# Patient Record
Sex: Male | Born: 1965 | Race: White | Hispanic: No | Marital: Married | Smoking: Former smoker
Health system: Southern US, Community
[De-identification: ages and names within clinical notes are randomized; demographics above are authoritative.]

## PROBLEM LIST (undated history)

## (undated) DIAGNOSIS — N433 Hydrocele, unspecified: Secondary | ICD-10-CM

## (undated) DIAGNOSIS — Z8719 Personal history of other diseases of the digestive system: Secondary | ICD-10-CM

## (undated) DIAGNOSIS — K449 Diaphragmatic hernia without obstruction or gangrene: Secondary | ICD-10-CM

## (undated) DIAGNOSIS — I7300 Raynaud's syndrome without gangrene: Secondary | ICD-10-CM

## (undated) DIAGNOSIS — Z9889 Other specified postprocedural states: Secondary | ICD-10-CM

## (undated) DIAGNOSIS — K219 Gastro-esophageal reflux disease without esophagitis: Secondary | ICD-10-CM

## (undated) DIAGNOSIS — C221 Intrahepatic bile duct carcinoma: Secondary | ICD-10-CM

## (undated) DIAGNOSIS — I10 Essential (primary) hypertension: Secondary | ICD-10-CM

## (undated) HISTORY — PX: INGUINAL HERNIA REPAIR: SUR1180

---

## 2002-01-01 ENCOUNTER — Encounter: Payer: Self-pay | Admitting: Family Medicine

## 2002-01-01 ENCOUNTER — Encounter: Admission: RE | Admit: 2002-01-01 | Discharge: 2002-01-01 | Payer: Self-pay | Admitting: Family Medicine

## 2003-11-21 ENCOUNTER — Encounter (INDEPENDENT_AMBULATORY_CARE_PROVIDER_SITE_OTHER): Payer: Self-pay | Admitting: Specialist

## 2003-11-21 ENCOUNTER — Ambulatory Visit (HOSPITAL_COMMUNITY): Admission: RE | Admit: 2003-11-21 | Discharge: 2003-11-21 | Payer: Self-pay | Admitting: *Deleted

## 2004-07-09 ENCOUNTER — Encounter (INDEPENDENT_AMBULATORY_CARE_PROVIDER_SITE_OTHER): Payer: Self-pay | Admitting: Specialist

## 2004-07-09 ENCOUNTER — Ambulatory Visit (HOSPITAL_COMMUNITY): Admission: RE | Admit: 2004-07-09 | Discharge: 2004-07-09 | Payer: Self-pay | Admitting: *Deleted

## 2005-02-04 ENCOUNTER — Ambulatory Visit (HOSPITAL_COMMUNITY): Admission: RE | Admit: 2005-02-04 | Discharge: 2005-02-04 | Payer: Self-pay | Admitting: *Deleted

## 2005-02-04 ENCOUNTER — Encounter (INDEPENDENT_AMBULATORY_CARE_PROVIDER_SITE_OTHER): Payer: Self-pay | Admitting: Specialist

## 2006-06-09 ENCOUNTER — Ambulatory Visit (HOSPITAL_COMMUNITY): Admission: RE | Admit: 2006-06-09 | Discharge: 2006-06-09 | Payer: Self-pay | Admitting: *Deleted

## 2007-08-09 ENCOUNTER — Ambulatory Visit (HOSPITAL_COMMUNITY): Admission: RE | Admit: 2007-08-09 | Discharge: 2007-08-09 | Payer: Self-pay | Admitting: *Deleted

## 2007-08-09 ENCOUNTER — Encounter (INDEPENDENT_AMBULATORY_CARE_PROVIDER_SITE_OTHER): Payer: Self-pay | Admitting: *Deleted

## 2010-07-13 NOTE — Op Note (Signed)
NAMEPANFILO, Jeffrey Blevins                  ACCOUNT NO.:  [id number]   MEDICAL RECORD NO.:  [id number]          PATIENT TYPE:  AMB   LOCATION:  ENDO                         FACILITY:  [facility]   PHYSICIAN:  Georgiana Spinner, M.D.    DATE OF BIRTH:  25-Jan-1966   DATE OF PROCEDURE:  08/09/2007  DATE OF DISCHARGE:                               OPERATIVE REPORT   PROCEDURE:  Upper endoscopy with dilation and biopsy.   ENDOSCOPIST:  Georgiana Spinner, M.D.   INDICATIONS:  Dysphagia with previous history of esophageal stricture.   ANESTHESIA:  Fentanyl 125 mcg, Versed 12.5 mg, Benadryl 25 mg.   PROCEDURE:  With the patient mildly sedated in the left lateral  decubitus position, the Pentax videoscopic endoscope was inserted into  the mouth and passed under direct vision through the esophagus, which  appeared normal, until we reached the distal esophagus and there was a  clear-cut Barrett's, which was photographed, and advanced into the  stomach; fundus, body, antrum, duodenal bulb, and second portion of  duodenum were visualized.  From this point, the endoscope was slowly  withdrawn, taking circumferential views of duodenal mucosa until the  endoscope had been pulled back into the stomach and placed in  retroflexion to view the stomach from below.  The endoscope was  straightened and a guidewire was passed.  The endoscope was withdrawn.  Subsequently, a 17 Savary dilator was passed rather easily and then  removed with the guidewire.  The endoscope was reinserted.  A small  amount of blood was seen in the distal esophagus, indicating successful  dilation, and biopsies were taken as best we could; there was some blood  and the patient was only mildly sedated.  The endoscope was then  withdrawn.  The patient's vital signs and pulse oximetry remained  stable.  The patient tolerated the procedure well without apparent  complications.   FINDINGS:  Barrett's esophagus with esophageal narrowing, dilated to 17  Savary, biopsies taken.   PLAN:  Await biopsy reports and clinical response.  The patient will  call me for results and follow up with me as needed as an outpatient.           ______________________________  Georgiana Spinner, M.D.     GMO/MEDQ  D:  08/09/2007  T:  08/09/2007  Job:  [id number]

## 2010-07-16 NOTE — Op Note (Signed)
Jeffrey Blevins, Jeffrey Blevins                  ACCOUNT NO.:  [id number]   MEDICAL RECORD NO.:  [id number]          PATIENT TYPE:  AMB   LOCATION:  ENDO                         FACILITY:  [facility]   PHYSICIAN:  Georgiana Spinner, M.D.    DATE OF BIRTH:  1965/10/13   DATE OF PROCEDURE:  02/04/2005  DATE OF DISCHARGE:                                 OPERATIVE REPORT   PROCEDURE:  Upper endoscopy with biopsy.   INDICATIONS:  GERD with Barrett's esophagus.   ANESTHESIA:  Demerol 100, Versed 9 mg.   PROCEDURE:  With patient mildly sedated in the left lateral decubitus  position, the Olympus videoscopic endoscope was inserted in the mouth,  passed under direct vision through the esophagus which appeared normal until  we reached the distal esophagus, and there appeared to be a small area of  Barrett's, photographed and biopsied. We entered the stomach. Fundus, body,  antrum, duodenal bulb, second portion of duodenum all appeared normal. From  this point, the endoscope was slowly withdrawn taking circumferential views  of the duodenal mucosa until the endoscope had been pulled back into the  stomach and placed in retroflexion to view the stomach from below. The  endoscope was straightened and withdrawn taking circumferential views of the  remaining gastric and esophageal mucosa. The patient's vital signs and pulse  oximeter remained stable. The patient tolerated the procedure well without  apparent complications.   FINDINGS:  Barrett's esophagus, biopsied, await biopsy report. The patient  will call me for results and follow up with me as an outpatient.           ______________________________  Georgiana Spinner, M.D.     GMO/MEDQ  D:  02/04/2005  T:  02/04/2005  Job:  [id number]   cc:   Delaney Meigs, M.D.  Fax: [phone]

## 2010-07-16 NOTE — Op Note (Signed)
NAMEGIORDANO, Jeffrey Blevins                  ACCOUNT NO.:  [id number]   MEDICAL RECORD NO.:  [id number]          PATIENT TYPE:  AMB   LOCATION:  ENDO                         [facility]ACILITY[facility]   PHYSICIAN:  Georgiana Spinner, M.D.    DATE OF BIRTH:  November 26, 1965   DATE OF PROCEDURE:  DATE OF DISCHARGE:                                 OPERATIVE REPORT   PROCEDURE:  Upper endoscopy with biopsy.   INDICATIONS:  Barrett's esophagus, question of low grade glandular  dysplasia.   ANESTHESIA:  Demerol 90, Versed 9 mg.   DESCRIPTION OF PROCEDURE:  With the patient mildly sedated in the left  lateral decubitus position, the Olympus videoscopic endoscope was inserted  in the mouth and passed under direct vision through the esophagus and there  were areas of Barrett's photographed and biopsied. We then entered into the  stomach fundus, body, antrum, duodenal bulb, and second portion of the  duodenum were visualized. From this point, the endoscope was slowly  withdrawn taking circumferential views of duodenal mucosa until the  endoscope was then pulled back into the stomach placed in retroflexion to  view the stomach from below. The endoscope was straightened and withdrawn  taking circumferential views of the remaining gastric and esophageal mucosa.  The patient's vital signs and pulse oximeter remained stable. The patient  tolerated procedure well without complications.   FINDINGS:  Barrett's esophagus biopsied, incomplete wrap of the GE junction  around the endoscope indicating the laxity of the gastroesophageal junction  which leads to his reflux.   PLAN:  Await biopsy report. The patient will call me for results and follow-  up with me as an outpatient      GMO/MEDQ  D:  07/09/2004  T:  07/09/2004  Job:  [id number]   cc:   Delaney Meigs, M.D.  [address]  [place]  [place] [address]  Fax: [phone]

## 2010-07-16 NOTE — Op Note (Signed)
Jeffrey Blevins, Jeffrey Blevins                  ACCOUNT NO.:  [id number]   MEDICAL RECORD NO.:  [id number]          PATIENT TYPE:  AMB   LOCATION:  ENDO                         FACILITY:  [facility]   PHYSICIAN:  Georgiana Spinner, M.D.    DATE OF BIRTH:  01-20-1966   DATE OF PROCEDURE:  11/21/2003  DATE OF DISCHARGE:                                 OPERATIVE REPORT   PROCEDURE:  Upper endoscopy with biopsy and dilation.   INDICATIONS:  Dysphagia.   ANESTHESIA:  1.  Demerol 120 mg.  2.  Versed 12 mg.   DESCRIPTION OF PROCEDURE:  With patient mildly sedated in the left lateral  decubitus position, the Olympus videoscopic endoscope was inserted in the  mouth, passed under direct vision through the esophagus, which appeared  normal except for an irregular Z-line which we photographed and biopsied.  We entered into the stomach.  Fundus, body appeared normal.  Antrum showed  changes of possible erythema and some irregularity of the mucosa which was  photographed and biopsied.  Duodenal bulb and second portion of duodenum  appeared normal.  From this point, the endoscope was slowly withdrawn,  taking circumferential views of the duodenal mucosa until the endoscope then  pulled back into the stomach, placed in retroflexion to view the stomach  from below, and this showed a weak wrap of the GE junction around the  endoscope.  The endoscope was then straightened, and a guidewire was passed.  The endoscope was withdrawn.  Subsequently Savary dilators of 15 and 17 were  passed rather easily.  No blood was seen on either dilator.  With the  latter, the guidewire was removed.  The endoscope was reinserted and then  after biopsy again in the distal stomach and esophagus, the endoscope was  withdrawn, taking circumferential views of the remaining gastric and  esophageal mucosa.  The patient's vital signs and pulse oximeter remained  stable.  The patient tolerated the procedure well without apparent   complications.   FINDINGS:  Question of Barrett's esophagus, esophageal stricture dilated.   PLAN:  Await clinical response and biopsy report.  The patient will call me  for results and follow up with me as an outpatient.     GMO/MEDQ  D:  11/21/2003  T:  11/21/2003  Job:  [id number]   cc:   Delaney Meigs, M.D.  [address]  [place]  [place] [address]  Fax: [phone]

## 2011-12-07 ENCOUNTER — Other Ambulatory Visit: Payer: Self-pay | Admitting: Internal Medicine

## 2011-12-07 ENCOUNTER — Ambulatory Visit
Admission: RE | Admit: 2011-12-07 | Discharge: 2011-12-07 | Disposition: A | Discharge status: Home / Self Care | Payer: 59 | Source: Ambulatory Visit | Attending: Internal Medicine | Admitting: Internal Medicine

## 2019-07-16 HISTORY — PX: COLONOSCOPY WITH ESOPHAGOGASTRODUODENOSCOPY (EGD): SHX5779

## 2019-07-16 LAB — HM COLONOSCOPY

## 2020-09-16 ENCOUNTER — Other Ambulatory Visit: Payer: Self-pay | Admitting: Urology

## 2020-10-05 ENCOUNTER — Encounter (HOSPITAL_BASED_OUTPATIENT_CLINIC_OR_DEPARTMENT_OTHER): Payer: Self-pay | Admitting: Urology

## 2020-10-06 ENCOUNTER — Other Ambulatory Visit: Payer: Self-pay

## 2020-10-06 ENCOUNTER — Encounter (HOSPITAL_BASED_OUTPATIENT_CLINIC_OR_DEPARTMENT_OTHER): Payer: Self-pay | Admitting: Urology

## 2020-10-06 NOTE — Progress Notes (Signed)
Spoke w/ via phone for pre-op interview--- pt Lab needs dos----   Istat and EKG            Lab results------ no COVID test -----patient states asymptomatic no test needed Arrive at ------- 1030 on 10-09-2020 NPO after MN NO Solid Food.  Clear liquids from MN until--- 0930 Med rec completed Medications to take morning of surgery ----- Norvasc, Nexium Diabetic medication ----- n/a Patient instructed no nail polish to be worn day of surgery Patient instructed to bring photo id and insurance card day of surgery Patient aware to have Driver (ride ) / caregiver for 24 hours after surgery --wife, [facility] Patient Special Instructions ----- n/a Pre-Op special Istructions ----- n/a Patient verbalized understanding of instructions that were given at this phone interview. Patient denies shortness of breath, chest pain, fever, cough at this phone interview.

## 2020-10-08 NOTE — H&P (Signed)
Office Visit Report     09/07/2020   --------------------------------------------------------------------------------   Jeffrey Blevins. [place]  MRN: [id number]  DOB: November 14, 1965, 55 year old Male  SSN: [id number]   PRIMARY CARE:  Merrilee Seashore, MD  REFERRING:  Irene Pap, DO  PROVIDER:  Festus Aloe, M.D.  LOCATION:  [facility] [phone]     --------------------------------------------------------------------------------   CC/HPI: New pt -   1) right spermatocele poss hydrocele - pt was seen in 2013 when [place] was thought to reveal a 2.7 cm right spermatocele. This has slowly enlarged and now on [place] at [place] 07/06/2020 reports says a large right "hydrocele". It is botersome heavy and "pulls". It buries the penis some and makes voiding difficult. AUASS = 7.    2) peyronie's - penis curves up. Since 2020. Erection non-tender. He has noticed a plaque on the top. ALso has ED. He has trouble getting and maintaining erection. Never tried anything.    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     ALLERGIES: None   MEDICATIONS: Nexium 20 mg capsule,delayed release  Amlodipine Besylate 10 mg tablet  Ibuprofen  Irbesartan 150 mg tablet     GU PSH: None     PSH Notes: Hernia Repair   NON-GU PSH: Hernia Repair - 2013     GU PMH: Spermatocele of epididymis, Unspec, Spermatocele - 2015      PMH Notes:  2011-12-15 11:13:48 - Note: Arthritis   NON-GU PMH: Barrett''s esophagus without dysplasia, Barrett's Esophagus - 2014 Personal history of other diseases of the digestive system, History of esophageal reflux - 2014 Encounter for general adult medical examination without abnormal findings, Encounter for preventive health examination Hypertension    FAMILY HISTORY: Breast Cancer - Mother Death of family member - Mother, Father Diabetes - Runs In Family Heart Disease - Runs In Family stroke - Father    Notes: 2 sons   SOCIAL HISTORY: Marital Status: Married Preferred  Language: English; Ethnicity: Not Hispanic Or Latino; Race: White Current Smoking Status: Patient smokes. Has smoked since 08/29/2010. Smokes less than 1/2 pack per day.   Tobacco Use Assessment Completed: Used Tobacco in last 30 days? Does not use smokeless tobacco. Drinks 3 drinks per day.  Drinks 2 caffeinated drinks per day.     Notes: Former smoker, Alcohol Use, Caffeine Use   REVIEW OF SYSTEMS:    GU Review Male:   Patient reports get up at night to urinate and erection problems. Patient denies frequent urination, hard to postpone urination, burning/ pain with urination, leakage of urine, stream starts and stops, trouble starting your stream, have to strain to urinate , and penile pain.  Gastrointestinal (Upper):   Patient reports indigestion/ heartburn. Patient denies nausea and vomiting.  Gastrointestinal (Lower):   Patient denies diarrhea and constipation.  Constitutional:   Patient reports fatigue. Patient denies fever, night sweats, and weight loss.  Skin:   Patient reports skin rash/ lesion and itching.   Eyes:   Patient reports blurred vision. Patient denies double vision.  Ears/ Nose/ Throat:   Patient denies sore throat and sinus problems.  Hematologic/Lymphatic:   Patient denies swollen glands and easy bruising.  Cardiovascular:   Patient denies leg swelling and chest pains.  Respiratory:   Patient denies cough and shortness of breath.  Endocrine:   Patient denies excessive thirst.  Musculoskeletal:   Patient reports back pain and joint pain.   Neurological:   Patient denies headaches and dizziness.  Psychologic:   Patient  denies depression and anxiety.   VITAL SIGNS:      09/07/2020 02:46 PM  Weight 215 lb / 97.52 kg  Height 74 in / 187.96 cm  BP 165/89 mmHg  Pulse 60 /min  Temperature 97.5 F / 36.3 C  BMI 27.6 kg/m   GU PHYSICAL EXAMINATION:    Anus and Perineum: No hemorrhoids. No anal stenosis. No rectal fissure, no anal fissure. No edema, no dimple, no  perineal tenderness, no anal tenderness.  Scrotum: No lesions. No edema. No cysts. No warts.  Epididymides: Right: no spermatocele, no masses, no cysts, no tenderness, no induration, no enlargement. Left: no spermatocele, no masses, no cysts, no tenderness, no induration, no enlargement.  Testes: 5+ cm hydrocele right testis. No tenderness, no swelling, no enlargement left testis. No tenderness, no swelling, no enlargement right testis. Normal location left testis. Normal location right testis. No mass, no cyst, no varicocele, no hydrocele left testis. No mass, no cyst, no varicocele right testis.   Urethral Meatus: Normal size. No lesion, no wart, no discharge, no polyp. Normal location.  Penis: Circumcised, no warts, no cracks. No dorsal Peyronie's plaques, no left corporal Peyronie's plaques, no right corporal Peyronie's plaques, no scarring, no warts. No balanitis, no meatal stenosis.  Prostate: Prostate about 30 grams. Left lobe normal consistency, right lobe normal consistency. Symmetrical lobes. No prostate nodule. Left lobe no tenderness, right lobe no tenderness.   Seminal Vesicles: Nonpalpable.  Sphincter Tone: Normal sphincter. No rectal tenderness. No rectal mass.    MULTI-SYSTEM PHYSICAL EXAMINATION:    Constitutional: Well-nourished. No physical deformities. Normally developed. Good grooming.  Neck: Neck symmetrical, not swollen. Normal tracheal position.  Respiratory: No labored breathing, no use of accessory muscles.   Cardiovascular: Normal temperature, normal extremity pulses, no swelling, no varicosities.  Lymphatic: No enlargement of neck, axillae, groin.  Skin: No paleness, no jaundice, no cyanosis. No lesion, no ulcer, no rash.  Neurologic / Psychiatric: Oriented to time, oriented to place, oriented to person. No depression, no anxiety, no agitation.  Gastrointestinal: No mass, no tenderness, no rigidity, non obese abdomen.  Eyes: Normal conjunctivae. Normal eyelids.  Ears,  Nose, Mouth, and Throat: Left ear no scars, no lesions, no masses. Right ear no scars, no lesions, no masses. Nose no scars, no lesions, no masses. Normal hearing. Normal lips.  Musculoskeletal: Normal gait and station of head and neck.     Complexity of Data:  X-Ray Review: Scrotal Ultrasound: Reviewed Report. Discussed With Patient. 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 Discussed nature r/b/a to surgical repair and he elects to proceed.   2   ED due to arterial insufficiency - N52.01 Chronic, Stable - disc nature r/b/a to pde5i and he will start sildenafil.   3   Peyronies Disease - N48.6 Chronic, Stable - disc nature r/b/a to xiaflex and he'll consider    PLAN:            Medications New Meds: Sildenafil Citrate 20 mg tablet take 1 -5 tablets po about 1 hour before sexual activity   #30  3 Refill(s)            Schedule Return Visit/Planned Activity: Next Available Appointment - Schedule Surgery          Document Letter(s):  Created for Patient: Clinical  Summary         Notes:   cc: Dr. Ashby Dawes     * Signed by Festus Aloe, M.D. on 09/08/20 at 10:12 PM (EDT)*     The information contained in this medical record document is considered private and confidential patient information. This information can only be used for the medical diagnosis and/or medical services that are being provided by the patient's selected caregivers. This information can only be distributed outside of the patient's care if the patient agrees and signs waivers of authorization for this information to be sent to an outside source or route.

## 2020-10-09 ENCOUNTER — Ambulatory Visit (HOSPITAL_BASED_OUTPATIENT_CLINIC_OR_DEPARTMENT_OTHER): Payer: Managed Care, Other (non HMO) | Admitting: Certified Registered Nurse Anesthetist

## 2020-10-09 ENCOUNTER — Encounter (HOSPITAL_BASED_OUTPATIENT_CLINIC_OR_DEPARTMENT_OTHER)
Admission: RE | Disposition: A | Discharge status: Home / Self Care | Payer: Self-pay | Source: Home / Self Care | Attending: Urology

## 2020-10-09 ENCOUNTER — Encounter (HOSPITAL_BASED_OUTPATIENT_CLINIC_OR_DEPARTMENT_OTHER): Payer: Self-pay | Admitting: Urology

## 2020-10-09 ENCOUNTER — Other Ambulatory Visit: Payer: Self-pay

## 2020-10-09 ENCOUNTER — Ambulatory Visit (HOSPITAL_BASED_OUTPATIENT_CLINIC_OR_DEPARTMENT_OTHER)
Admission: RE | Admit: 2020-10-09 | Discharge: 2020-10-09 | Disposition: A | Discharge status: Home / Self Care | Payer: Managed Care, Other (non HMO) | Attending: Urology | Admitting: Urology

## 2020-10-09 DIAGNOSIS — F17200 Nicotine dependence, unspecified, uncomplicated: Secondary | ICD-10-CM | POA: Diagnosis not present

## 2020-10-09 DIAGNOSIS — N4342 Spermatocele of epididymis, multiple: Secondary | ICD-10-CM

## 2020-10-09 DIAGNOSIS — K219 Gastro-esophageal reflux disease without esophagitis: Secondary | ICD-10-CM | POA: Diagnosis not present

## 2020-10-09 DIAGNOSIS — Z79899 Other long term (current) drug therapy: Secondary | ICD-10-CM | POA: Diagnosis not present

## 2020-10-09 DIAGNOSIS — Z833 Family history of diabetes mellitus: Secondary | ICD-10-CM | POA: Diagnosis not present

## 2020-10-09 DIAGNOSIS — Z803 Family history of malignant neoplasm of breast: Secondary | ICD-10-CM | POA: Insufficient documentation

## 2020-10-09 DIAGNOSIS — N4341 Spermatocele of epididymis, single: Secondary | ICD-10-CM | POA: Insufficient documentation

## 2020-10-09 DIAGNOSIS — Z8249 Family history of ischemic heart disease and other diseases of the circulatory system: Secondary | ICD-10-CM | POA: Insufficient documentation

## 2020-10-09 DIAGNOSIS — Z791 Long term (current) use of non-steroidal anti-inflammatories (NSAID): Secondary | ICD-10-CM | POA: Diagnosis not present

## 2020-10-09 HISTORY — DX: Essential (primary) hypertension: I10

## 2020-10-09 HISTORY — DX: Hydrocele, unspecified: N43.3

## 2020-10-09 HISTORY — DX: Personal history of other diseases of the digestive system: Z87.19

## 2020-10-09 HISTORY — DX: Gastro-esophageal reflux disease without esophagitis: K21.9

## 2020-10-09 HISTORY — DX: Diaphragmatic hernia without obstruction or gangrene: K44.9

## 2020-10-09 HISTORY — DX: Other specified postprocedural states: Z98.890

## 2020-10-09 HISTORY — DX: Raynaud's syndrome without gangrene: I73.00

## 2020-10-09 HISTORY — PX: SPERMATOCELECTOMY: SHX2420

## 2020-10-09 LAB — POCT I-STAT, CHEM 8
BUN: 15 mg/dL (ref 6–20)
Calcium, Ion: 1.24 mmol/L (ref 1.15–1.40)
Chloride: 106 mmol/L (ref 98–111)
Creatinine, Ser: 1 mg/dL (ref 0.61–1.24)
Glucose, Bld: 91 mg/dL (ref 70–99)
HCT: 45 % (ref 39.0–52.0)
Hemoglobin: 15.3 g/dL (ref 13.0–17.0)
Potassium: 4 mmol/L (ref 3.5–5.1)
Sodium: 140 mmol/L (ref 135–145)
TCO2: 24 mmol/L (ref 22–32)

## 2020-10-09 SURGERY — EXCISION, SPERMATOCELE
Anesthesia: General | Site: Scrotum | Laterality: Right

## 2020-10-09 MED ORDER — ACETAMINOPHEN 160 MG/5ML PO SOLN: 325.0000 mg | ORAL | Status: DC | PRN

## 2020-10-09 MED ORDER — OXYCODONE HCL 5 MG PO TABS: 5.0000 mg | ORAL_TABLET | Freq: Once | ORAL | Status: DC | PRN

## 2020-10-09 MED ORDER — DEXAMETHASONE SODIUM PHOSPHATE 10 MG/ML IJ SOLN
INTRAMUSCULAR | Status: AC
  Filled 2020-10-09: qty 1

## 2020-10-09 MED ORDER — CEFAZOLIN SODIUM-DEXTROSE 2-4 GM/100ML-% IV SOLN
INTRAVENOUS | Status: AC
  Filled 2020-10-09: qty 100

## 2020-10-09 MED ORDER — TRAMADOL HCL 50 MG PO TABS: 50.0000 mg | ORAL_TABLET | Freq: Four times a day (QID) | ORAL | 0 refills | Status: AC | PRN

## 2020-10-09 MED ORDER — BACITRACIN ZINC 500 UNIT/GM EX OINT
TOPICAL_OINTMENT | CUTANEOUS | Status: DC | PRN
  Administered 2020-10-09: 1 via TOPICAL

## 2020-10-09 MED ORDER — EPHEDRINE SULFATE 50 MG/ML IJ SOLN
INTRAMUSCULAR | Status: DC | PRN
  Administered 2020-10-09: 10 mg via INTRAVENOUS

## 2020-10-09 MED ORDER — 0.9 % SODIUM CHLORIDE (POUR BTL) OPTIME
TOPICAL | Status: DC | PRN
  Administered 2020-10-09: 500 mL

## 2020-10-09 MED ORDER — LIDOCAINE HCL (PF) 2 % IJ SOLN
INTRAMUSCULAR | Status: AC
  Filled 2020-10-09: qty 5

## 2020-10-09 MED ORDER — PROPOFOL 10 MG/ML IV BOLUS
INTRAVENOUS | Status: AC
  Filled 2020-10-09: qty 20

## 2020-10-09 MED ORDER — ONDANSETRON HCL 4 MG/2ML IJ SOLN
INTRAMUSCULAR | Status: DC | PRN
  Administered 2020-10-09: 4 mg via INTRAVENOUS

## 2020-10-09 MED ORDER — FENTANYL CITRATE (PF) 100 MCG/2ML IJ SOLN
INTRAMUSCULAR | Status: DC | PRN
  Administered 2020-10-09 (×2): 50 ug via INTRAVENOUS

## 2020-10-09 MED ORDER — CEFAZOLIN SODIUM-DEXTROSE 2-4 GM/100ML-% IV SOLN
2.0000 g | INTRAVENOUS | Status: AC
  Administered 2020-10-09: 2 g via INTRAVENOUS

## 2020-10-09 MED ORDER — AMISULPRIDE (ANTIEMETIC) 5 MG/2ML IV SOLN
10.0000 mg | Freq: Once | INTRAVENOUS | Status: DC | PRN
Start: 2020-10-09 — End: 2020-10-09

## 2020-10-09 MED ORDER — MIDAZOLAM HCL 2 MG/2ML IJ SOLN
INTRAMUSCULAR | Status: AC
  Filled 2020-10-09: qty 2

## 2020-10-09 MED ORDER — PROPOFOL 10 MG/ML IV BOLUS
INTRAVENOUS | Status: DC | PRN
Start: 2020-10-09 — End: 2020-10-09
  Administered 2020-10-09: 150 mg via INTRAVENOUS

## 2020-10-09 MED ORDER — FENTANYL CITRATE (PF) 100 MCG/2ML IJ SOLN: 25.0000 ug | INTRAMUSCULAR | Status: DC | PRN

## 2020-10-09 MED ORDER — LIDOCAINE HCL (PF) 1 % IJ SOLN
INTRAMUSCULAR | Status: DC | PRN
  Administered 2020-10-09: 3.5 mL

## 2020-10-09 MED ORDER — ACETAMINOPHEN 10 MG/ML IV SOLN: 1000.0000 mg | Freq: Once | INTRAVENOUS | Status: DC | PRN

## 2020-10-09 MED ORDER — ONDANSETRON HCL 4 MG/2ML IJ SOLN
INTRAMUSCULAR | Status: AC
  Filled 2020-10-09: qty 2

## 2020-10-09 MED ORDER — GLYCOPYRROLATE PF 0.2 MG/ML IJ SOSY
PREFILLED_SYRINGE | INTRAMUSCULAR | Status: AC
  Filled 2020-10-09: qty 1

## 2020-10-09 MED ORDER — LIDOCAINE HCL (CARDIAC) PF 100 MG/5ML IV SOSY
PREFILLED_SYRINGE | INTRAVENOUS | Status: DC | PRN
Start: 2020-10-09 — End: 2020-10-09
  Administered 2020-10-09: 50 mg via INTRAVENOUS

## 2020-10-09 MED ORDER — LACTATED RINGERS IV SOLN: INTRAVENOUS | Status: DC

## 2020-10-09 MED ORDER — ACETAMINOPHEN 325 MG PO TABS: 325.0000 mg | ORAL_TABLET | ORAL | Status: DC | PRN

## 2020-10-09 MED ORDER — DEXAMETHASONE SODIUM PHOSPHATE 4 MG/ML IJ SOLN
INTRAMUSCULAR | Status: DC | PRN
  Administered 2020-10-09: 10 mg via INTRAVENOUS

## 2020-10-09 MED ORDER — MIDAZOLAM HCL 5 MG/5ML IJ SOLN
INTRAMUSCULAR | Status: DC | PRN
  Administered 2020-10-09: 2 mg via INTRAVENOUS

## 2020-10-09 MED ORDER — OXYCODONE HCL 5 MG/5ML PO SOLN: 5.0000 mg | Freq: Once | ORAL | Status: DC | PRN

## 2020-10-09 MED ORDER — EPHEDRINE 5 MG/ML INJ
INTRAVENOUS | Status: AC
  Filled 2020-10-09: qty 5

## 2020-10-09 MED ORDER — FENTANYL CITRATE (PF) 100 MCG/2ML IJ SOLN
INTRAMUSCULAR | Status: AC
  Filled 2020-10-09: qty 2

## 2020-10-09 MED ORDER — PROMETHAZINE HCL 25 MG/ML IJ SOLN: 6.2500 mg | INTRAMUSCULAR | Status: DC | PRN

## 2020-10-09 MED ORDER — BUPIVACAINE-EPINEPHRINE 0.5% -1:200000 IJ SOLN
INTRAMUSCULAR | Status: DC | PRN
  Administered 2020-10-09: 3.5 mL
  Administered 2020-10-09: 10 mL

## 2020-10-09 SURGICAL SUPPLY — 32 items
BLADE SURG 15 STRL LF DISP TIS (BLADE) ×2 IMPLANT
BLADE SURG 15 STRL SS (BLADE) ×3
BNDG GAUZE ELAST 4 BULKY (GAUZE/BANDAGES/DRESSINGS) ×3 IMPLANT
COVER BACK TABLE 60X90IN (DRAPES) ×3 IMPLANT
COVER MAYO STAND STRL (DRAPES) ×3 IMPLANT
DRAPE LAPAROTOMY 100X72 PEDS (DRAPES) ×3 IMPLANT
DRSG TELFA 3X8 NADH (GAUZE/BANDAGES/DRESSINGS) ×3 IMPLANT
ELECT REM PT RETURN 9FT ADLT (ELECTROSURGICAL) ×3
ELECTRODE REM PT RTRN 9FT ADLT (ELECTROSURGICAL) ×2 IMPLANT
GAUZE 4X4 16PLY ~~LOC~~+RFID DBL (SPONGE) ×3 IMPLANT
GLOVE SURG ENC TEXT LTX SZ7.5 (GLOVE) ×6 IMPLANT
GLOVE SURG UNDER POLY LF SZ6.5 (GLOVE) ×3 IMPLANT
GLOVE SURG UNDER POLY LF SZ7 (GLOVE) ×6 IMPLANT
GOWN STRL REUS W/TWL LRG LVL3 (GOWN DISPOSABLE) ×9 IMPLANT
GOWN STRL REUS W/TWL XL LVL3 (GOWN DISPOSABLE) ×3 IMPLANT
KIT TURNOVER CYSTO (KITS) ×3 IMPLANT
NEEDLE HYPO 22GX1.5 SAFETY (NEEDLE) ×3 IMPLANT
NS IRRIG 500ML POUR BTL (IV SOLUTION) ×3 IMPLANT
PACK BASIN DAY SURGERY FS (CUSTOM PROCEDURE TRAY) ×3 IMPLANT
PENCIL SMOKE EVACUATOR (MISCELLANEOUS) ×3 IMPLANT
SPONGE T-LAP 4X18 ~~LOC~~+RFID (SPONGE) ×3 IMPLANT
SUPPORTER ATHLETIC XL (MISCELLANEOUS) ×3
SUPPORTER ATHLETIC XL 3X44-50X (MISCELLANEOUS) ×2 IMPLANT
SUT CHROMIC 3 0 SH 27 (SUTURE) ×3 IMPLANT
SUT VIC AB 2-0 SH 27 (SUTURE) ×6
SUT VIC AB 2-0 SH 27XBRD (SUTURE) ×4 IMPLANT
SYR BULB IRRIG 60ML STRL (SYRINGE) ×3 IMPLANT
SYR CONTROL 10ML LL (SYRINGE) ×3 IMPLANT
TOWEL OR 17X26 10 PK STRL BLUE (TOWEL DISPOSABLE) ×3 IMPLANT
TRAY DSU PREP LF (CUSTOM PROCEDURE TRAY) ×3 IMPLANT
TUBE CONNECTING 12X1/4 (SUCTIONS) ×3 IMPLANT
YANKAUER SUCT BULB TIP NO VENT (SUCTIONS) ×3 IMPLANT

## 2020-10-09 NOTE — Op Note (Addendum)
Preoperative Diagnosis: Right Hydrocele vs Spermatocele  Postoperative Diagnosis:  Right Spermatocele  Procedure(s) Performed:  1. Right Spermatocelectomy  Surgeon:  Festus Aloe, MD  Resident Surgeon:  Bishop Limbo, MD  Anesthesia:  General   Fluids:  See anesthesia record  Estimated blood loss:  5 mL  Specimens:  right spermatocele sac  Cultures:  None  Drains:  None   Complications:  None   Indications: This is a 55 y.o. patient with history of right spermatocele versus hydrocele that has been enlarging and becoming increasingly bothersome to the patient. After discussion of the risks & benefits and alternatives to surgical approach, the patient wishes to proceed with right hydrocelectomy versus spermatocelectomy.    Findings: Right spermatocele containing 300cc serous clear fluid. Disseected free off of the right testicle and epididymis. Right epididymis reapparoixmated to superior testicle. No drain left in place.   Description:  The patient was correctly identified in the preop holding area where written informed consent as well potential risk and complication reviewed. The patient agreed and was brought back to the operative suite where a preinduction timeout was performed. Once correct information was verified, general anesthesia was induced . The patient was then gently placed into supine position with SCDs in place for VTE prophylaxis. They were prepped and draped in the usual sterile fashion and given appropriate preoperative antibiotics with ancef. A second timeout was then performed.   A 5cm right hemi-scrotal incision was made after instillation of marcaine with epi/lidocaine plain. Using a combination of blunt dissection and Bovie electrocautery we dissected through the dartos layer until the right hydrocele and testicle were delivered. The remainder of the surrounding fascia was bluntly dissected off of the hydrocele sac. The tunica vaginalis (hydrocele sac) was  opened which exposed a spermatocele. The spermatocele was attached at the head of the testicle and was carefully dissected off of the testicle, epididymis and the spermatic cord posteriorly using right angle and bovie electrocautery take achieve hemostasis along the way. Once the spermatocele was dissected free, it was drained with drainage of approximately 300cc fluid. The tunica vaginalis edges were fulgurated for hemostasis. The epidymis was reapproximated to the testicle using a 2-0 vicryl running suture. The remaining hydrocele sac was everted over the cord with running 2-0 vicryl using the Jabouley technique. The remainder of the surgical field was thoroughly inspected and meticulous hemostasis was attained. The surgical field was irrigated. The right testicle was replaced into the scrotum in the correct anatomic position. We elected not to leave a surgical drain due to excellent hemostasis. About 4 cc marcaine with epi/lidocaine plain was injected around the cord and about 5 more cc along the skin edges. The dartos layer was closed with running 3-0 chromic. The scrotal skin was closed with running 3-0 chromic in horizontal mattress fashion. Bacitracin was applied. The scrotum was dressed with telfa, fluff gauze and scrotal support. The patient was woken up from the procedure and taken to the recovery unit for routine postoperative care. All surgical counts were correct x2.    Post Op Plan:   1. Discharge patient home when meets PACU criteria . 2. Prn analgesics 3. Scrotal support 4. Follow up arranged two weeks post op visit  I was present and scrubbed for the entire procedure.

## 2020-10-09 NOTE — Anesthesia Postprocedure Evaluation (Signed)
Anesthesia Post Note  Patient: Jeffrey Blevins  Procedure(s) Performed: SPERMATOCELECTOMY (Right: Scrotum)     Patient location during evaluation: PACU Anesthesia Type: General Level of consciousness: awake and alert Pain management: pain level controlled Vital Signs Assessment: post-procedure vital signs reviewed and stable Respiratory status: spontaneous breathing, nonlabored ventilation, respiratory function stable and patient connected to nasal cannula oxygen Cardiovascular status: blood pressure returned to baseline and stable Postop Assessment: no apparent nausea or vomiting Anesthetic complications: no   No notable events documented.  Last Vitals:  Vitals:   10/09/20 1415 10/09/20 1430  BP: (!) 146/94 125/67  Pulse: 69 67  Resp: 10 14  Temp:  36.4 C  SpO2: 100% 100%    Last Pain:  Vitals:   10/09/20 1430  TempSrc:   PainSc: 0-No pain                 Effie Berkshire

## 2020-10-09 NOTE — Interval H&P Note (Signed)
History and Physical Interval Note:  10/09/2020 12:22 PM  Jeffrey Blevins  has presented today for surgery, with the diagnosis of RIGHT HYDROCELE.  The various methods of treatment have been discussed with the patient and family. After consideration of risks, benefits and other options for treatment, the patient has consented to  Procedure(s): HYDROCELECTOMY ADULT (Right) as a surgical intervention.  The patient's history has been reviewed, patient examined, no change in status, stable for surgery.  I have reviewed the patient's chart and labs.  Questions were answered to the patient's satisfaction.  Discussed again spermatocele vs hydrocele - similar approach and closure, recovery. He is otherwise well. He elects to proceed.    Festus Aloe

## 2020-10-09 NOTE — Transfer of Care (Signed)
Immediate Anesthesia Transfer of Care Note  Patient: Jeffrey Blevins  Procedure(s) Performed: SPERMATOCELECTOMY (Right: Scrotum)  Patient Location: PACU  Anesthesia Type:General  Level of Consciousness: awake, alert  and oriented  Airway & Oxygen Therapy: Patient Spontanous Breathing and Patient connected to nasal cannula oxygen  Post-op Assessment: Report given to RN and Post -op Vital signs reviewed and stable  Post vital signs: Reviewed and stable  Last Vitals:  Vitals Value Taken Time  BP 135/90 10/09/20 1411  Temp    Pulse 78 10/09/20 1412  Resp 14 10/09/20 1412  SpO2 100 % 10/09/20 1412  Vitals shown include unvalidated device data.  Last Pain:  Vitals:   10/09/20 1028  TempSrc: Oral  PainSc: 0-No pain      Patients Stated Pain Goal: 4 ([id number])  Complications: No notable events documented.

## 2020-10-09 NOTE — Anesthesia Procedure Notes (Signed)
Procedure Name: LMA Insertion Date/Time: 10/09/2020 12:55 PM Performed by: Bufford Spikes, CRNA Pre-anesthesia Checklist: Patient identified, Emergency Drugs available, Suction available and Patient being monitored Patient Re-evaluated:Patient Re-evaluated prior to induction Oxygen Delivery Method: Circle system utilized Preoxygenation: Pre-oxygenation with 100% oxygen Induction Type: IV induction Ventilation: Mask ventilation without difficulty LMA: LMA inserted LMA Size: 5.0 Number of attempts: 1 Placement Confirmation: positive ETCO2 Tube secured with: Tape Dental Injury: Teeth and Oropharynx as per pre-operative assessment

## 2020-10-09 NOTE — Anesthesia Preprocedure Evaluation (Addendum)
Anesthesia Evaluation  Patient identified by MRN, date of birth, ID band Patient awake    Reviewed: Allergy & Precautions, NPO status , Patient's Chart, lab work & pertinent test results  Airway Mallampati: I  TM Distance: >3 FB Neck ROM: Full    Dental  (+) Teeth Intact, Dental Advisory Given   Pulmonary Current Smoker and Patient abstained from smoking.,    breath sounds clear to auscultation       Cardiovascular hypertension,  Rhythm:Regular Rate:Normal     Neuro/Psych    GI/Hepatic Neg liver ROS, hiatal hernia, GERD  ,  Endo/Other  negative endocrine ROS  Renal/GU negative Renal ROS     Musculoskeletal negative musculoskeletal ROS (+)   Abdominal Normal abdominal exam  (+)   Peds  Hematology negative hematology ROS (+)   Anesthesia Other Findings   Reproductive/Obstetrics                            Anesthesia Physical Anesthesia Plan  ASA: 2  Anesthesia Plan: General   Post-op Pain Management:    Induction: Intravenous  PONV Risk Score and Plan: 2 and Ondansetron, Dexamethasone and Midazolam  Airway Management Planned: LMA  Additional Equipment: None  Intra-op Plan:   Post-operative Plan: Extubation in OR  Informed Consent: I have reviewed the patients History and Physical, chart, labs and discussed the procedure including the risks, benefits and alternatives for the proposed anesthesia with the patient or authorized representative who has indicated his/her understanding and acceptance.     Dental advisory given  Plan Discussed with: CRNA  Anesthesia Plan Comments:        Anesthesia Quick Evaluation

## 2020-10-09 NOTE — Discharge Instructions (Addendum)
Activity:  You are encouraged to ambulate frequently (about every hour during waking hours) to help prevent blood clots from forming in your legs or lungs.  However, you should not engage in any heavy lifting (> 10-15 lbs), strenuous activity, or straining for 4 weeks. Keep the scrotal support on for 48 hours, and wear tight fitting underwear thereafter for 1 week.   Diet: Regular diet  Prescriptions:  You will be provided a prescription for pain medication to take as needed.  If your pain is not severe enough to require the prescription pain medication, you may take extra strength Tylenol instead which will have less side effects. Ice works very well also.  You should also take a prescribed stool softener to avoid straining with bowel movements as the prescription pain medication may constipate you.  Incisions: The skin glue will fall off on its own in 1-2 weeks. The sutures underneath are absorbable. You may start showering (but not soaking or bathing in water) the 2nd day after surgery and the incisions simply need to be patted dry after the shower.  No additional care is needed.  What to call us about: You should call the office [phone] if you develop fever > 101 or develop persistent vomiting. Call if you have significant bleeding from your incision, or large swelling of the scrotum.     Post Anesthesia Home Care Instructions  Activity: Get plenty of rest for the remainder of the day. A responsible individual must stay with you for 24 hours following the procedure.  For the next 24 hours, DO NOT: -Drive a car -Paediatric nurse -Drink alcoholic beverages -Take any medication unless instructed by your physician -Make any legal decisions or sign important papers.  Meals: Start with liquid foods such as gelatin or soup. Progress to regular foods as tolerated. Avoid greasy, spicy, heavy foods. If nausea and/or vomiting occur, drink only clear liquids until the nausea and/or vomiting  subsides. Call your physician if vomiting continues.  Special Instructions/Symptoms: Your throat may feel dry or sore from the anesthesia or the breathing tube placed in your throat during surgery. If this causes discomfort, gargle with warm salt water. The discomfort should disappear within 24 hours.

## 2020-10-12 ENCOUNTER — Encounter (HOSPITAL_BASED_OUTPATIENT_CLINIC_OR_DEPARTMENT_OTHER): Payer: Self-pay | Admitting: Urology

## 2020-10-23 NOTE — H&P (Signed)
H&P  Chief Complaint: right hydrocele vs spermatocele   History of Present Illness:   1) right spermatocele poss hydrocele - pt was seen in 2013 when [place] was thought to reveal a 2.7 cm right spermatocele. This has slowly enlarged and now on [place] at [facility] 07/06/2020 reports says a large right "hydrocele". It is botersome heavy and "pulls". It buries the penis some and makes voiding difficult. AUASS = 7.      2) peyronie's - penis curves up. Since 2020. Erection non-tender. He has noticed a plaque on the top. ALso has ED. He has trouble getting and maintaining erection. Never tried anything.      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   Past Medical History:  Diagnosis Date   GERD (gastroesophageal reflux disease)    Hiatal hernia    History of Barrett's esophagus    per pt dx yrs ago, but with last egd none noted 05/ 2021   History of esophageal dilatation    per pt hx several times last one approx. 2009 then 05/ 2021  for stricture   Hypertension    followed by pcp   Raynauds phenomenon    Right hydrocele    Past Surgical History:  Procedure Laterality Date   COLONOSCOPY WITH ESOPHAGOGASTRODUODENOSCOPY (EGD)  07/16/2019   last one   INGUINAL HERNIA REPAIR Bilateral    last one 1990s   SPERMATOCELECTOMY Right 10/09/2020   Procedure: SPERMATOCELECTOMY;  Surgeon: Festus Aloe, MD;  Location: [facility];  Service: Urology;  Laterality: Right;    Home Medications:  No medications prior to admission.   Allergies:  Allergies  Allergen Reactions   Codeine Nausea And Vomiting and Rash   Tylenol [Acetaminophen] Rash    History reviewed. No pertinent family history. Social History:  reports that he has been smoking cigarettes. He has never used smokeless tobacco. He reports current alcohol use. He reports that he does not use drugs.  ROS: A complete review of systems was performed.  All systems are negative except for pertinent findings as noted. Review of Systems   All other systems reviewed and are negative.   Physical Exam:  Vital signs in last 24 hours:  GU PHYSICAL EXAMINATION:    Anus and Perineum: No hemorrhoids. No anal stenosis. No rectal fissure, no anal fissure. No edema, no dimple, no perineal tenderness, no anal tenderness.  Scrotum: No lesions. No edema. No cysts. No warts.  Epididymides: Right: no spermatocele, no masses, no cysts, no tenderness, no induration, no enlargement. Left: no spermatocele, no masses, no cysts, no tenderness, no induration, no enlargement.  Testes: 5+ cm hydrocele right testis. No tenderness, no swelling, no enlargement left testis. No tenderness, no swelling, no enlargement right testis. Normal location left testis. Normal location right testis. No mass, no cyst, no varicocele, no hydrocele left testis. No mass, no cyst, no varicocele right testis.   Urethral Meatus: Normal size. No lesion, no wart, no discharge, no polyp. Normal location.  Penis: Circumcised, no warts, no cracks. No dorsal Peyronie's plaques, no left corporal Peyronie's plaques, no right corporal Peyronie's plaques, no scarring, no warts. No balanitis, no meatal stenosis.  Prostate: Prostate about 30 grams. Left lobe normal consistency, right lobe normal consistency. Symmetrical lobes. No prostate nodule. Left lobe no tenderness, right lobe no tenderness.   Seminal Vesicles: Nonpalpable.  Sphincter Tone: Normal sphincter. No rectal tenderness. No rectal mass.    Laboratory Data:  No results found for this or any previous visit (  from the past 24 hour(s)). No results found for this or any previous visit (from the past 240 hour(s)). Creatinine: No results for input(s): CREATININE in the last 168 hours.  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 Discussed nature r/b/a to surgical repair and he elects to proceed.  Discussed  again spermatocele vs hydrocele - similar approach and closure, recovery. He is otherwise well. He elects to proceed.  2   ED due to arterial insufficiency - N52.01 Chronic, Stable - disc nature r/b/a to pde5i and he will start sildenafil.   3   Peyronies Disease - N48.6 Chronic, Stable - disc nature r/b/a to xiaflex and he'll consider    Festus Aloe 10/23/2020, 5:15 PM

## 2021-08-13 ENCOUNTER — Other Ambulatory Visit: Payer: Self-pay

## 2021-08-13 ENCOUNTER — Emergency Department (HOSPITAL_BASED_OUTPATIENT_CLINIC_OR_DEPARTMENT_OTHER): Payer: Managed Care, Other (non HMO)

## 2021-08-13 ENCOUNTER — Encounter (HOSPITAL_BASED_OUTPATIENT_CLINIC_OR_DEPARTMENT_OTHER): Payer: Self-pay | Admitting: Emergency Medicine

## 2021-08-13 ENCOUNTER — Inpatient Hospital Stay (HOSPITAL_BASED_OUTPATIENT_CLINIC_OR_DEPARTMENT_OTHER)
Admission: EM | Admit: 2021-08-13 | Discharge: 2021-08-26 | DRG: 445 | Disposition: A | Discharge status: Home / Self Care | Payer: Managed Care, Other (non HMO) | Attending: Internal Medicine | Admitting: Internal Medicine

## 2021-08-13 DIAGNOSIS — K449 Diaphragmatic hernia without obstruction or gangrene: Secondary | ICD-10-CM | POA: Diagnosis present

## 2021-08-13 DIAGNOSIS — B952 Enterococcus as the cause of diseases classified elsewhere: Secondary | ICD-10-CM | POA: Diagnosis present

## 2021-08-13 DIAGNOSIS — R17 Unspecified jaundice: Principal | ICD-10-CM

## 2021-08-13 DIAGNOSIS — F17210 Nicotine dependence, cigarettes, uncomplicated: Secondary | ICD-10-CM | POA: Diagnosis present

## 2021-08-13 DIAGNOSIS — Z720 Tobacco use: Secondary | ICD-10-CM | POA: Diagnosis present

## 2021-08-13 DIAGNOSIS — Z803 Family history of malignant neoplasm of breast: Secondary | ICD-10-CM

## 2021-08-13 DIAGNOSIS — E860 Dehydration: Secondary | ICD-10-CM | POA: Diagnosis present

## 2021-08-13 DIAGNOSIS — K831 Obstruction of bile duct: Principal | ICD-10-CM | POA: Diagnosis present

## 2021-08-13 DIAGNOSIS — Z886 Allergy status to analgesic agent status: Secondary | ICD-10-CM

## 2021-08-13 DIAGNOSIS — E861 Hypovolemia: Secondary | ICD-10-CM | POA: Diagnosis not present

## 2021-08-13 DIAGNOSIS — Z8249 Family history of ischemic heart disease and other diseases of the circulatory system: Secondary | ICD-10-CM

## 2021-08-13 DIAGNOSIS — Z885 Allergy status to narcotic agent status: Secondary | ICD-10-CM

## 2021-08-13 DIAGNOSIS — I7300 Raynaud's syndrome without gangrene: Secondary | ICD-10-CM | POA: Diagnosis present

## 2021-08-13 DIAGNOSIS — F419 Anxiety disorder, unspecified: Secondary | ICD-10-CM | POA: Diagnosis not present

## 2021-08-13 DIAGNOSIS — Z823 Family history of stroke: Secondary | ICD-10-CM

## 2021-08-13 DIAGNOSIS — R1901 Right upper quadrant abdominal swelling, mass and lump: Secondary | ICD-10-CM

## 2021-08-13 DIAGNOSIS — E871 Hypo-osmolality and hyponatremia: Secondary | ICD-10-CM | POA: Diagnosis present

## 2021-08-13 DIAGNOSIS — K760 Fatty (change of) liver, not elsewhere classified: Secondary | ICD-10-CM | POA: Diagnosis present

## 2021-08-13 DIAGNOSIS — C221 Intrahepatic bile duct carcinoma: Secondary | ICD-10-CM | POA: Diagnosis present

## 2021-08-13 DIAGNOSIS — N401 Enlarged prostate with lower urinary tract symptoms: Secondary | ICD-10-CM | POA: Diagnosis present

## 2021-08-13 DIAGNOSIS — E876 Hypokalemia: Secondary | ICD-10-CM | POA: Diagnosis not present

## 2021-08-13 DIAGNOSIS — K5900 Constipation, unspecified: Secondary | ICD-10-CM | POA: Diagnosis not present

## 2021-08-13 DIAGNOSIS — N179 Acute kidney failure, unspecified: Secondary | ICD-10-CM | POA: Diagnosis present

## 2021-08-13 DIAGNOSIS — K219 Gastro-esophageal reflux disease without esophagitis: Secondary | ICD-10-CM | POA: Diagnosis present

## 2021-08-13 DIAGNOSIS — K5730 Diverticulosis of large intestine without perforation or abscess without bleeding: Secondary | ICD-10-CM | POA: Diagnosis present

## 2021-08-13 DIAGNOSIS — N400 Enlarged prostate without lower urinary tract symptoms: Secondary | ICD-10-CM | POA: Diagnosis present

## 2021-08-13 DIAGNOSIS — I10 Essential (primary) hypertension: Secondary | ICD-10-CM | POA: Diagnosis present

## 2021-08-13 DIAGNOSIS — R351 Nocturia: Secondary | ICD-10-CM | POA: Diagnosis present

## 2021-08-13 LAB — PROTIME-INR
INR: 0.9 (ref 0.8–1.2)
Prothrombin Time: 12.2 seconds (ref 11.4–15.2)

## 2021-08-13 LAB — COMPREHENSIVE METABOLIC PANEL
ALT: 268 U/L — ABNORMAL HIGH (ref 0–44)
AST: 115 U/L — ABNORMAL HIGH (ref 15–41)
Albumin: 4.7 g/dL (ref 3.5–5.0)
Alkaline Phosphatase: 283 U/L — ABNORMAL HIGH (ref 38–126)
Anion gap: 13 (ref 5–15)
BUN: 17 mg/dL (ref 6–20)
CO2: 20 mmol/L — ABNORMAL LOW (ref 22–32)
Calcium: 10.2 mg/dL (ref 8.9–10.3)
Chloride: 98 mmol/L (ref 98–111)
Creatinine, Ser: 1.29 mg/dL — ABNORMAL HIGH (ref 0.61–1.24)
GFR, Estimated: >60 mL/min (ref >60)
Glucose, Bld: 102 mg/dL — ABNORMAL HIGH (ref 70–99)
Potassium: 3.8 mmol/L (ref 3.5–5.1)
Sodium: 131 mmol/L — ABNORMAL LOW (ref 135–145)
Total Bilirubin: 9.8 mg/dL — ABNORMAL HIGH (ref 0.3–1.2)
Total Protein: 7.8 g/dL (ref 6.5–8.1)

## 2021-08-13 LAB — URINALYSIS, ROUTINE W REFLEX MICROSCOPIC
Glucose, UA: NEGATIVE mg/dL
Hgb urine dipstick: NEGATIVE
Ketones, ur: NEGATIVE mg/dL
Leukocytes,Ua: NEGATIVE
Nitrite: NEGATIVE
Protein, ur: NEGATIVE mg/dL
Specific Gravity, Urine: 1.011 (ref 1.005–1.030)
pH: 6 (ref 5.0–8.0)

## 2021-08-13 LAB — HEPATIC FUNCTION PANEL
ALT: 246 U/L — ABNORMAL HIGH (ref 0–44)
AST: 104 U/L — ABNORMAL HIGH (ref 15–41)
Albumin: 4.4 g/dL (ref 3.5–5.0)
Alkaline Phosphatase: 263 U/L — ABNORMAL HIGH (ref 38–126)
Bilirubin, Direct: 5.7 mg/dL — ABNORMAL HIGH (ref 0.0–0.2)
Indirect Bilirubin: 3.9 mg/dL — ABNORMAL HIGH (ref 0.3–0.9)
Total Bilirubin: 9.6 mg/dL — ABNORMAL HIGH (ref 0.3–1.2)
Total Protein: 7.2 g/dL (ref 6.5–8.1)

## 2021-08-13 LAB — CBC
HCT: 38.4 % — ABNORMAL LOW (ref 39.0–52.0)
Hemoglobin: 13.1 g/dL (ref 13.0–17.0)
MCH: 30.8 pg (ref 26.0–34.0)
MCHC: 34.1 g/dL (ref 30.0–36.0)
MCV: 90.4 fL (ref 80.0–100.0)
Platelets: 220 10*3/uL (ref 150–400)
RBC: 4.25 MIL/uL (ref 4.22–5.81)
RDW: 13.6 % (ref 11.5–15.5)
WBC: 5.1 10*3/uL (ref 4.0–10.5)
nRBC: 0 % (ref 0.0–0.2)

## 2021-08-13 LAB — LIPASE, BLOOD: Lipase: 44 U/L (ref 11–51)

## 2021-08-13 MED ORDER — IOHEXOL 300 MG/ML  SOLN
100.0000 mL | Freq: Once | INTRAMUSCULAR | Status: AC | PRN
  Administered 2021-08-13: 100 mL via INTRAVENOUS

## 2021-08-13 NOTE — ED Provider Notes (Signed)
[facility] EMERGENCY DEPT Provider Note   CSN: [id number] Arrival date & time: 08/13/21  1549     History  Chief Complaint  Patient presents with   Jaundice    Jeffrey Blevins is a 56 y.o. male.  Presents emerged department due to concern for jaundice.  Patient reports that over the past month he has noted some intermittent abdominal bloating/discomfort sensation but he denies any sort of abdominal pain right now.  Has not had a significant amount of nausea, no vomiting.  No chills or fevers.  He denies any sort of pain or discomfort right now.  Over the past couple days he has noticed yellowing of his eyes and skin.  Also has noticed change in color of stool and urine.  Denies any major medical problems.  Denies prior cancer history, no recent weight loss.  Does smoke tobacco occasionally, endorses drinking occasionally, denies drinking heavily. Takes tylenol and motrin occasionally but denies any excessive quantities in a given day.  HPI     Home Medications Prior to Admission medications   Medication Sig Start Date End Date Taking? Authorizing Provider  amLODipine (NORVASC) 10 MG tablet Take 10 mg by mouth daily.    [provider]  esomeprazole (NEXIUM) 20 MG capsule Take 20 mg by mouth daily.    [provider]  ibuprofen (ADVIL) 200 MG tablet Take 200 mg by mouth every 6 (six) hours as needed.    [provider]  irbesartan (AVAPRO) 150 MG tablet Take 150 mg by mouth at bedtime.    [provider]      Allergies    Codeine and Tylenol [acetaminophen]    Review of Systems   Review of Systems  Constitutional:  Negative for chills and fever.  HENT:  Negative for ear pain and sore throat.   Eyes:  Negative for pain and visual disturbance.  Respiratory:  Negative for cough and shortness of breath.   Cardiovascular:  Negative for chest pain and palpitations.  Gastrointestinal:  Positive for abdominal distention and abdominal  pain. Negative for vomiting.  Genitourinary:  Negative for dysuria and hematuria.  Musculoskeletal:  Negative for arthralgias and back pain.  Skin:  Positive for color change. Negative for rash.  Neurological:  Negative for seizures and syncope.  All other systems reviewed and are negative.   Physical Exam Updated Vital Signs BP (!) 163/98   Pulse 97   Temp (!) 97 F (36.1 C) (Oral)   Resp 11   Ht '6\' 2"'$  (1.88 m)   Wt 95.3 kg   SpO2 100%   BMI 26.96 kg/m  Physical Exam Vitals and nursing note reviewed.  Constitutional:      General: He is not in acute distress.    Appearance: He is well-developed.  HENT:     Head: Normocephalic and atraumatic.  Eyes:     General: Scleral icterus present.     Conjunctiva/sclera: Conjunctivae normal.  Cardiovascular:     Rate and Rhythm: Normal rate and regular rhythm.     Heart sounds: No murmur heard. Pulmonary:     Effort: Pulmonary effort is normal. No respiratory distress.     Breath sounds: Normal breath sounds.  Abdominal:     Palpations: Abdomen is soft.     Tenderness: There is no abdominal tenderness.     Comments: No tenderness to palpation throughout abdomen  Musculoskeletal:        General: No swelling.     Cervical back: Neck  supple.  Skin:    General: Skin is warm and dry.     Capillary Refill: Capillary refill takes less than 2 seconds.  Neurological:     General: No focal deficit present.     Mental Status: He is alert.  Psychiatric:        Mood and Affect: Mood normal.     ED Results / Procedures / Treatments   Labs (all labs ordered are listed, but only abnormal results are displayed) Labs Reviewed  COMPREHENSIVE METABOLIC PANEL - Abnormal; Notable for the following components:      Result Value   Sodium 131 (*)    CO2 20 (*)    Glucose, Bld 102 (*)    Creatinine, Ser 1.29 (*)    AST 115 (*)    ALT 268 (*)    Alkaline Phosphatase 283 (*)    Total Bilirubin 9.8 (*)    All other components within  normal limits  CBC - Abnormal; Notable for the following components:   HCT 38.4 (*)    All other components within normal limits  URINALYSIS, ROUTINE W REFLEX MICROSCOPIC - Abnormal; Notable for the following components:   Bilirubin Urine SMALL (*)    All other components within normal limits  HEPATIC FUNCTION PANEL - Abnormal; Notable for the following components:   AST 104 (*)    ALT 246 (*)    Alkaline Phosphatase 263 (*)    Total Bilirubin 9.6 (*)    Bilirubin, Direct 5.7 (*)    Indirect Bilirubin 3.9 (*)    All other components within normal limits  LIPASE, BLOOD  PROTIME-INR    EKG None  Radiology CT ABDOMEN PELVIS W CONTRAST  Result Date: 08/13/2021 CLINICAL DATA:  Abdominal pain and jaundice. EXAM: CT ABDOMEN AND PELVIS WITH CONTRAST TECHNIQUE: Multidetector CT imaging of the abdomen and pelvis was performed using the standard protocol following bolus administration of intravenous contrast. RADIATION DOSE REDUCTION: This exam was performed according to the departmental dose-optimization program which includes automated exposure control, adjustment of the mA and/or kV according to patient size and/or use of iterative reconstruction technique. CONTRAST:  150m OMNIPAQUE IOHEXOL 300 MG/ML  SOLN COMPARISON:  None Available. FINDINGS: Lower chest: No acute abnormality. The cardiac size is normal. Small hiatal hernia. Hepatobiliary: Liver is 18 cm length slightly steatotic. In hepatic segment 5 near the liver hilum there is a heterogeneous masslike area of hypoenhancement measuring 2.9 x 2.7 cm on series 2 axial image 19 and 2.7 cm height on coronal reconstruction image 67. There is encasement and apparent complete effacement of the anterior division of the right portal vein associated with this, intrahepatic biliary dilatation above this in segment 8, and intrahepatic biliary dilatation in both segments of the left lobe. The hepatic ductal confluence is just below this and is not well  seen but is suspected to be obstructed for the most part. There is thickening of the wall in the common hepatic duct and probably also in the cystic duct with gallbladder mostly contracted but potentially thickened and with pericholecystic fluid also seen. No calcified stone is evident. The common bile duct is normal caliber below this level and it is unremarkable and not thickened. Primary bile duct neoplasm and primary hepatic neoplasm with secondary biliary obstruction are the most likely possibilities. An inflammatory/infectious process with phlegmon causing a masslike abnormality is possible but this should be considered a neoplasm until proven otherwise. No other focal liver abnormality is seen. The main portal vein and  other portal venous branches opacify well with prominent main portal vein measuring 15.4 mm. Pancreas: No focal abnormality. No ductal dilatation or inflammatory change. Spleen: Mildly prominent, 13.1 cm AP, no mass enhancement. Adrenals/Urinary Tract: There is no adrenal mass. 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 There is no renal mass. No urinary stone or obstruction is evident. There is mild general thickening of the bladder without inflammatory change. Stomach/Bowel: Small hiatal hernia. No bowel dilatation or wall thickening including the appendix. Moderate fecal stasis particularly in the ascending colon is seen, scattered diverticula left-sided colon without diverticulitis. Vascular/Lymphatic: There is mild aortic atherosclerosis without AAA. As above the anterior division of the right portal vein does not opacify and suspected encased and effaced. There is a normal IVC and deep pelvic veins, normal SMV and splenic vein enhancement. There are scattered periportal and portacaval retroperitoneal lymph nodes up to 7 mm in short axis but no area lymph nodes which exceed pathologic size criteria. There is no gastrohepatic ligament or other adenopathy , no  mesenteric or pelvic mass is seen. Reproductive: Enlarged prostate, 5.2 cm transverse with bladder base impression. Multiple phleboliths both pelvic sidewalls. Other: Small umbilical and inguinal fat hernias. No incarcerated hernia. No free air, hemorrhage or ascites. Musculoskeletal: Advanced degenerative disc disease and grade 1 retrolisthesis and spondylosis L5-S1 with acquired spinal stenosis, acquired moderate foraminal stenosis. Schmorl's nodes lower thoracic spine. No destructive bone lesion is seen. IMPRESSION: 1. 2.9 x 2.7 x 2.7 cm hypoenhancing mass in segment 5 near the liver hilum just above the hepatic ductal confluence, suspected either primary bile duct malignancy or primary hepatic malignancy with secondary biliary obstruction. 2. The common hepatic duct is thickened and there is intrahepatic biliary dilatation throughout the left lobe and above the mass in segment 8. 3. Possible an inflammatory/infectious process could produce these findings but this should be considered neoplasm until proven otherwise. 4. There appears to be additional thickening of the cystic duct and there is contraction and possible thickening of the gallbladder with pericholecystic fluid. Coexisting cholecystitis is not excluded. No calcified stone or common bile duct dilatation is seen. 5. No appreciable opacification in the anterior branch of the right portal vein which is suspected encased and effaced by the process. There is mild prominence of the main portal vein with remainder of the portal vein branches opacifying well. 6. Subcentimeter periportal and portacaval space lymph nodes up to 7 mm in short axis but no lymph nodes exceeding the pathologic size criteria. 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 8. Slight splenomegaly, slight hepatic steatosis. 9. Constipation and diverticulosis. 10. Enlarged prostate impressing on the posterior bladder with mild bladder thickening most likely due  to hypertrophy. 11. Umbilical and inguinal fat hernias. Additional findings described above. Electronically Signed   By: Telford Nab M.D.   On: 08/13/2021 20:41    Procedures Procedures    Medications Ordered in ED Medications  iohexol (OMNIPAQUE) 300 MG/ML solution 100 mL (100 mLs Intravenous Contrast Given 08/13/21 1949)    ED Course/ Medical Decision Making/ A&P                           Medical Decision Making Amount and/or Complexity of Data Reviewed Labs: ordered. Radiology: ordered.  Risk Prescription drug management. Decision regarding hospitalization.   56 year old gentleman presenting to ER due to concern for skin color change, stool color change and yellowing of eyes.  Patient  does appear jaundiced on physical exam.  His abdomen is soft and nontender.  His blood work is concerning for significant elevation in his total bilirubin level.  I independently reviewed and interpreted CT scan, agree with radiology report.  Mass near liver hilum concerning for bile duct malignancy or hepatic malignancy.  Intrahepatic duct dilatation.  Consulted gastroenterology.  Discussed with Dr. Alessandra Bevels .  He requests MRI abdomen MRCP be obtained, his team can see patient if patient admitted at [facility] but states pt can be admitted to either [facility] or [facility]. Will update patient and admit to medicine for further management.  Given patient does not have fever, right upper quadrant pain, no leukocytosis, I do not suspect cholecystitis at this time and will hold off on antibiotics.  Much stronger suspicion that patient has new malignancy as cause for the presentation today.         Final Clinical Impression(s) / ED Diagnoses Final diagnoses:  Serum total bilirubin elevated  Right upper quadrant abdominal mass  Jaundice, obstructive, intrahepatic    Rx / DC Orders ED Discharge Orders     None         Lucrezia Starch, MD 08/13/21 2129

## 2021-08-13 NOTE — ED Notes (Signed)
Patient transported to CT 

## 2021-08-13 NOTE — ED Triage Notes (Signed)
Patient reports having dark urine. Reports he has been drinking more water to hydrate and still having dark urine. Yellowing of eye whites and skin started in the last 2 days. New itching of skin Normally drinks a "few beer every day after work" but has had a beer in last week. Denies pain now but reports some abdominal pain earlier in week. Reports stool is lighter, more gray

## 2021-08-14 ENCOUNTER — Inpatient Hospital Stay (HOSPITAL_COMMUNITY): Payer: Managed Care, Other (non HMO)

## 2021-08-14 ENCOUNTER — Encounter (HOSPITAL_COMMUNITY): Payer: Self-pay | Admitting: Internal Medicine

## 2021-08-14 DIAGNOSIS — K5730 Diverticulosis of large intestine without perforation or abscess without bleeding: Secondary | ICD-10-CM | POA: Diagnosis present

## 2021-08-14 DIAGNOSIS — E861 Hypovolemia: Secondary | ICD-10-CM | POA: Diagnosis not present

## 2021-08-14 DIAGNOSIS — K760 Fatty (change of) liver, not elsewhere classified: Secondary | ICD-10-CM | POA: Diagnosis present

## 2021-08-14 DIAGNOSIS — N179 Acute kidney failure, unspecified: Secondary | ICD-10-CM | POA: Diagnosis present

## 2021-08-14 DIAGNOSIS — E871 Hypo-osmolality and hyponatremia: Secondary | ICD-10-CM | POA: Diagnosis present

## 2021-08-14 DIAGNOSIS — E860 Dehydration: Secondary | ICD-10-CM | POA: Diagnosis present

## 2021-08-14 DIAGNOSIS — Q394 Esophageal web: Secondary | ICD-10-CM | POA: Insufficient documentation

## 2021-08-14 DIAGNOSIS — I10 Essential (primary) hypertension: Secondary | ICD-10-CM | POA: Diagnosis present

## 2021-08-14 DIAGNOSIS — Z803 Family history of malignant neoplasm of breast: Secondary | ICD-10-CM | POA: Diagnosis not present

## 2021-08-14 DIAGNOSIS — C220 Liver cell carcinoma: Secondary | ICD-10-CM | POA: Diagnosis not present

## 2021-08-14 DIAGNOSIS — R351 Nocturia: Secondary | ICD-10-CM | POA: Diagnosis present

## 2021-08-14 DIAGNOSIS — K769 Liver disease, unspecified: Secondary | ICD-10-CM | POA: Diagnosis not present

## 2021-08-14 DIAGNOSIS — K219 Gastro-esophageal reflux disease without esophagitis: Secondary | ICD-10-CM | POA: Diagnosis present

## 2021-08-14 DIAGNOSIS — K449 Diaphragmatic hernia without obstruction or gangrene: Secondary | ICD-10-CM | POA: Diagnosis present

## 2021-08-14 DIAGNOSIS — K831 Obstruction of bile duct: Secondary | ICD-10-CM

## 2021-08-14 DIAGNOSIS — N400 Enlarged prostate without lower urinary tract symptoms: Secondary | ICD-10-CM | POA: Diagnosis present

## 2021-08-14 DIAGNOSIS — Z886 Allergy status to analgesic agent status: Secondary | ICD-10-CM | POA: Diagnosis not present

## 2021-08-14 DIAGNOSIS — I7300 Raynaud's syndrome without gangrene: Secondary | ICD-10-CM | POA: Diagnosis present

## 2021-08-14 DIAGNOSIS — F17210 Nicotine dependence, cigarettes, uncomplicated: Secondary | ICD-10-CM | POA: Diagnosis present

## 2021-08-14 DIAGNOSIS — E876 Hypokalemia: Secondary | ICD-10-CM | POA: Diagnosis not present

## 2021-08-14 DIAGNOSIS — R509 Fever, unspecified: Secondary | ICD-10-CM | POA: Diagnosis not present

## 2021-08-14 DIAGNOSIS — Z885 Allergy status to narcotic agent status: Secondary | ICD-10-CM | POA: Diagnosis not present

## 2021-08-14 DIAGNOSIS — K5900 Constipation, unspecified: Secondary | ICD-10-CM | POA: Diagnosis not present

## 2021-08-14 DIAGNOSIS — R7989 Other specified abnormal findings of blood chemistry: Secondary | ICD-10-CM | POA: Diagnosis not present

## 2021-08-14 DIAGNOSIS — Z8249 Family history of ischemic heart disease and other diseases of the circulatory system: Secondary | ICD-10-CM | POA: Diagnosis not present

## 2021-08-14 DIAGNOSIS — B952 Enterococcus as the cause of diseases classified elsewhere: Secondary | ICD-10-CM | POA: Diagnosis present

## 2021-08-14 DIAGNOSIS — N401 Enlarged prostate with lower urinary tract symptoms: Secondary | ICD-10-CM | POA: Diagnosis present

## 2021-08-14 DIAGNOSIS — Z720 Tobacco use: Secondary | ICD-10-CM | POA: Diagnosis present

## 2021-08-14 DIAGNOSIS — R17 Unspecified jaundice: Secondary | ICD-10-CM | POA: Diagnosis present

## 2021-08-14 DIAGNOSIS — C221 Intrahepatic bile duct carcinoma: Secondary | ICD-10-CM | POA: Diagnosis present

## 2021-08-14 DIAGNOSIS — Z823 Family history of stroke: Secondary | ICD-10-CM | POA: Diagnosis not present

## 2021-08-14 DIAGNOSIS — F419 Anxiety disorder, unspecified: Secondary | ICD-10-CM | POA: Diagnosis not present

## 2021-08-14 DIAGNOSIS — R1310 Dysphagia, unspecified: Secondary | ICD-10-CM | POA: Insufficient documentation

## 2021-08-14 LAB — MAGNESIUM: Magnesium: 2.2 mg/dL (ref 1.7–2.4)

## 2021-08-14 LAB — BASIC METABOLIC PANEL
Anion gap: 9 (ref 5–15)
BUN: 17 mg/dL (ref 6–20)
CO2: 22 mmol/L (ref 22–32)
Calcium: 9.5 mg/dL (ref 8.9–10.3)
Chloride: 106 mmol/L (ref 98–111)
Creatinine, Ser: 0.73 mg/dL (ref 0.61–1.24)
GFR, Estimated: >60 mL/min (ref >60)
Glucose, Bld: 101 mg/dL — ABNORMAL HIGH (ref 70–99)
Potassium: 3.9 mmol/L (ref 3.5–5.1)
Sodium: 137 mmol/L (ref 135–145)

## 2021-08-14 LAB — CBC
HCT: 40 % (ref 39.0–52.0)
Hemoglobin: 13.8 g/dL (ref 13.0–17.0)
MCH: 31.4 pg (ref 26.0–34.0)
MCHC: 34.5 g/dL (ref 30.0–36.0)
MCV: 90.9 fL (ref 80.0–100.0)
Platelets: 264 10*3/uL (ref 150–400)
RBC: 4.4 MIL/uL (ref 4.22–5.81)
RDW: 13.6 % (ref 11.5–15.5)
WBC: 6.1 10*3/uL (ref 4.0–10.5)
nRBC: 0 % (ref 0.0–0.2)

## 2021-08-14 LAB — HEPATIC FUNCTION PANEL
ALT: 257 U/L — ABNORMAL HIGH (ref 0–44)
AST: 117 U/L — ABNORMAL HIGH (ref 15–41)
Albumin: 4.4 g/dL (ref 3.5–5.0)
Alkaline Phosphatase: 319 U/L — ABNORMAL HIGH (ref 38–126)
Bilirubin, Direct: 7.4 mg/dL — ABNORMAL HIGH (ref 0.0–0.2)
Indirect Bilirubin: 4.2 mg/dL — ABNORMAL HIGH (ref 0.3–0.9)
Total Bilirubin: 11.6 mg/dL — ABNORMAL HIGH (ref 0.3–1.2)
Total Protein: 7.6 g/dL (ref 6.5–8.1)

## 2021-08-14 LAB — PHOSPHORUS: Phosphorus: 3.9 mg/dL (ref 2.5–4.6)

## 2021-08-14 MED ORDER — ONDANSETRON HCL 4 MG/2ML IJ SOLN
4.0000 mg | Freq: Four times a day (QID) | INTRAMUSCULAR | Status: DC | PRN
  Administered 2021-08-19: 4 mg via INTRAVENOUS
  Filled 2021-08-14: qty 2

## 2021-08-14 MED ORDER — PANTOPRAZOLE SODIUM 40 MG IV SOLR
40.0000 mg | INTRAVENOUS | Status: DC
Start: 2021-08-14 — End: 2021-08-22
  Administered 2021-08-14 – 2021-08-22 (×9): 40 mg via INTRAVENOUS
  Filled 2021-08-14 (×9): qty 10

## 2021-08-14 MED ORDER — DIPHENHYDRAMINE HCL 25 MG PO CAPS
25.0000 mg | ORAL_CAPSULE | Freq: Four times a day (QID) | ORAL | Status: DC | PRN
  Administered 2021-08-21 – 2021-08-22 (×2): 25 mg via ORAL
  Filled 2021-08-14 (×2): qty 1

## 2021-08-14 MED ORDER — SODIUM CHLORIDE 0.9 % IV SOLN
INTRAVENOUS | Status: DC
  Administered 2021-08-16: 100 mL via INTRAVENOUS

## 2021-08-14 MED ORDER — ONDANSETRON HCL 4 MG PO TABS: 4.0000 mg | ORAL_TABLET | Freq: Four times a day (QID) | ORAL | Status: DC | PRN

## 2021-08-14 MED ORDER — GADOBUTROL 1 MMOL/ML IV SOLN
9.0000 mL | Freq: Once | INTRAVENOUS | Status: AC | PRN
  Administered 2021-08-14: 9 mL via INTRAVENOUS

## 2021-08-14 MED ORDER — DIPHENHYDRAMINE HCL 50 MG/ML IJ SOLN
25.0000 mg | Freq: Four times a day (QID) | INTRAMUSCULAR | Status: DC | PRN
  Administered 2021-08-14 – 2021-08-15 (×2): 25 mg via INTRAVENOUS
  Filled 2021-08-14 (×2): qty 1

## 2021-08-14 MED ORDER — METOPROLOL TARTRATE 5 MG/5ML IV SOLN
5.0000 mg | Freq: Two times a day (BID) | INTRAVENOUS | Status: DC
  Administered 2021-08-14 (×2): 5 mg via INTRAVENOUS
  Filled 2021-08-14 (×2): qty 5

## 2021-08-14 NOTE — H&P (Signed)
History and Physical    Patient: Jeffrey Blevins HGD:[id number] DOB: 19-Oct-1965 DOA: 08/13/2021 DOS: the patient was seen and examined on 08/14/2021 PCP: Percell Belt, DO  Patient coming from: Home  Chief Complaint:  Chief Complaint  Patient presents with   Jaundice   HPI: Jeffrey Blevins is a 56 y.o. male with medical history significant of GERD, hiatal hernia, barrettes esophagus, esophageal dilatation, hypertension, Raynaud's phenomenon, right hydrocele with history of spermatocelectomy who is coming to the emergency department with complaints of jaundice.  The patient stated that Monday he started noticing that his earring was dark.  He initially attributed it to being dehydrated and increase his water intake.  However, 2 to 3 days later he noticed that his stools were very light in color and then the following day his skin/eyes became jaundiced.  He stated that on his last blood work with his PCP last year his LFTs were mildly elevated.  No history of hepatitis.  He drinks 2-3 beers every other day on the week, but may drink a 12 pack during the weekend.  His appetite has been decreased, no vomiting, diarrhea, constipation, melena or hematochezia.He denied fever, chills, rhinorrhea, sore throat, wheezing or hemoptysis.  No chest pain, palpitations, diaphoresis, PND, orthopnea or pitting edema of the lower extremities.   No flank pain, dysuria, frequency or hematuria but over the last few months he has been having 3 episodes of nocturia on most nights.  No polyuria, polydipsia, polyphagia or blurred vision.   ED course: Initial vital signs were temperature Initial vital signs were temperature 97 F pulse 67, respiration 18, BP 164/105 mmHg and O2 sat 100% on room air.  Lab work: Urinalysis shows small bilirubinuria, but otherwise unremarkable.  Lipase was normal.  CBC showed a white count of 5.1, hemoglobin 13.1 g/dL platelets 220.  Sodium 131, potassium 3.8, chloride 98 and CO2 20 mmol/L with a  normal anion gap.  Glucose 102, BUN 17 and creatinine 1.29 mg/dL.  Total protein seven-point albumin 4.7 g/dL.  AST 815, ALT 268 and alkaline phosphatase 283 units/L.  Total bilirubin was 9.8 mg/dL.  Imaging: CT abdomen/pelvis showed a 2.9 x 2.7 x 2.7 cm hypoenhancing mass in segment 5 near the liver hilum just above the hepatic ductal confluence, suspect either primary bile duct malignancy or primary hepatic malignancy with secondary biliary obstruction.  The CBD duct is thickened and there is intrahepatic biliary dilatation throughout the left lobe and about the mass.  Inflammatory process could produce this but she only becomes Cetor after neoplasm is ruled out.  There is additional thickening of the cystic duct.  There is contraction and possible thickening of the gallbladder with pericholecystic fluid.  Mild hepatic asteatosis.  Slight splenomegaly with mild prominence of the main portal vein.  Subcentimeter periportal and portocaval space lymph nodes up to 7 mm but no lymph nodes exceeding the pathologic size criteria.  Horseshoe kidney and enlarged prostate with mild bladder thickening.  Constipation and diverticulosis.  Umbilical and inguinal fat hernias.  Please see images and full regular report for further details.   Review of Systems: As mentioned in the history of present illness. All other systems reviewed and are negative. Past Medical History:  Diagnosis Date   GERD (gastroesophageal reflux disease)    Hiatal hernia    History of Barrett's esophagus    per pt dx yrs ago, but with last egd none noted 05/ 2021   History of esophageal dilatation    per  pt hx several times last one approx. 2009 then 05/ 2021  for stricture   Hypertension    followed by pcp   Raynauds phenomenon    Right hydrocele    Past Surgical History:  Procedure Laterality Date   COLONOSCOPY WITH ESOPHAGOGASTRODUODENOSCOPY (EGD)  07/16/2019   last one   INGUINAL HERNIA REPAIR Bilateral    last one 1990s    SPERMATOCELECTOMY Right 10/09/2020   Procedure: SPERMATOCELECTOMY;  Surgeon: Festus Aloe, MD;  Location[facility];  Service: Urology;  Laterality: Right;   Social History:  reports that he has been smoking cigarettes. He has never used smokeless tobacco. He reports current alcohol use of about 4.0 standard drinks of alcohol per week. He reports that he does not use drugs.  Allergies  Allergen Reactions   Codeine Nausea And Vomiting and Rash   Tylenol [Acetaminophen] Rash    History reviewed. No pertinent family history.  Prior to Admission medications   Medication Sig Start Date End Date Taking? Authorizing Provider  amLODipine (NORVASC) 10 MG tablet Take 10 mg by mouth daily.   Yes [provider]  esomeprazole (NEXIUM) 20 MG capsule Take 20 mg by mouth daily.   Yes [provider]  irbesartan (AVAPRO) 150 MG tablet Take 150 mg by mouth at bedtime.   Yes [provider]  ibuprofen (ADVIL) 200 MG tablet Take 200 mg by mouth every 6 (six) hours as needed.    [provider]    Physical Exam: Vitals:   08/14/21 0430 08/14/21 0500 08/14/21 0530 08/14/21 0635  BP: (!) 142/87 (!) 149/91 (!) 132/91 (!) 158/97  Pulse: 88 81 91 94  Resp: '16 16 16 20  '$ Temp:    98.7 F (37.1 C)  TempSrc:    Oral  SpO2: 96% 97% 99% 98%  Weight:      Height:       Physical Exam Vitals and nursing note reviewed.  Constitutional:      General: He is awake.     Appearance: He is overweight. He is ill-appearing.  HENT:     Head: Normocephalic.     Mouth/Throat:     Mouth: Mucous membranes are moist.  Eyes:     General: Scleral icterus present.     Pupils: Pupils are equal, round, and reactive to light.  Neck:     Vascular: No JVD.  Cardiovascular:     Rate and Rhythm: Normal rate and regular rhythm.     Heart sounds: S1 normal and S2 normal.  Pulmonary:     Effort: Pulmonary effort is normal.     Breath sounds: Normal breath sounds. No  wheezing, rhonchi or rales.  Abdominal:     General: Bowel sounds are normal. There is no distension.     Palpations: Abdomen is soft.     Tenderness: There is no abdominal tenderness.  Musculoskeletal:     Cervical back: Neck supple.     Right lower leg: No edema.     Left lower leg: No edema.  Skin:    General: Skin is warm and dry.     Comments: Small abrasions on the upper back and ankles from scratching.  Neurological:     General: No focal deficit present.     Mental Status: He is alert and oriented to person, place, and time.  Psychiatric:        Mood and Affect: Mood normal.        Behavior: Behavior normal. Behavior  is cooperative.    Data Reviewed:  There are no new results to review at this time.  Assessment and Plan: Principal Problem:   Biliary obstruction Telemetry/inpatient. Keep NPO. Continue IV fluids. Antiemetics as needed. MRCP to be done later today. Awaiting GI consult/recommendations.  Active Problems:   Hepatic steatosis Likely due to frequent beer consumption. Alcohol cessation.    GERD (gastroesophageal reflux disease) Parenteral pantoprazole while NPO.    Tobacco use Smokes very likely. Tobacco cessation.    Hypertension Hold oral antihypertensives while NPO. Metoprolol 5 mg IVP every 12 hours. Monitor BP and heart rate.    Hyponatremia In the setting of decreased oral intake. Continue normal saline infusion. Follow-up sodium level.    Prostate enlargement Causing nocturia. Begin tamsulosin once not n.p.o. Follow-up with urology as an outpatient.        Advance Care Planning:   Code Status: Full Code   Consults: [facility] (Dr. Alessandra Bevels).  Family Communication: His wife was at bedside.  Severity of Illness: The appropriate patient status for this patient is INPATIENT. Inpatient status is judged to be reasonable and necessary in order to provide the required intensity of service to ensure the patient's safety. The  patient's presenting symptoms, physical exam findings, and initial radiographic and laboratory data in the context of their chronic comorbidities is felt to place them at high risk for further clinical deterioration. Furthermore, it is not anticipated that the patient will be medically stable for discharge from the hospital within 2 midnights of admission.   * I certify that at the point of admission it is my clinical judgment that the patient will require inpatient hospital care spanning beyond 2 midnights from the point of admission due to high intensity of service, high risk for further deterioration and high frequency of surveillance required.*  Author: Reubin Milan, MD 08/14/2021 7:50 AM  For on call review [email or web address].   This document was prepared using Dragon voice recognition software and may contain some unintended transcription errors.

## 2021-08-14 NOTE — Progress Notes (Signed)
   Case reviewed w/ primary GI MD Dr. Benson Norway Plan IR eval and tx Consult ordered. Dr. Benson Norway to assume GI care 6/19  Gatha Mayer, MD, [facility] Gastroenterology See Shea Evans on call - gastroenterology for best contact person 08/14/2021 9:45 PM

## 2021-08-14 NOTE — Progress Notes (Signed)
Transferring facility: [facility] Requesting provider: Dr. Roslynn Amble (EDP at [facility]) Reason for transfer: admission for further evaluation and management of acute biliary obstruction in the setting of new diagnosis of intra-abdominal mass in setting of presenting jaundice.    56 year old male who presented to [place] ED on 08/13/2021 complaining of new finding of yellow skin appearance over the last few days.  He notes general malaise, fatigue over the course of the last month, denies any significant abdominal discomfort over that time.  Vital signs in the ED were notable for the following: Afebrile.  Normotensive, mildly hypertensive.  Labs were notable for CMP which demonstrated total bilirubin of 9.8.  No leukocytosis.  Imaging notable for CT abdomen pelvis which showed new intra-abdominal mass, with findings also concerning for associated biliary obstruction.   [facility] EDP discussed the patient's case/imaging with the on-call [facility] gastroenterologist, Dr. Christianne Borrow, who recommended admission to the hospitalist service at [facility] on call at [facility]) for further evaluation and management of new biliary obstruction.  Dr. Christianne Borrow requested that MRCP be performed, and conveyed that [facility] gastroenterology would consult and evaluate the patient.   Subsequently, I accepted this patient for transfer for inpatient admission to a med telemetry was along bed at [facility].       Check [email or web address] for on-call coverage.   Nursing staff, Please call [place] number on Amion as soon as patient's arrival, so appropriate admitting provider can evaluate the pt.     Babs Bertin, DO Hospitalist

## 2021-08-14 NOTE — Consult Note (Signed)
Referring Provider:  EDP Primary Care Physician:  Percell Belt, DO Primary Gastroenterologist: Benson Norway   Reason for Consultation: Jaundice  HPI: Jeffrey Blevins is a 56 y.o. male with past medical history of GERD, Barrett's esophagus based on documentation, history of esophageal stricture presented to [facility] with change in skin coloration and dark urine for last few days.  Also having abdominal bloating and discomfort for last 1 month.  He denies any significant weight loss.  Appetite was also good.  smokes 1 cigarette/day and drinks few beers every day.  Upon initial evaluation, he was found to have significant jaundice with T. bili of 9.8, AST 115, ALT 268 and alkaline phosphatase 283.  Normal lipase.  Normal CBC.  Repeat blood work this morning showed worsening of jaundice with T. bili of 11.6.   CT abdomen pelvis with IV contrast yesterday showed 2.9 cm enhancing mass in the segment 5 in the liver hilum concerning for primary bile duct malignancy versus hepatic malignancy with biliary obstruction.  It showed encasement and apparent complete effacement of the anterior division of the right portal vein.  Also showed intrahepatic and extrahepatic biliary dilation as well as thickening of the wall in the common hepatic duct and cystic duct.  Review of records from care everywhere shows that he had a mild elevated LFTs back in June 03 2020 with alk phos of 152, AST 42 and ALT of 64.  Repeat blood work on June 24, 2020 showed normal AST, ALT T. bili with alk phosphatase of 179.  Mother was diagnosed with breast cancer in her 74s.  No other history of pancreatic or liver cancer in the family.  Past Medical History:  Diagnosis Date   GERD (gastroesophageal reflux disease)    Hiatal hernia    History of Barrett's esophagus    per pt dx yrs ago, but with last egd none noted 05/ 2021   History of esophageal dilatation    per pt hx several times last one approx. 2009 then 05/ 2021   for stricture   Hypertension    followed by pcp   Raynauds phenomenon    Right hydrocele     Past Surgical History:  Procedure Laterality Date   COLONOSCOPY WITH ESOPHAGOGASTRODUODENOSCOPY (EGD)  07/16/2019   last one   INGUINAL HERNIA REPAIR Bilateral    last one 1990s   SPERMATOCELECTOMY Right 10/09/2020   Procedure: SPERMATOCELECTOMY;  Surgeon: Festus Aloe, MD;  Location: [facility];  Service: Urology;  Laterality: Right;    Prior to Admission medications   Medication Sig Start Date End Date Taking? Authorizing Provider  acetaminophen (TYLENOL) 500 MG tablet Take 1,000 mg by mouth 3 (three) times daily as needed for headache (pain).   Yes [provider]  amLODipine (NORVASC) 10 MG tablet Take 10 mg by mouth every morning.   Yes [provider]  esomeprazole (NEXIUM) 20 MG capsule Take 20 mg by mouth every morning.   Yes [provider]  ibuprofen (ADVIL) 200 MG tablet Take 400 mg by mouth 3 (three) times daily as needed for headache (pain).   Yes [provider]  irbesartan (AVAPRO) 150 MG tablet Take 150 mg by mouth at bedtime.   Yes [provider]    Scheduled Meds:  metoprolol tartrate  5 mg Intravenous Q12H   pantoprazole (PROTONIX) IV  40 mg Intravenous Q24H   Continuous Infusions:  sodium chloride     PRN Meds:.diphenhydrAMINE, diphenhydrAMINE, ondansetron **OR** ondansetron (  ZOFRAN) IV  Allergies as of 08/13/2021 - Review Complete 08/13/2021  Allergen Reaction Noted   Codeine Nausea And Vomiting and Rash 10/05/2020   Tylenol [acetaminophen] Rash 10/06/2020    Family History  Problem Relation Age of Onset   Breast cancer Mother    Stroke Father    Breast cancer Sister    Cardiomyopathy Brother        Cardiomegaly    Social History   Socioeconomic History   Marital status: Married    Spouse name: Not on file   Number of children: Not on file   Years of education: Not on file   Highest  education level: Not on file  Occupational History   Not on file  Tobacco Use   Smoking status: Some Days    Years: 5.00    Types: Cigarettes   Smokeless tobacco: Never   Tobacco comments:    10-06-2020  per pt 1pp 7days  Vaping Use   Vaping Use: Never used  Substance and Sexual Activity   Alcohol use: Yes    Alcohol/week: 4.0 standard drinks of alcohol    Types: 4 Cans of beer per week   Drug use: Never   Sexual activity: Not on file  Other Topics Concern   Not on file  Social History Narrative   Not on file   Social Determinants of Health   Financial Resource Strain: Not on file  Food Insecurity: Not on file  Transportation Needs: Not on file  Physical Activity: Not on file  Stress: Not on file  Social Connections: Not on file  Intimate Partner Violence: Not on file    Review of Systems: 12 point review of system is done which is negative except as mentioned in HPI.  Physical Exam: Vital signs: Vitals:   08/14/21 0635 08/14/21 1039  BP: (!) 158/97 (!) 144/97  Pulse: 94 63  Resp: 20 16  Temp: 98.7 F (37.1 C) 99 F (37.2 C)  SpO2: 98% 97%   Last BM Date : 08/14/21 Physical Exam Constitutional:      General: He is not in acute distress.    Appearance: Normal appearance.  HENT:     Head: Normocephalic and atraumatic.     Nose: Nose normal.  Eyes:     General: Scleral icterus present.     Extraocular Movements: Extraocular movements intact.  Cardiovascular:     Rate and Rhythm: Normal rate and regular rhythm.     Heart sounds: No murmur heard. Pulmonary:     Effort: Pulmonary effort is normal. No respiratory distress.  Abdominal:     General: Bowel sounds are normal. There is no distension.     Palpations: Abdomen is soft.     Tenderness: There is no abdominal tenderness. There is no guarding.  Musculoskeletal:        General: No swelling or tenderness. Normal range of motion.     Cervical back: Normal range of motion.  Skin:    General: Skin is  warm.     Coloration: Skin is jaundiced.  Neurological:     General: No focal deficit present.     Mental Status: He is alert and oriented to person, place, and time.  Psychiatric:        Mood and Affect: Mood normal.        Behavior: Behavior normal.        Thought Content: Thought content normal.        Judgment: Judgment normal.  GI:  Lab Results: Recent Labs    08/13/21 1618 08/14/21 0749  WBC 5.1 6.1  HGB 13.1 13.8  HCT 38.4* 40.0  PLT 220 264   BMET Recent Labs    08/13/21 1618 08/14/21 0749  NA 131* 137  K 3.8 3.9  CL 98 106  CO2 20* 22  GLUCOSE 102* 101*  BUN 17 17  CREATININE 1.29* 0.73  CALCIUM 10.2 9.5   LFT Recent Labs    08/14/21 0749  PROT 7.6  ALBUMIN 4.4  AST 117*  ALT 257*  ALKPHOS 319*  BILITOT 11.6*  BILIDIR 7.4*  IBILI 4.2*   PT/INR Recent Labs    08/13/21 1945  LABPROT 12.2  INR 0.9     Studies/Results: MR ABDOMEN MRCP W WO CONTAST  Result Date: 08/14/2021 CLINICAL DATA:  Jaundice EXAM: MRI ABDOMEN WITHOUT AND WITH CONTRAST (INCLUDING MRCP) TECHNIQUE: Multiplanar multisequence MR imaging of the abdomen was performed both before and after the administration of intravenous contrast. Heavily T2-weighted images of the biliary and pancreatic ducts were obtained, and three-dimensional MRCP images were rendered by post processing. CONTRAST:  70m GADAVIST GADOBUTROL 1 MMOL/ML IV SOLN COMPARISON:  CT abdomen and pelvis dated August 13, 2021 FINDINGS: Lower chest: No acute findings. Hepatobiliary: Irregular mass of segment 8 liver measuring 2.9 x 2.8 cm on series 3, image 14 with intermediate T2 signal, low T1 signal and perilesional enhancement that gradually fills in on more delayed imaging. Diffuse mild intrahepatic biliary ductal dilation with more focal intrahepatic ductal dilation seen distal to the mass. Normal caliber common bile duct with wall thickening. No choledocholithiasis. Gallbladder is decompressed. Pancreas: No mass,  inflammatory changes, or other parenchymal abnormality identified. Spleen:  Within normal limits in size and appearance. Adrenals/Urinary Tract: Bilateral adrenal glands are unremarkable. 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 Stomach/Bowel: Visualized portions within the abdomen are unremarkable. Vascular/Lymphatic: No pathologically enlarged lymph nodes identified. No abdominal aortic aneurysm demonstrated. Other:  None. Musculoskeletal: No suspicious bone lesions identified. IMPRESSION: 1. Irregular mass of segment 8 of the liver with perilesional enhancement that gradually fills in on more delayed imaging, concerning for cholangiocarcinoma. 2. Diffuse mild intrahepatic biliary ductal dilation with wall thickening of the common bile duct. Findings are concerning for tumor extension along the common bile duct. 3. Gallbladder is thick-walled and decompressed, concerning for additional site of tumor extension. Electronically Signed   By: LYetta GlassmanM.D.   On: 08/14/2021 12:22   MR 3D Recon At Scanner  Result Date: 08/14/2021 CLINICAL DATA:  Jaundice EXAM: MRI ABDOMEN WITHOUT AND WITH CONTRAST (INCLUDING MRCP) TECHNIQUE: Multiplanar multisequence MR imaging of the abdomen was performed both before and after the administration of intravenous contrast. Heavily T2-weighted images of the biliary and pancreatic ducts were obtained, and three-dimensional MRCP images were rendered by post processing. CONTRAST:  969mGADAVIST GADOBUTROL 1 MMOL/ML IV SOLN COMPARISON:  CT abdomen and pelvis dated August 13, 2021 FINDINGS: Lower chest: No acute findings. Hepatobiliary: Irregular mass of segment 8 liver measuring 2.9 x 2.8 cm on series 3, image 14 with intermediate T2 signal, low T1 signal and perilesional enhancement that gradually fills in on more delayed imaging. Diffuse mild intrahepatic biliary ductal dilation with more focal intrahepatic ductal dilation seen distal to the mass. Normal  caliber common bile duct with wall thickening. No choledocholithiasis. Gallbladder is decompressed. Pancreas: No mass, inflammatory changes, or other parenchymal abnormality identified. Spleen:  Within normal limits in size and appearance. Adrenals/Urinary Tract: Bilateral adrenal  glands are unremarkable. 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 Stomach/Bowel: Visualized portions within the abdomen are unremarkable. Vascular/Lymphatic: No pathologically enlarged lymph nodes identified. No abdominal aortic aneurysm demonstrated. Other:  None. Musculoskeletal: No suspicious bone lesions identified. IMPRESSION: 1. Irregular mass of segment 8 of the liver with perilesional enhancement that gradually fills in on more delayed imaging, concerning for cholangiocarcinoma. 2. Diffuse mild intrahepatic biliary ductal dilation with wall thickening of the common bile duct. Findings are concerning for tumor extension along the common bile duct. 3. Gallbladder is thick-walled and decompressed, concerning for additional site of tumor extension. Electronically Signed   By: Yetta Glassman M.D.   On: 08/14/2021 12:22   CT ABDOMEN PELVIS W CONTRAST  Result Date: 08/13/2021 CLINICAL DATA:  Abdominal pain and jaundice. EXAM: CT ABDOMEN AND PELVIS WITH CONTRAST TECHNIQUE: Multidetector CT imaging of the abdomen and pelvis was performed using the standard protocol following bolus administration of intravenous contrast. RADIATION DOSE REDUCTION: This exam was performed according to the departmental dose-optimization program which includes automated exposure control, adjustment of the mA and/or kV according to patient size and/or use of iterative reconstruction technique. CONTRAST:  12m OMNIPAQUE IOHEXOL 300 MG/ML  SOLN COMPARISON:  None Available. FINDINGS: Lower chest: No acute abnormality. The cardiac size is normal. Small hiatal hernia. Hepatobiliary: Liver is 18 cm length slightly steatotic. In hepatic  segment 5 near the liver hilum there is a heterogeneous masslike area of hypoenhancement measuring 2.9 x 2.7 cm on series 2 axial image 19 and 2.7 cm height on coronal reconstruction image 67. There is encasement and apparent complete effacement of the anterior division of the right portal vein associated with this, intrahepatic biliary dilatation above this in segment 8, and intrahepatic biliary dilatation in both segments of the left lobe. The hepatic ductal confluence is just below this and is not well seen but is suspected to be obstructed for the most part. There is thickening of the wall in the common hepatic duct and probably also in the cystic duct with gallbladder mostly contracted but potentially thickened and with pericholecystic fluid also seen. No calcified stone is evident. The common bile duct is normal caliber below this level and it is unremarkable and not thickened. Primary bile duct neoplasm and primary hepatic neoplasm with secondary biliary obstruction are the most likely possibilities. An inflammatory/infectious process with phlegmon causing a masslike abnormality is possible but this should be considered a neoplasm until proven otherwise. No other focal liver abnormality is seen. The main portal vein and other portal venous branches opacify well with prominent main portal vein measuring 15.4 mm. Pancreas: No focal abnormality. No ductal dilatation or inflammatory change. Spleen: Mildly prominent, 13.1 cm AP, no mass enhancement. Adrenals/Urinary Tract: There is no adrenal mass. 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 There is no renal mass. No urinary stone or obstruction is evident. There is mild general thickening of the bladder without inflammatory change. Stomach/Bowel: Small hiatal hernia. No bowel dilatation or wall thickening including the appendix. Moderate fecal stasis particularly in the ascending colon is seen, scattered diverticula left-sided colon  without diverticulitis. Vascular/Lymphatic: There is mild aortic atherosclerosis without AAA. As above the anterior division of the right portal vein does not opacify and suspected encased and effaced. There is a normal IVC and deep pelvic veins, normal SMV and splenic vein enhancement. There are scattered periportal and portacaval retroperitoneal lymph nodes up to 7 mm in short axis but no area lymph nodes which  exceed pathologic size criteria. There is no gastrohepatic ligament or other adenopathy , no mesenteric or pelvic mass is seen. Reproductive: Enlarged prostate, 5.2 cm transverse with bladder base impression. Multiple phleboliths both pelvic sidewalls. Other: Small umbilical and inguinal fat hernias. No incarcerated hernia. No free air, hemorrhage or ascites. Musculoskeletal: Advanced degenerative disc disease and grade 1 retrolisthesis and spondylosis L5-S1 with acquired spinal stenosis, acquired moderate foraminal stenosis. Schmorl's nodes lower thoracic spine. No destructive bone lesion is seen. IMPRESSION: 1. 2.9 x 2.7 x 2.7 cm hypoenhancing mass in segment 5 near the liver hilum just above the hepatic ductal confluence, suspected either primary bile duct malignancy or primary hepatic malignancy with secondary biliary obstruction. 2. The common hepatic duct is thickened and there is intrahepatic biliary dilatation throughout the left lobe and above the mass in segment 8. 3. Possible an inflammatory/infectious process could produce these findings but this should be considered neoplasm until proven otherwise. 4. There appears to be additional thickening of the cystic duct and there is contraction and possible thickening of the gallbladder with pericholecystic fluid. Coexisting cholecystitis is not excluded. No calcified stone or common bile duct dilatation is seen. 5. No appreciable opacification in the anterior branch of the right portal vein which is suspected encased and effaced by the process. There  is mild prominence of the main portal vein with remainder of the portal vein branches opacifying well. 6. Subcentimeter periportal and portacaval space lymph nodes up to 7 mm in short axis but no lymph nodes exceeding the pathologic size criteria. 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 8. Slight splenomegaly, slight hepatic steatosis. 9. Constipation and diverticulosis. 10. Enlarged prostate impressing on the posterior bladder with mild bladder thickening most likely due to hypertrophy. 11. Umbilical and inguinal fat hernias. Additional findings described above. Electronically Signed   By: Telford Nab M.D.   On: 08/13/2021 20:41    Impression/Plan: -Obstructive jaundice with MRI concerning for 2.9 cm mass in the segment 8 of the liver with diffuse intrahepatic biliary dilation.  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  It also showed wall thickening of the common bile duct concerning for tumor extension along the common bile duct.  Bile duct is normal caliber.  Recommendations --------------------------- -MRI report reviewed personally.  I believe lesion in the segment 8 would be amenable for IR guided liver biopsy. -He may need ERCP with biliary stent placement for obstructive jaundice but looks like jaundice is coming from intrahepatic process and not necessarily from CBD narrowing but proximal CBD stenting and stenting of common hepatic duct might help relieve some obstruction.  Selective cannulation of right hepatic duct and stent placement in the right hepatic duct might help. -Other option is EUS with FNA/FNB and ERCP with stent placement together depending on schedule availability. -Patient established with Dr. Benson Norway. D/W  Dr. Carlean Purl. They will see patient tomorrow and decide on further management options. -Okay to have regular diet today   LOS: 0 days   Otis Brace  MD, FACP 08/14/2021, 12:41 PM  Contact #  [phone]

## 2021-08-15 DIAGNOSIS — K831 Obstruction of bile duct: Secondary | ICD-10-CM | POA: Diagnosis not present

## 2021-08-15 LAB — COMPREHENSIVE METABOLIC PANEL
ALT: 210 U/L — ABNORMAL HIGH (ref 0–44)
AST: 108 U/L — ABNORMAL HIGH (ref 15–41)
Albumin: 3.6 g/dL (ref 3.5–5.0)
Alkaline Phosphatase: 279 U/L — ABNORMAL HIGH (ref 38–126)
Anion gap: 8 (ref 5–15)
BUN: 18 mg/dL (ref 6–20)
CO2: 22 mmol/L (ref 22–32)
Calcium: 8.9 mg/dL (ref 8.9–10.3)
Chloride: 108 mmol/L (ref 98–111)
Creatinine, Ser: 0.91 mg/dL (ref 0.61–1.24)
GFR, Estimated: >60 mL/min (ref >60)
Glucose, Bld: 108 mg/dL — ABNORMAL HIGH (ref 70–99)
Potassium: 4 mmol/L (ref 3.5–5.1)
Sodium: 138 mmol/L (ref 135–145)
Total Bilirubin: 10.5 mg/dL — ABNORMAL HIGH (ref 0.3–1.2)
Total Protein: 6.5 g/dL (ref 6.5–8.1)

## 2021-08-15 LAB — CBC
HCT: 37.7 % — ABNORMAL LOW (ref 39.0–52.0)
Hemoglobin: 12.6 g/dL — ABNORMAL LOW (ref 13.0–17.0)
MCH: 31 pg (ref 26.0–34.0)
MCHC: 33.4 g/dL (ref 30.0–36.0)
MCV: 92.6 fL (ref 80.0–100.0)
Platelets: 233 10*3/uL (ref 150–400)
RBC: 4.07 MIL/uL — ABNORMAL LOW (ref 4.22–5.81)
RDW: 14 % (ref 11.5–15.5)
WBC: 4.7 10*3/uL (ref 4.0–10.5)
nRBC: 0 % (ref 0.0–0.2)

## 2021-08-15 LAB — HIV ANTIBODY (ROUTINE TESTING W REFLEX): HIV Screen 4th Generation wRfx: NONREACTIVE

## 2021-08-15 LAB — CANCER ANTIGEN 19-9: CA 19-9: 76 U/mL — ABNORMAL HIGH (ref 0–35)

## 2021-08-15 MED ORDER — AMLODIPINE BESYLATE 10 MG PO TABS
10.0000 mg | ORAL_TABLET | Freq: Every morning | ORAL | Status: DC
  Administered 2021-08-15 – 2021-08-26 (×11): 10 mg via ORAL
  Filled 2021-08-15 (×11): qty 1

## 2021-08-15 MED ORDER — ALPRAZOLAM 0.25 MG PO TABS
0.2500 mg | ORAL_TABLET | Freq: Two times a day (BID) | ORAL | Status: DC | PRN
  Administered 2021-08-15 – 2021-08-24 (×7): 0.25 mg via ORAL
  Filled 2021-08-15 (×7): qty 1

## 2021-08-15 NOTE — Progress Notes (Addendum)
PROGRESS NOTE    Jeffrey Blevins  XBD:[id number] DOB: 1965/12/10 DOA: 08/13/2021 PCP: Percell Belt, DO   Brief Narrative: 56 year old with past medical history significant for GERD, Barrett's esophagus, history of esophageal stricture status post dilation, hypertension, Raynaud's phenomena, right hydrocephaly with history of a spermatocelectomy presents with Jaundice.  He noticed dark urine since 5 days prior to admission.  He is started to drink more water.  Subsequently he noticed his stool was very light in color and his skin became yellow.  He presented for further evaluation.  He was found to have normal lipase, sodium 131, creatinine 1.2, AST: 115, ALT 268, alkaline phosphatase 283, bilirubin 9.8.  - CT abdomen and pelvis showed 2.9 x 2.7 x 2.7 cm hypoenhancing mass in the segment 5 near the liver hilum just above the hepatic ductal confluence, suspected either primary bile duct malignancy or primary hepatic malignancy with secondary biliary obstruction.  The common hepatic duct is thickened and there is intrahepatic biliary dilation throughout the left lobe and above the mass in segment 8. There appears to be additional thickening of the cystic duct and there is contraction and possible thickening of the gallbladder with pericholecystic fluid. Coexisting cholecystitis is not excluded. No calcified stone or common bile duct dilatation is seen.  Assessment & Plan:   Principal Problem:   Biliary obstruction Active Problems:   Tobacco use   Hypertension   Hyponatremia   Prostate enlargement   Hepatic steatosis   GERD (gastroesophageal reflux disease)  1-Obstructive Jaundice: Transaminases, Hyperbilirubinemia:  Secondary to obstructive liver mass.  -CT abdomen Pelvis: 2.9 x 2.7 x 2.7 cm hypoenhancing mass in the segment 5 near the liver hilum just above the hepatic ductal confluence,  The common hepatic duct is thickened and there is intrahepatic biliary dilation throughout the left lobe  and above the mass in segment 8. -MRCP: Irregular mass of segment 8 of the liver with perilesional enhancement that gradually fills in on more delayed imaging, concerning for cholangiocarcinoma. Diffuse mild intrahepatic biliary ductal dilation with wall thickening of the common bile duct. Findings are concerning for tumor extension along the common bile duct. Gallbladder is thick-walled and decompressed, concerning for additional site of tumor extension. -IR consulted for evaluation and treatment.  -GI following, Dr Benson [place] primary. [facility] following over weekend.  -Will check alfa fetoprotein, Viral Hepatitis  panel,  Ca 19 76 -IR planning BL percutaneous transhepatic cholangiogram  and biliary drain placement and liver Biopsy 08/16/2021.Marland Kitchen   GERD:  -Continue with PPI>   Tobacco Use:  Counseling.   HTN:  DC Metoprolol, bradycardia.  Resume norvasc.  Hold Avapro due to hyponatremia.   Hyponatremia:  Hypovolemia;  On IV fluids.  Resolved.   Prostate Enlargement.  Started Tamsulosin.  Needs to follow up with urology out patient.   Anxiety; start low dose xanax.     Estimated body mass index is 26.96 kg/m as calculated from the following:   Height as of this encounter: '6\' 2"'$  (1.88 m).   Weight as of this encounter: 95.3 kg.   DVT prophylaxis: SCD, he will need Bx.  Code Status: Full code Family Communication: family at bedside.  Disposition Plan:  Status is: Inpatient Remains inpatient appropriate because: obstructive Jaundice.     Consultants:  GI  Procedures:  MRCP  Antimicrobials:  None  Subjective: He feels anxious with all new information. He just spoke with IR plan for percutaneous drainage.  He is asking for something for anxiety.   Objective:  Vitals:   08/14/21 1426 08/14/21 1835 08/14/21 2103 08/15/21 0523  BP: (!) 142/89 (!) 151/87 (!) 147/91 (!) 142/86  Pulse: 71 66 (!) 57 (!) 57  Resp: '18 18 18 18  '$ Temp: 98.3 F (36.8 C)   98.6 F (37 C)   TempSrc: Oral   Oral  SpO2: 98% 97% 100% 98%  Weight:      Height:        Intake/Output Summary (Last 24 hours) at 08/15/2021 0734 Last data filed at 08/14/2021 1430 Gross per 24 hour  Intake 940 ml  Output --  Net 940 ml   Filed Weights   08/13/21 1604  Weight: 95.3 kg    Examination:  General exam: Appears calm and comfortable, Icteric.   Respiratory system: Clear to auscultation. Respiratory effort normal. Cardiovascular system: S1 & S2 heard, RRR.  Gastrointestinal system: Abdomen is nondistended, soft  Central nervous system: Alert and oriented. No focal neurological deficits. Extremities: Symmetric 5 x 5 power.   Data Reviewed: I have personally reviewed following labs and imaging studies  CBC: Recent Labs  Lab 08/13/21 1618 08/14/21 0749 08/15/21 0535  WBC 5.1 6.1 4.7  HGB 13.1 13.8 12.6*  HCT 38.4* 40.0 37.7*  MCV 90.4 90.9 92.6  PLT 220 264 932   Basic Metabolic Panel: Recent Labs  Lab 08/13/21 1618 08/14/21 0749 08/15/21 0535  NA 131* 137 138  K 3.8 3.9 4.0  CL 98 106 108  CO2 20* 22 22  GLUCOSE 102* 101* 108*  BUN '17 17 18  '$ CREATININE 1.29* 0.73 0.91  CALCIUM 10.2 9.5 8.9  MG  --  2.2  --   PHOS  --  3.9  --    GFR: Estimated Creatinine Clearance: 105.4 mL/min (by C-G formula based on SCr of 0.91 mg/dL). Liver Function Tests: Recent Labs  Lab 08/13/21 1618 08/13/21 1945 08/14/21 0749 08/15/21 0535  AST 115* 104* 117* 108*  ALT 268* 246* 257* 210*  ALKPHOS 283* 263* 319* 279*  BILITOT 9.8* 9.6* 11.6* 10.5*  PROT 7.8 7.2 7.6 6.5  ALBUMIN 4.7 4.4 4.4 3.6   Recent Labs  Lab 08/13/21 1618  LIPASE 44   No results for input(s): "AMMONIA" in the last 168 hours. Coagulation Profile: Recent Labs  Lab 08/13/21 1945  INR 0.9   Cardiac Enzymes: No results for input(s): "CKTOTAL", "CKMB", "CKMBINDEX", "TROPONINI" in the last 168 hours. BNP (last 3 results) No results for input(s): "PROBNP" in the last 8760 hours. HbA1C: No  results for input(s): "HGBA1C" in the last 72 hours. CBG: No results for input(s): "GLUCAP" in the last 168 hours. Lipid Profile: No results for input(s): "CHOL", "HDL", "LDLCALC", "TRIG", "CHOLHDL", "LDLDIRECT" in the last 72 hours. Thyroid Function Tests: No results for input(s): "TSH", "T4TOTAL", "FREET4", "T3FREE", "THYROIDAB" in the last 72 hours. Anemia Panel: No results for input(s): "VITAMINB12", "FOLATE", "FERRITIN", "TIBC", "IRON", "RETICCTPCT" in the last 72 hours. Sepsis Labs: No results for input(s): "PROCALCITON", "LATICACIDVEN" in the last 168 hours.  No results found for this or any previous visit (from the past 240 hour(s)).       Radiology Studies: MR ABDOMEN MRCP W WO CONTAST  Result Date: 08/14/2021 CLINICAL DATA:  Jaundice EXAM: MRI ABDOMEN WITHOUT AND WITH CONTRAST (INCLUDING MRCP) TECHNIQUE: Multiplanar multisequence MR imaging of the abdomen was performed both before and after the administration of intravenous contrast. Heavily T2-weighted images of the biliary and pancreatic ducts were obtained, and three-dimensional MRCP images were rendered by post processing. CONTRAST:  74m  GADAVIST GADOBUTROL 1 MMOL/ML IV SOLN COMPARISON:  CT abdomen and pelvis dated August 13, 2021 FINDINGS: Lower chest: No acute findings. Hepatobiliary: Irregular mass of segment 8 liver measuring 2.9 x 2.8 cm on series 3, image 14 with intermediate T2 signal, low T1 signal and perilesional enhancement that gradually fills in on more delayed imaging. Diffuse mild intrahepatic biliary ductal dilation with more focal intrahepatic ductal dilation seen distal to the mass. Normal caliber common bile duct with wall thickening. No choledocholithiasis. Gallbladder is decompressed. Pancreas: No mass, inflammatory changes, or other parenchymal abnormality identified. Spleen:  Within normal limits in size and appearance. Adrenals/Urinary Tract: Bilateral adrenal glands are unremarkable. 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 Stomach/Bowel: Visualized portions within the abdomen are unremarkable. Vascular/Lymphatic: No pathologically enlarged lymph nodes identified. No abdominal aortic aneurysm demonstrated. Other:  None. Musculoskeletal: No suspicious bone lesions identified. IMPRESSION: 1. Irregular mass of segment 8 of the liver with perilesional enhancement that gradually fills in on more delayed imaging, concerning for cholangiocarcinoma. 2. Diffuse mild intrahepatic biliary ductal dilation with wall thickening of the common bile duct. Findings are concerning for tumor extension along the common bile duct. 3. Gallbladder is thick-walled and decompressed, concerning for additional site of tumor extension. Electronically Signed   By: Yetta Glassman M.D.   On: 08/14/2021 12:22   MR 3D Recon At Scanner  Result Date: 08/14/2021 CLINICAL DATA:  Jaundice EXAM: MRI ABDOMEN WITHOUT AND WITH CONTRAST (INCLUDING MRCP) TECHNIQUE: Multiplanar multisequence MR imaging of the abdomen was performed both before and after the administration of intravenous contrast. Heavily T2-weighted images of the biliary and pancreatic ducts were obtained, and three-dimensional MRCP images were rendered by post processing. CONTRAST:  65m GADAVIST GADOBUTROL 1 MMOL/ML IV SOLN COMPARISON:  CT abdomen and pelvis dated August 13, 2021 FINDINGS: Lower chest: No acute findings. Hepatobiliary: Irregular mass of segment 8 liver measuring 2.9 x 2.8 cm on series 3, image 14 with intermediate T2 signal, low T1 signal and perilesional enhancement that gradually fills in on more delayed imaging. Diffuse mild intrahepatic biliary ductal dilation with more focal intrahepatic ductal dilation seen distal to the mass. Normal caliber common bile duct with wall thickening. No choledocholithiasis. Gallbladder is decompressed. Pancreas: No mass, inflammatory changes, or other parenchymal abnormality identified. Spleen:  Within normal limits  in size and appearance. Adrenals/Urinary Tract: Bilateral adrenal glands are unremarkable. 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 Stomach/Bowel: Visualized portions within the abdomen are unremarkable. Vascular/Lymphatic: No pathologically enlarged lymph nodes identified. No abdominal aortic aneurysm demonstrated. Other:  None. Musculoskeletal: No suspicious bone lesions identified. IMPRESSION: 1. Irregular mass of segment 8 of the liver with perilesional enhancement that gradually fills in on more delayed imaging, concerning for cholangiocarcinoma. 2. Diffuse mild intrahepatic biliary ductal dilation with wall thickening of the common bile duct. Findings are concerning for tumor extension along the common bile duct. 3. Gallbladder is thick-walled and decompressed, concerning for additional site of tumor extension. Electronically Signed   By: LYetta GlassmanM.D.   On: 08/14/2021 12:22   CT ABDOMEN PELVIS W CONTRAST  Result Date: 08/13/2021 CLINICAL DATA:  Abdominal pain and jaundice. EXAM: CT ABDOMEN AND PELVIS WITH CONTRAST TECHNIQUE: Multidetector CT imaging of the abdomen and pelvis was performed using the standard protocol following bolus administration of intravenous contrast. RADIATION DOSE REDUCTION: This exam was performed according to the departmental dose-optimization program which includes automated exposure control, adjustment of the mA and/or kV according to patient size  and/or use of iterative reconstruction technique. CONTRAST:  125m OMNIPAQUE IOHEXOL 300 MG/ML  SOLN COMPARISON:  None Available. FINDINGS: Lower chest: No acute abnormality. The cardiac size is normal. Small hiatal hernia. Hepatobiliary: Liver is 18 cm length slightly steatotic. In hepatic segment 5 near the liver hilum there is a heterogeneous masslike area of hypoenhancement measuring 2.9 x 2.7 cm on series 2 axial image 19 and 2.7 cm height on coronal reconstruction image 67. There is encasement and  apparent complete effacement of the anterior division of the right portal vein associated with this, intrahepatic biliary dilatation above this in segment 8, and intrahepatic biliary dilatation in both segments of the left lobe. The hepatic ductal confluence is just below this and is not well seen but is suspected to be obstructed for the most part. There is thickening of the wall in the common hepatic duct and probably also in the cystic duct with gallbladder mostly contracted but potentially thickened and with pericholecystic fluid also seen. No calcified stone is evident. The common bile duct is normal caliber below this level and it is unremarkable and not thickened. Primary bile duct neoplasm and primary hepatic neoplasm with secondary biliary obstruction are the most likely possibilities. An inflammatory/infectious process with phlegmon causing a masslike abnormality is possible but this should be considered a neoplasm until proven otherwise. No other focal liver abnormality is seen. The main portal vein and other portal venous branches opacify well with prominent main portal vein measuring 15.4 mm. Pancreas: No focal abnormality. No ductal dilatation or inflammatory change. Spleen: Mildly prominent, 13.1 cm AP, no mass enhancement. Adrenals/Urinary Tract: There is no adrenal mass. 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 There is no renal mass. No urinary stone or obstruction is evident. There is mild general thickening of the bladder without inflammatory change. Stomach/Bowel: Small hiatal hernia. No bowel dilatation or wall thickening including the appendix. Moderate fecal stasis particularly in the ascending colon is seen, scattered diverticula left-sided colon without diverticulitis. Vascular/Lymphatic: There is mild aortic atherosclerosis without AAA. As above the anterior division of the right portal vein does not opacify and suspected encased and effaced. There is a normal  IVC and deep pelvic veins, normal SMV and splenic vein enhancement. There are scattered periportal and portacaval retroperitoneal lymph nodes up to 7 mm in short axis but no area lymph nodes which exceed pathologic size criteria. There is no gastrohepatic ligament or other adenopathy , no mesenteric or pelvic mass is seen. Reproductive: Enlarged prostate, 5.2 cm transverse with bladder base impression. Multiple phleboliths both pelvic sidewalls. Other: Small umbilical and inguinal fat hernias. No incarcerated hernia. No free air, hemorrhage or ascites. Musculoskeletal: Advanced degenerative disc disease and grade 1 retrolisthesis and spondylosis L5-S1 with acquired spinal stenosis, acquired moderate foraminal stenosis. Schmorl's nodes lower thoracic spine. No destructive bone lesion is seen. IMPRESSION: 1. 2.9 x 2.7 x 2.7 cm hypoenhancing mass in segment 5 near the liver hilum just above the hepatic ductal confluence, suspected either primary bile duct malignancy or primary hepatic malignancy with secondary biliary obstruction. 2. The common hepatic duct is thickened and there is intrahepatic biliary dilatation throughout the left lobe and above the mass in segment 8. 3. Possible an inflammatory/infectious process could produce these findings but this should be considered neoplasm until proven otherwise. 4. There appears to be additional thickening of the cystic duct and there is contraction and possible thickening of the gallbladder with pericholecystic fluid. Coexisting cholecystitis is not excluded. No calcified stone  or common bile duct dilatation is seen. 5. No appreciable opacification in the anterior branch of the right portal vein which is suspected encased and effaced by the process. There is mild prominence of the main portal vein with remainder of the portal vein branches opacifying well. 6. Subcentimeter periportal and portacaval space lymph nodes up to 7 mm in short axis but no lymph nodes exceeding the  pathologic size criteria. 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 8. Slight splenomegaly, slight hepatic steatosis. 9. Constipation and diverticulosis. 10. Enlarged prostate impressing on the posterior bladder with mild bladder thickening most likely due to hypertrophy. 11. Umbilical and inguinal fat hernias. Additional findings described above. Electronically Signed   By: Telford Nab M.D.   On: 08/13/2021 20:41        Scheduled Meds:  metoprolol tartrate  5 mg Intravenous Q12H   pantoprazole (PROTONIX) IV  40 mg Intravenous Q24H   Continuous Infusions:  sodium chloride 100 mL/hr at 08/14/21 2251     LOS: 1 day    Time spent: 35 minutes.     Elmarie Shiley, MD [facility]   If 7PM-7AM, please contact night-coverage [email or web address]  08/15/2021, 7:34 AM

## 2021-08-15 NOTE — Consult Note (Addendum)
Chief Complaint: Patient was seen in consultation today for liver mass and obstructive jaundice  Referring Physician(s): Drs. [place]  History of Present Illness: Jeffrey Blevins is a 56 y.o. male with history of Barrett's esophagus, hiatal hernia, hypertension, and Raynauds who presents to [facility] with jaundice and abdominal discomfort.  He has had mild abdominal discomfort for months and on 08/11/21 noticed his skin turning yellow.  He endorses mild abdominal discomfort, clay colored stools, and dark brown urine.  No fevers or chills.  He presented to the ED on 08/13/21 and CT AP was performed which demonstrated a hilar liver mass and bilateral bilary ductal dilation.  MRI was performed for further evaluation which revealed similar.    Past Medical History:  Diagnosis Date   GERD (gastroesophageal reflux disease)    Hiatal hernia    History of Barrett's esophagus    per pt dx yrs ago, but with last egd none noted 05/ 2021   History of esophageal dilatation    per pt hx several times last one approx. 2009 then 05/ 2021  for stricture   Hypertension    followed by pcp   Raynauds phenomenon    Right hydrocele     Past Surgical History:  Procedure Laterality Date   COLONOSCOPY WITH ESOPHAGOGASTRODUODENOSCOPY (EGD)  07/16/2019   last one   INGUINAL HERNIA REPAIR Bilateral    last one 1990s   SPERMATOCELECTOMY Right 10/09/2020   Procedure: SPERMATOCELECTOMY;  Surgeon: Festus Aloe, MD;  Location: [facility];  Service: Urology;  Laterality: Right;    Allergies: Codeine  Medications: Prior to Admission medications   Medication Sig Start Date End Date Taking? Authorizing Provider  acetaminophen (TYLENOL) 500 MG tablet Take 1,000 mg by mouth 3 (three) times daily as needed for headache (pain).   Yes [provider]  amLODipine (NORVASC) 10 MG tablet Take 10 mg by mouth every morning.   Yes [provider]  esomeprazole (NEXIUM) 20 MG  capsule Take 20 mg by mouth every morning.   Yes [provider]  ibuprofen (ADVIL) 200 MG tablet Take 400 mg by mouth 3 (three) times daily as needed for headache (pain).   Yes [provider]  irbesartan (AVAPRO) 150 MG tablet Take 150 mg by mouth at bedtime.   Yes [provider]     Family History  Problem Relation Age of Onset   Breast cancer Mother    Stroke Father    Breast cancer Sister    Cardiomyopathy Brother        Cardiomegaly    Social History   Socioeconomic History   Marital status: Married    Spouse name: Not on file   Number of children: Not on file   Years of education: Not on file   Highest education level: Not on file  Occupational History   Not on file  Tobacco Use   Smoking status: Some Days    Years: 5.00    Types: Cigarettes   Smokeless tobacco: Never   Tobacco comments:    10-06-2020  per pt 1pp 7days  Vaping Use   Vaping Use: Never used  Substance and Sexual Activity   Alcohol use: Yes    Alcohol/week: 4.0 standard drinks of alcohol    Types: 4 Cans of beer per week   Drug use: Never   Sexual activity: Not on file  Other Topics Concern   Not on file  Social History Narrative   Not  on file   Social Determinants of Health   Financial Resource Strain: Not on file  Food Insecurity: Not on file  Transportation Needs: Not on file  Physical Activity: Not on file  Stress: Not on file  Social Connections: Not on file   Review of Systems: A 12 point ROS discussed and pertinent positives are indicated in the HPI above.  All other systems are negative.  Vital Signs: BP (!) 142/86 (BP Location: Left Arm)   Pulse (!) 57   Temp 98.6 F (37 C) (Oral)   Resp 18   Ht '6\' 2"'$  (1.88 m)   Wt 95.3 kg   SpO2 98%   BMI 26.96 kg/m     Physical Exam Constitutional:      General: He is not in acute distress. HENT:     Head: Normocephalic.     Mouth/Throat:     Mouth: Mucous membranes are moist.     Comments:  MP2  Eyes:     General: Scleral icterus present.  Cardiovascular:     Rate and Rhythm: Normal rate and regular rhythm.  Pulmonary:     Breath sounds: Normal breath sounds.  Abdominal:     General: There is no distension.     Tenderness: There is no abdominal tenderness.  Skin:    General: Skin is warm and dry.     Coloration: Skin is jaundiced.  Neurological:     Mental Status: He is alert and oriented to person, place, and time.     Imaging: CT AP 08/13/21  ~3 cm hilar mass with bilateral moderate biliary ductal dilation.  MRCP 08/14/21  Enhancing right/hilar mass with bilateral biliary dilation.  Labs:  CBC: Recent Labs    10/09/20 1041 08/13/21 1618 08/14/21 0749 08/15/21 0535  WBC  --  5.1 6.1 4.7  HGB 15.3 13.1 13.8 12.6*  HCT 45.0 38.4* 40.0 37.7*  PLT  --  220 264 233    COAGS: Recent Labs    08/13/21 1945  INR 0.9    BMP: Recent Labs    10/09/20 1041 08/13/21 1618 08/14/21 0749 08/15/21 0535  NA 140 131* 137 138  K 4.0 3.8 3.9 4.0  CL 106 98 106 108  CO2  --  20* 22 22  GLUCOSE 91 102* 101* 108*  BUN '15 17 17 18  '$ CALCIUM  --  10.2 9.5 8.9  CREATININE 1.00 1.29* 0.73 0.91  GFRNONAA  --  >60 >60 >60    LIVER FUNCTION TESTS: Recent Labs    08/13/21 1618 08/13/21 1945 08/14/21 0749 08/15/21 0535  BILITOT 9.8* 9.6* 11.6* 10.5*  AST 115* 104* 117* 108*  ALT 268* 246* 257* 210*  ALKPHOS 283* 263* 319* 279*  PROT 7.8 7.2 7.6 6.5  ALBUMIN 4.7 4.4 4.4 3.6    TUMOR MARKERS: No results for input(s): "AFPTM", "CEA", "CA199", "CHROMGRNA" in the last 8760 hours.  Assessment and Plan: 56 year old male with right hepatic/hilar mass and bilateral biliary ductal dilation with worsening obstructive jaundice, no evidence of cholangitis.  Discussed his image findings and further work up to include tissue sampling in addition to decompression of biliary tree.    Right/hilar mass would likely be amenable to ultrasound guided percutaneous biopsy.     Given location of biliary obstruction, he would likely need bilateral internal/external biliary drains for complete decompression.    This was discussed in depth with the patient.  He ate breakfast and is not currently NPO, and would like to  have both of these procedures performed at once if possible, which I think is reasonable.    Plan for bilateral percutaneous transhepatic cholangiogram and biliary drain placement in addition to ultrasound guided right hepatic mass biopsy in Interventional Radiology on 08/16/21.    Please make NPO after midnight.  Risks and benefits of biliary drain placement and liver biopsy discussed with the patient including, but not limited to bleeding, infection which may lead to sepsis or even death and damage to adjacent structures.  Formal consent will need to be obtained on day of procedure.    Electronically Signed: Suzette Battiest, MD 08/15/2021, 11:44 AM   I spent a total of 72 [place]   in face to face in clinical consultation, greater than 50% of which was counseling/coordinating care for malignant biliary obstruction.

## 2021-08-16 ENCOUNTER — Inpatient Hospital Stay (HOSPITAL_COMMUNITY): Payer: Managed Care, Other (non HMO)

## 2021-08-16 DIAGNOSIS — K831 Obstruction of bile duct: Secondary | ICD-10-CM | POA: Diagnosis not present

## 2021-08-16 HISTORY — PX: IR INT EXT BILIARY DRAIN WITH CHOLANGIOGRAM: IMG6044

## 2021-08-16 LAB — COMPREHENSIVE METABOLIC PANEL
ALT: 212 U/L — ABNORMAL HIGH (ref 0–44)
AST: 112 U/L — ABNORMAL HIGH (ref 15–41)
Albumin: 3.7 g/dL (ref 3.5–5.0)
Alkaline Phosphatase: 271 U/L — ABNORMAL HIGH (ref 38–126)
Anion gap: 9 (ref 5–15)
BUN: 15 mg/dL (ref 6–20)
CO2: 23 mmol/L (ref 22–32)
Calcium: 9.2 mg/dL (ref 8.9–10.3)
Chloride: 108 mmol/L (ref 98–111)
Creatinine, Ser: 0.76 mg/dL (ref 0.61–1.24)
GFR, Estimated: >60 mL/min (ref >60)
Glucose, Bld: 107 mg/dL — ABNORMAL HIGH (ref 70–99)
Potassium: 3.7 mmol/L (ref 3.5–5.1)
Sodium: 140 mmol/L (ref 135–145)
Total Bilirubin: 11.5 mg/dL — ABNORMAL HIGH (ref 0.3–1.2)
Total Protein: 6.7 g/dL (ref 6.5–8.1)

## 2021-08-16 LAB — CBC
HCT: 37.8 % — ABNORMAL LOW (ref 39.0–52.0)
Hemoglobin: 12.9 g/dL — ABNORMAL LOW (ref 13.0–17.0)
MCH: 31.5 pg (ref 26.0–34.0)
MCHC: 34.1 g/dL (ref 30.0–36.0)
MCV: 92.2 fL (ref 80.0–100.0)
Platelets: 253 10*3/uL (ref 150–400)
RBC: 4.1 MIL/uL — ABNORMAL LOW (ref 4.22–5.81)
RDW: 14 % (ref 11.5–15.5)
WBC: 5.7 10*3/uL (ref 4.0–10.5)
nRBC: 0 % (ref 0.0–0.2)

## 2021-08-16 LAB — PROTIME-INR
INR: 0.8 (ref 0.8–1.2)
Prothrombin Time: 11.2 seconds — ABNORMAL LOW (ref 11.4–15.2)

## 2021-08-16 LAB — HEPATITIS PANEL, ACUTE
HCV Ab: NONREACTIVE
Hep A IgM: NONREACTIVE
Hep B C IgM: NONREACTIVE
Hepatitis B Surface Ag: NONREACTIVE

## 2021-08-16 MED ORDER — MORPHINE SULFATE (PF) 2 MG/ML IV SOLN
1.0000 mg | INTRAVENOUS | Status: DC | PRN
  Administered 2021-08-16: 1 mg via INTRAVENOUS
  Filled 2021-08-16: qty 1

## 2021-08-16 MED ORDER — LIDOCAINE HCL 1 % IJ SOLN
INTRAMUSCULAR | Status: AC
  Filled 2021-08-16: qty 40

## 2021-08-16 MED ORDER — MIDAZOLAM HCL 2 MG/2ML IJ SOLN
INTRAMUSCULAR | Status: AC | PRN
  Administered 2021-08-16: .5 mg via INTRAVENOUS
  Administered 2021-08-16 (×4): 1 mg via INTRAVENOUS
  Administered 2021-08-16: .5 mg via INTRAVENOUS
  Administered 2021-08-16: 1 mg via INTRAVENOUS

## 2021-08-16 MED ORDER — HYDROMORPHONE HCL 1 MG/ML IJ SOLN
0.5000 mg | INTRAMUSCULAR | Status: DC | PRN
  Administered 2021-08-16 – 2021-08-25 (×15): 0.5 mg via INTRAVENOUS
  Filled 2021-08-16 (×15): qty 0.5

## 2021-08-16 MED ORDER — LIDOCAINE HCL 1 % IJ SOLN
INTRAMUSCULAR | Status: AC | PRN
  Administered 2021-08-16: 20 mL via INTRADERMAL

## 2021-08-16 MED ORDER — FENTANYL CITRATE (PF) 100 MCG/2ML IJ SOLN
INTRAMUSCULAR | Status: AC
  Filled 2021-08-16: qty 4

## 2021-08-16 MED ORDER — SODIUM CHLORIDE 0.9 % IV SOLN
2.0000 g | Freq: Once | INTRAVENOUS | Status: AC
  Administered 2021-08-16: 2 g via INTRAVENOUS
  Filled 2021-08-16: qty 20

## 2021-08-16 MED ORDER — MIDAZOLAM HCL 2 MG/2ML IJ SOLN
INTRAMUSCULAR | Status: AC
  Filled 2021-08-16: qty 4

## 2021-08-16 MED ORDER — IOHEXOL 300 MG/ML  SOLN
50.0000 mL | Freq: Once | INTRAMUSCULAR | Status: AC | PRN
  Administered 2021-08-16: 20 mL

## 2021-08-16 MED ORDER — FENTANYL CITRATE (PF) 100 MCG/2ML IJ SOLN
INTRAMUSCULAR | Status: AC
  Filled 2021-08-16: qty 2

## 2021-08-16 MED ORDER — MORPHINE SULFATE (PF) 2 MG/ML IV SOLN: 1.0000 mg | Freq: Once | INTRAVENOUS | Status: DC

## 2021-08-16 MED ORDER — MIDAZOLAM HCL 2 MG/2ML IJ SOLN
INTRAMUSCULAR | Status: AC
  Filled 2021-08-16: qty 2

## 2021-08-16 MED ORDER — OXYCODONE HCL 5 MG PO TABS
5.0000 mg | ORAL_TABLET | Freq: Four times a day (QID) | ORAL | Status: DC | PRN
  Administered 2021-08-16 – 2021-08-22 (×13): 5 mg via ORAL
  Filled 2021-08-16 (×13): qty 1

## 2021-08-16 MED ORDER — FENTANYL CITRATE (PF) 100 MCG/2ML IJ SOLN
INTRAMUSCULAR | Status: AC | PRN
  Administered 2021-08-16 (×5): 50 ug via INTRAVENOUS

## 2021-08-16 NOTE — Progress Notes (Signed)
PROGRESS NOTE    Jeffrey Blevins  KCL:[id number] DOB: Dec 12, 1965 DOA: 08/13/2021 PCP: Percell Belt, DO   Brief Narrative: 56 year old with past medical history significant for GERD, Barrett's esophagus, history of esophageal stricture status post dilation, hypertension, Raynaud's phenomena, right hydrocephaly with history of a spermatocelectomy presents with Jaundice.  He noticed dark urine since 5 days prior to admission.  He is started to drink more water.  Subsequently he noticed his stool was very light in color and his skin became yellow.  He presented for further evaluation.  He was found to have normal lipase, sodium 131, creatinine 1.2, AST: 115, ALT 268, alkaline phosphatase 283, bilirubin 9.8.  - CT abdomen and pelvis showed 2.9 x 2.7 x 2.7 cm hypoenhancing mass in the segment 5 near the liver hilum just above the hepatic ductal confluence, suspected either primary bile duct malignancy or primary hepatic malignancy with secondary biliary obstruction.  The common hepatic duct is thickened and there is intrahepatic biliary dilation throughout the left lobe and above the mass in segment 8. There appears to be additional thickening of the cystic duct and there is contraction and possible thickening of the gallbladder with pericholecystic fluid. Coexisting cholecystitis is not excluded. No calcified stone or common bile duct dilatation is seen.  Assessment & Plan:   Principal Problem:   Biliary obstruction Active Problems:   Tobacco use   Hypertension   Hyponatremia   Prostate enlargement   Hepatic steatosis   GERD (gastroesophageal reflux disease)  1-Obstructive Jaundice: Transaminases, Hyperbilirubinemia:  Secondary to obstructive liver mass.  -CT abdomen Pelvis: 2.9 x 2.7 x 2.7 cm hypoenhancing mass in the segment 5 near the liver hilum just above the hepatic ductal confluence,  The common hepatic duct is thickened and there is intrahepatic biliary dilation throughout the left lobe  and above the mass in segment 8. -MRCP: Irregular mass of segment 8 of the liver with perilesional enhancement that gradually fills in on more delayed imaging, concerning for cholangiocarcinoma. Diffuse mild intrahepatic biliary ductal dilation with wall thickening of the common bile duct. Findings are concerning for tumor extension along the common bile duct. Gallbladder is thick-walled and decompressed, concerning for additional site of tumor extension. -IR consulted for evaluation and treatment.  -GI following, Dr Benson [place] primary.  - alfa fetoprotein, Viral Hepatitis  panel negative,  Ca 19 76 -Underwent PTC biliary drain placement by  placement by IR.  Cytology sent from biliary drain fluid.  Will need to reassess for image-guided percutaneous biopsy versus duct brushing He is having pain, started low dose IV dilaudid, Oxycodone.  Will need to continue IV fluids, prevent dehydration. Follow LFT in am.   GERD:  -Continue with PPI>   Tobacco Use:  Counseling.   HTN:  DC Metoprolol, bradycardia.  Resume norvasc.  Hold Avapro due to hyponatremia and avoid dehydration in setting of biliary drain placement.   Hyponatremia:  Hypovolemia;  On IV fluids.  Resolved.   Prostate Enlargement.  Started Tamsulosin.  Needs to follow up with urology out patient.   Anxiety; started  low dose xanax.     Estimated body mass index is 26.96 kg/m as calculated from the following:   Height as of this encounter: _0  (1.88 m).   Weight as of this encounter: 95.3 kg.   DVT prophylaxis: SCD, he will need Bx.  Code Status: Full code Family Communication: family at bedside.  Disposition Plan:  Status is: Inpatient Remains inpatient appropriate because: obstructive Jaundice.  Consultants:  GI  Procedures:  MRCP  Antimicrobials:  None  Subjective: He came from procedure, drain was placed. Biopsy was not done. He is having pain at site of drain placement.   Objective: Vitals:    08/16/21 1032 08/16/21 1035 08/16/21 1045 08/16/21 1237  BP:  128/85 127/81 (!) 162/92  Pulse:   (!) 56 66  Resp: _0 Temp:    98.9 F (37.2 C)  TempSrc:    Oral  SpO2:   100% 100%  Weight:      Height:        Intake/Output Summary (Last 24 hours) at 08/16/2021 1813 Last data filed at 08/16/2021 0836 Gross per 24 hour  Intake 0 ml  Output 700 ml  Net -700 ml    Filed Weights   08/13/21 1604  Weight: 95.3 kg    Examination:  General exam: NAD, Icteric  Respiratory system: CTA Cardiovascular system: S 1, S 2 RRR Gastrointestinal system: BS present, soft, nt Central nervous system: Non focal Extremities: no edema.   Data Reviewed: I have personally reviewed following labs and imaging studies  CBC: Recent Labs  Lab 08/13/21 1618 08/14/21 0749 08/15/21 0535 08/16/21 0437  WBC 5.1 6.1 4.7 5.7  HGB 13.1 13.8 12.6* 12.9*  HCT 38.4* 40.0 37.7* 37.8*  MCV 90.4 90.9 92.6 92.2  PLT 220 264 233 101    Basic Metabolic Panel: Recent Labs  Lab 08/13/21 1618 08/14/21 0749 08/15/21 0535 08/16/21 0437  NA 131* 137 138 140  K 3.8 3.9 4.0 3.7  CL 98 106 108 108  CO2 20* _1 GLUCOSE 102* 101* 108* 107*  BUN _2 CREATININE 1.29* 0.73 0.91 0.76  CALCIUM 10.2 9.5 8.9 9.2  MG  --  2.2  --   --   PHOS  --  3.9  --   --     GFR: Estimated Creatinine Clearance: 119.9 mL/min (by C-G formula based on SCr of 0.76 mg/dL). Liver Function Tests: Recent Labs  Lab 08/13/21 1618 08/13/21 1945 08/14/21 0749 08/15/21 0535 08/16/21 0437  AST 115* 104* 117* 108* 112*  ALT 268* 246* 257* 210* 212*  ALKPHOS 283* 263* 319* 279* 271*  BILITOT 9.8* 9.6* 11.6* 10.5* 11.5*  PROT 7.8 7.2 7.6 6.5 6.7  ALBUMIN 4.7 4.4 4.4 3.6 3.7    Recent Labs  Lab 08/13/21 1618  LIPASE 44    No results for input(s): "AMMONIA" in the last 168 hours. Coagulation Profile: Recent Labs  Lab 08/13/21 1945 08/16/21 0437  INR 0.9 0.8    Cardiac Enzymes: No results  for input(s): "CKTOTAL", "CKMB", "CKMBINDEX", "TROPONINI" in the last 168 hours. BNP (last 3 results) No results for input(s): "PROBNP" in the last 8760 hours. HbA1C: No results for input(s): "HGBA1C" in the last 72 hours. CBG: No results for input(s): "GLUCAP" in the last 168 hours. Lipid Profile: No results for input(s): "CHOL", "HDL", "LDLCALC", "TRIG", "CHOLHDL", "LDLDIRECT" in the last 72 hours. Thyroid Function Tests: No results for input(s): "TSH", "T4TOTAL", "FREET4", "T3FREE", "THYROIDAB" in the last 72 hours. Anemia Panel: No results for input(s): "VITAMINB12", "FOLATE", "FERRITIN", "TIBC", "IRON", "RETICCTPCT" in the last 72 hours. Sepsis Labs: No results for input(s): "PROCALCITON", "LATICACIDVEN" in the last 168 hours.  No results found for this or any previous visit (from the past 240 hour(s)).       Radiology Studies: IR INT EXT BILIARY DRAIN WITH CHOLANGIOGRAM  Result Date: 08/16/2021 INDICATION: 56 year old with  obstructive jaundice likely secondary to malignant neoplasm. Patient is being evaluated for percutaneous biliary drainage and percutaneous biopsy. EXAM: 1. Percutaneous transhepatic cholangiogram with ultrasound and fluoroscopic guidance 2. Placement of internal/external biliary drain with ultrasound and fluoroscopic guidance MEDICATIONS: Rocephin 2 g; The antibiotic was administered within an appropriate time frame prior to the initiation of the procedure. ANESTHESIA/SEDATION: Moderate (conscious) sedation was employed during this procedure. A total of Versed 6.0 mg and Fentanyl 250 mcg was administered intravenously by the radiology nurse. Total intra-service moderate Sedation Time: 54 minutes. The patient's level of consciousness and vital signs were monitored continuously by radiology nursing throughout the procedure under my direct supervision. FLUOROSCOPY: Radiation Exposure Index (as provided by the fluoroscopic device): 646 mGy Kerma COMPLICATIONS: None  immediate. PROCEDURE: Informed written consent was obtained from the patient after a thorough discussion of the procedural risks, benefits and alternatives. All questions were addressed. Maximal Sterile Barrier Technique was utilized including caps, mask, sterile gowns, sterile gloves, sterile drape, hand hygiene and skin antiseptic. A timeout was performed prior to the initiation of the procedure. Patient was placed supine on the interventional table. The anterior and right side of the abdomen was prepped and draped in sterile fashion. Ultrasound was used to identify dilated ducts in the left hepatic lobe. The anterior abdomen was anesthetized with 1% lidocaine. Using ultrasound guidance, a 21 gauge needle was directed into a dilated peripheral duct and percutaneous transhepatic cholangiogram was performed. Multiple attempts were made to advance a wire through this access but this was not successful due to tortuosity of the bile duct. Therefore, attention was directed to the right hepatic lobe. A dilated peripheral right hepatic duct was identified with ultrasound and targeted. The right side of the abdomen was anesthetized with 1% lidocaine. Using ultrasound guidance, 21 gauge needle was directed into the dilated peripheral duct and contrast injection confirmed placement in the biliary system. A 0.018 wire was advanced into the central hepatic ducts and a transitional dilator set was successfully placed. Additional cholangiogram images were obtained. Eventually this access was upsized to a 5 [place] Kumpe catheter over a J wire. Initially, the common hepatic duct was not identified but eventually a small amount of contrast was noted within the common hepatic duct and this was successfully cannulated with a Roadrunner wire. The Kumpe catheter was eventually advanced into the duodenum using a Bentson wire. The tract was dilated and a 10 [place] biliary drain was advanced over the wire. The distal aspect of the drain  was placed in the duodenum. Contrast injection confirmed appropriate placement in the biliary system. A sample of bile was collected for cytology. Drain was flushed with saline and attached to a gravity bag. Bandage was placed. FINDINGS: Bilateral intrahepatic biliary dilatation identified with ultrasound. The lesion seen on CT and MRI is inconspicuous with ultrasound. Left hepatic duct was initially punctured using ultrasound guidance and cholangiogram demonstrated filling of dilated left hepatic ducts and at least 1 right hepatic duct. Second percutaneous transhepatic cholangiogram demonstrated filling of a dilated right hepatic duct and additional dilated intrahepatic ducts were identified. Eventually, the common hepatic duct and the common bile duct were identified. No significant dilatation of the extrahepatic biliary system. The area of obstruction is at the biliary confluence. A drainage catheter was successfully advanced to the duodenum. Final cholangiogram images demonstrate that majority of the intrahepatic bile ducts were opacified. There is a small segment of the right hepatic lobe which is not opacifying and likely obstructed from neoplasm and this  finding was confirmed with ultrasound. IMPRESSION: 1. Successful percutaneous transhepatic cholangiogram and placement of an internal/external biliary drain via a right hepatic duct. Drain was successfully advanced into the duodenum. The obstruction is at the biliary confluence. 2. Hepatic lesion seen on recent CT and MRI is poorly visualized with ultrasound. Small segment of the right hepatic lobe appears to have persistent biliary dilatation after drain placement and suspect that the mass lesion is in this area. 3. Fluid was sent for cytology. Depending on the results of the cytology, we may need to re-assess for image guided percutaneous biopsy of the liver lesion versus brush biopsy at the biliary confluence. Electronically Signed   By: Markus Daft M.D.    On: 08/16/2021 17:30        Scheduled Meds:  amLODipine  10 mg Oral q morning   fentaNYL       fentaNYL       midazolam       midazolam       pantoprazole (PROTONIX) IV  40 mg Intravenous Q24H   Continuous Infusions:  sodium chloride 100 mL/hr at 08/16/21 1729     LOS: 2 days    Time spent: 35 minutes.     Elmarie Shiley, MD [facility]   If 7PM-7AM, please contact night-coverage [email or web address]  08/16/2021, 6:13 PM

## 2021-08-16 NOTE — Progress Notes (Signed)
  Transition of Care (TOC) Screening Note   Patient Details  Name: Jeffrey Blevins Date of Birth: 21-Sep-1965   Transition of Care [facility]) CM/SW Contact:    Vassie Moselle, LCSW Phone Number: 08/16/2021, 9:40 AM    Transition of Care Department [facility]) has reviewed patient and no TOC needs have been identified at this time. We will continue to monitor patient advancement through interdisciplinary progression rounds. If new patient transition needs arise, please place a TOC consult.

## 2021-08-16 NOTE — Procedures (Signed)
Interventional Radiology Procedure:   Indications: Obstructive jaundice, likely secondary to malignant neoplasm  Procedure: PTC and biliary drain placement  Findings: Dilated biliary system.  PTC performed from left hepatic duct but unable to advance wire thru left biliary ducts.  Dilated right hepatic duct punctured with [place] and wire was advanced into central bile ducts.  Eventually, the CHD was identified and catheter advanced into CBD and duodenum.  10 Fr biliary drain placed with tip in duodenum.  Bile sample collected for cytology.  US demonstrated a small segment of right lobe still with dilated ducts after drain placed.  Did not feel that patient could tolerate another drain at this time.  Drain attached to gravity bag.  Complications: No immediate complications noted.     EBL: Minimal  Plan: Biliary drain to gravity bag.  It appears that this drain will decompress at least 75% of intrahepatic bile ducts.  Follow labs and will need to re-assess for imaged guided percutaneous biopsy vs. duct brushing.     Alonnah Lampkins R. Anselm Pancoast, MD  Pager: [phone]

## 2021-08-16 NOTE — Plan of Care (Signed)
Problem: Education: Goal: Knowledge of General Education information will improve Description: Including pain rating scale, medication(s)/side effects and non-pharmacologic comfort measures Outcome: Progressing   Problem: Clinical Measurements: Goal: Ability to maintain clinical measurements within normal limits will improve Outcome: Progressing   Problem: Pain Managment: Goal: General experience of comfort will improve Outcome: [place], RN 08/16/21 8:45 PM

## 2021-08-17 ENCOUNTER — Encounter (HOSPITAL_COMMUNITY): Payer: Self-pay | Admitting: Internal Medicine

## 2021-08-17 DIAGNOSIS — K831 Obstruction of bile duct: Secondary | ICD-10-CM | POA: Diagnosis not present

## 2021-08-17 LAB — COMPREHENSIVE METABOLIC PANEL
ALT: 184 U/L — ABNORMAL HIGH (ref 0–44)
AST: 90 U/L — ABNORMAL HIGH (ref 15–41)
Albumin: 3.4 g/dL — ABNORMAL LOW (ref 3.5–5.0)
Alkaline Phosphatase: 274 U/L — ABNORMAL HIGH (ref 38–126)
Anion gap: 8 (ref 5–15)
BUN: 11 mg/dL (ref 6–20)
CO2: 24 mmol/L (ref 22–32)
Calcium: 9.1 mg/dL (ref 8.9–10.3)
Chloride: 107 mmol/L (ref 98–111)
Creatinine, Ser: 0.83 mg/dL (ref 0.61–1.24)
GFR, Estimated: >60 mL/min (ref >60)
Glucose, Bld: 126 mg/dL — ABNORMAL HIGH (ref 70–99)
Potassium: 3.6 mmol/L (ref 3.5–5.1)
Sodium: 139 mmol/L (ref 135–145)
Total Bilirubin: 10.3 mg/dL — ABNORMAL HIGH (ref 0.3–1.2)
Total Protein: 6.4 g/dL — ABNORMAL LOW (ref 6.5–8.1)

## 2021-08-17 LAB — CBC
HCT: 34.8 % — ABNORMAL LOW (ref 39.0–52.0)
Hemoglobin: 12 g/dL — ABNORMAL LOW (ref 13.0–17.0)
MCH: 31.7 pg (ref 26.0–34.0)
MCHC: 34.5 g/dL (ref 30.0–36.0)
MCV: 91.8 fL (ref 80.0–100.0)
Platelets: 246 10*3/uL (ref 150–400)
RBC: 3.79 MIL/uL — ABNORMAL LOW (ref 4.22–5.81)
RDW: 13.9 % (ref 11.5–15.5)
WBC: 7.6 10*3/uL (ref 4.0–10.5)
nRBC: 0 % (ref 0.0–0.2)

## 2021-08-17 LAB — AFP TUMOR MARKER: AFP, Serum, Tumor Marker: 6.4 ng/mL (ref 0.0–8.4)

## 2021-08-17 LAB — CYTOLOGY - NON PAP

## 2021-08-17 MED ORDER — SENNA 8.6 MG PO TABS
1.0000 | ORAL_TABLET | Freq: Every day | ORAL | Status: DC
Start: 2021-08-17 — End: 2021-08-21
  Administered 2021-08-17 – 2021-08-20 (×4): 8.6 mg via ORAL
  Filled 2021-08-17 (×5): qty 1

## 2021-08-17 MED ORDER — POLYETHYLENE GLYCOL 3350 17 G PO PACK
17.0000 g | PACK | Freq: Every day | ORAL | Status: DC
  Administered 2021-08-17 – 2021-08-20 (×2): 17 g via ORAL
  Filled 2021-08-17 (×4): qty 1

## 2021-08-17 NOTE — Progress Notes (Signed)
Supervising Physician: Markus Daft  Patient Status:  [facility] - In-pt  Chief Complaint:  S/p percutaneous transhepatic cholangiogram and placement of an internal/external biliary drain via a right hepatic duct with Dr. Anselm Pancoast 08/16/21.   Subjective:  Pt resting in bed with wife at bedside.  He endorses intermittent abdominal pain. Denies N/V, fevers.  He is in no distress.    Allergies: Codeine  Medications: Prior to Admission medications   Medication Sig Start Date End Date Taking? Authorizing Provider  acetaminophen (TYLENOL) 500 MG tablet Take 1,000 mg by mouth 3 (three) times daily as needed for headache (pain).   Yes [provider]  amLODipine (NORVASC) 10 MG tablet Take 10 mg by mouth every morning.   Yes [provider]  esomeprazole (NEXIUM) 20 MG capsule Take 20 mg by mouth every morning.   Yes [provider]  ibuprofen (ADVIL) 200 MG tablet Take 400 mg by mouth 3 (three) times daily as needed for headache (pain).   Yes [provider]  irbesartan (AVAPRO) 150 MG tablet Take 150 mg by mouth at bedtime.   Yes [provider]     Vital Signs: BP (!) 153/90 (BP Location: Left Arm)   Pulse 77   Temp 99.1 F (37.3 C) (Oral)   Resp 18   Ht '6\' 2"'$  (1.88 m)   Wt 210 lb (95.3 kg)   SpO2 97%   BMI 26.96 kg/m   Physical Exam Vitals reviewed.  Constitutional:      General: He is not in acute distress.    Appearance: He is ill-appearing.  HENT:     Head: Normocephalic and atraumatic.     Mouth/Throat:     Mouth: Mucous membranes are moist.     Pharynx: Oropharynx is clear.  Eyes:     Extraocular Movements: Extraocular movements intact.     Pupils: Pupils are equal, round, and reactive to light.  Pulmonary:     Effort: Pulmonary effort is normal. No respiratory distress.     Breath sounds: Normal breath sounds.  Abdominal:     Comments: RUQ drain unremarkable with sutures/statlock in place. ~100 cc murky, yellow OP in  gravity bag. Drain flushes/aspirates easily. Dressing C/D/I  Skin:    General: Skin is warm and dry.     Coloration: Skin is jaundiced.  Neurological:     Mental Status: He is alert and oriented to person, place, and time.  Psychiatric:        Mood and Affect: Mood normal.        Behavior: Behavior normal.        Thought Content: Thought content normal.        Judgment: Judgment normal.     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 Patient is being evaluated for percutaneous biliary drainage and percutaneous biopsy. EXAM: 1. Percutaneous transhepatic cholangiogram with ultrasound and fluoroscopic guidance 2. Placement of internal/external biliary drain with ultrasound and fluoroscopic guidance MEDICATIONS: Rocephin 2 g; The antibiotic was administered within an appropriate time frame prior to the initiation of the procedure. ANESTHESIA/SEDATION: Moderate (conscious) sedation was employed during this procedure. A total of Versed 6.0 mg and Fentanyl 250 mcg was administered intravenously by the radiology nurse. Total intra-service moderate Sedation Time: 54 minutes. The patient's level of consciousness and vital signs were monitored continuously by radiology nursing throughout the procedure under my direct supervision. FLUOROSCOPY: Radiation Exposure Index (  as provided by the fluoroscopic device): 637 mGy Kerma COMPLICATIONS: None immediate. PROCEDURE: Informed written consent was obtained from the patient after a thorough discussion of the procedural risks, benefits and alternatives. All questions were addressed. Maximal Sterile Barrier Technique was utilized including caps, mask, sterile gowns, sterile gloves, sterile drape, hand hygiene and skin antiseptic. A timeout was performed prior to the initiation of the procedure. Patient was placed supine on the interventional  table. The anterior and right side of the abdomen was prepped and draped in sterile fashion. Ultrasound was used to identify dilated ducts in the left hepatic lobe. The anterior abdomen was anesthetized with 1% lidocaine. Using ultrasound guidance, a 21 gauge needle was directed into a dilated peripheral duct and percutaneous transhepatic cholangiogram was performed. Multiple attempts were made to advance a wire through this access but this was not successful due to tortuosity of the bile duct. Therefore, attention was directed to the right hepatic lobe. A dilated peripheral right hepatic duct was identified with ultrasound and targeted. The right side of the abdomen was anesthetized with 1% lidocaine. Using ultrasound guidance, 21 gauge needle was directed into the dilated peripheral duct and contrast injection confirmed placement in the biliary system. A 0.018 wire was advanced into the central hepatic ducts and a transitional dilator set was successfully placed. Additional cholangiogram images were obtained. Eventually this access was upsized to a 5 [place] Kumpe catheter over a J wire. Initially, the common hepatic duct was not identified but eventually a small amount of contrast was noted within the common hepatic duct and this was successfully cannulated with a Roadrunner wire. The Kumpe catheter was eventually advanced into the duodenum using a Bentson wire. The tract was dilated and a 10 [place] biliary drain was advanced over the wire. The distal aspect of the drain was placed in the duodenum. Contrast injection confirmed appropriate placement in the biliary system. A sample of bile was collected for cytology. Drain was flushed with saline and attached to a gravity bag. Bandage was placed. FINDINGS: Bilateral intrahepatic biliary dilatation identified with ultrasound. The lesion seen on CT and MRI is inconspicuous with ultrasound. Left hepatic duct was initially punctured using ultrasound guidance and  cholangiogram demonstrated filling of dilated left hepatic ducts and at least 1 right hepatic duct. Second percutaneous transhepatic cholangiogram demonstrated filling of a dilated right hepatic duct and additional dilated intrahepatic ducts were identified. Eventually, the common hepatic duct and the common bile duct were identified. No significant dilatation of the extrahepatic biliary system. The area of obstruction is at the biliary confluence. A drainage catheter was successfully advanced to the duodenum. Final cholangiogram images demonstrate that majority of the intrahepatic bile ducts were opacified. There is a small segment of the right hepatic lobe which is not opacifying and likely obstructed from neoplasm and this finding was confirmed with ultrasound. IMPRESSION: 1. Successful percutaneous transhepatic cholangiogram and placement of an internal/external biliary drain via a right hepatic duct. Drain was successfully advanced into the duodenum. The obstruction is at the biliary confluence. 2. Hepatic lesion seen on recent CT and MRI is poorly visualized with ultrasound. Small segment of the right hepatic lobe appears to have persistent biliary dilatation after drain placement and suspect that the mass lesion is in this area. 3. Fluid was sent for cytology. Depending on the results of the cytology, we may need to re-assess for image guided percutaneous biopsy of the liver lesion versus brush biopsy at the biliary confluence. Electronically Signed   By: Quita Skye  Anselm Pancoast M.D.   On: 08/16/2021 17:30   MR ABDOMEN MRCP W WO CONTAST  Result Date: 08/14/2021 CLINICAL DATA:  Jaundice EXAM: MRI ABDOMEN WITHOUT AND WITH CONTRAST (INCLUDING MRCP) TECHNIQUE: Multiplanar multisequence MR imaging of the abdomen was performed both before and after the administration of intravenous contrast. Heavily T2-weighted images of the biliary and pancreatic ducts were obtained, and three-dimensional MRCP images were rendered by post  processing. CONTRAST:  36m GADAVIST GADOBUTROL 1 MMOL/ML IV SOLN COMPARISON:  CT abdomen and pelvis dated August 13, 2021 FINDINGS: Lower chest: No acute findings. Hepatobiliary: Irregular mass of segment 8 liver measuring 2.9 x 2.8 cm on series 3, image 14 with intermediate T2 signal, low T1 signal and perilesional enhancement that gradually fills in on more delayed imaging. Diffuse mild intrahepatic biliary ductal dilation with more focal intrahepatic ductal dilation seen distal to the mass. Normal caliber common bile duct with wall thickening. No choledocholithiasis. Gallbladder is decompressed. Pancreas: No mass, inflammatory changes, or other parenchymal abnormality identified. Spleen:  Within normal limits in size and appearance. Adrenals/Urinary Tract: Bilateral adrenal glands are unremarkable. 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 Stomach/Bowel: Visualized portions within the abdomen are unremarkable. Vascular/Lymphatic: No pathologically enlarged lymph nodes identified. No abdominal aortic aneurysm demonstrated. Other:  None. Musculoskeletal: No suspicious bone lesions identified. IMPRESSION: 1. Irregular mass of segment 8 of the liver with perilesional enhancement that gradually fills in on more delayed imaging, concerning for cholangiocarcinoma. 2. Diffuse mild intrahepatic biliary ductal dilation with wall thickening of the common bile duct. Findings are concerning for tumor extension along the common bile duct. 3. Gallbladder is thick-walled and decompressed, concerning for additional site of tumor extension. Electronically Signed   By: LYetta GlassmanM.D.   On: 08/14/2021 12:22   MR 3D Recon At Scanner  Result Date: 08/14/2021 CLINICAL DATA:  Jaundice EXAM: MRI ABDOMEN WITHOUT AND WITH CONTRAST (INCLUDING MRCP) TECHNIQUE: Multiplanar multisequence MR imaging of the abdomen was performed both before and after the administration of intravenous contrast. Heavily  T2-weighted images of the biliary and pancreatic ducts were obtained, and three-dimensional MRCP images were rendered by post processing. CONTRAST:  971mGADAVIST GADOBUTROL 1 MMOL/ML IV SOLN COMPARISON:  CT abdomen and pelvis dated August 13, 2021 FINDINGS: Lower chest: No acute findings. Hepatobiliary: Irregular mass of segment 8 liver measuring 2.9 x 2.8 cm on series 3, image 14 with intermediate T2 signal, low T1 signal and perilesional enhancement that gradually fills in on more delayed imaging. Diffuse mild intrahepatic biliary ductal dilation with more focal intrahepatic ductal dilation seen distal to the mass. Normal caliber common bile duct with wall thickening. No choledocholithiasis. Gallbladder is decompressed. Pancreas: No mass, inflammatory changes, or other parenchymal abnormality identified. Spleen:  Within normal limits in size and appearance. Adrenals/Urinary Tract: Bilateral adrenal glands are unremarkable. 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 Stomach/Bowel: Visualized portions within the abdomen are unremarkable. Vascular/Lymphatic: No pathologically enlarged lymph nodes identified. No abdominal aortic aneurysm demonstrated. Other:  None. Musculoskeletal: No suspicious bone lesions identified. IMPRESSION: 1. Irregular mass of segment 8 of the liver with perilesional enhancement that gradually fills in on more delayed imaging, concerning for cholangiocarcinoma. 2. Diffuse mild intrahepatic biliary ductal dilation with wall thickening of the common bile duct. Findings are concerning for tumor extension along the common bile duct. 3. Gallbladder is thick-walled and decompressed, concerning for additional site of tumor extension. Electronically Signed   By: LeYetta Glassman.D.   On: 08/14/2021 12:22  CT ABDOMEN PELVIS W CONTRAST  Result Date: 08/13/2021 CLINICAL DATA:  Abdominal pain and jaundice. EXAM: CT ABDOMEN AND PELVIS WITH CONTRAST TECHNIQUE: Multidetector CT  imaging of the abdomen and pelvis was performed using the standard protocol following bolus administration of intravenous contrast. RADIATION DOSE REDUCTION: This exam was performed according to the departmental dose-optimization program which includes automated exposure control, adjustment of the mA and/or kV according to patient size and/or use of iterative reconstruction technique. CONTRAST:  131m OMNIPAQUE IOHEXOL 300 MG/ML  SOLN COMPARISON:  None Available. FINDINGS: Lower chest: No acute abnormality. The cardiac size is normal. Small hiatal hernia. Hepatobiliary: Liver is 18 cm length slightly steatotic. In hepatic segment 5 near the liver hilum there is a heterogeneous masslike area of hypoenhancement measuring 2.9 x 2.7 cm on series 2 axial image 19 and 2.7 cm height on coronal reconstruction image 67. There is encasement and apparent complete effacement of the anterior division of the right portal vein associated with this, intrahepatic biliary dilatation above this in segment 8, and intrahepatic biliary dilatation in both segments of the left lobe. The hepatic ductal confluence is just below this and is not well seen but is suspected to be obstructed for the most part. There is thickening of the wall in the common hepatic duct and probably also in the cystic duct with gallbladder mostly contracted but potentially thickened and with pericholecystic fluid also seen. No calcified stone is evident. The common bile duct is normal caliber below this level and it is unremarkable and not thickened. Primary bile duct neoplasm and primary hepatic neoplasm with secondary biliary obstruction are the most likely possibilities. An inflammatory/infectious process with phlegmon causing a masslike abnormality is possible but this should be considered a neoplasm until proven otherwise. No other focal liver abnormality is seen. The main portal vein and other portal venous branches opacify well with prominent main portal  vein measuring 15.4 mm. Pancreas: No focal abnormality. No ductal dilatation or inflammatory change. Spleen: Mildly prominent, 13.1 cm AP, no mass enhancement. Adrenals/Urinary Tract: There is no adrenal mass. 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 There is no renal mass. No urinary stone or obstruction is evident. There is mild general thickening of the bladder without inflammatory change. Stomach/Bowel: Small hiatal hernia. No bowel dilatation or wall thickening including the appendix. Moderate fecal stasis particularly in the ascending colon is seen, scattered diverticula left-sided colon without diverticulitis. Vascular/Lymphatic: There is mild aortic atherosclerosis without AAA. As above the anterior division of the right portal vein does not opacify and suspected encased and effaced. There is a normal IVC and deep pelvic veins, normal SMV and splenic vein enhancement. There are scattered periportal and portacaval retroperitoneal lymph nodes up to 7 mm in short axis but no area lymph nodes which exceed pathologic size criteria. There is no gastrohepatic ligament or other adenopathy , no mesenteric or pelvic mass is seen. Reproductive: Enlarged prostate, 5.2 cm transverse with bladder base impression. Multiple phleboliths both pelvic sidewalls. Other: Small umbilical and inguinal fat hernias. No incarcerated hernia. No free air, hemorrhage or ascites. Musculoskeletal: Advanced degenerative disc disease and grade 1 retrolisthesis and spondylosis L5-S1 with acquired spinal stenosis, acquired moderate foraminal stenosis. Schmorl's nodes lower thoracic spine. No destructive bone lesion is seen. IMPRESSION: 1. 2.9 x 2.7 x 2.7 cm hypoenhancing mass in segment 5 near the liver hilum just above the hepatic ductal confluence, suspected either primary bile duct malignancy or primary hepatic malignancy with secondary biliary obstruction. 2. The  common hepatic duct is thickened and there is  intrahepatic biliary dilatation throughout the left lobe and above the mass in segment 8. 3. Possible an inflammatory/infectious process could produce these findings but this should be considered neoplasm until proven otherwise. 4. There appears to be additional thickening of the cystic duct and there is contraction and possible thickening of the gallbladder with pericholecystic fluid. Coexisting cholecystitis is not excluded. No calcified stone or common bile duct dilatation is seen. 5. No appreciable opacification in the anterior branch of the right portal vein which is suspected encased and effaced by the process. There is mild prominence of the main portal vein with remainder of the portal vein branches opacifying well. 6. Subcentimeter periportal and portacaval space lymph nodes up to 7 mm in short axis but no lymph nodes exceeding the pathologic size criteria. 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 8. Slight splenomegaly, slight hepatic steatosis. 9. Constipation and diverticulosis. 10. Enlarged prostate impressing on the posterior bladder with mild bladder thickening most likely due to hypertrophy. 11. Umbilical and inguinal fat hernias. Additional findings described above. Electronically Signed   By: Telford Nab M.D.   On: 08/13/2021 20:41    Labs:  CBC: Recent Labs    08/14/21 0749 08/15/21 0535 08/16/21 0437 08/17/21 0510  WBC 6.1 4.7 5.7 7.6  HGB 13.8 12.6* 12.9* 12.0*  HCT 40.0 37.7* 37.8* 34.8*  PLT 264 233 253 246    COAGS: Recent Labs    08/13/21 1945 08/16/21 0437  INR 0.9 0.8    BMP: Recent Labs    08/14/21 0749 08/15/21 0535 08/16/21 0437 08/17/21 0510  NA 137 138 140 139  K 3.9 4.0 3.7 3.6  CL 106 108 108 107  CO2 '22 22 23 24  '$ GLUCOSE 101* 108* 107* 126*  BUN '17 18 15 11  '$ CALCIUM 9.5 8.9 9.2 9.1  CREATININE 0.73 0.91 0.76 0.83  GFRNONAA >60 >60 >60 >60    LIVER FUNCTION TESTS: Recent Labs    08/14/21 0749  08/15/21 0535 08/16/21 0437 08/17/21 0510  BILITOT 11.6* 10.5* 11.5* 10.3*  AST 117* 108* 112* 90*  ALT 257* 210* 212* 184*  ALKPHOS 319* 279* 271* 274*  PROT 7.6 6.5 6.7 6.4*  ALBUMIN 4.4 3.6 3.7 3.4*    Assessment and Plan:  S/p percutaneous transhepatic cholangiogram and placement of an internal/external biliary drain via a right hepatic duct with Dr. Anselm Pancoast 08/16/21.   Pt resting in bed with wife at bedside.  He endorses intermittent abdominal pain. Denies N/V, fevers.  He is in no distress.  RUQ drain unremarkable with sutures/statlock in place. ~100 cc murky, yellow OP in gravity bag. Drain flushes/aspirates easily. Dressing C/D/I. 850 cc documented over last 24 hours.    Continue documenting OP in Epic q shift.  Continue flushing TID.  Change dressing q shift or as needed.  Please call IR if difficulty flushing or sudden change in output.    IR to follow. Please call with questions/concerns.    Electronically Signed: Tyson Alias, NP 08/17/2021, 1:17 PM   I spent a total of 15 Minutes at the the patient's bedside AND on the patient's hospital floor or unit, greater than 50% of which was counseling/coordinating care for biliary drain.

## 2021-08-17 NOTE — Progress Notes (Signed)
Insurance case manager called, Jeffrey Blevins if any questions are needing to be answered. Case manager phone number: [phone] ext [phone].

## 2021-08-17 NOTE — Progress Notes (Signed)
Subjective: Some tenderness at the biliary drain site.  Objective: Vital signs in last 24 hours: Temp:  [98.9 F (37.2 C)-99.2 F (37.3 C)] 98.9 F (37.2 C) (06/20 1316) Pulse Rate:  [69-77] 77 (06/20 1316) Resp:  [17-18] 18 (06/20 1316) BP: (153-170)/(90-93) 153/90 (06/20 1316) SpO2:  [96 %-97 %] 97 % (06/20 1316) Last BM Date : 08/16/21  Intake/Output from previous day: 06/19 0701 - 06/20 0700 In: 2722.1 [P.O.:360; I.V.:1987.1] Out: 2500 [Urine:1650; Drains:850] Intake/Output this shift: Total I/O In: 999.7 [P.O.:240; I.V.:759.7] Out: 880 [Urine:450; Drains:430]  General appearance: alert and no distress GI: intact biliary drain  Lab Results: Recent Labs    08/15/21 0535 08/16/21 0437 08/17/21 0510  WBC 4.7 5.7 7.6  HGB 12.6* 12.9* 12.0*  HCT 37.7* 37.8* 34.8*  PLT 233 253 246   BMET Recent Labs    08/15/21 0535 08/16/21 0437 08/17/21 0510  NA 138 140 139  K 4.0 3.7 3.6  CL 108 108 107  CO2 '22 23 24  '$ GLUCOSE 108* 107* 126*  BUN '18 15 11  '$ CREATININE 0.91 0.76 0.83  CALCIUM 8.9 9.2 9.1   LFT Recent Labs    08/17/21 0510  PROT 6.4*  ALBUMIN 3.4*  AST 90*  ALT 184*  ALKPHOS 274*  BILITOT 10.3*   PT/INR Recent Labs    08/16/21 0437  LABPROT 11.2*  INR 0.8   Hepatitis Panel Recent Labs    08/16/21 0437  HEPBSAG NON REACTIVE  HCVAB NON REACTIVE  HEPAIGM NON REACTIVE  HEPBIGM NON REACTIVE   C-Diff No results for input(s): "CDIFFTOX" in the last 72 hours. Fecal Lactopherrin No results for input(s): "FECLLACTOFRN" in the last 72 hours.  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 Patient is being evaluated for percutaneous biliary drainage and percutaneous biopsy. EXAM: 1. Percutaneous transhepatic cholangiogram with ultrasound and fluoroscopic guidance 2. Placement of internal/external biliary drain with  ultrasound and fluoroscopic guidance MEDICATIONS: Rocephin 2 g; The antibiotic was administered within an appropriate time frame prior to the initiation of the procedure. ANESTHESIA/SEDATION: Moderate (conscious) sedation was employed during this procedure. A total of Versed 6.0 mg and Fentanyl 250 mcg was administered intravenously by the radiology nurse. Total intra-service moderate Sedation Time: 54 minutes. The patient's level of consciousness and vital signs were monitored continuously by radiology nursing throughout the procedure under my direct supervision. FLUOROSCOPY: Radiation Exposure Index (as provided by the fluoroscopic device): 245 mGy Kerma COMPLICATIONS: None immediate. PROCEDURE: Informed written consent was obtained from the patient after a thorough discussion of the procedural risks, benefits and alternatives. All questions were addressed. Maximal Sterile Barrier Technique was utilized including caps, mask, sterile gowns, sterile gloves, sterile drape, hand hygiene and skin antiseptic. A timeout was performed prior to the initiation of the procedure. Patient was placed supine on the interventional table. The anterior and right side of the abdomen was prepped and draped in sterile fashion. Ultrasound was used to identify dilated ducts in the left hepatic lobe. The anterior abdomen was anesthetized with 1% lidocaine. Using ultrasound guidance, a 21 gauge needle was directed into a dilated peripheral duct and percutaneous transhepatic cholangiogram was performed. Multiple attempts were made to advance a wire through this access but this was not successful due to tortuosity of the bile duct. Therefore, attention was directed to the right hepatic lobe. A dilated peripheral right hepatic duct was identified with ultrasound and targeted. The right side  of the abdomen was anesthetized with 1% lidocaine. Using ultrasound guidance, 21 gauge needle was directed into the dilated peripheral duct and contrast  injection confirmed placement in the biliary system. A 0.018 wire was advanced into the central hepatic ducts and a transitional dilator set was successfully placed. Additional cholangiogram images were obtained. Eventually this access was upsized to a 5 [place] Kumpe catheter over a J wire. Initially, the common hepatic duct was not identified but eventually a small amount of contrast was noted within the common hepatic duct and this was successfully cannulated with a Roadrunner wire. The Kumpe catheter was eventually advanced into the duodenum using a Bentson wire. The tract was dilated and a 10 [place] biliary drain was advanced over the wire. The distal aspect of the drain was placed in the duodenum. Contrast injection confirmed appropriate placement in the biliary system. A sample of bile was collected for cytology. Drain was flushed with saline and attached to a gravity bag. Bandage was placed. FINDINGS: Bilateral intrahepatic biliary dilatation identified with ultrasound. The lesion seen on CT and MRI is inconspicuous with ultrasound. Left hepatic duct was initially punctured using ultrasound guidance and cholangiogram demonstrated filling of dilated left hepatic ducts and at least 1 right hepatic duct. Second percutaneous transhepatic cholangiogram demonstrated filling of a dilated right hepatic duct and additional dilated intrahepatic ducts were identified. Eventually, the common hepatic duct and the common bile duct were identified. No significant dilatation of the extrahepatic biliary system. The area of obstruction is at the biliary confluence. A drainage catheter was successfully advanced to the duodenum. Final cholangiogram images demonstrate that majority of the intrahepatic bile ducts were opacified. There is a small segment of the right hepatic lobe which is not opacifying and likely obstructed from neoplasm and this finding was confirmed with ultrasound. IMPRESSION: 1. Successful percutaneous  transhepatic cholangiogram and placement of an internal/external biliary drain via a right hepatic duct. Drain was successfully advanced into the duodenum. The obstruction is at the biliary confluence. 2. Hepatic lesion seen on recent CT and MRI is poorly visualized with ultrasound. Small segment of the right hepatic lobe appears to have persistent biliary dilatation after drain placement and suspect that the mass lesion is in this area. 3. Fluid was sent for cytology. Depending on the results of the cytology, we may need to re-assess for image guided percutaneous biopsy of the liver lesion versus brush biopsy at the biliary confluence. Electronically Signed   By: Markus Daft M.D.   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 2) Obstructive jaundice.   IR biopsy yesterday was not possible as the mass was not evidence on the ultrasound compared with the CT scan and MRI.  The fluid cytology is pending.  If the cytology is nondiagnostic, per IR a reattempt with biopsy will be obtained versus brushings of the bile ducts.  If these measures fail, an ERCP with cholangioscopy and brushings can be attempted.  Plan: 1) Await cytology results.  ADDENDUM: The patient states that IR will attempt a biopsy tomorrow.  I will follow along through Epic and perform any necessary procedures pending the biopsy attempt.  LOS: 3 days   Joscelyn Hardrick D 08/17/2021,  3:14 PM

## 2021-08-17 NOTE — H&P (Signed)
HPI:  The patient has had a H&P performed within the last 30 days, all history, medications, and exam have been reviewed. The patient denies any interval changes since the H&P.  Pt referred for liver mass biopsy by Dr. Carlean Purl and [facility] [place]  Medications: Prior to Admission medications   Medication Sig Start Date End Date Taking? Authorizing Provider  acetaminophen (TYLENOL) 500 MG tablet Take 1,000 mg by mouth 3 (three) times daily as needed for headache (pain).   Yes [provider]  amLODipine (NORVASC) 10 MG tablet Take 10 mg by mouth every morning.   Yes [provider]  esomeprazole (NEXIUM) 20 MG capsule Take 20 mg by mouth every morning.   Yes [provider]  ibuprofen (ADVIL) 200 MG tablet Take 400 mg by mouth 3 (three) times daily as needed for headache (pain).   Yes [provider]  irbesartan (AVAPRO) 150 MG tablet Take 150 mg by mouth at bedtime.   Yes [provider]     Vital Signs: BP (!) 156/93 (BP Location: Left Arm)   Pulse 71   Temp 99.1 F (37.3 C) (Oral)   Resp 17   Ht '6\' 2"'$  (1.88 m)   Wt 210 lb (95.3 kg)   SpO2 97%   BMI 26.96 kg/m   Physical Exam Vitals reviewed.  Constitutional:      General: He is not in acute distress.    Appearance: He is ill-appearing.  HENT:     Head: Normocephalic and atraumatic.     Mouth/Throat:     Mouth: Mucous membranes are moist.     Pharynx: Oropharynx is clear.  Eyes:     General: No scleral icterus.    Extraocular Movements: Extraocular movements intact.     Pupils: Pupils are equal, round, and reactive to light.  Cardiovascular:     Rate and Rhythm: Normal rate and regular rhythm.     Pulses: Normal pulses.     Heart sounds: Normal heart sounds.  Pulmonary:     Effort: Pulmonary effort is normal. No respiratory distress.     Breath sounds: Normal breath sounds.  Abdominal:     General: Bowel sounds are normal. There is no distension.     Palpations: Abdomen is soft.      Tenderness: There is no abdominal tenderness. There is no guarding.     Comments: RUQ drain in place  Skin:    General: Skin is warm and dry.     Coloration: Skin is jaundiced.  Neurological:     Mental Status: He is alert and oriented to person, place, and time.  Psychiatric:        Mood and Affect: Mood normal.        Behavior: Behavior normal.        Thought Content: Thought content normal.        Judgment: Judgment normal.     Mallampati Score:  MD Evaluation Airway: WNL Heart: WNL Abdomen: WNL Chest/ Lungs: WNL ASA  Classification: 3 Mallampati/Airway Score: Two  Labs:  CBC: Recent Labs    08/14/21 0749 08/15/21 0535 08/16/21 0437 08/17/21 0510  WBC 6.1 4.7 5.7 7.6  HGB 13.8 12.6* 12.9* 12.0*  HCT 40.0 37.7* 37.8* 34.8*  PLT 264 233 253 246    COAGS: Recent Labs    08/13/21 1945 08/16/21 0437  INR 0.9 0.8    BMP: Recent Labs    08/14/21 0749 08/15/21 0535 08/16/21 0437 08/17/21 0510  NA 137 138 140 139  K 3.9 4.0 3.7 3.6  CL 106 108 108 107  CO2 '22 22 23 24  '$ GLUCOSE 101* 108* 107* 126*  BUN '17 18 15 11  '$ CALCIUM 9.5 8.9 9.2 9.1  CREATININE 0.73 0.91 0.76 0.83  GFRNONAA >60 >60 >60 >60    LIVER FUNCTION TESTS: Recent Labs    08/14/21 0749 08/15/21 0535 08/16/21 0437 08/17/21 0510  BILITOT 11.6* 10.5* 11.5* 10.3*  AST 117* 108* 112* 90*  ALT 257* 210* 212* 184*  ALKPHOS 319* 279* 271* 274*  PROT 7.6 6.5 6.7 6.4*  ALBUMIN 4.4 3.6 3.7 3.4*    Assessment/Plan:   Pt resting in bed with wife at bedside.  Pt is A&O, calm and pleasant.  He is in no distress.  He denies N/V. Endorses some pain intermittent abdominal pain relieved with IV pain medication. Pt understands that he will be NPO at [facility] for liver biopsy tomorrow.  Pt denies the use of AC/AP.  Procedure tentatively scheduled for 08/18/21.  Risks and benefits of liver mass biopsy with  moderate sedation was discussed with the patient and/or patient's family including, but  not limited to bleeding, infection, damage to adjacent structures or low yield requiring additional tests.  All of the questions were answered and there is agreement to proceed.  Consent signed and in chart.   Signed: Tyson Alias 08/17/2021, 1:15 PM

## 2021-08-17 NOTE — Progress Notes (Signed)
PROGRESS NOTE    Jeffrey Blevins  ZTI:[id number] DOB: [birth year] DOA: 08/13/2021 PCP: Percell Belt, DO   Brief Narrative: 56 year old with past medical history significant for GERD, Barrett's esophagus, history of esophageal stricture status post dilation, hypertension, Raynaud's phenomena, right hydrocele with history of a spermatocelectomy presents with Jaundice.  He noticed dark urine since 5 days prior to admission.  He is started to drink more water.  Subsequently he noticed his stool was very light in color and his skin became yellow.  He presented for further evaluation.  He was found to have normal lipase, sodium 131, creatinine 1.2, AST: 115, ALT 268, alkaline phosphatase 283, bilirubin 9.8.   - CT abdomen and pelvis showed 2.9 x 2.7 x 2.7 cm hypoenhancing mass in the segment 5 near the liver hilum just above the hepatic ductal confluence, suspected either primary bile duct malignancy or primary hepatic malignancy with secondary biliary obstruction.  The common hepatic duct is thickened and there is intrahepatic biliary dilation throughout the left lobe and above the mass in segment 8. There appears to be additional thickening of the cystic duct and there is contraction and possible thickening of the gallbladder with pericholecystic fluid. Coexisting cholecystitis is not excluded. No calcified stone or common bile duct dilatation is seen.  Patient admitted with obstructive Jaundice, secondary to obstructive liver mass.  GI and IR consulted.  Patient Underwent PTC biliary drain placement by  IR.  Cytology sent from biliary drain fluid pending.   Plan to continue with IV fluids to prevent dehydration due to  output from biliary drain.  IR planning on CT guide biopsy of liver mass on 6/21.   Assessment & Plan:   Principal Problem:   Biliary obstruction Active Problems:   Tobacco use   Hypertension   Hyponatremia   Prostate enlargement   Hepatic steatosis   GERD (gastroesophageal  reflux disease)  1-Obstructive Jaundice: Transaminases, Hyperbilirubinemia:  Secondary to obstructive liver mass.  -CT abdomen Pelvis: 2.9 x 2.7 x 2.7 cm hypoenhancing mass in the segment 5 near the liver hilum just above the hepatic ductal confluence,  The common hepatic duct is thickened and there is intrahepatic biliary dilation throughout the left lobe and above the mass in segment 8. -MRCP: Irregular mass of segment 8 of the liver with perilesional enhancement that gradually fills in on more delayed imaging, concerning for cholangiocarcinoma. Diffuse mild intrahepatic biliary ductal dilation with wall thickening of the common bile duct. Findings are concerning for tumor extension along the common bile duct. Gallbladder is thick-walled and decompressed, concerning for additional site of tumor extension. -IR consulted for evaluation and treatment.  -GI following, Dr Benson [place] primary.  - Alfa fetoprotein: 6.4, Viral Hepatitis  panel negative,  Ca 19:  76 -6/20: Underwent PTC biliary drain placement by IR.  Cytology  from biliary drain fluid: Atypical cells background of reactive ductal cell. Non diagnostic.  -Continue with  low dose IV dilaudid, Oxycodone. For pain management.  -Will need to continue IV fluids, prevent dehydration.  -850 cc from biliary drain yesterday. Bilirubin down to 10 from 11.  -Plan for CT Guide liver Biopsy 6/21.  GERD:  -Continue with PPI>   Tobacco Use:  Counseling.   HTN:  DC Metoprolol, bradycardia.  Resume norvasc.  Hold Avapro due to hyponatremia and avoid dehydration in setting of biliary drain placement.   Hyponatremia:  Hypovolemia;  On IV fluids.  Resolved.   Prostate Enlargement.  Started Tamsulosin.  Needs to follow up with  urology out patient.   Anxiety; Continue with  low dose xanax.     Estimated body mass index is 26.96 kg/m as calculated from the following:   Height as of this encounter: 6' 2"  (1.88 m).   Weight as of this encounter:  95.3 kg.   DVT prophylaxis: SCD, he will need Bx.  Code Status: Full code Family Communication: family at bedside.  Disposition Plan:  Status is: Inpatient Remains inpatient appropriate because: obstructive Jaundice.     Consultants:  GI  Procedures:  MRCP  Antimicrobials:  None  Subjective: He report abdominal pain has improved, controlled with pain meds.  No BM.    Objective: Vitals:   08/16/21 1237 08/16/21 2048 08/17/21 0516 08/17/21 1316  BP: (!) 162/92 (!) 170/92 (!) 156/93 (!) 153/90  Pulse: 66 69 71 77  Resp: 18 17  18   Temp: 98.9 F (37.2 C) 99.2 F (37.3 C) 99.1 F (37.3 C) 98.9 F (37.2 C)  TempSrc: Oral Oral Oral Oral  SpO2: 100% 96% 97% 97%  Weight:      Height:        Intake/Output Summary (Last 24 hours) at 08/17/2021 1618 Last data filed at 08/17/2021 1500 Gross per 24 hour  Intake 3181.78 ml  Output 3230 ml  Net -48.22 ml    Filed Weights   08/13/21 1604  Weight: 95.3 kg    Examination:  General exam: NAD, Icteric.  Respiratory system: CTA Cardiovascular system: S 1, S 2 RRR Gastrointestinal system: BS present, soft, nt Central nervous system: Non focal.  Extremities: No edema   Data Reviewed: I have personally reviewed following labs and imaging studies  CBC: Recent Labs  Lab 08/13/21 1618 08/14/21 0749 08/15/21 0535 08/16/21 0437 08/17/21 0510  WBC 5.1 6.1 4.7 5.7 7.6  HGB 13.1 13.8 12.6* 12.9* 12.0*  HCT 38.4* 40.0 37.7* 37.8* 34.8*  MCV 90.4 90.9 92.6 92.2 91.8  PLT 220 264 233 253 004    Basic Metabolic Panel: Recent Labs  Lab 08/13/21 1618 08/14/21 0749 08/15/21 0535 08/16/21 0437 08/17/21 0510  NA 131* 137 138 140 139  K 3.8 3.9 4.0 3.7 3.6  CL 98 106 108 108 107  CO2 20* 22 22 23 24   GLUCOSE 102* 101* 108* 107* 126*  BUN 17 17 18 15 11   CREATININE 1.29* 0.73 0.91 0.76 0.83  CALCIUM 10.2 9.5 8.9 9.2 9.1  MG  --  2.2  --   --   --   PHOS  --  3.9  --   --   --     GFR: Estimated Creatinine  Clearance: 115.5 mL/min (by C-G formula based on SCr of 0.83 mg/dL). Liver Function Tests: Recent Labs  Lab 08/13/21 1945 08/14/21 0749 08/15/21 0535 08/16/21 0437 08/17/21 0510  AST 104* 117* 108* 112* 90*  ALT 246* 257* 210* 212* 184*  ALKPHOS 263* 319* 279* 271* 274*  BILITOT 9.6* 11.6* 10.5* 11.5* 10.3*  PROT 7.2 7.6 6.5 6.7 6.4*  ALBUMIN 4.4 4.4 3.6 3.7 3.4*    Recent Labs  Lab 08/13/21 1618  LIPASE 44    No results for input(s): "AMMONIA" in the last 168 hours. Coagulation Profile: Recent Labs  Lab 08/13/21 1945 08/16/21 0437  INR 0.9 0.8    Cardiac Enzymes: No results for input(s): "CKTOTAL", "CKMB", "CKMBINDEX", "TROPONINI" in the last 168 hours. BNP (last 3 results) No results for input(s): "PROBNP" in the last 8760 hours. HbA1C: No results for input(s): "HGBA1C" in the last 72  hours. CBG: No results for input(s): "GLUCAP" in the last 168 hours. Lipid Profile: No results for input(s): "CHOL", "HDL", "LDLCALC", "TRIG", "CHOLHDL", "LDLDIRECT" in the last 72 hours. Thyroid Function Tests: No results for input(s): "TSH", "T4TOTAL", "FREET4", "T3FREE", "THYROIDAB" in the last 72 hours. Anemia Panel: No results for input(s): "VITAMINB12", "FOLATE", "FERRITIN", "TIBC", "IRON", "RETICCTPCT" in the last 72 hours. Sepsis Labs: No results for input(s): "PROCALCITON", "LATICACIDVEN" in the last 168 hours.  No results found for this or any previous visit (from the past 240 hour(s)).       Radiology Studies: IR INT EXT BILIARY DRAIN WITH CHOLANGIOGRAM  Result Date: 08/16/2021 INDICATION: 56 year old with obstructive jaundice likely secondary to malignant neoplasm. Patient is being evaluated for percutaneous biliary drainage and percutaneous biopsy. EXAM: 1. Percutaneous transhepatic cholangiogram with ultrasound and fluoroscopic guidance 2. Placement of internal/external biliary drain with ultrasound and fluoroscopic guidance MEDICATIONS: Rocephin 2 g; The  antibiotic was administered within an appropriate time frame prior to the initiation of the procedure. ANESTHESIA/SEDATION: Moderate (conscious) sedation was employed during this procedure. A total of Versed 6.0 mg and Fentanyl 250 mcg was administered intravenously by the radiology nurse. Total intra-service moderate Sedation Time: 54 minutes. The patient's level of consciousness and vital signs were monitored continuously by radiology nursing throughout the procedure under my direct supervision. FLUOROSCOPY: Radiation Exposure Index (as provided by the fluoroscopic device): 629 mGy Kerma COMPLICATIONS: None immediate. PROCEDURE: Informed written consent was obtained from the patient after a thorough discussion of the procedural risks, benefits and alternatives. All questions were addressed. Maximal Sterile Barrier Technique was utilized including caps, mask, sterile gowns, sterile gloves, sterile drape, hand hygiene and skin antiseptic. A timeout was performed prior to the initiation of the procedure. Patient was placed supine on the interventional table. The anterior and right side of the abdomen was prepped and draped in sterile fashion. Ultrasound was used to identify dilated ducts in the left hepatic lobe. The anterior abdomen was anesthetized with 1% lidocaine. Using ultrasound guidance, a 21 gauge needle was directed into a dilated peripheral duct and percutaneous transhepatic cholangiogram was performed. Multiple attempts were made to advance a wire through this access but this was not successful due to tortuosity of the bile duct. Therefore, attention was directed to the right hepatic lobe. A dilated peripheral right hepatic duct was identified with ultrasound and targeted. The right side of the abdomen was anesthetized with 1% lidocaine. Using ultrasound guidance, 21 gauge needle was directed into the dilated peripheral duct and contrast injection confirmed placement in the biliary system. A 0.018 wire  was advanced into the central hepatic ducts and a transitional dilator set was successfully placed. Additional cholangiogram images were obtained. Eventually this access was upsized to a 5 [place] Kumpe catheter over a J wire. Initially, the common hepatic duct was not identified but eventually a small amount of contrast was noted within the common hepatic duct and this was successfully cannulated with a Roadrunner wire. The Kumpe catheter was eventually advanced into the duodenum using a Bentson wire. The tract was dilated and a 10 [place] biliary drain was advanced over the wire. The distal aspect of the drain was placed in the duodenum. Contrast injection confirmed appropriate placement in the biliary system. A sample of bile was collected for cytology. Drain was flushed with saline and attached to a gravity bag. Bandage was placed. FINDINGS: Bilateral intrahepatic biliary dilatation identified with ultrasound. The lesion seen on CT and MRI is inconspicuous with ultrasound. Left hepatic duct  was initially punctured using ultrasound guidance and cholangiogram demonstrated filling of dilated left hepatic ducts and at least 1 right hepatic duct. Second percutaneous transhepatic cholangiogram demonstrated filling of a dilated right hepatic duct and additional dilated intrahepatic ducts were identified. Eventually, the common hepatic duct and the common bile duct were identified. No significant dilatation of the extrahepatic biliary system. The area of obstruction is at the biliary confluence. A drainage catheter was successfully advanced to the duodenum. Final cholangiogram images demonstrate that majority of the intrahepatic bile ducts were opacified. There is a small segment of the right hepatic lobe which is not opacifying and likely obstructed from neoplasm and this finding was confirmed with ultrasound. IMPRESSION: 1. Successful percutaneous transhepatic cholangiogram and placement of an internal/external biliary  drain via a right hepatic duct. Drain was successfully advanced into the duodenum. The obstruction is at the biliary confluence. 2. Hepatic lesion seen on recent CT and MRI is poorly visualized with ultrasound. Small segment of the right hepatic lobe appears to have persistent biliary dilatation after drain placement and suspect that the mass lesion is in this area. 3. Fluid was sent for cytology. Depending on the results of the cytology, we may need to re-assess for image guided percutaneous biopsy of the liver lesion versus brush biopsy at the biliary confluence. Electronically Signed   By: Markus Daft M.D.   On: 08/16/2021 17:30        Scheduled Meds:  amLODipine  10 mg Oral q morning   pantoprazole (PROTONIX) IV  40 mg Intravenous Q24H   polyethylene glycol  17 g Oral Daily   senna  1 tablet Oral Daily   Continuous Infusions:  sodium chloride 100 mL/hr at 08/17/21 0226     LOS: 3 days    Time spent: 35 minutes.     Elmarie Shiley, MD [facility]   If 7PM-7AM, please contact night-coverage [email or web address]  08/17/2021, 4:18 PM

## 2021-08-18 ENCOUNTER — Inpatient Hospital Stay (HOSPITAL_COMMUNITY): Payer: Managed Care, Other (non HMO)

## 2021-08-18 DIAGNOSIS — K831 Obstruction of bile duct: Secondary | ICD-10-CM | POA: Diagnosis not present

## 2021-08-18 LAB — CBC
HCT: 35.1 % — ABNORMAL LOW (ref 39.0–52.0)
Hemoglobin: 11.5 g/dL — ABNORMAL LOW (ref 13.0–17.0)
MCH: 31.1 pg (ref 26.0–34.0)
MCHC: 32.8 g/dL (ref 30.0–36.0)
MCV: 94.9 fL (ref 80.0–100.0)
Platelets: 249 10*3/uL (ref 150–400)
RBC: 3.7 MIL/uL — ABNORMAL LOW (ref 4.22–5.81)
RDW: 13.6 % (ref 11.5–15.5)
WBC: 8 10*3/uL (ref 4.0–10.5)
nRBC: 0 % (ref 0.0–0.2)

## 2021-08-18 LAB — COMPREHENSIVE METABOLIC PANEL
ALT: 152 U/L — ABNORMAL HIGH (ref 0–44)
AST: 69 U/L — ABNORMAL HIGH (ref 15–41)
Albumin: 3.3 g/dL — ABNORMAL LOW (ref 3.5–5.0)
Alkaline Phosphatase: 258 U/L — ABNORMAL HIGH (ref 38–126)
Anion gap: 8 (ref 5–15)
BUN: 17 mg/dL (ref 6–20)
CO2: 25 mmol/L (ref 22–32)
Calcium: 9 mg/dL (ref 8.9–10.3)
Chloride: 105 mmol/L (ref 98–111)
Creatinine, Ser: 0.88 mg/dL (ref 0.61–1.24)
GFR, Estimated: >60 mL/min (ref >60)
Glucose, Bld: 110 mg/dL — ABNORMAL HIGH (ref 70–99)
Potassium: 3.4 mmol/L — ABNORMAL LOW (ref 3.5–5.1)
Sodium: 138 mmol/L (ref 135–145)
Total Bilirubin: 7.3 mg/dL — ABNORMAL HIGH (ref 0.3–1.2)
Total Protein: 6.8 g/dL (ref 6.5–8.1)

## 2021-08-18 MED ORDER — FENTANYL CITRATE (PF) 100 MCG/2ML IJ SOLN
INTRAMUSCULAR | Status: AC
  Filled 2021-08-18: qty 4

## 2021-08-18 MED ORDER — MIDAZOLAM HCL 2 MG/2ML IJ SOLN
INTRAMUSCULAR | Status: AC
  Filled 2021-08-18: qty 4

## 2021-08-18 MED ORDER — LIDOCAINE HCL 1 % IJ SOLN
INTRAMUSCULAR | Status: AC
  Filled 2021-08-18: qty 20

## 2021-08-18 MED ORDER — GELATIN ABSORBABLE 12-7 MM EX MISC
CUTANEOUS | Status: AC
  Filled 2021-08-18: qty 1

## 2021-08-18 NOTE — Progress Notes (Signed)
PROGRESS NOTE    Jeffrey Blevins  XIH:[id number] DOB: 01/14/1966 DOA: 08/13/2021 PCP: Percell Belt, DO     Brief Narrative:   GERD, hiatal hernia, barrettes esophagus, esophageal dilatation, hypertension, Raynaud's phenomenon, right hydrocele with history of spermatocelectomy who is coming to the emergency department with complaints of jaundice.  Subjective:  Patient was seen earlier today, reports pain is reasonably controlled  1.9 L gallbladder drain documented  Is to have liver biopsy today Family at bedside  Assessment & Plan:  Principal Problem:   Biliary obstruction Active Problems:   Tobacco use   Hypertension   Hyponatremia   Prostate enlargement   Hepatic steatosis   GERD (gastroesophageal reflux disease)   Obstructive Jaundice: Transaminases, Hyperbilirubinemia:  Secondary to obstructive liver mass.  S/p perc drain on 6/20 by IR, continue hydration due to significant biliary drainage Cytology  from biliary drain fluid: Atypical cells background of reactive ductal cell. Non diagnostic CT guided biopsy on 6/21  ERCP with cholangioscopy and brushings can be attempted if biopsy nondiagnostic Appreciate GI AND IR INPUT  AKI Cr 1.29 on presentation, cr normalized  Hyponatremia Sodium 131 on presentation, likely from dehydration, normalized  HTN: Bp stable on current regimen  GERD:  -Continue with PPI>  Prostate Enlargement, seen on CT scan Started Tamsulosin.  Needs to follow up with urology out patient.   Slight splenomegaly, slight hepatic steatosis  seen on CT scan   Constipation/diverticulosis Seen on CTscan Started senn and miralax  Anxiety; Continue with  low dose xanax.   I have Reviewed nursing notes, Vitals, pain scores, I/o's, Lab results and  imaging results since pt's last encounter, details please see discussion above  I ordered the following labs:  Unresulted Labs (From admission, onward)     Start     Ordered   08/19/21 0500   CBC  Tomorrow morning,   R        08/18/21 1644   08/19/21 0500  Protime-INR  Tomorrow morning,   R        08/18/21 1644   08/18/21 0500  Comprehensive metabolic panel  Daily,   R      08/17/21 1632             DVT prophylaxis: SCDs Start: 08/14/21 3491   Code Status:   Code Status: Full Code  Family Communication: family at bedside  Disposition:   Status is: Inpatient   Dispo: The patient is from: home              Anticipated d/c is to: home              Anticipated d/c date is: TBD, need GI clearance   Antimicrobials:   Anti-infectives (From admission, onward)    Start     Dose/Rate Route Frequency Ordered Stop   08/16/21 1000  cefTRIAXone (ROCEPHIN) 2 g in sodium chloride 0.9 % 100 mL IVPB        2 g 200 mL/hr over 30 Minutes Intravenous  Once 08/16/21 0945 08/16/21 1028            Objective: Vitals:   08/17/21 2122 08/18/21 0644 08/18/21 1325 08/18/21 1649  BP: (!) 150/88 (!) 141/79 135/88 (!) 147/81  Pulse: 66 65 63 68  Resp: '20 14 18 18  '$ Temp: 99.5 F (37.5 C) 99 F (37.2 C) 98.7 F (37.1 C) 99.2 F (37.3 C)  TempSrc:  Oral Oral Oral  SpO2: 96% 94% 97% 98%  Weight:  Height:        Intake/Output Summary (Last 24 hours) at 08/18/2021 1935 Last data filed at 08/18/2021 1700 Gross per 24 hour  Intake 2962.71 ml  Output 2675 ml  Net 287.71 ml   Filed Weights   08/13/21 1604  Weight: 95.3 kg    Examination:  General exam: alert, awake, communicative,calm, NAD Respiratory system: Clear to auscultation. Respiratory effort normal. Cardiovascular system:  RRR.  Gastrointestinal system: Abdomen is nondistended, soft and nontender.  Normal bowel sounds heard. Central nervous system: Alert and oriented. No focal neurological deficits. Extremities:  no edema Skin: No rashes, lesions or ulcers Psychiatry: Judgement and insight appear normal. Mood & affect appropriate.     Data Reviewed: I have personally reviewed  labs and visualized   imaging studies since the last encounter and formulate the plan        Scheduled Meds:  amLODipine  10 mg Oral q morning   pantoprazole (PROTONIX) IV  40 mg Intravenous Q24H   polyethylene glycol  17 g Oral Daily   senna  1 tablet Oral Daily   Continuous Infusions:  sodium chloride 100 mL/hr at 08/18/21 0929     LOS: 4 days       Florencia Reasons, MD PhD FACP [facility]  Available via Epic secure chat 7am-7pm for nonurgent issues Please page for urgent issues To page the attending provider between 7A-7P or the covering provider during after hours 7P-7A, please log into the web site [email or web address] and access using universal [place] password for that web site. If you do not have the password, please call the hospital operator.    08/18/2021, 7:35 PM

## 2021-08-18 NOTE — Progress Notes (Signed)
Patient was evaluated in [place] for imaged guided liver lesion biopsy.  The area of concern is still inconspicuous with [place] and there is still a large amount of intrahepatic biliary dilatation.  Therefore, plan for repeat cholangiogram tomorrow with bile duct brush biopsy.  Discussed with patient.

## 2021-08-19 ENCOUNTER — Inpatient Hospital Stay (HOSPITAL_COMMUNITY): Payer: Managed Care, Other (non HMO)

## 2021-08-19 DIAGNOSIS — K831 Obstruction of bile duct: Secondary | ICD-10-CM | POA: Diagnosis not present

## 2021-08-19 HISTORY — PX: IR ENDOLUMINAL BX OF BILIARY TREE: IMG6053

## 2021-08-19 HISTORY — PX: IR EXCHANGE BILIARY DRAIN: IMG6046

## 2021-08-19 LAB — COMPREHENSIVE METABOLIC PANEL
ALT: 155 U/L — ABNORMAL HIGH (ref 0–44)
AST: 78 U/L — ABNORMAL HIGH (ref 15–41)
Albumin: 3.3 g/dL — ABNORMAL LOW (ref 3.5–5.0)
Alkaline Phosphatase: 244 U/L — ABNORMAL HIGH (ref 38–126)
Anion gap: 9 (ref 5–15)
BUN: 15 mg/dL (ref 6–20)
CO2: 22 mmol/L (ref 22–32)
Calcium: 9.1 mg/dL (ref 8.9–10.3)
Chloride: 106 mmol/L (ref 98–111)
Creatinine, Ser: 0.8 mg/dL (ref 0.61–1.24)
GFR, Estimated: >60 mL/min (ref >60)
Glucose, Bld: 118 mg/dL — ABNORMAL HIGH (ref 70–99)
Potassium: 3.6 mmol/L (ref 3.5–5.1)
Sodium: 137 mmol/L (ref 135–145)
Total Bilirubin: 7.4 mg/dL — ABNORMAL HIGH (ref 0.3–1.2)
Total Protein: 6.7 g/dL (ref 6.5–8.1)

## 2021-08-19 LAB — CBC
HCT: 34 % — ABNORMAL LOW (ref 39.0–52.0)
Hemoglobin: 11.4 g/dL — ABNORMAL LOW (ref 13.0–17.0)
MCH: 31.8 pg (ref 26.0–34.0)
MCHC: 33.5 g/dL (ref 30.0–36.0)
MCV: 94.7 fL (ref 80.0–100.0)
Platelets: 311 10*3/uL (ref 150–400)
RBC: 3.59 MIL/uL — ABNORMAL LOW (ref 4.22–5.81)
RDW: 13.2 % (ref 11.5–15.5)
WBC: 10.1 10*3/uL (ref 4.0–10.5)
nRBC: 0 % (ref 0.0–0.2)

## 2021-08-19 LAB — PROTIME-INR
INR: 0.9 (ref 0.8–1.2)
Prothrombin Time: 12.2 seconds (ref 11.4–15.2)

## 2021-08-19 MED ORDER — MIDAZOLAM HCL 2 MG/2ML IJ SOLN
INTRAMUSCULAR | Status: AC | PRN
  Administered 2021-08-19: 1 mg via INTRAVENOUS

## 2021-08-19 MED ORDER — IOHEXOL 300 MG/ML  SOLN
50.0000 mL | Freq: Once | INTRAMUSCULAR | Status: AC | PRN
Start: 2021-08-19 — End: 2021-08-19
  Administered 2021-08-19: 15 mL

## 2021-08-19 MED ORDER — MIDAZOLAM HCL 2 MG/2ML IJ SOLN
INTRAMUSCULAR | Status: AC | PRN
  Administered 2021-08-19: 2 mg via INTRAVENOUS

## 2021-08-19 MED ORDER — FENTANYL CITRATE (PF) 100 MCG/2ML IJ SOLN
INTRAMUSCULAR | Status: AC | PRN
  Administered 2021-08-19: 50 ug via INTRAVENOUS

## 2021-08-19 MED ORDER — LIDOCAINE-EPINEPHRINE 1 %-1:100000 IJ SOLN
INTRAMUSCULAR | Status: AC
  Filled 2021-08-19: qty 1

## 2021-08-19 MED ORDER — FENTANYL CITRATE (PF) 100 MCG/2ML IJ SOLN
INTRAMUSCULAR | Status: AC
  Filled 2021-08-19: qty 6

## 2021-08-19 MED ORDER — FENTANYL CITRATE (PF) 100 MCG/2ML IJ SOLN
INTRAMUSCULAR | Status: AC | PRN
  Administered 2021-08-19: 100 ug via INTRAVENOUS

## 2021-08-19 MED ORDER — MIDAZOLAM HCL 2 MG/2ML IJ SOLN
INTRAMUSCULAR | Status: AC
  Filled 2021-08-19: qty 6

## 2021-08-19 NOTE — Progress Notes (Signed)
PROGRESS NOTE    Jeffrey Blevins  QIH:[id number] DOB: Jun 05, 1965 DOA: 08/13/2021 PCP: Jeffrey Belt, DO     Brief Narrative:   GERD, hiatal hernia, barrettes esophagus, esophageal dilatation, hypertension, Raynaud's phenomenon, right hydrocele with history of spermatocelectomy who is coming to the emergency department with complaints of jaundice.  Subjective:  He is npo awaiting for IR procedure Reports pull muscle on left upper back when try to get out of bed    1.4L gallbladder drain documented last 24 hours   Family at bedside  Assessment & Plan:  Principal Problem:   Biliary obstruction Active Problems:   Tobacco use   Hypertension   Hyponatremia   Prostate enlargement   Hepatic steatosis   GERD (gastroesophageal reflux disease)   Obstructive Jaundice: Transaminases, Hyperbilirubinemia:  Secondary to obstructive liver mass.  S/p perc drain on 6/20 by IR, continue hydration due to significant biliary drainage Cytology  from biliary drain fluid: Atypical cells background of reactive ductal cell. Non diagnostic Persistent intrahepatic biliary dilatation, Plan to have cholangiogram with bile duct brush biopsy on 6/22 by IR Continue hydration due to significant amount of biliary drain output  Appreciate GI AND IR INPUT  AKI Cr 1.29 on presentation, cr normalized  Hyponatremia Sodium 131 on presentation, likely from dehydration, normalized  HTN: Bp stable on current regimen  GERD:  -Continue with PPI  Prostate Enlargement, seen on CT scan Started Tamsulosin.  Needs to follow up with urology out patient.   Slight splenomegaly, slight hepatic steatosis  seen on CT scan   Constipation/diverticulosis Seen on CTscan Started senn and miralax  Anxiety; Continue with  low dose xanax.   I have Reviewed nursing notes, Vitals, pain scores, I/o's, Lab results and  imaging results since pt's last encounter, details please see discussion above  I ordered the  following labs:  Unresulted Labs (From admission, onward)     Start     Ordered   08/20/21 0500  CBC with Differential/Platelet  Tomorrow morning,   R        08/19/21 1659   08/20/21 0500  Magnesium  Tomorrow morning,   R        08/19/21 1659   08/18/21 0500  Comprehensive metabolic panel  Daily,   R      08/17/21 1632             DVT prophylaxis: SCDs Start: 08/14/21 8756   Code Status:   Code Status: Full Code  Family Communication: family at bedside  Disposition:   Status is: Inpatient   Dispo: The patient is from: home              Anticipated d/c is to: home              Anticipated d/c date is: TBD, need GI /IR clearance   Antimicrobials:   Anti-infectives (From admission, onward)    Start     Dose/Rate Route Frequency Ordered Stop   08/16/21 1000  cefTRIAXone (ROCEPHIN) 2 g in sodium chloride 0.9 % 100 mL IVPB        2 g 200 mL/hr over 30 Minutes Intravenous  Once 08/16/21 0945 08/16/21 1028            Objective: Vitals:   08/19/21 1355 08/19/21 1356 08/19/21 1400 08/19/21 1413  BP:  (!) 171/88 (!) 153/95 (!) 147/80  Pulse: 66 71 71 67  Resp: '17 15  20  '$ Temp:    98.2 F (36.8 C)  TempSrc:      SpO2: 99%  100% 94%  Weight:      Height:        Intake/Output Summary (Last 24 hours) at 08/19/2021 1703 Last data filed at 08/19/2021 1249 Gross per 24 hour  Intake 1947.47 ml  Output 2050 ml  Net -102.53 ml   Filed Weights   08/13/21 1604  Weight: 95.3 kg    Examination:  General exam: alert, awake, communicative,calm, NAD Respiratory system: Clear to auscultation. Respiratory effort normal. Cardiovascular system:  RRR.  Gastrointestinal system: Abdomen is nondistended, soft and nontender.  Normal bowel sounds heard. Central nervous system: Alert and oriented. No focal neurological deficits. Extremities:  no edema Skin: No rashes, lesions or ulcers Psychiatry: Judgement and insight appear normal. Mood & affect appropriate.     Data  Reviewed: I have personally reviewed  labs and visualized  imaging studies since the last encounter and formulate the plan        Scheduled Meds:  amLODipine  10 mg Oral q morning   lidocaine-EPINEPHrine       pantoprazole (PROTONIX) IV  40 mg Intravenous Q24H   polyethylene glycol  17 g Oral Daily   senna  1 tablet Oral Daily   Continuous Infusions:  sodium chloride 100 mL/hr at 08/19/21 0300     LOS: 5 days       Jeffrey Reasons, MD PhD FACP [facility]  Available via Epic secure chat 7am-7pm for nonurgent issues Please page for urgent issues To page the attending provider between 7A-7P or the covering provider during after hours 7P-7A, please log into the web site [email or web address] and access using universal [place] password for that web site. If you do not have the password, please call the hospital operator.    08/19/2021, 5:03 PM

## 2021-08-19 NOTE — H&P (Addendum)
HPI:  The patient has had a H&P performed within the last 30 days, all history, medications, and exam have been reviewed. The patient denies any interval changes since the H&P.  Medications: Prior to Admission medications   Medication Sig Start Date End Date Taking? Authorizing Provider  acetaminophen (TYLENOL) 500 MG tablet Take 1,000 mg by mouth 3 (three) times daily as needed for headache (pain).   Yes [provider]  amLODipine (NORVASC) 10 MG tablet Take 10 mg by mouth every morning.   Yes [provider]  esomeprazole (NEXIUM) 20 MG capsule Take 20 mg by mouth every morning.   Yes [provider]  ibuprofen (ADVIL) 200 MG tablet Take 400 mg by mouth 3 (three) times daily as needed for headache (pain).   Yes [provider]  irbesartan (AVAPRO) 150 MG tablet Take 150 mg by mouth at bedtime.   Yes [provider]     Vital Signs: BP (!) 148/88 (BP Location: Left Arm)   Pulse 75   Temp 99.1 F (37.3 C)   Resp 18   Ht 6\' 2"  (1.88 m)   Wt 210 lb (95.3 kg)   SpO2 96%   BMI 26.96 kg/m   Physical Exam Constitutional:      General: He is not in acute distress.    Appearance: Normal appearance. He is not ill-appearing.  HENT:     Head: Normocephalic and atraumatic.     Mouth/Throat:     Mouth: Mucous membranes are dry.     Pharynx: Oropharynx is clear.  Eyes:     Extraocular Movements: Extraocular movements intact.     Pupils: Pupils are equal, round, and reactive to light.  Cardiovascular:     Rate and Rhythm: Normal rate and regular rhythm.     Pulses: Normal pulses.     Heart sounds: Normal heart sounds.  Pulmonary:     Effort: Pulmonary effort is normal. No respiratory distress.     Breath sounds: Normal breath sounds.  Abdominal:     Comments: Right biliary drain in place to gravity bag.   Musculoskeletal:     Right lower leg: No edema.     Left lower leg: No edema.  Skin:    General: Skin is warm and dry.   Neurological:     Mental Status: He is alert and oriented to person, place, and time.  Psychiatric:        Mood and Affect: Mood normal.        Behavior: Behavior normal.        Thought Content: Thought content normal.        Judgment: Judgment normal.     Mallampati Score:  MD Evaluation Airway: WNL Heart: WNL Abdomen: Other (comments) Abdomen comments: RUQ drain in place Chest/ Lungs: WNL ASA  Classification: 3 Mallampati/Airway Score: Two  Labs:  CBC: Recent Labs    08/16/21 0437 08/17/21 0510 08/18/21 0619 08/19/21 0536  WBC 5.7 7.6 8.0 10.1  HGB 12.9* 12.0* 11.5* 11.4*  HCT 37.8* 34.8* 35.1* 34.0*  PLT 253 246 249 311    COAGS: Recent Labs    08/13/21 1945 08/16/21 0437 08/19/21 0536  INR 0.9 0.8 0.9    BMP: Recent Labs    08/16/21 0437 08/17/21 0510 08/18/21 0619 08/19/21 0536  NA 140 139 138 137  K 3.7 3.6 3.4* 3.6  CL 108 107 105 106  CO2 23 24 25 22   GLUCOSE 107* 126* 110* 118*  BUN 15 11  17 15  CALCIUM 9.2 9.1 9.0 9.1  CREATININE 0.76 0.83 0.88 0.80  GFRNONAA >60 >60 >60 >60    LIVER FUNCTION TESTS: Recent Labs    08/16/21 0437 08/17/21 0510 08/18/21 0619 08/19/21 0536  BILITOT 11.5* 10.3* 7.3* 7.4*  AST 112* 90* 69* 78*  ALT 212* 184* 152* 155*  ALKPHOS 271* 274* 258* 244*  PROT 6.7 6.4* 6.8 6.7  ALBUMIN 3.7 3.4* 3.3* 3.3*    Assessment/Plan:  Pt well known to IR having PTC and right biliary drain placement 08/16/21 with Dr. Lowella Dandy. Dr. Lowella Dandy felt at that time the patient would not be able to tolerate another drain at that time. Pt returned 08/18/21 for US liver lesion biopsy. Dr. Lowella Dandy states that the area of concern was still inconspicuous using [place] and recommends bile duct brush biopsy. Dr. Elby Showers approved cholangiogram with bile duct brush biopsy and left biliary drain placement due to biliary dilatation.   Risks and benefits of bile duct brush biopsy was discussed with the patient and/or patient's family including, but not  limited to bleeding, infection, damage to adjacent structures or low yield requiring additional tests.  Risks and benefits of percutaneous transhepatic cholangiogram and biliary drain placement discussed with the patient including bleeding, infection, damage to adjacent structures, bowel perforation/fistula connection, and sepsis.  All of the patient's questions were answered, patient is agreeable to proceed.  Consent signed and in chart.     Signed: Shon Hough 08/19/2021, 8:41 AM

## 2021-08-19 NOTE — Procedures (Signed)
Interventional Radiology Procedure Note  Procedure:  1) Cholangiogram via indwelling right biliary drain 2) Hilar biliary brush biopsy 3) Biliary drain exchange  Findings: Please refer to procedural dictation for full description. Indwelling right sided drain decompressing left sided ducts.  Brush biopsy performed at confluence of left and right biliary ducts.  Biliary drain upsize to 12 Fr, placed to bag drainage.  Complications: None immediate  Estimated Blood Loss: < 5 mL  Recommendations: Keep to bag drainage for now. Follow up cytology results. IR will follow.   Ruthann Cancer, MD Pager: [phone]

## 2021-08-20 ENCOUNTER — Inpatient Hospital Stay (HOSPITAL_COMMUNITY): Payer: Managed Care, Other (non HMO)

## 2021-08-20 DIAGNOSIS — K831 Obstruction of bile duct: Secondary | ICD-10-CM | POA: Diagnosis not present

## 2021-08-20 LAB — COMPREHENSIVE METABOLIC PANEL
ALT: 151 U/L — ABNORMAL HIGH (ref 0–44)
AST: 81 U/L — ABNORMAL HIGH (ref 15–41)
Albumin: 2.9 g/dL — ABNORMAL LOW (ref 3.5–5.0)
Alkaline Phosphatase: 195 U/L — ABNORMAL HIGH (ref 38–126)
Anion gap: 11 (ref 5–15)
BUN: 15 mg/dL (ref 6–20)
CO2: 24 mmol/L (ref 22–32)
Calcium: 8.9 mg/dL (ref 8.9–10.3)
Chloride: 101 mmol/L (ref 98–111)
Creatinine, Ser: 0.83 mg/dL (ref 0.61–1.24)
GFR, Estimated: >60 mL/min (ref >60)
Glucose, Bld: 123 mg/dL — ABNORMAL HIGH (ref 70–99)
Potassium: 3.8 mmol/L (ref 3.5–5.1)
Sodium: 136 mmol/L (ref 135–145)
Total Bilirubin: 10.7 mg/dL — ABNORMAL HIGH (ref 0.3–1.2)
Total Protein: 6.6 g/dL (ref 6.5–8.1)

## 2021-08-20 LAB — CBC WITH DIFFERENTIAL/PLATELET
Abs Immature Granulocytes: 0.1 10*3/uL — ABNORMAL HIGH (ref 0.00–0.07)
Basophils Absolute: 0 10*3/uL (ref 0.0–0.1)
Basophils Relative: 0 %
Eosinophils Absolute: 0 10*3/uL (ref 0.0–0.5)
Eosinophils Relative: 0 %
HCT: 33.4 % — ABNORMAL LOW (ref 39.0–52.0)
Hemoglobin: 11.2 g/dL — ABNORMAL LOW (ref 13.0–17.0)
Immature Granulocytes: 1 %
Lymphocytes Relative: 6 %
Lymphs Abs: 0.9 10*3/uL (ref 0.7–4.0)
MCH: 31.5 pg (ref 26.0–34.0)
MCHC: 33.5 g/dL (ref 30.0–36.0)
MCV: 94.1 fL (ref 80.0–100.0)
Monocytes Absolute: 1.3 10*3/uL — ABNORMAL HIGH (ref 0.1–1.0)
Monocytes Relative: 9 %
Neutro Abs: 13 10*3/uL — ABNORMAL HIGH (ref 1.7–7.7)
Neutrophils Relative %: 84 %
Platelets: 300 10*3/uL (ref 150–400)
RBC: 3.55 MIL/uL — ABNORMAL LOW (ref 4.22–5.81)
RDW: 12.9 % (ref 11.5–15.5)
WBC: 15.3 10*3/uL — ABNORMAL HIGH (ref 4.0–10.5)
nRBC: 0 % (ref 0.0–0.2)

## 2021-08-20 LAB — URINALYSIS, ROUTINE W REFLEX MICROSCOPIC
Glucose, UA: NEGATIVE mg/dL
Ketones, ur: NEGATIVE mg/dL
Leukocytes,Ua: NEGATIVE
Nitrite: NEGATIVE
Protein, ur: 30 mg/dL — AB
Specific Gravity, Urine: 1.021 (ref 1.005–1.030)
pH: 5 (ref 5.0–8.0)

## 2021-08-20 LAB — MAGNESIUM: Magnesium: 1.7 mg/dL (ref 1.7–2.4)

## 2021-08-20 LAB — MRSA NEXT GEN BY PCR, NASAL: MRSA by PCR Next Gen: NOT DETECTED

## 2021-08-20 MED ORDER — ENSURE ENLIVE PO LIQD
237.0000 mL | Freq: Two times a day (BID) | ORAL | Status: DC
  Administered 2021-08-20 – 2021-08-23 (×5): 237 mL via ORAL

## 2021-08-20 MED ORDER — ACETAMINOPHEN 325 MG PO TABS
325.0000 mg | ORAL_TABLET | Freq: Once | ORAL | Status: AC
Start: 2021-08-20 — End: 2021-08-20
  Administered 2021-08-20: 325 mg via ORAL
  Filled 2021-08-20: qty 1

## 2021-08-20 MED ORDER — SODIUM CHLORIDE 0.9% FLUSH
5.0000 mL | Freq: Three times a day (TID) | INTRAVENOUS | Status: DC
  Administered 2021-08-20 – 2021-08-25 (×14): 5 mL

## 2021-08-20 MED ORDER — SODIUM CHLORIDE 0.9% FLUSH: 5.0000 mL | Freq: Three times a day (TID) | INTRAVENOUS | Status: DC

## 2021-08-20 MED ORDER — PIPERACILLIN-TAZOBACTAM 3.375 G IVPB
3.3750 g | Freq: Three times a day (TID) | INTRAVENOUS | Status: DC
  Administered 2021-08-20 – 2021-08-23 (×10): 3.375 g via INTRAVENOUS
  Filled 2021-08-20 (×11): qty 50

## 2021-08-20 MED ORDER — IBUPROFEN 200 MG PO TABS
400.0000 mg | ORAL_TABLET | Freq: Four times a day (QID) | ORAL | Status: DC | PRN
  Administered 2021-08-20 – 2021-08-23 (×4): 400 mg via ORAL
  Filled 2021-08-20 (×4): qty 2

## 2021-08-21 DIAGNOSIS — K831 Obstruction of bile duct: Secondary | ICD-10-CM | POA: Diagnosis not present

## 2021-08-21 DIAGNOSIS — R509 Fever, unspecified: Secondary | ICD-10-CM

## 2021-08-21 LAB — CBC WITH DIFFERENTIAL/PLATELET
Abs Immature Granulocytes: 0.06 10*3/uL (ref 0.00–0.07)
Basophils Absolute: 0.1 10*3/uL (ref 0.0–0.1)
Basophils Relative: 1 %
Eosinophils Absolute: 0.1 10*3/uL (ref 0.0–0.5)
Eosinophils Relative: 1 %
HCT: 30.6 % — ABNORMAL LOW (ref 39.0–52.0)
Hemoglobin: 10.3 g/dL — ABNORMAL LOW (ref 13.0–17.0)
Immature Granulocytes: 1 %
Lymphocytes Relative: 6 %
Lymphs Abs: 0.5 10*3/uL — ABNORMAL LOW (ref 0.7–4.0)
MCH: 32 pg (ref 26.0–34.0)
MCHC: 33.7 g/dL (ref 30.0–36.0)
MCV: 95 fL (ref 80.0–100.0)
Monocytes Absolute: 0.8 10*3/uL (ref 0.1–1.0)
Monocytes Relative: 10 %
Neutro Abs: 6.5 10*3/uL (ref 1.7–7.7)
Neutrophils Relative %: 81 %
Platelets: 263 10*3/uL (ref 150–400)
RBC: 3.22 MIL/uL — ABNORMAL LOW (ref 4.22–5.81)
RDW: 13.1 % (ref 11.5–15.5)
WBC: 7.9 10*3/uL (ref 4.0–10.5)
nRBC: 0 % (ref 0.0–0.2)

## 2021-08-21 LAB — COMPREHENSIVE METABOLIC PANEL
ALT: 124 U/L — ABNORMAL HIGH (ref 0–44)
AST: 66 U/L — ABNORMAL HIGH (ref 15–41)
Albumin: 2.8 g/dL — ABNORMAL LOW (ref 3.5–5.0)
Alkaline Phosphatase: 165 U/L — ABNORMAL HIGH (ref 38–126)
Anion gap: 9 (ref 5–15)
BUN: 17 mg/dL (ref 6–20)
CO2: 25 mmol/L (ref 22–32)
Calcium: 8.7 mg/dL — ABNORMAL LOW (ref 8.9–10.3)
Chloride: 101 mmol/L (ref 98–111)
Creatinine, Ser: 0.83 mg/dL (ref 0.61–1.24)
GFR, Estimated: >60 mL/min (ref >60)
Glucose, Bld: 143 mg/dL — ABNORMAL HIGH (ref 70–99)
Potassium: 3.2 mmol/L — ABNORMAL LOW (ref 3.5–5.1)
Sodium: 135 mmol/L (ref 135–145)
Total Bilirubin: 9.6 mg/dL — ABNORMAL HIGH (ref 0.3–1.2)
Total Protein: 6.2 g/dL — ABNORMAL LOW (ref 6.5–8.1)

## 2021-08-21 LAB — URINE CULTURE: Culture: NO GROWTH

## 2021-08-21 MED ORDER — SACCHAROMYCES BOULARDII 250 MG PO CAPS
250.0000 mg | ORAL_CAPSULE | Freq: Two times a day (BID) | ORAL | Status: DC
  Administered 2021-08-21 – 2021-08-26 (×9): 250 mg via ORAL
  Filled 2021-08-21 (×9): qty 1

## 2021-08-22 DIAGNOSIS — K831 Obstruction of bile duct: Secondary | ICD-10-CM | POA: Diagnosis not present

## 2021-08-22 DIAGNOSIS — R7989 Other specified abnormal findings of blood chemistry: Secondary | ICD-10-CM

## 2021-08-22 LAB — COMPREHENSIVE METABOLIC PANEL
ALT: 120 U/L — ABNORMAL HIGH (ref 0–44)
AST: 69 U/L — ABNORMAL HIGH (ref 15–41)
Albumin: 2.8 g/dL — ABNORMAL LOW (ref 3.5–5.0)
Alkaline Phosphatase: 157 U/L — ABNORMAL HIGH (ref 38–126)
Anion gap: 8 (ref 5–15)
BUN: 14 mg/dL (ref 6–20)
CO2: 25 mmol/L (ref 22–32)
Calcium: 8.9 mg/dL (ref 8.9–10.3)
Chloride: 106 mmol/L (ref 98–111)
Creatinine, Ser: 1.08 mg/dL (ref 0.61–1.24)
GFR, Estimated: >60 mL/min (ref >60)
Glucose, Bld: 105 mg/dL — ABNORMAL HIGH (ref 70–99)
Potassium: 3.2 mmol/L — ABNORMAL LOW (ref 3.5–5.1)
Sodium: 139 mmol/L (ref 135–145)
Total Bilirubin: 8.2 mg/dL — ABNORMAL HIGH (ref 0.3–1.2)
Total Protein: 6.4 g/dL — ABNORMAL LOW (ref 6.5–8.1)

## 2021-08-22 LAB — MAGNESIUM: Magnesium: 2.3 mg/dL (ref 1.7–2.4)

## 2021-08-22 MED ORDER — PANTOPRAZOLE SODIUM 40 MG PO TBEC
40.0000 mg | DELAYED_RELEASE_TABLET | Freq: Every day | ORAL | Status: DC
  Administered 2021-08-23 – 2021-08-26 (×3): 40 mg via ORAL
  Filled 2021-08-22 (×3): qty 1

## 2021-08-22 MED ORDER — GUAIFENESIN ER 600 MG PO TB12
600.0000 mg | ORAL_TABLET | Freq: Two times a day (BID) | ORAL | Status: DC
  Administered 2021-08-22 – 2021-08-26 (×8): 600 mg via ORAL
  Filled 2021-08-22 (×8): qty 1

## 2021-08-22 MED ORDER — STERILE WATER FOR INJECTION IJ SOLN
INTRAMUSCULAR | Status: AC
  Administered 2021-08-22: 10 mL
  Filled 2021-08-22: qty 10

## 2021-08-22 MED ORDER — FLUTICASONE PROPIONATE 50 MCG/ACT NA SUSP
2.0000 | Freq: Every day | NASAL | Status: DC
  Administered 2021-08-22 – 2021-08-26 (×3): 2 via NASAL
  Filled 2021-08-22: qty 16

## 2021-08-22 MED ORDER — POTASSIUM CHLORIDE 20 MEQ PO PACK
40.0000 meq | PACK | Freq: Once | ORAL | Status: AC
  Administered 2021-08-22: 40 meq via ORAL
  Filled 2021-08-22: qty 2

## 2021-08-23 DIAGNOSIS — E871 Hypo-osmolality and hyponatremia: Secondary | ICD-10-CM | POA: Diagnosis not present

## 2021-08-23 DIAGNOSIS — K831 Obstruction of bile duct: Secondary | ICD-10-CM | POA: Diagnosis not present

## 2021-08-23 DIAGNOSIS — K760 Fatty (change of) liver, not elsewhere classified: Secondary | ICD-10-CM | POA: Diagnosis not present

## 2021-08-23 LAB — CBC
HCT: 32 % — ABNORMAL LOW (ref 39.0–52.0)
Hemoglobin: 10.7 g/dL — ABNORMAL LOW (ref 13.0–17.0)
MCH: 31.7 pg (ref 26.0–34.0)
MCHC: 33.4 g/dL (ref 30.0–36.0)
MCV: 94.7 fL (ref 80.0–100.0)
Platelets: 294 10*3/uL (ref 150–400)
RBC: 3.38 MIL/uL — ABNORMAL LOW (ref 4.22–5.81)
RDW: 13.1 % (ref 11.5–15.5)
WBC: 6.3 10*3/uL (ref 4.0–10.5)
nRBC: 0 % (ref 0.0–0.2)

## 2021-08-23 LAB — COMPREHENSIVE METABOLIC PANEL
ALT: 117 U/L — ABNORMAL HIGH (ref 0–44)
AST: 64 U/L — ABNORMAL HIGH (ref 15–41)
Albumin: 2.8 g/dL — ABNORMAL LOW (ref 3.5–5.0)
Alkaline Phosphatase: 148 U/L — ABNORMAL HIGH (ref 38–126)
Anion gap: 8 (ref 5–15)
BUN: 16 mg/dL (ref 6–20)
CO2: 24 mmol/L (ref 22–32)
Calcium: 8.7 mg/dL — ABNORMAL LOW (ref 8.9–10.3)
Chloride: 106 mmol/L (ref 98–111)
Creatinine, Ser: 0.93 mg/dL (ref 0.61–1.24)
GFR, Estimated: >60 mL/min (ref >60)
Glucose, Bld: 112 mg/dL — ABNORMAL HIGH (ref 70–99)
Potassium: 3.9 mmol/L (ref 3.5–5.1)
Sodium: 138 mmol/L (ref 135–145)
Total Bilirubin: 6.1 mg/dL — ABNORMAL HIGH (ref 0.3–1.2)
Total Protein: 6.5 g/dL (ref 6.5–8.1)

## 2021-08-23 LAB — BODY FLUID CULTURE W GRAM STAIN
Gram Stain: NONE SEEN
Special Requests: NORMAL

## 2021-08-23 LAB — CYTOLOGY - NON PAP

## 2021-08-23 MED ORDER — SODIUM CHLORIDE 0.9 % IV SOLN
3.0000 g | Freq: Four times a day (QID) | INTRAVENOUS | Status: DC
  Administered 2021-08-23 – 2021-08-26 (×11): 3 g via INTRAVENOUS
  Filled 2021-08-23 (×12): qty 8

## 2021-08-23 NOTE — Progress Notes (Addendum)
Referring Physician(s): Stan Head  Supervising Physician: Richarda Overlie  Patient Status:  [facility] - In-pt  Chief Complaint:  Right hepatic/hilar mass and biliary ductal dilation with obstructive jaundice - s/p 10 Fr right hepatic duct ext/int biliary drain placement by Dr. Lowella Dandy on 6/19 - s/p cholangiogram, hiliar biliary brush bx, and biliary drain exchange to 12 Fr by Dr. Elby Showers on 6/22. Of note, indwelling right sided drain is decompressing left sided ducts.  Subjective:  Pt sitting upright in recliner. Pt wife and family at bedside. He has no complaints.  He reports some return of color to stools, but urine is still tea-colored. He states he has an appetite and still eating with no nausea/emesis.  Pt and wife states pt to have ERCP.   Allergies: Codeine  Medications: Prior to Admission medications   Medication Sig Start Date End Date Taking? Authorizing Provider  acetaminophen (TYLENOL) 500 MG tablet Take 1,000 mg by mouth 3 (three) times daily as needed for headache (pain).   Yes [provider]  amLODipine (NORVASC) 10 MG tablet Take 10 mg by mouth every morning.   Yes [provider]  esomeprazole (NEXIUM) 20 MG capsule Take 20 mg by mouth every morning.   Yes [provider]  ibuprofen (ADVIL) 200 MG tablet Take 400 mg by mouth 3 (three) times daily as needed for headache (pain).   Yes [provider]  irbesartan (AVAPRO) 150 MG tablet Take 150 mg by mouth at bedtime.   Yes [provider]     Vital Signs: BP 134/89 (BP Location: Left Arm)   Pulse 65   Temp 98.2 F (36.8 C) (Oral)   Resp 20   Ht 6\' 2"  (1.88 m)   Wt 210 lb (95.3 kg)   SpO2 100%   BMI 26.96 kg/m   Physical Exam Vitals reviewed.  Constitutional:      General: He is not in acute distress.    Appearance: He is ill-appearing.  HENT:     Head: Normocephalic and atraumatic.  Eyes:     General: Scleral icterus present.     Extraocular Movements:  Extraocular movements intact.     Pupils: Pupils are equal, round, and reactive to light.  Pulmonary:     Effort: Pulmonary effort is normal. No respiratory distress.  Abdominal:     Comments: Drain flushes/aspirates easily. Site has small amt circumferential redness with sutures/statlock in place. ~ 100 cc bilious OP in gravity bag. Dressing has small amount dried, brown OP. No leaking, bleeding noted. Dressing intact.   Skin:    Coloration: Skin is jaundiced.  Neurological:     Mental Status: He is alert.     Imaging: DG CHEST PORT 1 VIEW  Result Date: 08/20/2021 CLINICAL DATA:  Encounter for fever. EXAM: PORTABLE CHEST 1 VIEW COMPARISON:  None Available. FINDINGS: Cardiac silhouette and mediastinal contours are within normal limits. The lungs are clear. No pleural effusion or pneumothorax. Moderate multilevel degenerative bridging osteophytes of the thoracic spine. IMPRESSION: No acute cardiopulmonary disease process. Electronically Signed   By: Neita Garnet M.D.   On: 08/20/2021 09:09    Labs:  CBC: Recent Labs    08/19/21 0536 08/20/21 0551 08/21/21 0539 08/23/21 0517  WBC 10.1 15.3* 7.9 6.3  HGB 11.4* 11.2* 10.3* 10.7*  HCT 34.0* 33.4* 30.6* 32.0*  PLT 311 300 263 294    COAGS: Recent Labs    08/13/21 1945 08/16/21 0437 08/19/21 0536  INR 0.9 0.8 0.9  BMP: Recent Labs    08/20/21 0551 08/21/21 0539 08/22/21 0532 08/23/21 0517  NA 136 135 139 138  K 3.8 3.2* 3.2* 3.9  CL 101 101 106 106  CO2 24 25 25 24   GLUCOSE 123* 143* 105* 112*  BUN 15 17 14 16   CALCIUM 8.9 8.7* 8.9 8.7*  CREATININE 0.83 0.83 1.08 0.93  GFRNONAA >60 >60 >60 >60    LIVER FUNCTION TESTS: Recent Labs    08/20/21 0551 08/21/21 0539 08/22/21 0532 08/23/21 0517  BILITOT 10.7* 9.6* 8.2* 6.1*  AST 81* 66* 69* 64*  ALT 151* 124* 120* 117*  ALKPHOS 195* 165* 157* 148*  PROT 6.6 6.2* 6.4* 6.5  ALBUMIN 2.9* 2.8* 2.8* 2.8*    Assessment and Plan: Drain flushes/aspirates  easily.  Small area of circumferential redness to site. There is small amt dried, brown drainage on dressing. ~100 cc bilious OP in gravity bag with 1725 cc documented in 24 hrs.   Dressing intact.  WBC WNL T bili 6.1 (8.2)  Shared with pt and wife results from brush biopsy are suspicious for malignancy but does not meet criteria for definitive diagnosis.   Plan: Bili drain output high, keep pt well hydrated to prevent dehydration   Record output Q shift. Dressing changes QD or PRN if soiled.  Call IR APP or on call IR MD if difficulty flushing or sudden change in drain output.     Discharge planning: Please contact IR APP or on call IR MD prior to patient d/c to ensure appropriate follow up plans are in place.    IR will continue to follow - please call with questions or concerns.    Electronically Signed: Shon Hough, NP 08/23/2021, 3:00 PM   I spent a total of 15 Minutes at the the patient's bedside AND on the patient's hospital floor or unit, greater than 50% of which was counseling/coordinating care for internal/external right hepatic duct biliary drain.

## 2021-08-24 DIAGNOSIS — K831 Obstruction of bile duct: Secondary | ICD-10-CM | POA: Diagnosis not present

## 2021-08-24 DIAGNOSIS — K760 Fatty (change of) liver, not elsewhere classified: Secondary | ICD-10-CM | POA: Diagnosis not present

## 2021-08-24 DIAGNOSIS — E871 Hypo-osmolality and hyponatremia: Secondary | ICD-10-CM | POA: Diagnosis not present

## 2021-08-24 LAB — COMPREHENSIVE METABOLIC PANEL
ALT: 121 U/L — ABNORMAL HIGH (ref 0–44)
AST: 74 U/L — ABNORMAL HIGH (ref 15–41)
Albumin: 2.9 g/dL — ABNORMAL LOW (ref 3.5–5.0)
Alkaline Phosphatase: 152 U/L — ABNORMAL HIGH (ref 38–126)
Anion gap: 8 (ref 5–15)
BUN: 15 mg/dL (ref 6–20)
CO2: 23 mmol/L (ref 22–32)
Calcium: 8.9 mg/dL (ref 8.9–10.3)
Chloride: 108 mmol/L (ref 98–111)
Creatinine, Ser: 0.9 mg/dL (ref 0.61–1.24)
GFR, Estimated: >60 mL/min (ref >60)
Glucose, Bld: 102 mg/dL — ABNORMAL HIGH (ref 70–99)
Potassium: 3.6 mmol/L (ref 3.5–5.1)
Sodium: 139 mmol/L (ref 135–145)
Total Bilirubin: 4.9 mg/dL — ABNORMAL HIGH (ref 0.3–1.2)
Total Protein: 6.3 g/dL — ABNORMAL LOW (ref 6.5–8.1)

## 2021-08-24 MED ORDER — ALUM & MAG HYDROXIDE-SIMETH 200-200-20 MG/5ML PO SUSP
30.0000 mL | ORAL | Status: DC | PRN
  Administered 2021-08-24: 30 mL via ORAL
  Filled 2021-08-24: qty 30

## 2021-08-25 ENCOUNTER — Inpatient Hospital Stay (HOSPITAL_COMMUNITY): Payer: Managed Care, Other (non HMO)

## 2021-08-25 DIAGNOSIS — K831 Obstruction of bile duct: Secondary | ICD-10-CM | POA: Diagnosis not present

## 2021-08-25 DIAGNOSIS — K760 Fatty (change of) liver, not elsewhere classified: Secondary | ICD-10-CM | POA: Diagnosis not present

## 2021-08-25 DIAGNOSIS — E871 Hypo-osmolality and hyponatremia: Secondary | ICD-10-CM | POA: Diagnosis not present

## 2021-08-25 LAB — CULTURE, BLOOD (ROUTINE X 2)
Culture: NO GROWTH
Culture: NO GROWTH
Special Requests: ADEQUATE
Special Requests: ADEQUATE

## 2021-08-25 LAB — CBC
HCT: 32.7 % — ABNORMAL LOW (ref 39.0–52.0)
Hemoglobin: 10.8 g/dL — ABNORMAL LOW (ref 13.0–17.0)
MCH: 31 pg (ref 26.0–34.0)
MCHC: 33 g/dL (ref 30.0–36.0)
MCV: 94 fL (ref 80.0–100.0)
Platelets: 319 10*3/uL (ref 150–400)
RBC: 3.48 MIL/uL — ABNORMAL LOW (ref 4.22–5.81)
RDW: 13.1 % (ref 11.5–15.5)
WBC: 7.7 10*3/uL (ref 4.0–10.5)
nRBC: 0 % (ref 0.0–0.2)

## 2021-08-25 LAB — PROTIME-INR
INR: 1.1 (ref 0.8–1.2)
Prothrombin Time: 13.9 seconds (ref 11.4–15.2)

## 2021-08-25 MED ORDER — NALOXONE HCL 0.4 MG/ML IJ SOLN
INTRAMUSCULAR | Status: AC
  Filled 2021-08-25: qty 1

## 2021-08-25 MED ORDER — FLUMAZENIL 0.5 MG/5ML IV SOLN
INTRAVENOUS | Status: AC
  Filled 2021-08-25: qty 5

## 2021-08-25 MED ORDER — MIDAZOLAM HCL 2 MG/2ML IJ SOLN
INTRAMUSCULAR | Status: AC | PRN
  Administered 2021-08-25: 1 mg via INTRAVENOUS

## 2021-08-25 MED ORDER — MIDAZOLAM HCL 2 MG/2ML IJ SOLN
INTRAMUSCULAR | Status: AC
  Filled 2021-08-25: qty 4

## 2021-08-25 MED ORDER — FENTANYL CITRATE (PF) 100 MCG/2ML IJ SOLN
INTRAMUSCULAR | Status: AC | PRN
  Administered 2021-08-25: 50 ug via INTRAVENOUS

## 2021-08-25 MED ORDER — FENTANYL CITRATE (PF) 100 MCG/2ML IJ SOLN
INTRAMUSCULAR | Status: AC
  Filled 2021-08-25: qty 4

## 2021-08-25 NOTE — Progress Notes (Signed)
[facility] PROGRESS NOTE    Progress Note  GARYSON STELLY  HTD:[id number] DOB: 1966/01/10 DOA: 08/13/2021 PCP: Percell Belt, DO     Brief Narrative:   Jeffrey Blevins is an 56 y.o. 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  CT scan of the abdomen and pelvis showed 2.9 x 2.7 x 2.7 echogenic mass near the liver helium, common bile duct was thickened.  MRCP showed irregular mass of the liver which enhances gradually concerning for cholangiocarcinoma.  Diffuse mild intrahepatic ductal dilation with wall thickening of the common bile duct and gallbladder wall thickening concerning for malignancy. Cytologies from biliary drain fluid on 08/17/2021 were nondiagnostic.   Assessment/Plan:   Obstructive jaundice/hyperbilirubinemia: IR was consulted and perform PTC on 08/17/2021. Status post cholangiogram via an indwelling right biliary drain and brushing. Drain was exchanged on 08/19/2021 due to persistent intrahepatic dilation path r inconclusive. LFTs are trending down. GI recommended liver biopsy, IR was consulted and they will proceed with biopsy on 08/26/2021 we will need to hold oral anticoagulation  Fevers with leukocytosis on 08/20/2021: Has remained afebrile continue IV Unasyn. It is to note that drain was exchanged on the 22nd which coincides when he started having fevers. Biliary fluid cultures grew abundant Enterococcus faecalis and Aerococcus viridans sensitive to ampicillin. We will need to be on antibiotics for at least 2 to 3 weeks.  Normocytic anemia: Hemoglobin is stable.  Acute kidney injury: Improved with fluid resuscitation and admission likely prerenal azotemia.  Hypokalemia: Replete orally now resolved.  Hypovolemic  hyponatremia: Resolved with fluid resuscitation.  Essential hypertension: Blood pressure currently stable continue current regimen.  Enlarged prostate seen on CT, Continue Fosamax.  Slight splenomegaly: Noted on CT scan.  Constipation/diverticulosis: Continue MiraLAX and stool softeners as needed.  Anxiety: Continue low-dose Xanax.  Tobacco use Noted  DVT prophylaxis: lovenox Family Communication:wife Status is: Inpatient Remains inpatient appropriate because: Painless obstructive jaundice    Code Status:     Code Status Orders  (From admission, onward)           Start     Ordered   08/14/21 0728  Full code  Continuous        08/14/21 0729           Code Status History     This patient has a current code status but no historical code status.         IV Access:   Peripheral IV   Procedures and diagnostic studies:   No results found.   Medical Consultants:   None.   Subjective:    Roemello D Jurczyk pain is controlled.  Objective:    Vitals:   08/24/21 1233 08/24/21 2042 08/24/21 2300 08/25/21 0441  BP: 136/75 (!) 146/88  131/74  Pulse: 67 73  70  Resp: 18 19  18   Temp: 98.9 F (37.2 C) 98.9 F (37.2 C)  99.4 F (37.4 C)  TempSrc: Oral   Oral  SpO2: 100% 99%  98%  Weight:   93.2 kg   Height:       SpO2: 98 % O2 Flow Rate (L/min): 2 L/min   Intake/Output Summary (Last 24 hours) at 08/25/2021 1032 Last data filed at 08/25/2021 0906 Gross per 24 hour  Intake 9081.73  ml  Output 2725 ml  Net 6356.73 ml    Filed Weights   08/13/21 1604 08/24/21 2300  Weight: 95.3 kg 93.2 kg    Exam: General exam: In no acute distress.,  Continues to be jaundiced. Respiratory system: Good air movement and clear to auscultation. Cardiovascular system: S1 & S2 heard, RRR. No JVD. Gastrointestinal system: Abdomen is nondistended, soft and nontender.  Extremities: No pedal edema. Skin: No rashes, lesions or ulcers Psychiatry: Judgement  and insight appear normal. Mood & affect appropriate.  Data Reviewed:    Labs: Basic Metabolic Panel: Recent Labs  Lab 08/20/21 0551 08/21/21 0539 08/22/21 0532 08/23/21 0517 08/24/21 0509  NA 136 135 139 138 139  K 3.8 3.2* 3.2* 3.9 3.6  CL 101 101 106 106 108  CO2 24 25 25 24 23   GLUCOSE 123* 143* 105* 112* 102*  BUN 15 17 14 16 15   CREATININE 0.83 0.83 1.08 0.93 0.90  CALCIUM 8.9 8.7* 8.9 8.7* 8.9  MG 1.7  --  2.3  --   --     GFR Estimated Creatinine Clearance: 106.6 mL/min (by C-G formula based on SCr of 0.9 mg/dL). Liver Function Tests: Recent Labs  Lab 08/20/21 0551 08/21/21 0539 08/22/21 0532 08/23/21 0517 08/24/21 0509  AST 81* 66* 69* 64* 74*  ALT 151* 124* 120* 117* 121*  ALKPHOS 195* 165* 157* 148* 152*  BILITOT 10.7* 9.6* 8.2* 6.1* 4.9*  PROT 6.6 6.2* 6.4* 6.5 6.3*  ALBUMIN 2.9* 2.8* 2.8* 2.8* 2.9*    No results for input(s): "LIPASE", "AMYLASE" in the last 168 hours. No results for input(s): "AMMONIA" in the last 168 hours. Coagulation profile Recent Labs  Lab 08/19/21 0536 08/25/21 0524  INR 0.9 1.1    COVID-19 Labs  No results for input(s): "DDIMER", "FERRITIN", "LDH", "CRP" in the last 72 hours.  No results found for: "SARSCOV2NAA"  CBC: Recent Labs  Lab 08/19/21 0536 08/20/21 0551 08/21/21 0539 08/23/21 0517 08/25/21 0524  WBC 10.1 15.3* 7.9 6.3 7.7  NEUTROABS  --  13.0* 6.5  --   --   HGB 11.4* 11.2* 10.3* 10.7* 10.8*  HCT 34.0* 33.4* 30.6* 32.0* 32.7*  MCV 94.7 94.1 95.0 94.7 94.0  PLT 311 300 263 294 319    Cardiac Enzymes: No results for input(s): "CKTOTAL", "CKMB", "CKMBINDEX", "TROPONINI" in the last 168 hours. BNP (last 3 results) No results for input(s): "PROBNP" in the last 8760 hours. CBG: No results for input(s): "GLUCAP" in the last 168 hours. D-Dimer: No results for input(s): "DDIMER" in the last 72 hours. Hgb A1c: No results for input(s): "HGBA1C" in the last 72 hours. Lipid Profile: No results for  input(s): "CHOL", "HDL", "LDLCALC", "TRIG", "CHOLHDL", "LDLDIRECT" in the last 72 hours. Thyroid function studies: No results for input(s): "TSH", "T4TOTAL", "T3FREE", "THYROIDAB" in the last 72 hours.  Invalid input(s): "FREET3" Anemia work up: No results for input(s): "VITAMINB12", "FOLATE", "FERRITIN", "TIBC", "IRON", "RETICCTPCT" in the last 72 hours. Sepsis Labs: Recent Labs  Lab 08/20/21 0551 08/21/21 0539 08/23/21 0517 08/25/21 0524  WBC 15.3* 7.9 6.3 7.7    Microbiology Recent Results (from the past 240 hour(s))  Urine Culture     Status: None   Collection Time: 08/20/21  7:29 AM   Specimen: Urine, Clean Catch  Result Value Ref Range Status   Specimen Description   Final    URINE, CLEAN CATCH Performed at [facility], [place] [address] [place], [place] [address]    Special Requests  Final    NONE Performed at [facility], [place] [address], [place], [place] [address]    Culture   Final    NO GROWTH Performed at [facility], [place] [address] [place], [place] [address]    Report Status 08/21/2021 FINAL  Final  Body fluid culture w Gram Stain     Status: None   Collection Time: 08/20/21  7:35 AM   Specimen: BILE; Body Fluid  Result Value Ref Range Status   Specimen Description   Final    BILE Performed at [place] [address], [place], [place] [address]    Special Requests   Final    Normal Performed at [facility], [place] [address], [place], [place] [address]    Gram Stain   Final    NO WBC SEEN ABUNDANT GRAM POSITIVE COCCI IN CHAINS IN CLUSTERS ABUNDANT GRAM POSITIVE RODS FEW BUDDING YEAST SEEN    Culture   Final    ABUNDANT ENTEROCOCCUS FAECALIS ABUNDANT AEROCOCCUS VIRIDANS Standardized susceptibility testing for this organism is not available. Performed at [facility], [place] [address] [place], [place] [address]    Report Status 08/23/2021 FINAL  Final    Organism ID, Bacteria ENTEROCOCCUS FAECALIS  Final      Susceptibility   Enterococcus faecalis - MIC*    AMPICILLIN <=2 SENSITIVE Sensitive     VANCOMYCIN 1 SENSITIVE Sensitive     GENTAMICIN SYNERGY SENSITIVE Sensitive     * ABUNDANT ENTEROCOCCUS FAECALIS  Culture, blood (Routine X 2) w Reflex to ID Panel     Status: None   Collection Time: 08/20/21  8:10 AM   Specimen: BLOOD  Result Value Ref Range Status   Specimen Description   Final    BLOOD LEFT ANTECUBITAL Performed at [place] [address] [place], [place] [address]    Special Requests   Final    BOTTLES DRAWN AEROBIC ONLY Blood Culture adequate volume Performed at [place] [address], [place],  [place] [address]    Culture   Final    NO GROWTH 5 DAYS Performed at [facility], [place] [address] [place], [place] [address]    Report Status 08/25/2021 FINAL  Final  Culture, blood (Routine X 2) w Reflex to ID Panel     Status: None   Collection Time: 08/20/21  8:10 AM   Specimen: BLOOD LEFT HAND  Result Value Ref Range Status   Specimen Description   Final    BLOOD LEFT HAND Performed at [place] [address] [place], [place] [address]    Special Requests   Final    BOTTLES DRAWN AEROBIC ONLY Blood Culture adequate volume Performed at [place] [address] [place], [place] [address]    Culture   Final    NO GROWTH 5 DAYS Performed at [facility], [place] [address], [place], [place] [address]    Report Status 08/25/2021 FINAL  Final  MRSA Next Gen by PCR, Nasal     Status: None   Collection Time: 08/20/21  5:39 PM   Specimen: Nasal Mucosa; Nasal Swab  Result Value Ref Range Status   MRSA by PCR Next Gen NOT DETECTED NOT DETECTED Final    Comment: (NOTE) The GeneXpert MRSA Assay (FDA approved for NASAL specimens only), is one component of a comprehensive MRSA colonization surveillance program. It is  not intended  to diagnose MRSA infection nor to guide or monitor treatment for MRSA infections. Test performance is not FDA approved in patients less than 104 years old. Performed at [facility], [place] [address], [place], [place] [address]      Medications:    amLODipine  10 mg Oral q morning   feeding supplement  237 mL Oral BID BM   fluticasone  2 spray Each Nare Daily   guaiFENesin  600 mg Oral BID   pantoprazole  40 mg Oral Daily   saccharomyces boulardii  250 mg Oral BID   sodium chloride flush  5 mL Intracatheter TID   Continuous Infusions:  sodium chloride 75 mL/hr at 08/25/21 0906   ampicillin-sulbactam (UNASYN) IV Stopped (08/25/21 0728)      LOS: 11 days   Charlynne Cousins  [facility]  08/25/2021, 10:32 AM

## 2021-08-25 NOTE — Procedures (Signed)
Interventional Radiology Procedure Note  Procedure: CT guided biopsy of liver mass, right liver Complications: None EBL: None Specimen: Mx 18g core Recommendations: - Bedrest 2 hours, ambulate per primary order  - Routine wound care - Follow up pathology - Advance diet ok, per primary order  Signed,  Corrie Mckusick, DO

## 2021-08-26 ENCOUNTER — Encounter: Payer: Self-pay | Admitting: Oncology

## 2021-08-26 ENCOUNTER — Encounter: Payer: Self-pay | Admitting: *Deleted

## 2021-08-26 ENCOUNTER — Encounter (HOSPITAL_COMMUNITY): Payer: Self-pay | Admitting: Oncology

## 2021-08-26 ENCOUNTER — Other Ambulatory Visit: Payer: Self-pay | Admitting: Oncology

## 2021-08-26 DIAGNOSIS — D649 Anemia, unspecified: Secondary | ICD-10-CM

## 2021-08-26 DIAGNOSIS — N433 Hydrocele, unspecified: Secondary | ICD-10-CM

## 2021-08-26 DIAGNOSIS — N400 Enlarged prostate without lower urinary tract symptoms: Secondary | ICD-10-CM

## 2021-08-26 DIAGNOSIS — B952 Enterococcus as the cause of diseases classified elsewhere: Secondary | ICD-10-CM

## 2021-08-26 DIAGNOSIS — K831 Obstruction of bile duct: Secondary | ICD-10-CM | POA: Diagnosis not present

## 2021-08-26 DIAGNOSIS — K769 Liver disease, unspecified: Secondary | ICD-10-CM | POA: Diagnosis not present

## 2021-08-26 DIAGNOSIS — F17210 Nicotine dependence, cigarettes, uncomplicated: Secondary | ICD-10-CM

## 2021-08-26 DIAGNOSIS — R7881 Bacteremia: Secondary | ICD-10-CM

## 2021-08-26 DIAGNOSIS — R1901 Right upper quadrant abdominal swelling, mass and lump: Secondary | ICD-10-CM

## 2021-08-26 DIAGNOSIS — C220 Liver cell carcinoma: Secondary | ICD-10-CM | POA: Diagnosis not present

## 2021-08-26 DIAGNOSIS — K760 Fatty (change of) liver, not elsewhere classified: Secondary | ICD-10-CM | POA: Diagnosis not present

## 2021-08-26 DIAGNOSIS — I10 Essential (primary) hypertension: Secondary | ICD-10-CM | POA: Diagnosis not present

## 2021-08-26 DIAGNOSIS — R17 Unspecified jaundice: Secondary | ICD-10-CM | POA: Diagnosis not present

## 2021-08-26 DIAGNOSIS — I7300 Raynaud's syndrome without gangrene: Secondary | ICD-10-CM

## 2021-08-26 LAB — SURGICAL PATHOLOGY

## 2021-08-26 MED ORDER — AMOXICILLIN-POT CLAVULANATE 875-125 MG PO TABS
1.0000 | ORAL_TABLET | Freq: Two times a day (BID) | ORAL | 0 refills | Status: AC
Start: 2021-08-26 — End: 2021-09-05

## 2021-08-26 MED ORDER — AMOXICILLIN-POT CLAVULANATE 875-125 MG PO TABS: 1.0000 | ORAL_TABLET | Freq: Two times a day (BID) | ORAL | Status: DC

## 2021-08-26 NOTE — Discharge Summary (Signed)
Physician Discharge Summary  Jeffrey Blevins:[id number] DOB: 12/28/1965 DOA: 08/13/2021  PCP: Percell Belt, DO  Admit date: 08/13/2021 Discharge date: 08/26/2021  Admitted From: Home Disposition:  Home  Recommendations for Outpatient Follow-up:  Follow up with Oncologist in 1-2 weeks Please obtain BMP/CBC in one week   Home Health:no Equipment/Devices:None  Discharge Condition:Stable CODE STATUS:Full Diet recommendation: Heart Healthy   Brief/Interim Summary: 56 y.o. 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  CT scan of the abdomen and pelvis showed 2.9 x 2.7 x 2.7 echogenic mass near the liver helium, common bile duct was thickened.  MRCP showed irregular mass of the liver which enhances gradually concerning for cholangiocarcinoma.  Diffuse mild intrahepatic ductal dilation with wall thickening of the common bile duct and gallbladder wall thickening concerning for malignancy. Cytologies from biliary drain fluid on 08/17/2021 were nondiagnostic.  Discharge Diagnoses:  Principal Problem:   Obstructive jaundice Active Problems:   Tobacco use   Hypertension   Hyponatremia   Prostate enlargement   Hepatic steatosis   GERD (gastroesophageal reflux disease)  Obstructive painless jaundice/hyperbilirubinemia: IR consulted and perform PTC on 08/17/2021. He is status post cholangiogram with indwelling right biliary drain and brushing with cytologies were inconclusive. The drain was exchanged on 08/19/2021 due to persistent intrahepatic dilation. LFTs started to trend down. GI was consulted and recommended liver biopsy which was performed on 08/26/2021. Oncology was consulted and follow-up biopsy results as an outpatient.  Fever with  leukocytosis on 08/20/2021: This was after removal and exchange of the percutaneous biliary drain he was started on IV Unasyn fluid cultures were sent and grew Enterococcus faecalis and viridans which are sensitive to ampicillin. He was transitioned to oral Augmentin which she will continue for 2 weeks.  Normocytic anemia: Hemoglobin remained stable follow-up with hematology.  Acute kidney injury: Likely prerenal azotemia resolved with fluid resuscitation.  Hypokalemia: Replete orally now resolved.  Hypovolemic hyponatremia: Resolved with fluid resuscitation.  Essential hypertension: Antihypertensive medications were held he will continue to hold them as an outpatient.  BPH: Continue Flomax.  Splenomegaly: Noted on CT follow-up with hematology.  Constipation/diverticulosis: Continue MiraLAX.  Anxiety: Continue low-dose Xanax.  Tobacco abuse: Noted.  Discharge Instructions  Discharge Instructions     Diet - low sodium heart healthy   Complete by: As directed    Increase activity slowly   Complete by: As directed    No wound care   Complete by: As directed       Allergies as of 08/26/2021       Reactions   Codeine Nausea And Vomiting, Rash        Medication List     STOP taking these medications    amLODipine 10 MG tablet Commonly known as: NORVASC   irbesartan 150 MG tablet Commonly known as: AVAPRO       TAKE these medications    acetaminophen 500 MG tablet Commonly known as: TYLENOL Take 1,000 mg by mouth 3 (three) times daily as needed for headache (pain).   amoxicillin-clavulanate 875-125 MG tablet Commonly known as: AUGMENTIN Take 1 tablet by mouth every 12 (twelve) hours for 10 days.   esomeprazole 20 MG capsule Commonly known as: NEXIUM Take 20 mg by mouth every morning.   ibuprofen 200 MG tablet  Commonly known as: ADVIL Take 400 mg by mouth 3 (three) times daily as needed for headache (pain).        Allergies  Allergen  Reactions   Codeine Nausea And Vomiting and Rash    Consultations: Interventional radiology Gastroenterology Oncology   Procedures/Studies: CT BIOPSY  Result Date: 2021/09/13 INDICATION: 56 year old male presents for biopsy of right liver mass EXAM: CT BIOPSY MEDICATIONS: None. ANESTHESIA/SEDATION: Moderate (conscious) sedation was employed during this procedure. A total of Versed 3.0 mg and Fentanyl 150 mcg was administered intravenously. Moderate Sedation Time: 21 minutes. The patient's level of consciousness and vital signs were monitored continuously by radiology nursing throughout the procedure under my direct supervision. FLUOROSCOPY TIME:  CT COMPLICATIONS: None PROCEDURE: Informed written consent was obtained from the patient after a thorough discussion of the procedural risks, benefits and alternatives. All questions were addressed. Maximal Sterile Barrier Technique was utilized including caps, mask, sterile gowns, sterile gloves, sterile drape, hand hygiene and skin antiseptic. A timeout was performed prior to the initiation of the procedure. Patient positioned supine position on the CT gantry table. Scout CT acquired for planning purposes. The patient is prepped and draped in the usual sterile fashion. 1% lidocaine was used for local anesthesia. Using CT guidance, guide needle was advanced to the hypodense lesion within the right liver. Once we confirmed needle tip position multiple 18 gauge core biopsy were performed. Gel-Foam pledgets infuse with a small amount of saline. Needle was removed. Samples were placed in the formalin. Final image was stored. Patient tolerated the procedure well and remained hemodynamically stable throughout. No complications were encountered and no significant blood loss IMPRESSION: Status post CT-guided biopsy of right-sided liver mass. Signed, Dulcy Fanny. Nadene Rubins, RPVI [facility] Radiology Electronically  Signed   By: Corrie Mckusick D.O.   On: 09-13-2021 12:56   DG CHEST PORT 1 VIEW  Result Date: 08/20/2021 CLINICAL DATA:  Encounter for fever. EXAM: PORTABLE CHEST 1 VIEW COMPARISON:  None Available. FINDINGS: Cardiac silhouette and mediastinal contours are within normal limits. The lungs are clear. No pleural effusion or pneumothorax. Moderate multilevel degenerative bridging osteophytes of the thoracic spine. IMPRESSION: No acute cardiopulmonary disease process. Electronically Signed   By: Yvonne Kendall M.D.   On: 08/20/2021 09:09   IR EXCHANGE BILIARY DRAIN  Result Date: 08/19/2021 INDICATION: 56 year old male with obstructive hilar mass presenting with cholestasis status post right percutaneous biliary drain placement on 08/16/2021 with persistent but improved hyperbilirubinemia, still lacking tissue diagnosis. EXAM: 1. Percutaneous cholangiogram via indwelling right biliary drain 2. Biliary brush biopsy 3. Biliary drain exchange MEDICATIONS: ; The antibiotic was administered within an appropriate time frame prior to the initiation of the procedure. ANESTHESIA/SEDATION: Moderate (conscious) sedation was employed during this procedure. A total of Versed 4 mg and Fentanyl 300 mcg was administered intravenously. Moderate Sedation Time: 36 minutes. The patient's level of consciousness and vital signs were monitored continuously by radiology nursing throughout the procedure under my direct supervision. FLUOROSCOPY TIME:  Fluoroscopy Time: 206 mGy CONTRAST:  Fifteen mL Omnipaque 300, administered into the biliary tree COMPLICATIONS: None immediate. PROCEDURE: Informed written consent was obtained from the patient after a thorough discussion of the procedural risks, benefits and alternatives. All questions were addressed. Maximal Sterile Barrier Technique was utilized including caps, mask, sterile gowns, sterile gloves, sterile drape, hand hygiene and skin antiseptic. A timeout was performed prior to the  initiation of the procedure. The right upper quadrant indwelling right-sided biliary drain were  prepped and draped in standard fashion. Hand injection of contrast via the indwelling biliary drain demonstrates decompression of the right-sided biliary tree and mild left intrahepatic biliary ductal dilation. The left side fills promptly upon injection. Contrast drains into the duodenum via the pigtail portion of the drain. An Amplatz wire was inserted via the indwelling drain after releasing the pigtail suture and the drain was exchanged for an 8 [place] sheath. A 0.018 inch safety wire was then placed with the tip in the duodenum. The Amplatz wire was removed. Biliary brush biopsy was performed with a total of 3 separate brush devices at the confluence of the left and right biliary tree. Samples were placed in CytoLyt and sent to Pathology. A Kumpe the catheter was then inserted over the 0.018 inch wire and directed to the level of the duodenum. The wire was exchanged for an Amplatz wire. The sheath was removed. Serial dilation was performed and a 30 [place] biliary drain was then placed. The pigtail portion was coiled in the duodenum and the distal radiopaque marker was positioned near the biliary radicle entry site in the right lobe of the liver. Hand injection of contrast demonstrated appropriate position with preferential flow through the drain into the duodenum. The drain was placed to bag drainage and a 0 silk suture was placed at the skin entry site. A sterile bandage was applied. The patient tolerated the procedure well without immediate complication and was transferred back to the floor in stable condition. IMPRESSION: 1. Cholangiogram demonstrates communication of the right left biliary tree with relative decompression of the right biliary ducts, mild dilation of the left biliary ducts. 2. Technically successful brush biopsy at the confluence of the left and right bile ducts. 3. Technically successful biliary  drain upsize from 10 [place] to 12 [place]. PLAN: Follow-up cytology/pathology results. If nondiagnostic, recommend CT-guided liver biopsy given poor sonographic visualization of the hilar mass. Ruthann Cancer, MD [facility] Radiology Electronically Signed   By: Ruthann Cancer M.D.   On: 08/19/2021 14:58   IR ENDOLUMINAL BX OF BILIARY TREE  Result Date: 08/19/2021 INDICATION: 56 year old male with obstructive hilar mass presenting with cholestasis status post right percutaneous biliary drain placement on 08/16/2021 with persistent but improved hyperbilirubinemia, still lacking tissue diagnosis. EXAM: 1. Percutaneous cholangiogram via indwelling right biliary drain 2. Biliary brush biopsy 3. Biliary drain exchange MEDICATIONS: ; The antibiotic was administered within an appropriate time frame prior to the initiation of the procedure. ANESTHESIA/SEDATION: Moderate (conscious) sedation was employed during this procedure. A total of Versed 4 mg and Fentanyl 300 mcg was administered intravenously. Moderate Sedation Time: 36 minutes. The patient's level of consciousness and vital signs were monitored continuously by radiology nursing throughout the procedure under my direct supervision. FLUOROSCOPY TIME:  Fluoroscopy Time: 206 mGy CONTRAST:  Fifteen mL Omnipaque 300, administered into the biliary tree COMPLICATIONS: None immediate. PROCEDURE: Informed written consent was obtained from the patient after a thorough discussion of the procedural risks, benefits and alternatives. All questions were addressed. Maximal Sterile Barrier Technique was utilized including caps, mask, sterile gowns, sterile gloves, sterile drape, hand hygiene and skin antiseptic. A timeout was performed prior to the initiation of the procedure. The right upper quadrant indwelling right-sided biliary drain were prepped and draped in standard fashion. Hand injection of contrast via the indwelling biliary  drain demonstrates decompression of the right-sided biliary tree and mild left intrahepatic biliary ductal dilation. The left side fills promptly upon injection. Contrast drains into the  duodenum via the pigtail portion of the drain. An Amplatz wire was inserted via the indwelling drain after releasing the pigtail suture and the drain was exchanged for an 8 [place] sheath. A 0.018 inch safety wire was then placed with the tip in the duodenum. The Amplatz wire was removed. Biliary brush biopsy was performed with a total of 3 separate brush devices at the confluence of the left and right biliary tree. Samples were placed in CytoLyt and sent to Pathology. A Kumpe the catheter was then inserted over the 0.018 inch wire and directed to the level of the duodenum. The wire was exchanged for an Amplatz wire. The sheath was removed. Serial dilation was performed and a 64 [place] biliary drain was then placed. The pigtail portion was coiled in the duodenum and the distal radiopaque marker was positioned near the biliary radicle entry site in the right lobe of the liver. Hand injection of contrast demonstrated appropriate position with preferential flow through the drain into the duodenum. The drain was placed to bag drainage and a 0 silk suture was placed at the skin entry site. A sterile bandage was applied. The patient tolerated the procedure well without immediate complication and was transferred back to the floor in stable condition. IMPRESSION: 1. Cholangiogram demonstrates communication of the right left biliary tree with relative decompression of the right biliary ducts, mild dilation of the left biliary ducts. 2. Technically successful brush biopsy at the confluence of the left and right bile ducts. 3. Technically successful biliary drain upsize from 10 [place] to 12 [place]. PLAN: Follow-up cytology/pathology results. If nondiagnostic, recommend CT-guided liver biopsy given poor sonographic visualization of the hilar  mass. Ruthann Cancer, MD [facility] Radiology Electronically Signed   By: Ruthann Cancer M.D.   On: 08/19/2021 14:58   IR INT EXT BILIARY DRAIN WITH CHOLANGIOGRAM  Result Date: 08/16/2021 INDICATION: 56 year old with obstructive jaundice likely secondary to malignant neoplasm. Patient is being evaluated for percutaneous biliary drainage and percutaneous biopsy. EXAM: 1. Percutaneous transhepatic cholangiogram with ultrasound and fluoroscopic guidance 2. Placement of internal/external biliary drain with ultrasound and fluoroscopic guidance MEDICATIONS: Rocephin 2 g; The antibiotic was administered within an appropriate time frame prior to the initiation of the procedure. ANESTHESIA/SEDATION: Moderate (conscious) sedation was employed during this procedure. A total of Versed 6.0 mg and Fentanyl 250 mcg was administered intravenously by the radiology nurse. Total intra-service moderate Sedation Time: 54 minutes. The patient's level of consciousness and vital signs were monitored continuously by radiology nursing throughout the procedure under my direct supervision. FLUOROSCOPY: Radiation Exposure Index (as provided by the fluoroscopic device): 564 mGy Kerma COMPLICATIONS: None immediate. PROCEDURE: Informed written consent was obtained from the patient after a thorough discussion of the procedural risks, benefits and alternatives. All questions were addressed. Maximal Sterile Barrier Technique was utilized including caps, mask, sterile gowns, sterile gloves, sterile drape, hand hygiene and skin antiseptic. A timeout was performed prior to the initiation of the procedure. Patient was placed supine on the interventional table. The anterior and right side of the abdomen was prepped and draped in sterile fashion. Ultrasound was used to identify dilated ducts in the left hepatic lobe. The anterior abdomen was anesthetized with 1% lidocaine. Using ultrasound guidance, a 21  gauge needle was directed into a dilated peripheral duct and percutaneous transhepatic cholangiogram was performed. Multiple attempts were made to advance a wire through this access but this was not successful due to tortuosity of the bile duct. Therefore, attention  was directed to the right hepatic lobe. A dilated peripheral right hepatic duct was identified with ultrasound and targeted. The right side of the abdomen was anesthetized with 1% lidocaine. Using ultrasound guidance, 21 gauge needle was directed into the dilated peripheral duct and contrast injection confirmed placement in the biliary system. A 0.018 wire was advanced into the central hepatic ducts and a transitional dilator set was successfully placed. Additional cholangiogram images were obtained. Eventually this access was upsized to a 5 [place] Kumpe catheter over a J wire. Initially, the common hepatic duct was not identified but eventually a small amount of contrast was noted within the common hepatic duct and this was successfully cannulated with a Roadrunner wire. The Kumpe catheter was eventually advanced into the duodenum using a Bentson wire. The tract was dilated and a 10 [place] biliary drain was advanced over the wire. The distal aspect of the drain was placed in the duodenum. Contrast injection confirmed appropriate placement in the biliary system. A sample of bile was collected for cytology. Drain was flushed with saline and attached to a gravity bag. Bandage was placed. FINDINGS: Bilateral intrahepatic biliary dilatation identified with ultrasound. The lesion seen on CT and MRI is inconspicuous with ultrasound. Left hepatic duct was initially punctured using ultrasound guidance and cholangiogram demonstrated filling of dilated left hepatic ducts and at least 1 right hepatic duct. Second percutaneous transhepatic cholangiogram demonstrated filling of a dilated right hepatic duct and additional dilated intrahepatic ducts were identified.  Eventually, the common hepatic duct and the common bile duct were identified. No significant dilatation of the extrahepatic biliary system. The area of obstruction is at the biliary confluence. A drainage catheter was successfully advanced to the duodenum. Final cholangiogram images demonstrate that majority of the intrahepatic bile ducts were opacified. There is a small segment of the right hepatic lobe which is not opacifying and likely obstructed from neoplasm and this finding was confirmed with ultrasound. IMPRESSION: 1. Successful percutaneous transhepatic cholangiogram and placement of an internal/external biliary drain via a right hepatic duct. Drain was successfully advanced into the duodenum. The obstruction is at the biliary confluence. 2. Hepatic lesion seen on recent CT and MRI is poorly visualized with ultrasound. Small segment of the right hepatic lobe appears to have persistent biliary dilatation after drain placement and suspect that the mass lesion is in this area. 3. Fluid was sent for cytology. Depending on the results of the cytology, we may need to re-assess for image guided percutaneous biopsy of the liver lesion versus brush biopsy at the biliary confluence. Electronically Signed   By: Markus Daft M.D.   On: 08/16/2021 17:30   MR ABDOMEN MRCP W WO CONTAST  Result Date: 08/14/2021 CLINICAL DATA:  Jaundice EXAM: MRI ABDOMEN WITHOUT AND WITH CONTRAST (INCLUDING MRCP) TECHNIQUE: Multiplanar multisequence MR imaging of the abdomen was performed both before and after the administration of intravenous contrast. Heavily T2-weighted images of the biliary and pancreatic ducts were obtained, and three-dimensional MRCP images were rendered by post processing. CONTRAST:  41m GADAVIST GADOBUTROL 1 MMOL/ML IV SOLN COMPARISON:  CT abdomen and pelvis dated August 13, 2021 FINDINGS: Lower chest: No acute findings. Hepatobiliary: Irregular mass of segment 8 liver measuring 2.9 x 2.8 cm on series 3, image 14  with intermediate T2 signal, low T1 signal and perilesional enhancement that gradually fills in on more delayed imaging. Diffuse mild intrahepatic biliary ductal dilation with more focal intrahepatic ductal dilation seen distal to the mass. Normal caliber common bile duct with  wall thickening. No choledocholithiasis. Gallbladder is decompressed. Pancreas: No mass, inflammatory changes, or other parenchymal abnormality identified. Spleen:  Within normal limits in size and appearance. Adrenals/Urinary Tract: Bilateral adrenal glands are unremarkable. 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 Stomach/Bowel: Visualized portions within the abdomen are unremarkable. Vascular/Lymphatic: No pathologically enlarged lymph nodes identified. No abdominal aortic aneurysm demonstrated. Other:  None. Musculoskeletal: No suspicious bone lesions identified. IMPRESSION: 1. Irregular mass of segment 8 of the liver with perilesional enhancement that gradually fills in on more delayed imaging, concerning for cholangiocarcinoma. 2. Diffuse mild intrahepatic biliary ductal dilation with wall thickening of the common bile duct. Findings are concerning for tumor extension along the common bile duct. 3. Gallbladder is thick-walled and decompressed, concerning for additional site of tumor extension. Electronically Signed   By: Yetta Glassman M.D.   On: 08/14/2021 12:22   MR 3D Recon At Scanner  Result Date: 08/14/2021 CLINICAL DATA:  Jaundice EXAM: MRI ABDOMEN WITHOUT AND WITH CONTRAST (INCLUDING MRCP) TECHNIQUE: Multiplanar multisequence MR imaging of the abdomen was performed both before and after the administration of intravenous contrast. Heavily T2-weighted images of the biliary and pancreatic ducts were obtained, and three-dimensional MRCP images were rendered by post processing. CONTRAST:  32m GADAVIST GADOBUTROL 1 MMOL/ML IV SOLN COMPARISON:  CT abdomen and pelvis dated August 13, 2021 FINDINGS: Lower  chest: No acute findings. Hepatobiliary: Irregular mass of segment 8 liver measuring 2.9 x 2.8 cm on series 3, image 14 with intermediate T2 signal, low T1 signal and perilesional enhancement that gradually fills in on more delayed imaging. Diffuse mild intrahepatic biliary ductal dilation with more focal intrahepatic ductal dilation seen distal to the mass. Normal caliber common bile duct with wall thickening. No choledocholithiasis. Gallbladder is decompressed. Pancreas: No mass, inflammatory changes, or other parenchymal abnormality identified. Spleen:  Within normal limits in size and appearance. Adrenals/Urinary Tract: Bilateral adrenal glands are unremarkable. 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 Stomach/Bowel: Visualized portions within the abdomen are unremarkable. Vascular/Lymphatic: No pathologically enlarged lymph nodes identified. No abdominal aortic aneurysm demonstrated. Other:  None. Musculoskeletal: No suspicious bone lesions identified. IMPRESSION: 1. Irregular mass of segment 8 of the liver with perilesional enhancement that gradually fills in on more delayed imaging, concerning for cholangiocarcinoma. 2. Diffuse mild intrahepatic biliary ductal dilation with wall thickening of the common bile duct. Findings are concerning for tumor extension along the common bile duct. 3. Gallbladder is thick-walled and decompressed, concerning for additional site of tumor extension. Electronically Signed   By: LYetta GlassmanM.D.   On: 08/14/2021 12:22   CT ABDOMEN PELVIS W CONTRAST  Result Date: 08/13/2021 CLINICAL DATA:  Abdominal pain and jaundice. EXAM: CT ABDOMEN AND PELVIS WITH CONTRAST TECHNIQUE: Multidetector CT imaging of the abdomen and pelvis was performed using the standard protocol following bolus administration of intravenous contrast. RADIATION DOSE REDUCTION: This exam was performed according to the departmental dose-optimization program which includes automated  exposure control, adjustment of the mA and/or kV according to patient size and/or use of iterative reconstruction technique. CONTRAST:  1082mOMNIPAQUE IOHEXOL 300 MG/ML  SOLN COMPARISON:  None Available. FINDINGS: Lower chest: No acute abnormality. The cardiac size is normal. Small hiatal hernia. Hepatobiliary: Liver is 18 cm length slightly steatotic. In hepatic segment 5 near the liver hilum there is a heterogeneous masslike area of hypoenhancement measuring 2.9 x 2.7 cm on series 2 axial image 19 and 2.7 cm height on coronal reconstruction image 67. There is encasement  and apparent complete effacement of the anterior division of the right portal vein associated with this, intrahepatic biliary dilatation above this in segment 8, and intrahepatic biliary dilatation in both segments of the left lobe. The hepatic ductal confluence is just below this and is not well seen but is suspected to be obstructed for the most part. There is thickening of the wall in the common hepatic duct and probably also in the cystic duct with gallbladder mostly contracted but potentially thickened and with pericholecystic fluid also seen. No calcified stone is evident. The common bile duct is normal caliber below this level and it is unremarkable and not thickened. Primary bile duct neoplasm and primary hepatic neoplasm with secondary biliary obstruction are the most likely possibilities. An inflammatory/infectious process with phlegmon causing a masslike abnormality is possible but this should be considered a neoplasm until proven otherwise. No other focal liver abnormality is seen. The main portal vein and other portal venous branches opacify well with prominent main portal vein measuring 15.4 mm. Pancreas: No focal abnormality. No ductal dilatation or inflammatory change. Spleen: Mildly prominent, 13.1 cm AP, no mass enhancement. Adrenals/Urinary Tract: There is no adrenal mass. 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 There is no renal mass. No urinary stone or obstruction is evident. There is mild general thickening of the bladder without inflammatory change. Stomach/Bowel: Small hiatal hernia. No bowel dilatation or wall thickening including the appendix. Moderate fecal stasis particularly in the ascending colon is seen, scattered diverticula left-sided colon without diverticulitis. Vascular/Lymphatic: There is mild aortic atherosclerosis without AAA. As above the anterior division of the right portal vein does not opacify and suspected encased and effaced. There is a normal IVC and deep pelvic veins, normal SMV and splenic vein enhancement. There are scattered periportal and portacaval retroperitoneal lymph nodes up to 7 mm in short axis but no area lymph nodes which exceed pathologic size criteria. There is no gastrohepatic ligament or other adenopathy , no mesenteric or pelvic mass is seen. Reproductive: Enlarged prostate, 5.2 cm transverse with bladder base impression. Multiple phleboliths both pelvic sidewalls. Other: Small umbilical and inguinal fat hernias. No incarcerated hernia. No free air, hemorrhage or ascites. Musculoskeletal: Advanced degenerative disc disease and grade 1 retrolisthesis and spondylosis L5-S1 with acquired spinal stenosis, acquired moderate foraminal stenosis. Schmorl's nodes lower thoracic spine. No destructive bone lesion is seen. IMPRESSION: 1. 2.9 x 2.7 x 2.7 cm hypoenhancing mass in segment 5 near the liver hilum just above the hepatic ductal confluence, suspected either primary bile duct malignancy or primary hepatic malignancy with secondary biliary obstruction. 2. The common hepatic duct is thickened and there is intrahepatic biliary dilatation throughout the left lobe and above the mass in segment 8. 3. Possible an inflammatory/infectious process could produce these findings but this should be considered neoplasm until proven otherwise. 4. There appears to be additional  thickening of the cystic duct and there is contraction and possible thickening of the gallbladder with pericholecystic fluid. Coexisting cholecystitis is not excluded. No calcified stone or common bile duct dilatation is seen. 5. No appreciable opacification in the anterior branch of the right portal vein which is suspected encased and effaced by the process. There is mild prominence of the main portal vein with remainder of the portal vein branches opacifying well. 6. Subcentimeter periportal and portacaval space lymph nodes up to 7 mm in short axis but no lymph nodes exceeding the pathologic size criteria. 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 8. Slight splenomegaly, slight hepatic steatosis. 9. Constipation and diverticulosis. 10. Enlarged prostate impressing on the posterior bladder with mild bladder thickening most likely due to hypertrophy. 11. Umbilical and inguinal fat hernias. Additional findings described above. Electronically Signed   By: Telford Nab M.D.   On: 08/13/2021 20:41   (Echo, Carotid, EGD, Colonoscopy, ERCP)    Subjective: No complaints feels great.  Discharge Exam: Vitals:   08/25/21 1951 08/26/21 0455  BP: 134/85 139/84  Pulse: 62 66  Resp: 17 17  Temp: 98.8 F (37.1 C) 99.4 F (37.4 C)  SpO2: 100% 99%   Vitals:   08/25/21 1705 08/25/21 1737 08/25/21 1951 08/26/21 0455  BP: 136/83 (!) 147/72 134/85 139/84  Pulse: 68 64 62 66  Resp: _0 Temp: 99.3 F (37.4 C) 99.5 F (37.5 C) 98.8 F (37.1 C) 99.4 F (37.4 C)  TempSrc: Oral Oral    SpO2: 100% 99% 100% 99%  Weight:      Height:        General: Pt is alert, awake, not in acute distress Cardiovascular: RRR, S1/S2 +, no rubs, no gallops Respiratory: CTA bilaterally, no wheezing, no rhonchi Abdominal: Soft, NT, ND, bowel sounds + Extremities: no edema, no cyanosis    The results of significant diagnostics from this hospitalization (including imaging,  microbiology, ancillary and laboratory) are listed below for reference.     Microbiology: Recent Results (from the past 240 hour(s))  Urine Culture     Status: None   Collection Time: 08/20/21  7:29 AM   Specimen: Urine, Clean Catch  Result Value Ref Range Status   Specimen Description   Final    URINE, CLEAN CATCH Performed at [facility], [place] [address], [place], [place] [address]    Special Requests   Final    NONE Performed at [facility], [place] [address], [place], [place] [address]    Culture   Final    NO GROWTH Performed at [facility], [place] [address] [place], [place] [address]    Report Status 08/21/2021 FINAL  Final  Body fluid culture w Gram Stain     Status: None   Collection Time: 08/20/21  7:35 AM   Specimen: BILE; Body Fluid  Result Value Ref Range Status   Specimen Description   Final    BILE Performed at [place] [address], [place], [place] [address]    Special Requests   Final    Normal Performed at [facility], [place] [address], [place], [place] [address]    Gram Stain   Final    NO WBC SEEN ABUNDANT GRAM POSITIVE COCCI IN CHAINS IN CLUSTERS ABUNDANT GRAM POSITIVE RODS FEW BUDDING YEAST SEEN    Culture   Final    ABUNDANT ENTEROCOCCUS FAECALIS ABUNDANT AEROCOCCUS VIRIDANS Standardized susceptibility testing for this organism is not available. Performed at [facility], [place] [address], [place],  [address]    Report Status 08/23/2021 FINAL  Final   Organism ID, Bacteria ENTEROCOCCUS FAECALIS  Final      Susceptibility   Enterococcus faecalis - MIC*    AMPICILLIN <=2 SENSITIVE Sensitive     VANCOMYCIN 1 SENSITIVE Sensitive     GENTAMICIN SYNERGY SENSITIVE Sensitive     * ABUNDANT ENTEROCOCCUS FAECALIS  Culture, blood (Routine X 2) w Reflex to ID Panel     Status: None   Collection Time: 08/20/21  8:10 AM   Specimen: BLOOD  Result Value Ref  Range Status   Specimen Description   Final    BLOOD LEFT ANTECUBITAL Performed at [place] [address] [place], [place] [address]    Special Requests   Final    BOTTLES DRAWN AEROBIC ONLY Blood Culture adequate volume Performed at [place] [address], [place], [place] [address]    Culture   Final    NO GROWTH 5 DAYS Performed at [facility], [place] [address], [place], [place] [address]    Report Status 08/25/2021 FINAL  Final  Culture, blood (Routine X 2) w Reflex to ID Panel     Status: None   Collection Time: 08/20/21  8:10 AM   Specimen: BLOOD LEFT HAND  Result Value Ref Range Status   Specimen Description   Final    BLOOD LEFT HAND Performed at [place] [address] [place], [place] [address]    Special Requests   Final    BOTTLES DRAWN AEROBIC ONLY Blood Culture adequate volume Performed at [place] [address] [place], [place] [address]    Culture   Final    NO GROWTH 5 DAYS Performed at [facility], [place] [address], [place], [place] [address]    Report Status 08/25/2021 FINAL  Final  MRSA Next Gen by PCR, Nasal     Status: None   Collection Time: 08/20/21  5:39 PM   Specimen: Nasal Mucosa; Nasal Swab  Result Value Ref Range Status   MRSA by PCR Next Gen NOT DETECTED NOT DETECTED Final    Comment: (NOTE) The GeneXpert MRSA Assay (FDA approved for NASAL specimens only), is one component of a comprehensive MRSA colonization surveillance program. It is not intended to diagnose MRSA infection nor to guide or monitor treatment for MRSA infections. Test performance is not FDA approved in patients less than 76 years old. Performed at [facility], [place] [address] [place], [place] [address]      Labs: BNP (last 3 results) No results for input(s): "BNP" in the last 8760 hours. Basic Metabolic Panel: Recent Labs  Lab  08/20/21 0551 08/21/21 0539 08/22/21 0532 08/23/21 0517 08/24/21 0509  NA 136 135 139 138 139  K 3.8 3.2* 3.2* 3.9 3.6  CL 101 101 106 106 108  CO2 _0 GLUCOSE 123* 143* 105* 112* 102*  BUN _1 CREATININE 0.83 0.83 1.08 0.93 0.90  CALCIUM 8.9 8.7* 8.9 8.7* 8.9  MG 1.7  --  2.3  --   --    Liver Function Tests: Recent Labs  Lab 08/20/21 0551 08/21/21 0539 08/22/21 0532 08/23/21 0517 08/24/21 0509  AST 81* 66* 69* 64* 74*  ALT 151* 124* 120* 117* 121*  ALKPHOS 195* 165* 157* 148* 152*  BILITOT 10.7* 9.6* 8.2* 6.1* 4.9*  PROT 6.6 6.2* 6.4* 6.5 6.3*  ALBUMIN 2.9* 2.8* 2.8* 2.8* 2.9*   No results for input(s): "LIPASE", "AMYLASE" in the last 168 hours. No results for input(s): "AMMONIA" in the last 168 hours. CBC: Recent Labs  Lab 08/20/21 0551 08/21/21 0539 08/23/21 0517 08/25/21 0524  WBC 15.3* 7.9 6.3 7.7  NEUTROABS 13.0* 6.5  --   --   HGB 11.2* 10.3* 10.7* 10.8*  HCT 33.4* 30.6* 32.0* 32.7*  MCV 94.1 95.0 94.7 94.0  PLT 300 263 294 319   Cardiac Enzymes:  No results for input(s): "CKTOTAL", "CKMB", "CKMBINDEX", "TROPONINI" in the last 168 hours. BNP: Invalid input(s): "POCBNP" CBG: No results for input(s): "GLUCAP" in the last 168 hours. D-Dimer No results for input(s): "DDIMER" in the last 72 hours. Hgb A1c No results for input(s): "HGBA1C" in the last 72 hours. Lipid Profile No results for input(s): "CHOL", "HDL", "LDLCALC", "TRIG", "CHOLHDL", "LDLDIRECT" in the last 72 hours. Thyroid function studies No results for input(s): "TSH", "T4TOTAL", "T3FREE", "THYROIDAB" in the last 72 hours.  Invalid input(s): "FREET3" Anemia work up No results for input(s): "VITAMINB12", "FOLATE", "FERRITIN", "TIBC", "IRON", "RETICCTPCT" in the last 72 hours. Urinalysis    Component Value Date/Time   COLORURINE AMBER (A) 08/20/2021 0729   APPEARANCEUR CLEAR 08/20/2021 0729   LABSPEC 1.021 08/20/2021 0729   PHURINE 5.0 08/20/2021 0729    GLUCOSEU NEGATIVE 08/20/2021 0729   HGBUR SMALL (A) 08/20/2021 0729   BILIRUBINUR SMALL (A) 08/20/2021 0729   KETONESUR NEGATIVE 08/20/2021 0729   PROTEINUR 30 (A) 08/20/2021 0729   NITRITE NEGATIVE 08/20/2021 0729   LEUKOCYTESUR NEGATIVE 08/20/2021 0729   Sepsis Labs Recent Labs  Lab 08/20/21 0551 08/21/21 0539 08/23/21 0517 08/25/21 0524  WBC 15.3* 7.9 6.3 7.7   Microbiology Recent Results (from the past 240 hour(s))  Urine Culture     Status: None   Collection Time: 08/20/21  7:29 AM   Specimen: Urine, Clean Catch  Result Value Ref Range Status   Specimen Description   Final    URINE, CLEAN CATCH Performed at [facility], [place] [address] [place], [place] [address]    Special Requests   Final    NONE Performed at [facility], [place] [address], [place], [place] [address]    Culture   Final    NO GROWTH Performed at [facility], [place] [address] [place], [place] [address]    Report Status 08/21/2021 FINAL  Final  Body fluid culture w Gram Stain     Status: None   Collection Time: 08/20/21  7:35 AM   Specimen: BILE; Body Fluid  Result Value Ref Range Status   Specimen Description   Final    BILE Performed at [place] [address] [place], [place] [address]    Special Requests   Final    Normal Performed at [facility], [place] [address], [place], [place] [address]    Gram Stain   Final    NO WBC SEEN ABUNDANT GRAM POSITIVE COCCI IN CHAINS IN CLUSTERS ABUNDANT GRAM POSITIVE RODS FEW BUDDING YEAST SEEN    Culture   Final    ABUNDANT ENTEROCOCCUS FAECALIS ABUNDANT AEROCOCCUS VIRIDANS Standardized susceptibility testing for this organism is not available. Performed at [facility], [place] [address], [place], [place] [address]    Report Status 08/23/2021 FINAL  Final   Organism ID, Bacteria ENTEROCOCCUS FAECALIS  Final      Susceptibility   Enterococcus faecalis - MIC*     AMPICILLIN <=2 SENSITIVE Sensitive     VANCOMYCIN 1 SENSITIVE Sensitive     GENTAMICIN SYNERGY SENSITIVE Sensitive     * ABUNDANT ENTEROCOCCUS FAECALIS  Culture, blood (Routine X 2) w Reflex to ID Panel     Status: None   Collection Time: 08/20/21  8:10 AM   Specimen: BLOOD  Result Value Ref Range Status   Specimen Description   Final    BLOOD LEFT ANTECUBITAL Performed at [place]  [place], [place] [address]    Special Requests   Final    BOTTLES DRAWN AEROBIC ONLY Blood Culture adequate volume Performed at [place] [address], [place], [place] [address]    Culture   Final    NO GROWTH 5 DAYS Performed at [facility], [place] [address] [place], [place] [address]    Report Status 08/25/2021 FINAL  Final  Culture, blood (Routine X 2) w Reflex to ID Panel     Status: None   Collection Time: 08/20/21  8:10 AM   Specimen: BLOOD LEFT HAND  Result Value Ref Range Status   Specimen Description   Final    BLOOD LEFT HAND Performed at [place] [address], [place], [place] [address]    Special Requests   Final    BOTTLES DRAWN AEROBIC ONLY Blood Culture adequate volume Performed at [place] [address] [place], [place] [address]    Culture   Final    NO GROWTH 5 DAYS Performed at [facility], [place] [address] [place], [place] [address]    Report Status 08/25/2021 FINAL  Final  MRSA Next Gen by PCR, Nasal     Status: None   Collection Time: 08/20/21  5:39 PM   Specimen: Nasal Mucosa; Nasal Swab  Result Value Ref Range Status   MRSA by PCR Next Gen NOT DETECTED NOT DETECTED Final    Comment: (NOTE) The GeneXpert MRSA Assay (FDA approved for NASAL specimens only), is one component of a comprehensive MRSA colonization surveillance program. It is not intended to diagnose MRSA infection nor to guide or monitor treatment for MRSA infections. Test  performance is not FDA approved in patients less than 81 years old. Performed at [facility], [place] [address] [place], [place] [address]        SIGNED:   Charlynne Cousins, MD  [facility] 08/26/2021, 9:43 AM Pager   If 7PM-7AM, please contact night-coverage [email or web address] Password [email or web address]

## 2021-08-26 NOTE — Consult Note (Addendum)
Jeffrey Blevins  [phone] [phone]   [place]  Referral MD: Dr. 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  He came to the emergency department with complaints of jaundice.  Initial lab work showed a sodium of 131, creatinine 1.29, AST 115, ALT 268, alk phos 283, T. bili 9.8.  CT abdomen/pelvis with contrast was performed which showed a 2.9 x 2.7 x 2.7 cm hypoenhancing mass in segment 5 near the liver hilum just above the hepatic ductal confluence suspicious for either primary bile duct malignancy or primary hepatic malignancy with secondary biliary obstruction.  The common hepatic duct is thickened and there is intrahepatic biliary dilatation throughout the left lobe and above the mass in segment 8.  An MRCP was performed which showed an irregular mass of segment 8 of the liver with perilesional enhancement that gradually fills in on more delayed imaging concerning for cholangiocarcinoma.  He was also found to have diffuse mild intrahepatic biliary ductal dilatation with wall thickening of the common bile duct and findings are concerning for tumor extension along the common bile duct.  He had a biliary drain placed on 08/16/2021 and brushings were sent for cytology which showed atypical cells.  He underwent biliary drain exchange on 08/19/2021 and biliary brushing was suspicious for malignancy. He underwent CT guided biopsy of his liver mass on 08/25/2021 and surgical pathology is still pending.  He had an AFP performed on 08/16/2021 which was normal at 6.4 and a CA 19.9 which was performed on 08/14/2021 which was elevated at 76.  The patient was seen in his hospital room.  He is  pending discharge at the time my visit.  The patient reports that he is feeling better overall.  He was experiencing loss of appetite but did not notice any significant weight loss recently.  He has not been having any headaches, dizziness, chest pain, shortness of breath, abdominal pain, nausea, vomiting.  He did notice that his stools were light in color.  His urine was very dark.  He attributed the dark urine to dehydration.  However, this not improved with hydration.  The patient is married.  He drinks 2-3 beers every other day and up to a 12 pack on the weekend.  Smokes cigarettes on occasion.  Family history significant for a mother and sister with breast cancer. Medical oncology was asked to see the patient make recommendations regarding his liver mass.  Past Medical History:  Diagnosis Date   GERD (gastroesophageal reflux disease)    Hiatal hernia    History of Barrett's esophagus    per pt dx yrs ago, but with last egd none noted 05/ 2021   History of esophageal dilatation    per pt hx several times last one approx. 2009 then 05/ 2021  for stricture   Hypertension    followed by pcp   Raynauds phenomenon    Right hydrocele   :   Past Surgical History:  Procedure Laterality Date   COLONOSCOPY WITH ESOPHAGOGASTRODUODENOSCOPY (EGD)  07/16/2019   last one   INGUINAL HERNIA REPAIR Bilateral    last one 1990s   IR ENDOLUMINAL BX OF BILIARY TREE  08/19/2021   IR EXCHANGE BILIARY DRAIN  08/19/2021   IR INT EXT BILIARY DRAIN WITH CHOLANGIOGRAM  08/16/2021   SPERMATOCELECTOMY Right 10/09/2020   Procedure: SPERMATOCELECTOMY;  Surgeon: Festus Aloe, MD;  Location: [facility];  Service: Urology;  Laterality: Right;  :   Current Facility-Administered Medications  Medication Dose Route Frequency Provider Last Rate Last Admin   0.9 %  sodium chloride infusion   Intravenous Continuous Charlynne Cousins, MD 75 mL/hr at 08/26/21 0548 New Bag at 08/26/21 0548   ALPRAZolam  (XANAX) tablet 0.25 mg  0.25 mg Oral BID PRN Regalado, Belkys A, MD   0.25 mg at 08/24/21 2340   alum & mag hydroxide-simeth (MAALOX/MYLANTA) 200-200-20 MG/5ML suspension 30 mL  30 mL Oral Q4H PRN Charlynne Cousins, MD   30 mL at 08/24/21 2125   amLODipine (NORVASC) tablet 10 mg  10 mg Oral q morning Regalado, Belkys A, MD   10 mg at 08/24/21 0955   Ampicillin-Sulbactam (UNASYN) 3 g in sodium chloride 0.9 % 100 mL IVPB  3 g Intravenous Q6H Charlynne Cousins, MD 200 mL/hr at 08/26/21 0558 3 g at 08/26/21 0558   diphenhydrAMINE (BENADRYL) capsule 25 mg  25 mg Oral Q6H PRN Reubin Milan, MD   25 mg at 08/22/21 2142   feeding supplement (ENSURE ENLIVE / ENSURE PLUS) liquid 237 mL  237 mL Oral BID BM Florencia Reasons, MD   237 mL at 08/23/21 1338   fluticasone (FLONASE) 50 MCG/ACT nasal spray 2 spray  2 spray Each Nare Daily Florencia Reasons, MD   2 spray at 08/25/21 0958   guaiFENesin (MUCINEX) 12 hr tablet 600 mg  600 mg Oral BID Florencia Reasons, MD   600 mg at 08/25/21 2220   HYDROmorphone (DILAUDID) injection 0.5 mg  0.5 mg Intravenous Q3H PRN Regalado, Belkys A, MD   0.5 mg at 08/25/21 2350   ondansetron (ZOFRAN) tablet 4 mg  4 mg Oral Q6H PRN Reubin Milan, MD       Or   ondansetron [facility]) injection 4 mg  4 mg Intravenous Q6H PRN Reubin Milan, MD   4 mg at 08/19/21 1113   oxyCODONE (Oxy IR/ROXICODONE) immediate release tablet 5 mg  5 mg Oral Q6H PRN Regalado, Belkys A, MD   5 mg at 08/22/21 1611   pantoprazole (PROTONIX) EC tablet 40 mg  40 mg Oral Daily Florencia Reasons, MD   40 mg at 08/24/21 5643   saccharomyces boulardii (FLORASTOR) capsule 250 mg  250 mg Oral BID Florencia Reasons, MD   250 mg at 08/25/21 2220   sodium chloride flush (NS) 0.9 % injection 5 mL  5 mL Intracatheter TID Han, Aimee H, PA-C   5 mL at 08/25/21 2220      Allergies  Allergen Reactions   Codeine Nausea And Vomiting and Rash  :   Family History  Problem Relation Age of Onset   Breast cancer Mother    Stroke Father     Breast cancer Sister    Cardiomyopathy Brother        Cardiomegaly  :   Social History   Socioeconomic History   Marital status: Married    Spouse name: Not on file   Number of children: Not on file   Years of education: Not on file   Highest education level: Not on file  Occupational History   Not on file  Tobacco Use   Smoking status: Some Days    Years: 5.00    Types: Cigarettes   Smokeless tobacco: Never   Tobacco comments:  10-06-2020  per pt 1pp 7days  Vaping Use   Vaping Use: Never used  Substance and Sexual Activity   Alcohol use: Yes    Alcohol/week: 4.0 standard drinks of alcohol    Types: 4 Cans of beer per week   Drug use: Never   Sexual activity: Not on file  Other Topics Concern   Not on file  Social History Narrative   Not on file   Social Determinants of Health   Financial Resource Strain: Not on file  Food Insecurity: Not on file  Transportation Needs: Not on file  Physical Activity: Not on file  Stress: Not on file  Social Connections: Not on file  Intimate Partner Violence: Not on file  :  Review of Systems: A comprehensive 14 point review of systems was negative except as noted in the HPI.  Exam: Patient Vitals for the past 24 hrs:  BP Temp Temp src Pulse Resp SpO2  08/26/21 0455 139/84 99.4 F (37.4 C) -- 66 17 99 %  08/25/21 1951 134/85 98.8 F (37.1 C) -- 62 17 100 %  08/25/21 1737 (!) 147/72 99.5 F (37.5 C) Oral 64 18 99 %  08/25/21 1705 136/83 99.3 F (37.4 C) Oral 68 17 100 %  08/25/21 1628 (!) 162/86 99 F (37.2 C) Oral 63 18 99 %  08/25/21 1612 136/82 99 F (37.2 C) Oral 67 18 100 %  08/25/21 1525 (!) 150/81 98.6 F (37 C) Oral 64 16 100 %  08/25/21 1504 (!) 149/87 98.8 F (37.1 C) Oral 63 18 100 %  08/25/21 1255 (!) 143/76 98 F (36.7 C) Oral 60 18 98 %  08/25/21 1155 129/88 -- -- 66 17 97 %  08/25/21 1150 (!) 140/91 -- -- 73 19 98 %  08/25/21 1145 (!) 145/84 -- -- 70 15 100 %  08/25/21 1140 (!) 137/91 -- -- 67  18 100 %    General:  well-nourished in no acute distress.   Eyes: Mild scleral icterus.   ENT:  There were no oropharyngeal lesions.   Lymphatics:  Negative cervical, supraclavicular or axillary adenopathy.   Respiratory: lungs were clear bilaterally without wheezing or crackles.   Cardiovascular:  Regular rate and rhythm, S1/S2, without murmur, rub or gallop.  There was no pedal edema.   GI:  abdomen was soft, flat, nontender, nondistended, without organomegaly.  Percutaneous biliary drain in place Skin exam was without echymosis, petichae.   Neuro exam was nonfocal. Patient was alert and oriented.  Attention was good.   Language was appropriate.  Mood was normal without depression.  Speech was not pressured.  Thought content was not tangential.     Lab Results  Component Value Date   WBC 7.7 08/25/2021   HGB 10.8 (L) 08/25/2021   HCT 32.7 (L) 08/25/2021   PLT 319 08/25/2021   GLUCOSE 102 (H) 08/24/2021   ALT 121 (H) 08/24/2021   AST 74 (H) 08/24/2021   NA 139 08/24/2021   K 3.6 08/24/2021   CL 108 08/24/2021   CREATININE 0.90 08/24/2021   BUN 15 08/24/2021   CO2 23 08/24/2021    CT BIOPSY  Result Date: 08/25/2021 INDICATION: 56 year old male presents for biopsy of right liver mass EXAM: CT BIOPSY MEDICATIONS: None. ANESTHESIA/SEDATION: Moderate (conscious) sedation was employed during this procedure. A total of Versed 3.0 mg and Fentanyl 150 mcg was administered intravenously. Moderate Sedation Time: 21 minutes. The patient's level of consciousness and vital signs were monitored continuously by radiology nursing  throughout the procedure under my direct supervision. FLUOROSCOPY TIME:  CT COMPLICATIONS: None PROCEDURE: Informed written consent was obtained from the patient after a thorough discussion of the procedural risks, benefits and alternatives. All questions were addressed. Maximal Sterile Barrier Technique was utilized including caps, mask, sterile gowns, sterile gloves,  sterile drape, hand hygiene and skin antiseptic. A timeout was performed prior to the initiation of the procedure. Patient positioned supine position on the CT gantry table. Scout CT acquired for planning purposes. The patient is prepped and draped in the usual sterile fashion. 1% lidocaine was used for local anesthesia. Using CT guidance, guide needle was advanced to the hypodense lesion within the right liver. Once we confirmed needle tip position multiple 18 gauge core biopsy were performed. Gel-Foam pledgets infuse with a small amount of saline. Needle was removed. Samples were placed in the formalin. Final image was stored. Patient tolerated the procedure well and remained hemodynamically stable throughout. No complications were encountered and no significant blood loss IMPRESSION: Status post CT-guided biopsy of right-sided liver mass. Signed, Dulcy Fanny. Nadene Rubins, RPVI [facility] Radiology Electronically Signed   By: Corrie Mckusick D.O.   On: 08/25/2021 12:56   DG CHEST PORT 1 VIEW  Result Date: 08/20/2021 CLINICAL DATA:  Encounter for fever. EXAM: PORTABLE CHEST 1 VIEW COMPARISON:  None Available. FINDINGS: Cardiac silhouette and mediastinal contours are within normal limits. The lungs are clear. No pleural effusion or pneumothorax. Moderate multilevel degenerative bridging osteophytes of the thoracic spine. IMPRESSION: No acute cardiopulmonary disease process. Electronically Signed   By: Yvonne Kendall M.D.   On: 08/20/2021 09:09   IR EXCHANGE BILIARY DRAIN  Result Date: 08/19/2021 INDICATION: 56 year old male with obstructive hilar mass presenting with cholestasis status post right percutaneous biliary drain placement on 08/16/2021 with persistent but improved hyperbilirubinemia, still lacking tissue diagnosis. EXAM: 1. Percutaneous cholangiogram via indwelling right biliary drain 2. Biliary brush biopsy 3. Biliary drain exchange MEDICATIONS: ;  The antibiotic was administered within an appropriate time frame prior to the initiation of the procedure. ANESTHESIA/SEDATION: Moderate (conscious) sedation was employed during this procedure. A total of Versed 4 mg and Fentanyl 300 mcg was administered intravenously. Moderate Sedation Time: 36 minutes. The patient's level of consciousness and vital signs were monitored continuously by radiology nursing throughout the procedure under my direct supervision. FLUOROSCOPY TIME:  Fluoroscopy Time: 206 mGy CONTRAST:  Fifteen mL Omnipaque 300, administered into the biliary tree COMPLICATIONS: None immediate. PROCEDURE: Informed written consent was obtained from the patient after a thorough discussion of the procedural risks, benefits and alternatives. All questions were addressed. Maximal Sterile Barrier Technique was utilized including caps, mask, sterile gowns, sterile gloves, sterile drape, hand hygiene and skin antiseptic. A timeout was performed prior to the initiation of the procedure. The right upper quadrant indwelling right-sided biliary drain were prepped and draped in standard fashion. Hand injection of contrast via the indwelling biliary drain demonstrates decompression of the right-sided biliary tree and mild left intrahepatic biliary ductal dilation. The left side fills promptly upon injection. Contrast drains into the duodenum via the pigtail portion of the drain. An Amplatz wire was inserted via the indwelling drain after releasing the pigtail suture and the drain was exchanged for an 8 [place] sheath. A 0.018 inch safety wire was then placed with the tip in the duodenum. The Amplatz wire was removed. Biliary brush biopsy was performed with a total of 3 separate brush devices at the confluence of the left and  right biliary tree. Samples were placed in CytoLyt and sent to Pathology. A Kumpe the catheter was then inserted over the 0.018 inch wire and directed to the level of the duodenum. The wire was  exchanged for an Amplatz wire. The sheath was removed. Serial dilation was performed and a 49 [place] biliary drain was then placed. The pigtail portion was coiled in the duodenum and the distal radiopaque marker was positioned near the biliary radicle entry site in the right lobe of the liver. Hand injection of contrast demonstrated appropriate position with preferential flow through the drain into the duodenum. The drain was placed to bag drainage and a 0 silk suture was placed at the skin entry site. A sterile bandage was applied. The patient tolerated the procedure well without immediate complication and was transferred back to the floor in stable condition. IMPRESSION: 1. Cholangiogram demonstrates communication of the right left biliary tree with relative decompression of the right biliary ducts, mild dilation of the left biliary ducts. 2. Technically successful brush biopsy at the confluence of the left and right bile ducts. 3. Technically successful biliary drain upsize from 10 [place] to 12 [place]. PLAN: Follow-up cytology/pathology results. If nondiagnostic, recommend CT-guided liver biopsy given poor sonographic visualization of the hilar mass. Ruthann Cancer, MD [facility] Special[facility] Radiology Electronically Signed   By: Ruthann Cancer M.D.   On: 08/19/2021 14:58   IR ENDOLUMINAL BX OF BILIARY TREE  Result Date: 08/19/2021 INDICATION: 56 year old male with obstructive hilar mass presenting with cholestasis status post right percutaneous biliary drain placement on 08/16/2021 with persistent but improved hyperbilirubinemia, still lacking tissue diagnosis. EXAM: 1. Percutaneous cholangiogram via indwelling right biliary drain 2. Biliary brush biopsy 3. Biliary drain exchange MEDICATIONS: ; The antibiotic was administered within an appropriate time frame prior to the initiation of the procedure. ANESTHESIA/SEDATION: Moderate (conscious) sedation was employed during this  procedure. A total of Versed 4 mg and Fentanyl 300 mcg was administered intravenously. Moderate Sedation Time: 36 minutes. The patient's level of consciousness and vital signs were monitored continuously by radiology nursing throughout the procedure under my direct supervision. FLUOROSCOPY TIME:  Fluoroscopy Time: 206 mGy CONTRAST:  Fifteen mL Omnipaque 300, administered into the biliary tree COMPLICATIONS: None immediate. PROCEDURE: Informed written consent was obtained from the patient after a thorough discussion of the procedural risks, benefits and alternatives. All questions were addressed. Maximal Sterile Barrier Technique was utilized including caps, mask, sterile gowns, sterile gloves, sterile drape, hand hygiene and skin antiseptic. A timeout was performed prior to the initiation of the procedure. The right upper quadrant indwelling right-sided biliary drain were prepped and draped in standard fashion. Hand injection of contrast via the indwelling biliary drain demonstrates decompression of the right-sided biliary tree and mild left intrahepatic biliary ductal dilation. The left side fills promptly upon injection. Contrast drains into the duodenum via the pigtail portion of the drain. An Amplatz wire was inserted via the indwelling drain after releasing the pigtail suture and the drain was exchanged for an 8 [place] sheath. A 0.018 inch safety wire was then placed with the tip in the duodenum. The Amplatz wire was removed. Biliary brush biopsy was performed with a total of 3 separate brush devices at the confluence of the left and right biliary tree. Samples were placed in CytoLyt and sent to Pathology. A Kumpe the catheter was then inserted over the 0.018 inch wire and directed to the level of the duodenum. The wire was exchanged for an Amplatz wire.  The sheath was removed. Serial dilation was performed and a 92 [place] biliary drain was then placed. The pigtail portion was coiled in the duodenum and the  distal radiopaque marker was positioned near the biliary radicle entry site in the right lobe of the liver. Hand injection of contrast demonstrated appropriate position with preferential flow through the drain into the duodenum. The drain was placed to bag drainage and a 0 silk suture was placed at the skin entry site. A sterile bandage was applied. The patient tolerated the procedure well without immediate complication and was transferred back to the floor in stable condition. IMPRESSION: 1. Cholangiogram demonstrates communication of the right left biliary tree with relative decompression of the right biliary ducts, mild dilation of the left biliary ducts. 2. Technically successful brush biopsy at the confluence of the left and right bile ducts. 3. Technically successful biliary drain upsize from 10 [place] to 12 [place]. PLAN: Follow-up cytology/pathology results. If nondiagnostic, recommend CT-guided liver biopsy given poor sonographic visualization of the hilar mass. Ruthann Cancer, MD [facility] Radiology Electronically Signed   By: Ruthann Cancer M.D.   On: 08/19/2021 14:58   IR INT EXT BILIARY DRAIN WITH CHOLANGIOGRAM  Result Date: 08/16/2021 INDICATION: 56 year old with obstructive jaundice likely secondary to malignant neoplasm. Patient is being evaluated for percutaneous biliary drainage and percutaneous biopsy. EXAM: 1. Percutaneous transhepatic cholangiogram with ultrasound and fluoroscopic guidance 2. Placement of internal/external biliary drain with ultrasound and fluoroscopic guidance MEDICATIONS: Rocephin 2 g; The antibiotic was administered within an appropriate time frame prior to the initiation of the procedure. ANESTHESIA/SEDATION: Moderate (conscious) sedation was employed during this procedure. A total of Versed 6.0 mg and Fentanyl 250 mcg was administered intravenously by the radiology nurse. Total intra-service moderate Sedation Time: 54  minutes. The patient's level of consciousness and vital signs were monitored continuously by radiology nursing throughout the procedure under my direct supervision. FLUOROSCOPY: Radiation Exposure Index (as provided by the fluoroscopic device): 332 mGy Kerma COMPLICATIONS: None immediate. PROCEDURE: Informed written consent was obtained from the patient after a thorough discussion of the procedural risks, benefits and alternatives. All questions were addressed. Maximal Sterile Barrier Technique was utilized including caps, mask, sterile gowns, sterile gloves, sterile drape, hand hygiene and skin antiseptic. A timeout was performed prior to the initiation of the procedure. Patient was placed supine on the interventional table. The anterior and right side of the abdomen was prepped and draped in sterile fashion. Ultrasound was used to identify dilated ducts in the left hepatic lobe. The anterior abdomen was anesthetized with 1% lidocaine. Using ultrasound guidance, a 21 gauge needle was directed into a dilated peripheral duct and percutaneous transhepatic cholangiogram was performed. Multiple attempts were made to advance a wire through this access but this was not successful due to tortuosity of the bile duct. Therefore, attention was directed to the right hepatic lobe. A dilated peripheral right hepatic duct was identified with ultrasound and targeted. The right side of the abdomen was anesthetized with 1% lidocaine. Using ultrasound guidance, 21 gauge needle was directed into the dilated peripheral duct and contrast injection confirmed placement in the biliary system. A 0.018 wire was advanced into the central hepatic ducts and a transitional dilator set was successfully placed. Additional cholangiogram images were obtained. Eventually this access was upsized to a 5 [place] Kumpe catheter over a J wire. Initially, the common hepatic duct was not identified but eventually a small amount of contrast was noted within  the common  hepatic duct and this was successfully cannulated with a Roadrunner wire. The Kumpe catheter was eventually advanced into the duodenum using a Bentson wire. The tract was dilated and a 10 [place] biliary drain was advanced over the wire. The distal aspect of the drain was placed in the duodenum. Contrast injection confirmed appropriate placement in the biliary system. A sample of bile was collected for cytology. Drain was flushed with saline and attached to a gravity bag. Bandage was placed. FINDINGS: Bilateral intrahepatic biliary dilatation identified with ultrasound. The lesion seen on CT and MRI is inconspicuous with ultrasound. Left hepatic duct was initially punctured using ultrasound guidance and cholangiogram demonstrated filling of dilated left hepatic ducts and at least 1 right hepatic duct. Second percutaneous transhepatic cholangiogram demonstrated filling of a dilated right hepatic duct and additional dilated intrahepatic ducts were identified. Eventually, the common hepatic duct and the common bile duct were identified. No significant dilatation of the extrahepatic biliary system. The area of obstruction is at the biliary confluence. A drainage catheter was successfully advanced to the duodenum. Final cholangiogram images demonstrate that majority of the intrahepatic bile ducts were opacified. There is a small segment of the right hepatic lobe which is not opacifying and likely obstructed from neoplasm and this finding was confirmed with ultrasound. IMPRESSION: 1. Successful percutaneous transhepatic cholangiogram and placement of an internal/external biliary drain via a right hepatic duct. Drain was successfully advanced into the duodenum. The obstruction is at the biliary confluence. 2. Hepatic lesion seen on recent CT and MRI is poorly visualized with ultrasound. Small segment of the right hepatic lobe appears to have persistent biliary dilatation after drain placement and suspect that the  mass lesion is in this area. 3. Fluid was sent for cytology. Depending on the results of the cytology, we may need to re-assess for image guided percutaneous biopsy of the liver lesion versus brush biopsy at the biliary confluence. Electronically Signed   By: Markus Daft M.D.   On: 08/16/2021 17:30   MR ABDOMEN MRCP W WO CONTAST  Result Date: 08/14/2021 CLINICAL DATA:  Jaundice EXAM: MRI ABDOMEN WITHOUT AND WITH CONTRAST (INCLUDING MRCP) TECHNIQUE: Multiplanar multisequence MR imaging of the abdomen was performed both before and after the administration of intravenous contrast. Heavily T2-weighted images of the biliary and pancreatic ducts were obtained, and three-dimensional MRCP images were rendered by post processing. CONTRAST:  48m GADAVIST GADOBUTROL 1 MMOL/ML IV SOLN COMPARISON:  CT abdomen and pelvis dated August 13, 2021 FINDINGS: Lower chest: No acute findings. Hepatobiliary: Irregular mass of segment 8 liver measuring 2.9 x 2.8 cm on series 3, image 14 with intermediate T2 signal, low T1 signal and perilesional enhancement that gradually fills in on more delayed imaging. Diffuse mild intrahepatic biliary ductal dilation with more focal intrahepatic ductal dilation seen distal to the mass. Normal caliber common bile duct with wall thickening. No choledocholithiasis. Gallbladder is decompressed. Pancreas: No mass, inflammatory changes, or other parenchymal abnormality identified. Spleen:  Within normal limits in size and appearance. Adrenals/Urinary Tract: Bilateral adrenal glands are unremarkable. 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 Stomach/Bowel: Visualized portions within the abdomen are unremarkable. Vascular/Lymphatic: No pathologically enlarged lymph nodes identified. No abdominal aortic aneurysm demonstrated. Other:  None. Musculoskeletal: No suspicious bone lesions identified. IMPRESSION: 1. Irregular mass of segment 8 of the liver with perilesional enhancement that  gradually fills in on more delayed imaging, concerning for cholangiocarcinoma. 2. Diffuse mild intrahepatic biliary ductal dilation with wall thickening of the common bile  duct. Findings are concerning for tumor extension along the common bile duct. 3. Gallbladder is thick-walled and decompressed, concerning for additional site of tumor extension. Electronically Signed   By: Yetta Glassman M.D.   On: 08/14/2021 12:22   MR 3D Recon At Scanner  Result Date: 08/14/2021 CLINICAL DATA:  Jaundice EXAM: MRI ABDOMEN WITHOUT AND WITH CONTRAST (INCLUDING MRCP) TECHNIQUE: Multiplanar multisequence MR imaging of the abdomen was performed both before and after the administration of intravenous contrast. Heavily T2-weighted images of the biliary and pancreatic ducts were obtained, and three-dimensional MRCP images were rendered by post processing. CONTRAST:  61m GADAVIST GADOBUTROL 1 MMOL/ML IV SOLN COMPARISON:  CT abdomen and pelvis dated August 13, 2021 FINDINGS: Lower chest: No acute findings. Hepatobiliary: Irregular mass of segment 8 liver measuring 2.9 x 2.8 cm on series 3, image 14 with intermediate T2 signal, low T1 signal and perilesional enhancement that gradually fills in on more delayed imaging. Diffuse mild intrahepatic biliary ductal dilation with more focal intrahepatic ductal dilation seen distal to the mass. Normal caliber common bile duct with wall thickening. No choledocholithiasis. Gallbladder is decompressed. Pancreas: No mass, inflammatory changes, or other parenchymal abnormality identified. Spleen:  Within normal limits in size and appearance. Adrenals/Urinary Tract: Bilateral adrenal glands are unremarkable. 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 Stomach/Bowel: Visualized portions within the abdomen are unremarkable. Vascular/Lymphatic: No pathologically enlarged lymph nodes identified. No abdominal aortic aneurysm demonstrated. Other:  None. Musculoskeletal: No suspicious  bone lesions identified. IMPRESSION: 1. Irregular mass of segment 8 of the liver with perilesional enhancement that gradually fills in on more delayed imaging, concerning for cholangiocarcinoma. 2. Diffuse mild intrahepatic biliary ductal dilation with wall thickening of the common bile duct. Findings are concerning for tumor extension along the common bile duct. 3. Gallbladder is thick-walled and decompressed, concerning for additional site of tumor extension. Electronically Signed   By: LYetta GlassmanM.D.   On: 08/14/2021 12:22   CT ABDOMEN PELVIS W CONTRAST  Result Date: 08/13/2021 CLINICAL DATA:  Abdominal pain and jaundice. EXAM: CT ABDOMEN AND PELVIS WITH CONTRAST TECHNIQUE: Multidetector CT imaging of the abdomen and pelvis was performed using the standard protocol following bolus administration of intravenous contrast. RADIATION DOSE REDUCTION: This exam was performed according to the departmental dose-optimization program which includes automated exposure control, adjustment of the mA and/or kV according to patient size and/or use of iterative reconstruction technique. CONTRAST:  1038mOMNIPAQUE IOHEXOL 300 MG/ML  SOLN COMPARISON:  None Available. FINDINGS: Lower chest: No acute abnormality. The cardiac size is normal. Small hiatal hernia. Hepatobiliary: Liver is 18 cm length slightly steatotic. In hepatic segment 5 near the liver hilum there is a heterogeneous masslike area of hypoenhancement measuring 2.9 x 2.7 cm on series 2 axial image 19 and 2.7 cm height on coronal reconstruction image 67. There is encasement and apparent complete effacement of the anterior division of the right portal vein associated with this, intrahepatic biliary dilatation above this in segment 8, and intrahepatic biliary dilatation in both segments of the left lobe. The hepatic ductal confluence is just below this and is not well seen but is suspected to be obstructed for the most part. There is thickening of the wall in  the common hepatic duct and probably also in the cystic duct with gallbladder mostly contracted but potentially thickened and with pericholecystic fluid also seen. No calcified stone is evident. The common bile duct is normal caliber below this level and it is unremarkable and not  thickened. Primary bile duct neoplasm and primary hepatic neoplasm with secondary biliary obstruction are the most likely possibilities. An inflammatory/infectious process with phlegmon causing a masslike abnormality is possible but this should be considered a neoplasm until proven otherwise. No other focal liver abnormality is seen. The main portal vein and other portal venous branches opacify well with prominent main portal vein measuring 15.4 mm. Pancreas: No focal abnormality. No ductal dilatation or inflammatory change. Spleen: Mildly prominent, 13.1 cm AP, no mass enhancement. Adrenals/Urinary Tract: There is no adrenal mass. 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 There is no renal mass. No urinary stone or obstruction is evident. There is mild general thickening of the bladder without inflammatory change. Stomach/Bowel: Small hiatal hernia. No bowel dilatation or wall thickening including the appendix. Moderate fecal stasis particularly in the ascending colon is seen, scattered diverticula left-sided colon without diverticulitis. Vascular/Lymphatic: There is mild aortic atherosclerosis without AAA. As above the anterior division of the right portal vein does not opacify and suspected encased and effaced. There is a normal IVC and deep pelvic veins, normal SMV and splenic vein enhancement. There are scattered periportal and portacaval retroperitoneal lymph nodes up to 7 mm in short axis but no area lymph nodes which exceed pathologic size criteria. There is no gastrohepatic ligament or other adenopathy , no mesenteric or pelvic mass is seen. Reproductive: Enlarged prostate, 5.2 cm transverse with bladder  base impression. Multiple phleboliths both pelvic sidewalls. Other: Small umbilical and inguinal fat hernias. No incarcerated hernia. No free air, hemorrhage or ascites. Musculoskeletal: Advanced degenerative disc disease and grade 1 retrolisthesis and spondylosis L5-S1 with acquired spinal stenosis, acquired moderate foraminal stenosis. Schmorl's nodes lower thoracic spine. No destructive bone lesion is seen. IMPRESSION: 1. 2.9 x 2.7 x 2.7 cm hypoenhancing mass in segment 5 near the liver hilum just above the hepatic ductal confluence, suspected either primary bile duct malignancy or primary hepatic malignancy with secondary biliary obstruction. 2. The common hepatic duct is thickened and there is intrahepatic biliary dilatation throughout the left lobe and above the mass in segment 8. 3. Possible an inflammatory/infectious process could produce these findings but this should be considered neoplasm until proven otherwise. 4. There appears to be additional thickening of the cystic duct and there is contraction and possible thickening of the gallbladder with pericholecystic fluid. Coexisting cholecystitis is not excluded. No calcified stone or common bile duct dilatation is seen. 5. No appreciable opacification in the anterior branch of the right portal vein which is suspected encased and effaced by the process. There is mild prominence of the main portal vein with remainder of the portal vein branches opacifying well. 6. Subcentimeter periportal and portacaval space lymph nodes up to 7 mm in short axis but no lymph nodes exceeding the pathologic size criteria. 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 8. Slight splenomegaly, slight hepatic steatosis. 9. Constipation and diverticulosis. 10. Enlarged prostate impressing on the posterior bladder with mild bladder thickening most likely due to hypertrophy. 11. Umbilical and inguinal fat hernias. Additional findings described above.  Electronically Signed   By: Telford Nab M.D.   On: 08/13/2021 20:41     CT BIOPSY  Result Date: 08/25/2021 INDICATION: 56 year old male presents for biopsy of right liver mass EXAM: CT BIOPSY MEDICATIONS: None. ANESTHESIA/SEDATION: Moderate (conscious) sedation was employed during this procedure. A total of Versed 3.0 mg and Fentanyl 150 mcg was administered intravenously. Moderate Sedation Time: 21 minutes. The patient's level of  consciousness and vital signs were monitored continuously by radiology nursing throughout the procedure under my direct supervision. FLUOROSCOPY TIME:  CT COMPLICATIONS: None PROCEDURE: Informed written consent was obtained from the patient after a thorough discussion of the procedural risks, benefits and alternatives. All questions were addressed. Maximal Sterile Barrier Technique was utilized including caps, mask, sterile gowns, sterile gloves, sterile drape, hand hygiene and skin antiseptic. A timeout was performed prior to the initiation of the procedure. Patient positioned supine position on the CT gantry table. Scout CT acquired for planning purposes. The patient is prepped and draped in the usual sterile fashion. 1% lidocaine was used for local anesthesia. Using CT guidance, guide needle was advanced to the hypodense lesion within the right liver. Once we confirmed needle tip position multiple 18 gauge core biopsy were performed. Gel-Foam pledgets infuse with a small amount of saline. Needle was removed. Samples were placed in the formalin. Final image was stored. Patient tolerated the procedure well and remained hemodynamically stable throughout. No complications were encountered and no significant blood loss IMPRESSION: Status post CT-guided biopsy of right-sided liver mass. Signed, Dulcy Fanny. Nadene Rubins, RPVI [facility] Radiology Electronically Signed   By: Corrie Mckusick D.O.   On: 08/25/2021 12:56   DG CHEST PORT 1  VIEW  Result Date: 08/20/2021 CLINICAL DATA:  Encounter for fever. EXAM: PORTABLE CHEST 1 VIEW COMPARISON:  None Available. FINDINGS: Cardiac silhouette and mediastinal contours are within normal limits. The lungs are clear. No pleural effusion or pneumothorax. Moderate multilevel degenerative bridging osteophytes of the thoracic spine. IMPRESSION: No acute cardiopulmonary disease process. Electronically Signed   By: Yvonne Kendall M.D.   On: 08/20/2021 09:09   IR EXCHANGE BILIARY DRAIN  Result Date: 08/19/2021 INDICATION: 56 year old male with obstructive hilar mass presenting with cholestasis status post right percutaneous biliary drain placement on 08/16/2021 with persistent but improved hyperbilirubinemia, still lacking tissue diagnosis. EXAM: 1. Percutaneous cholangiogram via indwelling right biliary drain 2. Biliary brush biopsy 3. Biliary drain exchange MEDICATIONS: ; The antibiotic was administered within an appropriate time frame prior to the initiation of the procedure. ANESTHESIA/SEDATION: Moderate (conscious) sedation was employed during this procedure. A total of Versed 4 mg and Fentanyl 300 mcg was administered intravenously. Moderate Sedation Time: 36 minutes. The patient's level of consciousness and vital signs were monitored continuously by radiology nursing throughout the procedure under my direct supervision. FLUOROSCOPY TIME:  Fluoroscopy Time: 206 mGy CONTRAST:  Fifteen mL Omnipaque 300, administered into the biliary tree COMPLICATIONS: None immediate. PROCEDURE: Informed written consent was obtained from the patient after a thorough discussion of the procedural risks, benefits and alternatives. All questions were addressed. Maximal Sterile Barrier Technique was utilized including caps, mask, sterile gowns, sterile gloves, sterile drape, hand hygiene and skin antiseptic. A timeout was performed prior to the initiation of the procedure. The right upper quadrant indwelling right-sided biliary  drain were prepped and draped in standard fashion. Hand injection of contrast via the indwelling biliary drain demonstrates decompression of the right-sided biliary tree and mild left intrahepatic biliary ductal dilation. The left side fills promptly upon injection. Contrast drains into the duodenum via the pigtail portion of the drain. An Amplatz wire was inserted via the indwelling drain after releasing the pigtail suture and the drain was exchanged for an 8 [place] sheath. A 0.018 inch safety wire was then placed with the tip in the duodenum. The Amplatz wire was removed. Biliary brush biopsy was performed with a total of 3  separate brush devices at the confluence of the left and right biliary tree. Samples were placed in CytoLyt and sent to Pathology. A Kumpe the catheter was then inserted over the 0.018 inch wire and directed to the level of the duodenum. The wire was exchanged for an Amplatz wire. The sheath was removed. Serial dilation was performed and a 65 [place] biliary drain was then placed. The pigtail portion was coiled in the duodenum and the distal radiopaque marker was positioned near the biliary radicle entry site in the right lobe of the liver. Hand injection of contrast demonstrated appropriate position with preferential flow through the drain into the duodenum. The drain was placed to bag drainage and a 0 silk suture was placed at the skin entry site. A sterile bandage was applied. The patient tolerated the procedure well without immediate complication and was transferred back to the floor in stable condition. IMPRESSION: 1. Cholangiogram demonstrates communication of the right left biliary tree with relative decompression of the right biliary ducts, mild dilation of the left biliary ducts. 2. Technically successful brush biopsy at the confluence of the left and right bile ducts. 3. Technically successful biliary drain upsize from 10 [place] to 12 [place]. PLAN: Follow-up cytology/pathology results.  If nondiagnostic, recommend CT-guided liver biopsy given poor sonographic visualization of the hilar mass. Ruthann Cancer, MD [facility] Radiology Electronically Signed   By: Ruthann Cancer M.D.   On: 08/19/2021 14:58   IR ENDOLUMINAL BX OF BILIARY TREE  Result Date: 08/19/2021 INDICATION: 56 year old male with obstructive hilar mass presenting with cholestasis status post right percutaneous biliary drain placement on 08/16/2021 with persistent but improved hyperbilirubinemia, still lacking tissue diagnosis. EXAM: 1. Percutaneous cholangiogram via indwelling right biliary drain 2. Biliary brush biopsy 3. Biliary drain exchange MEDICATIONS: ; The antibiotic was administered within an appropriate time frame prior to the initiation of the procedure. ANESTHESIA/SEDATION: Moderate (conscious) sedation was employed during this procedure. A total of Versed 4 mg and Fentanyl 300 mcg was administered intravenously. Moderate Sedation Time: 36 minutes. The patient's level of consciousness and vital signs were monitored continuously by radiology nursing throughout the procedure under my direct supervision. FLUOROSCOPY TIME:  Fluoroscopy Time: 206 mGy CONTRAST:  Fifteen mL Omnipaque 300, administered into the biliary tree COMPLICATIONS: None immediate. PROCEDURE: Informed written consent was obtained from the patient after a thorough discussion of the procedural risks, benefits and alternatives. All questions were addressed. Maximal Sterile Barrier Technique was utilized including caps, mask, sterile gowns, sterile gloves, sterile drape, hand hygiene and skin antiseptic. A timeout was performed prior to the initiation of the procedure. The right upper quadrant indwelling right-sided biliary drain were prepped and draped in standard fashion. Hand injection of contrast via the indwelling biliary drain demonstrates decompression of the right-sided biliary tree and mild left  intrahepatic biliary ductal dilation. The left side fills promptly upon injection. Contrast drains into the duodenum via the pigtail portion of the drain. An Amplatz wire was inserted via the indwelling drain after releasing the pigtail suture and the drain was exchanged for an 8 [place] sheath. A 0.018 inch safety wire was then placed with the tip in the duodenum. The Amplatz wire was removed. Biliary brush biopsy was performed with a total of 3 separate brush devices at the confluence of the left and right biliary tree. Samples were placed in CytoLyt and sent to Pathology. A Kumpe the catheter was then inserted over the 0.018 inch wire and directed to the level of  the duodenum. The wire was exchanged for an Amplatz wire. The sheath was removed. Serial dilation was performed and a 23 [place] biliary drain was then placed. The pigtail portion was coiled in the duodenum and the distal radiopaque marker was positioned near the biliary radicle entry site in the right lobe of the liver. Hand injection of contrast demonstrated appropriate position with preferential flow through the drain into the duodenum. The drain was placed to bag drainage and a 0 silk suture was placed at the skin entry site. A sterile bandage was applied. The patient tolerated the procedure well without immediate complication and was transferred back to the floor in stable condition. IMPRESSION: 1. Cholangiogram demonstrates communication of the right left biliary tree with relative decompression of the right biliary ducts, mild dilation of the left biliary ducts. 2. Technically successful brush biopsy at the confluence of the left and right bile ducts. 3. Technically successful biliary drain upsize from 10 [place] to 12 [place]. PLAN: Follow-up cytology/pathology results. If nondiagnostic, recommend CT-guided liver biopsy given poor sonographic visualization of the hilar mass. Ruthann Cancer, MD [facility]  [facility] Radiology Electronically Signed   By: Ruthann Cancer M.D.   On: 08/19/2021 14:58   IR INT EXT BILIARY DRAIN WITH CHOLANGIOGRAM  Result Date: 08/16/2021 INDICATION: 56 year old with obstructive jaundice likely secondary to malignant neoplasm. Patient is being evaluated for percutaneous biliary drainage and percutaneous biopsy. EXAM: 1. Percutaneous transhepatic cholangiogram with ultrasound and fluoroscopic guidance 2. Placement of internal/external biliary drain with ultrasound and fluoroscopic guidance MEDICATIONS: Rocephin 2 g; The antibiotic was administered within an appropriate time frame prior to the initiation of the procedure. ANESTHESIA/SEDATION: Moderate (conscious) sedation was employed during this procedure. A total of Versed 6.0 mg and Fentanyl 250 mcg was administered intravenously by the radiology nurse. Total intra-service moderate Sedation Time: 54 minutes. The patient's level of consciousness and vital signs were monitored continuously by radiology nursing throughout the procedure under my direct supervision. FLUOROSCOPY: Radiation Exposure Index (as provided by the fluoroscopic device): 169 mGy Kerma COMPLICATIONS: None immediate. PROCEDURE: Informed written consent was obtained from the patient after a thorough discussion of the procedural risks, benefits and alternatives. All questions were addressed. Maximal Sterile Barrier Technique was utilized including caps, mask, sterile gowns, sterile gloves, sterile drape, hand hygiene and skin antiseptic. A timeout was performed prior to the initiation of the procedure. Patient was placed supine on the interventional table. The anterior and right side of the abdomen was prepped and draped in sterile fashion. Ultrasound was used to identify dilated ducts in the left hepatic lobe. The anterior abdomen was anesthetized with 1% lidocaine. Using ultrasound guidance, a 21 gauge needle was directed into a dilated peripheral duct and percutaneous  transhepatic cholangiogram was performed. Multiple attempts were made to advance a wire through this access but this was not successful due to tortuosity of the bile duct. Therefore, attention was directed to the right hepatic lobe. A dilated peripheral right hepatic duct was identified with ultrasound and targeted. The right side of the abdomen was anesthetized with 1% lidocaine. Using ultrasound guidance, 21 gauge needle was directed into the dilated peripheral duct and contrast injection confirmed placement in the biliary system. A 0.018 wire was advanced into the central hepatic ducts and a transitional dilator set was successfully placed. Additional cholangiogram images were obtained. Eventually this access was upsized to a 5 [place] Kumpe catheter over a J wire. Initially, the common hepatic duct was not identified but eventually a  small amount of contrast was noted within the common hepatic duct and this was successfully cannulated with a Roadrunner wire. The Kumpe catheter was eventually advanced into the duodenum using a Bentson wire. The tract was dilated and a 10 [place] biliary drain was advanced over the wire. The distal aspect of the drain was placed in the duodenum. Contrast injection confirmed appropriate placement in the biliary system. A sample of bile was collected for cytology. Drain was flushed with saline and attached to a gravity bag. Bandage was placed. FINDINGS: Bilateral intrahepatic biliary dilatation identified with ultrasound. The lesion seen on CT and MRI is inconspicuous with ultrasound. Left hepatic duct was initially punctured using ultrasound guidance and cholangiogram demonstrated filling of dilated left hepatic ducts and at least 1 right hepatic duct. Second percutaneous transhepatic cholangiogram demonstrated filling of a dilated right hepatic duct and additional dilated intrahepatic ducts were identified. Eventually, the common hepatic duct and the common bile duct were  identified. No significant dilatation of the extrahepatic biliary system. The area of obstruction is at the biliary confluence. A drainage catheter was successfully advanced to the duodenum. Final cholangiogram images demonstrate that majority of the intrahepatic bile ducts were opacified. There is a small segment of the right hepatic lobe which is not opacifying and likely obstructed from neoplasm and this finding was confirmed with ultrasound. IMPRESSION: 1. Successful percutaneous transhepatic cholangiogram and placement of an internal/external biliary drain via a right hepatic duct. Drain was successfully advanced into the duodenum. The obstruction is at the biliary confluence. 2. Hepatic lesion seen on recent CT and MRI is poorly visualized with ultrasound. Small segment of the right hepatic lobe appears to have persistent biliary dilatation after drain placement and suspect that the mass lesion is in this area. 3. Fluid was sent for cytology. Depending on the results of the cytology, we may need to re-assess for image guided percutaneous biopsy of the liver lesion versus brush biopsy at the biliary confluence. Electronically Signed   By: Markus Daft M.D.   On: 08/16/2021 17:30   MR ABDOMEN MRCP W WO CONTAST  Result Date: 08/14/2021 CLINICAL DATA:  Jaundice EXAM: MRI ABDOMEN WITHOUT AND WITH CONTRAST (INCLUDING MRCP) TECHNIQUE: Multiplanar multisequence MR imaging of the abdomen was performed both before and after the administration of intravenous contrast. Heavily T2-weighted images of the biliary and pancreatic ducts were obtained, and three-dimensional MRCP images were rendered by post processing. CONTRAST:  17m GADAVIST GADOBUTROL 1 MMOL/ML IV SOLN COMPARISON:  CT abdomen and pelvis dated August 13, 2021 FINDINGS: Lower chest: No acute findings. Hepatobiliary: Irregular mass of segment 8 liver measuring 2.9 x 2.8 cm on series 3, image 14 with intermediate T2 signal, low T1 signal and perilesional  enhancement that gradually fills in on more delayed imaging. Diffuse mild intrahepatic biliary ductal dilation with more focal intrahepatic ductal dilation seen distal to the mass. Normal caliber common bile duct with wall thickening. No choledocholithiasis. Gallbladder is decompressed. Pancreas: No mass, inflammatory changes, or other parenchymal abnormality identified. Spleen:  Within normal limits in size and appearance. Adrenals/Urinary Tract: Bilateral adrenal glands are unremarkable. 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 Stomach/Bowel: Visualized portions within the abdomen are unremarkable. Vascular/Lymphatic: No pathologically enlarged lymph nodes identified. No abdominal aortic aneurysm demonstrated. Other:  None. Musculoskeletal: No suspicious bone lesions identified. IMPRESSION: 1. Irregular mass of segment 8 of the liver with perilesional enhancement that gradually fills in on more delayed imaging, concerning for cholangiocarcinoma. 2. Diffuse mild intrahepatic biliary  ductal dilation with wall thickening of the common bile duct. Findings are concerning for tumor extension along the common bile duct. 3. Gallbladder is thick-walled and decompressed, concerning for additional site of tumor extension. Electronically Signed   By: Yetta Glassman M.D.   On: 08/14/2021 12:22   MR 3D Recon At Scanner  Result Date: 08/14/2021 CLINICAL DATA:  Jaundice EXAM: MRI ABDOMEN WITHOUT AND WITH CONTRAST (INCLUDING MRCP) TECHNIQUE: Multiplanar multisequence MR imaging of the abdomen was performed both before and after the administration of intravenous contrast. Heavily T2-weighted images of the biliary and pancreatic ducts were obtained, and three-dimensional MRCP images were rendered by post processing. CONTRAST:  49m GADAVIST GADOBUTROL 1 MMOL/ML IV SOLN COMPARISON:  CT abdomen and pelvis dated August 13, 2021 FINDINGS: Lower chest: No acute findings. Hepatobiliary: Irregular mass of  segment 8 liver measuring 2.9 x 2.8 cm on series 3, image 14 with intermediate T2 signal, low T1 signal and perilesional enhancement that gradually fills in on more delayed imaging. Diffuse mild intrahepatic biliary ductal dilation with more focal intrahepatic ductal dilation seen distal to the mass. Normal caliber common bile duct with wall thickening. No choledocholithiasis. Gallbladder is decompressed. Pancreas: No mass, inflammatory changes, or other parenchymal abnormality identified. Spleen:  Within normal limits in size and appearance. Adrenals/Urinary Tract: Bilateral adrenal glands are unremarkable. 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 Stomach/Bowel: Visualized portions within the abdomen are unremarkable. Vascular/Lymphatic: No pathologically enlarged lymph nodes identified. No abdominal aortic aneurysm demonstrated. Other:  None. Musculoskeletal: No suspicious bone lesions identified. IMPRESSION: 1. Irregular mass of segment 8 of the liver with perilesional enhancement that gradually fills in on more delayed imaging, concerning for cholangiocarcinoma. 2. Diffuse mild intrahepatic biliary ductal dilation with wall thickening of the common bile duct. Findings are concerning for tumor extension along the common bile duct. 3. Gallbladder is thick-walled and decompressed, concerning for additional site of tumor extension. Electronically Signed   By: LYetta GlassmanM.D.   On: 08/14/2021 12:22   CT ABDOMEN PELVIS W CONTRAST  Result Date: 08/13/2021 CLINICAL DATA:  Abdominal pain and jaundice. EXAM: CT ABDOMEN AND PELVIS WITH CONTRAST TECHNIQUE: Multidetector CT imaging of the abdomen and pelvis was performed using the standard protocol following bolus administration of intravenous contrast. RADIATION DOSE REDUCTION: This exam was performed according to the departmental dose-optimization program which includes automated exposure control, adjustment of the mA and/or kV according  to patient size and/or use of iterative reconstruction technique. CONTRAST:  1053mOMNIPAQUE IOHEXOL 300 MG/ML  SOLN COMPARISON:  None Available. FINDINGS: Lower chest: No acute abnormality. The cardiac size is normal. Small hiatal hernia. Hepatobiliary: Liver is 18 cm length slightly steatotic. In hepatic segment 5 near the liver hilum there is a heterogeneous masslike area of hypoenhancement measuring 2.9 x 2.7 cm on series 2 axial image 19 and 2.7 cm height on coronal reconstruction image 67. There is encasement and apparent complete effacement of the anterior division of the right portal vein associated with this, intrahepatic biliary dilatation above this in segment 8, and intrahepatic biliary dilatation in both segments of the left lobe. The hepatic ductal confluence is just below this and is not well seen but is suspected to be obstructed for the most part. There is thickening of the wall in the common hepatic duct and probably also in the cystic duct with gallbladder mostly contracted but potentially thickened and with pericholecystic fluid also seen. No calcified stone is evident. The common bile duct is normal caliber  below this level and it is unremarkable and not thickened. Primary bile duct neoplasm and primary hepatic neoplasm with secondary biliary obstruction are the most likely possibilities. An inflammatory/infectious process with phlegmon causing a masslike abnormality is possible but this should be considered a neoplasm until proven otherwise. No other focal liver abnormality is seen. The main portal vein and other portal venous branches opacify well with prominent main portal vein measuring 15.4 mm. Pancreas: No focal abnormality. No ductal dilatation or inflammatory change. Spleen: Mildly prominent, 13.1 cm AP, no mass enhancement. Adrenals/Urinary Tract: There is no adrenal mass. 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 There is no renal mass. No urinary stone or  obstruction is evident. There is mild general thickening of the bladder without inflammatory change. Stomach/Bowel: Small hiatal hernia. No bowel dilatation or wall thickening including the appendix. Moderate fecal stasis particularly in the ascending colon is seen, scattered diverticula left-sided colon without diverticulitis. Vascular/Lymphatic: There is mild aortic atherosclerosis without AAA. As above the anterior division of the right portal vein does not opacify and suspected encased and effaced. There is a normal IVC and deep pelvic veins, normal SMV and splenic vein enhancement. There are scattered periportal and portacaval retroperitoneal lymph nodes up to 7 mm in short axis but no area lymph nodes which exceed pathologic size criteria. There is no gastrohepatic ligament or other adenopathy , no mesenteric or pelvic mass is seen. Reproductive: Enlarged prostate, 5.2 cm transverse with bladder base impression. Multiple phleboliths both pelvic sidewalls. Other: Small umbilical and inguinal fat hernias. No incarcerated hernia. No free air, hemorrhage or ascites. Musculoskeletal: Advanced degenerative disc disease and grade 1 retrolisthesis and spondylosis L5-S1 with acquired spinal stenosis, acquired moderate foraminal stenosis. Schmorl's nodes lower thoracic spine. No destructive bone lesion is seen. IMPRESSION: 1. 2.9 x 2.7 x 2.7 cm hypoenhancing mass in segment 5 near the liver hilum just above the hepatic ductal confluence, suspected either primary bile duct malignancy or primary hepatic malignancy with secondary biliary obstruction. 2. The common hepatic duct is thickened and there is intrahepatic biliary dilatation throughout the left lobe and above the mass in segment 8. 3. Possible an inflammatory/infectious process could produce these findings but this should be considered neoplasm until proven otherwise. 4. There appears to be additional thickening of the cystic duct and there is contraction and  possible thickening of the gallbladder with pericholecystic fluid. Coexisting cholecystitis is not excluded. No calcified stone or common bile duct dilatation is seen. 5. No appreciable opacification in the anterior branch of the right portal vein which is suspected encased and effaced by the process. There is mild prominence of the main portal vein with remainder of the portal vein branches opacifying well. 6. Subcentimeter periportal and portacaval space lymph nodes up to 7 mm in short axis but no lymph nodes exceeding the pathologic size criteria. 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 8. Slight splenomegaly, slight hepatic steatosis. 9. Constipation and diverticulosis. 10. Enlarged prostate impressing on the posterior bladder with mild bladder thickening most likely due to hypertrophy. 11. Umbilical and inguinal fat hernias. Additional findings described above. Electronically Signed   By: Telford Nab M.D.   On: 08/13/2021 20:41    Assessment and Plan:  1.  Liver mass concerning for malignancy -CT abdomen/pelvis with contrast 08/14/1998 23-2.9 x 2.7 x 2.7 cm hypoenhancing mass in segment 5 near the liver hilum just above hepatic ductal confluence suspicious for primary bile duct malignancy or primary  hepatic malignancy -MRCP 08/14/2021-irregular mass of segment 8 of the liver with perilesional enhancement concerning for cholangiocarcinoma -08/14/2021 CA 19.9 was elevated at 76 -08/16/2021 AFP 6.4 -08/16/2021 cytology from bile duct brushing showed atypical cells -08/19/2021 cytology from bile duct brushing showed cells suspicious for malignancy -08/25/2021 CT-guided liver biopsy-results pending 2.  Obstructive jaundice -08/16/2021 percutaneous biliary drain placement 3.  Hepatic steatosis 4.  GERD 5.  Hypertension 6.  Normocytic anemia 7.  Enlarged prostate seen on CT 8.  Tobacco use 9.  Enterococcus and Aerococcus bacteremia 08/20/2021-discharged 08/26/2021 to  complete outpatient course of Augmentin    Jeffrey Blevins was admitted with obstructive jaundice.  Meeting findings showed a mass in the liver concerning for cholangiocarcinoma versus primary liver cancer.  AFP level is normal and CA 19.9 is mildly elevated.  Suspect this most likely represents a cholangiocarcinoma but will await biopsy results from the liver lesion.  Our plan is to discuss his case in the multidisciplinary GI conference.  We will ask general surgery to weigh in on whether this mass can be resected.  The patient has already been discharged today and will be leaving soon.  We will arrange for outpatient follow-up at the cancer center.  Recommendations: 1.  We will follow-up on liver biopsy results. 2.  We will discuss case in multidisciplinary GI conference to see if he is a surgical candidate. 3.  Outpatient follow-up at the cancer center.  Thank you for this referral.   Mikey Bussing, DNP, AGPCNP-BC, AOCNP   Jeffrey Blevins was interviewed and examined.  I reviewed the CT and MRI images.  He was admitted with obstructive jaundice.  He underwent placement of an external biliary drain for relief of central biliary obstruction.  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  A CT-guided biopsy was performed yesterday and the pathology is pending.  I discussed the probable diagnosis with Jeffrey Blevins and his wife.  Outpatient follow-up will be scheduled at the Cancer center.  His case will be presented at the multidisciplinary GI conference.  Preliminary review of the images was performed by surgical oncology.  They indicate he will need to be referred to an academic center to consider hepatic resection.  I was present greater than 50% of today's visit.  I performed medical decision making.

## 2021-08-26 NOTE — Progress Notes (Signed)
PATIENT NAVIGATOR PROGRESS NOTE  Name: Jeffrey Blevins Date: 08/26/2021 MRN: [id number]  DOB: December 28, 1965   Reason for visit:  Introductory phone call  Comments:  Called patient and scheduled him for New pt/hospital F/U with Dr Benay Spice on 09/02/21 at 3:30. Directions to building and parking reviewed as well as one support person allowed in appt. Verbalized understanding Contact information given and encouraged to call with any questions    Time spent counseling/coordinating care: 15-30 minutes

## 2021-08-27 NOTE — Telephone Encounter (Signed)
TC to Pt message going straight to voicemail, Left message to return call to Pt's voicemail. Pt's wife's number called as well unable to leave voicemail.

## 2021-09-01 ENCOUNTER — Other Ambulatory Visit: Payer: Self-pay

## 2021-09-01 ENCOUNTER — Encounter: Payer: Self-pay | Admitting: *Deleted

## 2021-09-01 NOTE — Progress Notes (Signed)
Referral faxed to Dr Fayrene Helper at [place]

## 2021-09-01 NOTE — Progress Notes (Signed)
The proposed treatment discussed in conference is for discussion purpose only and is not a binding recommendation.  The patients have not been physically examined, or presented with their treatment options.  Therefore, final treatment plans cannot be decided.  

## 2021-09-02 ENCOUNTER — Inpatient Hospital Stay: Payer: Managed Care, Other (non HMO) | Attending: Oncology | Admitting: Oncology

## 2021-09-02 ENCOUNTER — Encounter: Payer: Self-pay | Admitting: *Deleted

## 2021-09-02 ENCOUNTER — Encounter: Payer: Self-pay | Admitting: Oncology

## 2021-09-02 VITALS — BP 127/88 | HR 90 | Temp 98.1°F | Resp 18 | Ht 74.0 in | Wt 189.0 lb

## 2021-09-02 DIAGNOSIS — K831 Obstruction of bile duct: Secondary | ICD-10-CM | POA: Diagnosis not present

## 2021-09-02 DIAGNOSIS — C221 Intrahepatic bile duct carcinoma: Secondary | ICD-10-CM | POA: Diagnosis present

## 2021-09-02 DIAGNOSIS — D649 Anemia, unspecified: Secondary | ICD-10-CM | POA: Diagnosis not present

## 2021-09-02 NOTE — Progress Notes (Signed)
PATIENT NAVIGATOR PROGRESS NOTE  Name: DESHAY BLUMENFELD Date: 09/02/2021 MRN: [id number]  DOB: 1965/10/25   Reason for visit:  Initial visit with Dr Benay Spice  Comments:  Met with Mr and Mrs Tandon during visit with Dr Benay Spice. Referral placed to Dr Fayrene Helper at [facility] for surgery consult, patient will call when he has appt set up Return to clinic to see Dr Benay Spice on 7/19 Reinforced contact information to call with any issues or questions    Time spent counseling/coordinating care: > 60 minutes

## 2021-09-02 NOTE — Progress Notes (Signed)
[place] OFFICE PROGRESS NOTE   Diagnosis: Cholangiocarcinoma  INTERVAL HISTORY:   Jeffrey Blevins was discharged from the hospital 08/26/2021.  He had a fever of greater than 102 degrees on the day following discharge.  He took Tylenol.  The fever has not recurred.  He continues Augmentin.  Jaundice has improved.  Objective:  Vital signs in last 24 hours:  Blood pressure 127/88, pulse 90, temperature 98.1 F (36.7 C), temperature source Oral, resp. rate 18, height '6\' 2"'$  (1.88 m), weight 189 lb (85.7 kg), SpO2 100 %.    HEENT: Mild scleral icterus Resp: Lungs clear bilaterally Cardio: Regular rate and rhythm GI: No hepatosplenomegaly, right upper quadrant biliary drain site without evidence of infection Vascular: No leg edema  Skin: Jaundice  Lab Results:  Lab Results  Component Value Date   WBC 7.7 08/25/2021   HGB 10.8 (L) 08/25/2021   HCT 32.7 (L) 08/25/2021   MCV 94.0 08/25/2021   PLT 319 08/25/2021   NEUTROABS 6.5 08/21/2021    CMP  Lab Results  Component Value Date   NA 139 08/24/2021   K 3.6 08/24/2021   CL 108 08/24/2021   CO2 23 08/24/2021   GLUCOSE 102 (H) 08/24/2021   BUN 15 08/24/2021   CREATININE 0.90 08/24/2021   CALCIUM 8.9 08/24/2021   PROT 6.3 (L) 08/24/2021   ALBUMIN 2.9 (L) 08/24/2021   AST 74 (H) 08/24/2021   ALT 121 (H) 08/24/2021   ALKPHOS 152 (H) 08/24/2021   BILITOT 4.9 (H) 08/24/2021   GFRNONAA >60 08/24/2021    Lab Results  Component Value Date   MHD622 76 (H) 08/14/2021      Medications: I have reviewed the patient's current medications.   Assessment/Plan:  Liver mass concerning for malignancy -CT abdomen/pelvis with contrast 08/14/1998 23-2.9 x 2.7 x 2.7 cm hypoenhancing mass in segment 5 near the liver hilum just above hepatic ductal confluence suspicious for primary bile duct malignancy or primary hepatic malignancy -MRCP 08/14/2021-irregular mass of segment 8 of the liver with perilesional enhancement  concerning for cholangiocarcinoma -08/14/2021 CA 19.9 was elevated at 76 -08/16/2021 AFP 6.4 -08/16/2021 cytology from bile duct brushing showed atypical cells -08/19/2021 cytology from bile duct brushing showed cells suspicious for malignancy -08/25/2021 CT-guided liver biopsy-moderately differentiated adenocarcinoma, CK7 positive, CDX2 positive in a small population of cells, TTF-1 negative, CK20 negative-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 2.  Obstructive jaundice -08/16/2021 percutaneous biliary drain placement 3.  Hepatic steatosis 4.  GERD 5.  Hypertension 6.  Normocytic anemia 7.  Enlarged prostate seen on CT 8.  Tobacco use 9.  Enterococcus and Aerococcus bacteremia 08/20/2021-discharged 08/26/2021 to complete outpatient course of Augmentin       Disposition: Jeffrey . Blevins has been diagnosed with adenocarcinoma involving a liver mass.  He presented with biliary obstruction secondary to a central right liver mass.  The clinical presentation and pathology are consistent with a diagnosis of intrahepatic cholangiocarcinoma.  I present his case at the GI tumor conference yesterday.  The surgical oncologist present feels the tumor is resectable and recommends a referral to [facility] to consider surgical options.  We will make referral to Dr. Fayrene Helper.  I discussed the potential recommendation for adjuvant systemic therapy, and also hepatic directed therapy.  I reviewed the CT and MRI images with Jeffrey Blevins's family.  He will need to undergo a staging chest CT prior to surgery.  Jeffrey Blevins will seek medical attention for a recurrent fever.  He will return for an office visit in 2 weeks.`  Betsy Coder, MD  09/02/2021  4:25 PM

## 2021-09-03 ENCOUNTER — Other Ambulatory Visit: Payer: Self-pay | Admitting: *Deleted

## 2021-09-10 ENCOUNTER — Encounter: Payer: Self-pay | Admitting: *Deleted

## 2021-09-10 NOTE — Progress Notes (Signed)
Received forms and record request from [facility]  Phone [phone] ext [phone]. Spoke with her to inform her that his treatment plan is not yet determined. Saw Dr. Fayrene Helper (surgeon) at [facility] and had scans. Now awaiting discussion in tumor board on 7/18 to determine if he is currently a surgical candidate vs need neoadjuvant chemotherapy. She reports he is approved to be out of work until 10/16/20. Wants form completed as much as possible now with records and can then submit update once treatment plan is determined. Completed form and medical records faxed to [phone] with a confirmation of receipt. Patient will be provided his copy of form at next visit.

## 2021-09-14 ENCOUNTER — Other Ambulatory Visit: Payer: Self-pay | Admitting: *Deleted

## 2021-09-15 ENCOUNTER — Inpatient Hospital Stay: Payer: Managed Care, Other (non HMO) | Admitting: Oncology

## 2021-09-15 ENCOUNTER — Ambulatory Visit: Payer: Managed Care, Other (non HMO) | Admitting: Oncology

## 2021-09-15 ENCOUNTER — Telehealth: Payer: Self-pay | Admitting: *Deleted

## 2021-09-15 ENCOUNTER — Other Ambulatory Visit: Payer: Self-pay | Admitting: *Deleted

## 2021-09-15 ENCOUNTER — Inpatient Hospital Stay: Payer: Managed Care, Other (non HMO)

## 2021-09-15 ENCOUNTER — Encounter: Payer: Self-pay | Admitting: *Deleted

## 2021-09-15 VITALS — BP 147/86 | HR 70 | Temp 98.2°F | Resp 16 | Wt 188.0 lb

## 2021-09-15 DIAGNOSIS — C221 Intrahepatic bile duct carcinoma: Secondary | ICD-10-CM

## 2021-09-15 MED ORDER — LORAZEPAM 0.5 MG PO TABS: ORAL_TABLET | ORAL | 0 refills | Status: DC

## 2021-09-15 MED ORDER — LIDOCAINE-PRILOCAINE 2.5-2.5 % EX CREA: 1.0000 | TOPICAL_CREAM | CUTANEOUS | 0 refills | Status: DC | PRN

## 2021-09-15 MED ORDER — PROCHLORPERAZINE MALEATE 10 MG PO TABS: 10.0000 mg | ORAL_TABLET | Freq: Four times a day (QID) | ORAL | 0 refills | Status: DC | PRN

## 2021-09-15 MED ORDER — ONDANSETRON HCL 8 MG PO TABS: 8.0000 mg | ORAL_TABLET | Freq: Three times a day (TID) | ORAL | 0 refills | Status: DC | PRN

## 2021-09-15 MED ORDER — APIXABAN 5 MG PO TABS: 5.0000 mg | ORAL_TABLET | Freq: Two times a day (BID) | ORAL | 3 refills | Status: DC

## 2021-09-15 NOTE — Progress Notes (Signed)
START OFF PATHWAY REGIMEN - Other ° ° °[id number]:Cisplatin IV D1,8 + Durvalumab 1,500 mg IV D1 + Gemcitabine IV D1,8 q21 Days for up to 8 Cycles Followed by Durvalumab 1,500 mg IV D1 q28 Days: °  Cycles 1 through up to 8: A cycle is every 21 days: °    Durvalumab  °    Gemcitabine  °    Cisplatin  °  Cycles 9 and beyond: A cycle is every 28 days: °    Durvalumab  ° °**Always confirm dose/schedule in your pharmacy ordering system** ° °Patient Characteristics: °Intent of Therapy: °Non-Curative / Palliative Intent, Discussed with Patient °

## 2021-09-15 NOTE — Progress Notes (Signed)
[facility] Blev[facility] OFFICE PROGRESS NOTE   Diagnosis: Cholangiocarcinoma  INTERVAL HISTORY:   Jeffrey Blevins returns as scheduled.  He continues to have dark urine and light-colored stool.  He generally feels well.  He had a low-grade fever of 100 degrees.  He reports insomnia.  He saw Dr. Fayrene Helper last week.  He underwent repeat imaging.  His case was presented at [facility] multidisciplinary conference on 09/14/2021.  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  Dr. Fayrene Helper does not recommend surgery since this would require a left trisegmentectomy.  He recommends systemic therapy with gemcitabine/cisplatin/durvalumab.   Objective:  Vital signs in last 24 hours:  Blood pressure (!) 147/86, pulse 70, temperature 98.2 F (36.8 C), temperature source Temporal, resp. rate 16, weight 188 lb (85.3 kg), SpO2 100 %.    HEENT: Mild scleral icterus, no thrush Resp: Lungs clear bilaterally Cardio: Regular rate and rhythm GI: No hepatosplenomegaly, nontender, right upper abdomen biliary drain with a gauze dressing Vascular: No leg edema  Skin: Mild jaundice   Lab Results:  Lab Results  Component Value Date   WBC 7.7 08/25/2021   HGB 10.8 (L) 08/25/2021   HCT 32.7 (L) 08/25/2021   MCV 94.0 08/25/2021   PLT 319 08/25/2021   NEUTROABS 6.5 08/21/2021    CMP  Lab Results  Component Value Date   NA 139 08/24/2021   K 3.6 08/24/2021   CL 108 08/24/2021   CO2 23 08/24/2021   GLUCOSE 102 (H) 08/24/2021   BUN 15 08/24/2021   CREATININE 0.90 08/24/2021   CALCIUM 8.9 08/24/2021   PROT 6.3 (L) 08/24/2021   ALBUMIN 2.9 (L) 08/24/2021   AST 74 (H) 08/24/2021   ALT 121 (H) 08/24/2021   ALKPHOS 152 (H) 08/24/2021   BILITOT 4.9 (H) 08/24/2021   GFRNONAA >60 08/24/2021    Lab Results  Component Value Date   [id number] 76 (H) 08/14/2021     Medications: I have reviewed the patient's current medications.   Assessment/Plan:  Liver mass concerning for malignancy -CT  abdomen/pelvis with contrast 08/14/1998 23-2.9 x 2.7 x 2.7 cm hypoenhancing mass in segment 5 near the liver hilum just above hepatic ductal confluence suspicious for primary bile duct malignancy or primary hepatic malignancy -MRCP 08/14/2021-irregular mass of segment 8 of the liver with perilesional enhancement concerning for cholangiocarcinoma -08/14/2021 CA 19.9 was elevated at 76 -08/16/2021 AFP 6.4 -08/16/2021 cytology from bile duct brushing showed atypical cells -08/19/2021 cytology from bile duct brushing showed cells suspicious for malignancy -08/25/2021 CT-guided liver biopsy-moderately differentiated adenocarcinoma, CK7 positive, CDX2 positive in a small population of cells, TTF-1 negative, CK20 negative-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 -CTs at [facility] 09/06/2021-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  Obstructive jaundice -08/16/2021 percutaneous biliary drain placement 3.  Hepatic steatosis 4.  GERD 5.  Hypertension 6.  Normocytic anemia 7.  Enlarged prostate seen on CT 8.  Tobacco use 9.  Enterococcus and Aerococcus bacteremia 08/20/2021-discharged 08/26/2021 to complete outpatient course of Augmentin 10.  Peripheral neuropathy        Disposition: Jeffrey Blevins has been diagnosed with locally advanced  intrahepatic cholangiocarcinoma.  He was evaluated by the hepatobiliary service at [facility].  Surgery is not recommended due to the left portal vein thrombus and extent of required liver resection.  Dr. Fayrene Helper recommends initial treatment with  systemic therapy.  I recommend gemcitabine/cisplatin and durvalumab.  I reviewed this regimen in detail with Jeffrey Blevins's family.  We discussed the chance of nausea/vomiting, alopecia, infection, bleeding, and hematologic toxicity.  We discussed the fever, rash, and pneumonitis associated with gemcitabine.  We discussed the neuropathy, nephropathy, and nausea seen with cisplatin.  He agrees to proceed.  He attended a chemotherapy teaching class today.  He will be referred for Port-A-Cath placement.  The plan is to begin gemcitabine/cisplatin/durvalumab on 09/16/2021.  He will begin a trial of lorazepam for insomnia.  He will be placed on apixaban anticoagulation for the portal vein thrombosis.  Betsy Coder, MD  09/15/2021  1:15 PM

## 2021-09-15 NOTE — Telephone Encounter (Signed)
Per Dr. Benay Spice: Needs to be on apixaban 5 mg bid due to his portal vein thrombus. Script sent to his [place].  Mr. Obryan notified.

## 2021-09-15 NOTE — Progress Notes (Signed)
PATIENT NAVIGATOR PROGRESS NOTE  Name: Jeffrey Blevins Date: 09/15/2021 MRN: [id number]  DOB: 18-May-1965   Reason for visit:  F/U visit with Dr Benay Spice  Comments:  Met with patient and his wife and sister during visit with Dr Benay Spice He will have PAC placed next Thursday on July 27. Patient to arrive at 1130, NPO after 7 am, must have driver and 24 hour supervision. Verbalized understanding Patient education provided for Durvalumab/Cisplatin and Gemzar, which will begin on 7/28 ( education notes) Reviewed contact information to call with any issues or questions    Time spent counseling/coordinating care: > 60 minutes

## 2021-09-18 ENCOUNTER — Other Ambulatory Visit: Payer: Self-pay | Admitting: Oncology

## 2021-09-20 ENCOUNTER — Other Ambulatory Visit: Payer: Self-pay

## 2021-09-22 ENCOUNTER — Telehealth: Payer: Self-pay

## 2021-09-22 ENCOUNTER — Other Ambulatory Visit: Payer: Self-pay | Admitting: Student

## 2021-09-22 DIAGNOSIS — C221 Intrahepatic bile duct carcinoma: Secondary | ICD-10-CM

## 2021-09-22 NOTE — Telephone Encounter (Signed)
Correspondence from [employer].First dose of Neulasta (H3716) approved by Kennith Gain Case # [id number] for Pt to receive first injection on 10/02/21 in office. TC to [place] [phone]) to confirm the next steps with how Pt will receive injection home v at the office. Spoke with Rep Santiago Glad with [employer] who stated Pt was just approved for the first injection and it will take 5 days before correspondence of what will be approved for the next injections moving forward. Rep stated we will be contacted when decision is made before next injection.

## 2021-09-23 ENCOUNTER — Inpatient Hospital Stay (HOSPITAL_COMMUNITY)
Admission: RE | Admit: 2021-09-23 | Discharge: 2021-09-24 | DRG: 853 | Disposition: A | Discharge status: Home / Self Care | Payer: Managed Care, Other (non HMO) | Source: Ambulatory Visit | Attending: Emergency Medicine | Admitting: Emergency Medicine

## 2021-09-23 ENCOUNTER — Other Ambulatory Visit: Payer: Managed Care, Other (non HMO)

## 2021-09-23 ENCOUNTER — Other Ambulatory Visit: Payer: Self-pay

## 2021-09-23 ENCOUNTER — Other Ambulatory Visit: Payer: Self-pay | Admitting: Oncology

## 2021-09-23 ENCOUNTER — Ambulatory Visit (HOSPITAL_COMMUNITY)
Admission: RE | Admit: 2021-09-23 | Discharge: 2021-09-23 | Disposition: A | Discharge status: Home / Self Care | Payer: Managed Care, Other (non HMO) | Source: Ambulatory Visit | Attending: Oncology | Admitting: Oncology

## 2021-09-23 ENCOUNTER — Encounter (HOSPITAL_COMMUNITY): Payer: Self-pay

## 2021-09-23 ENCOUNTER — Other Ambulatory Visit: Payer: Self-pay | Admitting: Radiology

## 2021-09-23 ENCOUNTER — Other Ambulatory Visit (HOSPITAL_COMMUNITY): Payer: Self-pay | Admitting: Diagnostic Radiology

## 2021-09-23 ENCOUNTER — Ambulatory Visit: Payer: Managed Care, Other (non HMO) | Admitting: Oncology

## 2021-09-23 DIAGNOSIS — K769 Liver disease, unspecified: Secondary | ICD-10-CM | POA: Diagnosis not present

## 2021-09-23 DIAGNOSIS — C221 Intrahepatic bile duct carcinoma: Secondary | ICD-10-CM | POA: Diagnosis present

## 2021-09-23 DIAGNOSIS — Z7901 Long term (current) use of anticoagulants: Secondary | ICD-10-CM

## 2021-09-23 DIAGNOSIS — K760 Fatty (change of) liver, not elsewhere classified: Secondary | ICD-10-CM | POA: Diagnosis present

## 2021-09-23 DIAGNOSIS — G629 Polyneuropathy, unspecified: Secondary | ICD-10-CM | POA: Diagnosis present

## 2021-09-23 DIAGNOSIS — I10 Essential (primary) hypertension: Secondary | ICD-10-CM | POA: Diagnosis present

## 2021-09-23 DIAGNOSIS — Z885 Allergy status to narcotic agent status: Secondary | ICD-10-CM | POA: Diagnosis not present

## 2021-09-23 DIAGNOSIS — K2270 Barrett's esophagus without dysplasia: Secondary | ICD-10-CM | POA: Diagnosis present

## 2021-09-23 DIAGNOSIS — K219 Gastro-esophageal reflux disease without esophagitis: Secondary | ICD-10-CM | POA: Diagnosis present

## 2021-09-23 DIAGNOSIS — G4700 Insomnia, unspecified: Secondary | ICD-10-CM | POA: Diagnosis present

## 2021-09-23 DIAGNOSIS — A419 Sepsis, unspecified organism: Secondary | ICD-10-CM

## 2021-09-23 DIAGNOSIS — F419 Anxiety disorder, unspecified: Secondary | ICD-10-CM | POA: Diagnosis present

## 2021-09-23 DIAGNOSIS — K831 Obstruction of bile duct: Secondary | ICD-10-CM | POA: Diagnosis not present

## 2021-09-23 DIAGNOSIS — I7300 Raynaud's syndrome without gangrene: Secondary | ICD-10-CM | POA: Diagnosis present

## 2021-09-23 DIAGNOSIS — I81 Portal vein thrombosis: Secondary | ICD-10-CM | POA: Diagnosis present

## 2021-09-23 DIAGNOSIS — N429 Disorder of prostate, unspecified: Secondary | ICD-10-CM | POA: Diagnosis not present

## 2021-09-23 DIAGNOSIS — N400 Enlarged prostate without lower urinary tract symptoms: Secondary | ICD-10-CM | POA: Diagnosis present

## 2021-09-23 DIAGNOSIS — R6883 Chills (without fever): Secondary | ICD-10-CM | POA: Diagnosis not present

## 2021-09-23 DIAGNOSIS — A4181 Sepsis due to Enterococcus: Secondary | ICD-10-CM | POA: Diagnosis present

## 2021-09-23 DIAGNOSIS — C220 Liver cell carcinoma: Secondary | ICD-10-CM | POA: Diagnosis not present

## 2021-09-23 DIAGNOSIS — F17210 Nicotine dependence, cigarettes, uncomplicated: Secondary | ICD-10-CM | POA: Diagnosis present

## 2021-09-23 DIAGNOSIS — Z79899 Other long term (current) drug therapy: Secondary | ICD-10-CM | POA: Diagnosis not present

## 2021-09-23 HISTORY — DX: Intrahepatic bile duct carcinoma: C22.1

## 2021-09-23 HISTORY — PX: IR IMAGING GUIDED PORT INSERTION: IMG5740

## 2021-09-23 HISTORY — PX: IR EXCHANGE BILIARY DRAIN: IMG6046

## 2021-09-23 LAB — CBC WITH DIFFERENTIAL/PLATELET
Abs Immature Granulocytes: 0.05 10*3/uL (ref 0.00–0.07)
Basophils Absolute: 0 10*3/uL (ref 0.0–0.1)
Basophils Relative: 0 %
Eosinophils Absolute: 0.2 10*3/uL (ref 0.0–0.5)
Eosinophils Relative: 2 %
HCT: 35 % — ABNORMAL LOW (ref 39.0–52.0)
Hemoglobin: 11.7 g/dL — ABNORMAL LOW (ref 13.0–17.0)
Immature Granulocytes: 0 %
Lymphocytes Relative: 9 %
Lymphs Abs: 1 10*3/uL (ref 0.7–4.0)
MCH: 30.2 pg (ref 26.0–34.0)
MCHC: 33.4 g/dL (ref 30.0–36.0)
MCV: 90.2 fL (ref 80.0–100.0)
Monocytes Absolute: 0.4 10*3/uL (ref 0.1–1.0)
Monocytes Relative: 3 %
Neutro Abs: 9.5 10*3/uL — ABNORMAL HIGH (ref 1.7–7.7)
Neutrophils Relative %: 86 %
Platelets: 282 10*3/uL (ref 150–400)
RBC: 3.88 MIL/uL — ABNORMAL LOW (ref 4.22–5.81)
RDW: 13.6 % (ref 11.5–15.5)
WBC: 11.2 10*3/uL — ABNORMAL HIGH (ref 4.0–10.5)
nRBC: 0 % (ref 0.0–0.2)

## 2021-09-23 LAB — COMPREHENSIVE METABOLIC PANEL
ALT: 88 U/L — ABNORMAL HIGH (ref 0–44)
AST: 51 U/L — ABNORMAL HIGH (ref 15–41)
Albumin: 3.2 g/dL — ABNORMAL LOW (ref 3.5–5.0)
Alkaline Phosphatase: 172 U/L — ABNORMAL HIGH (ref 38–126)
Anion gap: 7 (ref 5–15)
BUN: 12 mg/dL (ref 6–20)
CO2: 24 mmol/L (ref 22–32)
Calcium: 9 mg/dL (ref 8.9–10.3)
Chloride: 105 mmol/L (ref 98–111)
Creatinine, Ser: 0.95 mg/dL (ref 0.61–1.24)
GFR, Estimated: >60 mL/min (ref >60)
Glucose, Bld: 101 mg/dL — ABNORMAL HIGH (ref 70–99)
Potassium: 4.2 mmol/L (ref 3.5–5.1)
Sodium: 136 mmol/L (ref 135–145)
Total Bilirubin: 1.4 mg/dL — ABNORMAL HIGH (ref 0.3–1.2)
Total Protein: 6.8 g/dL (ref 6.5–8.1)

## 2021-09-23 LAB — PROCALCITONIN: Procalcitonin: <0.1 ng/mL

## 2021-09-23 LAB — CK: Total CK: 47 U/L — ABNORMAL LOW (ref 49–397)

## 2021-09-23 LAB — MRSA NEXT GEN BY PCR, NASAL: MRSA by PCR Next Gen: NOT DETECTED

## 2021-09-23 LAB — LACTIC ACID, PLASMA: Lactic Acid, Venous: 1.2 mmol/L (ref 0.5–1.9)

## 2021-09-23 MED ORDER — IOHEXOL 300 MG/ML  SOLN
50.0000 mL | Freq: Once | INTRAMUSCULAR | Status: AC | PRN
  Administered 2021-09-23: 25 mL

## 2021-09-23 MED ORDER — SODIUM CHLORIDE 0.9 % IV SOLN: INTRAVENOUS | Status: DC

## 2021-09-23 MED ORDER — FENTANYL CITRATE (PF) 100 MCG/2ML IJ SOLN
INTRAMUSCULAR | Status: AC | PRN
  Administered 2021-09-23: 50 ug via INTRAVENOUS

## 2021-09-23 MED ORDER — LIDOCAINE-EPINEPHRINE 1 %-1:100000 IJ SOLN
INTRAMUSCULAR | Status: AC
  Filled 2021-09-23: qty 1

## 2021-09-23 MED ORDER — IRBESARTAN 150 MG PO TABS
150.0000 mg | ORAL_TABLET | Freq: Every day | ORAL | Status: DC
  Administered 2021-09-24: 150 mg via ORAL
  Filled 2021-09-23: qty 1

## 2021-09-23 MED ORDER — MIDAZOLAM HCL 2 MG/2ML IJ SOLN
INTRAMUSCULAR | Status: AC
  Filled 2021-09-23: qty 2

## 2021-09-23 MED ORDER — LIDOCAINE-EPINEPHRINE 1 %-1:100000 IJ SOLN
INTRAMUSCULAR | Status: AC | PRN
  Administered 2021-09-23: 10 mL via INTRADERMAL

## 2021-09-23 MED ORDER — PANTOPRAZOLE SODIUM 40 MG PO TBEC
40.0000 mg | DELAYED_RELEASE_TABLET | Freq: Every day | ORAL | Status: DC
  Administered 2021-09-24: 40 mg via ORAL
  Filled 2021-09-23: qty 1

## 2021-09-23 MED ORDER — HEPARIN SOD (PORK) LOCK FLUSH 100 UNIT/ML IV SOLN
INTRAVENOUS | Status: AC
  Filled 2021-09-23: qty 5

## 2021-09-23 MED ORDER — MEPERIDINE HCL 100 MG/ML IJ SOLN
INTRAMUSCULAR | Status: AC | PRN
  Administered 2021-09-23: 25 mg via INTRAVENOUS

## 2021-09-23 MED ORDER — ORAL CARE MOUTH RINSE: 15.0000 mL | OROMUCOSAL | Status: DC | PRN

## 2021-09-23 MED ORDER — AMLODIPINE BESYLATE 10 MG PO TABS
10.0000 mg | ORAL_TABLET | Freq: Every day | ORAL | Status: DC
  Administered 2021-09-24: 10 mg via ORAL
  Filled 2021-09-23: qty 1

## 2021-09-23 MED ORDER — FENTANYL CITRATE (PF) 100 MCG/2ML IJ SOLN
INTRAMUSCULAR | Status: AC
  Filled 2021-09-23: qty 2

## 2021-09-23 MED ORDER — POLYETHYLENE GLYCOL 3350 17 G PO PACK: 17.0000 g | PACK | Freq: Every day | ORAL | Status: DC | PRN

## 2021-09-23 MED ORDER — MIDAZOLAM HCL 2 MG/2ML IJ SOLN
INTRAMUSCULAR | Status: AC | PRN
  Administered 2021-09-23: 1 mg via INTRAVENOUS

## 2021-09-23 MED ORDER — CHLORHEXIDINE GLUCONATE CLOTH 2 % EX PADS
6.0000 | MEDICATED_PAD | Freq: Every day | CUTANEOUS | Status: DC
Start: 2021-09-24 — End: 2021-09-24
  Administered 2021-09-24: 6 via TOPICAL

## 2021-09-23 MED ORDER — CEFAZOLIN SODIUM-DEXTROSE 2-4 GM/100ML-% IV SOLN
INTRAVENOUS | Status: AC
  Administered 2021-09-23: 2 g via INTRAVENOUS
  Filled 2021-09-23: qty 100

## 2021-09-23 MED ORDER — LACTATED RINGERS IV SOLN
INTRAVENOUS | Status: DC
Start: 2021-09-23 — End: 2021-09-24

## 2021-09-23 MED ORDER — FENTANYL CITRATE (PF) 100 MCG/2ML IJ SOLN
INTRAMUSCULAR | Status: AC
  Filled 2021-09-23: qty 4

## 2021-09-23 MED ORDER — LORAZEPAM 0.5 MG PO TABS
0.5000 mg | ORAL_TABLET | Freq: Every day | ORAL | Status: DC
  Administered 2021-09-23: 0.5 mg via ORAL
  Filled 2021-09-23: qty 1

## 2021-09-23 MED ORDER — CEFAZOLIN SODIUM-DEXTROSE 2-4 GM/100ML-% IV SOLN: 2.0000 g | INTRAVENOUS | Status: AC

## 2021-09-23 MED ORDER — APIXABAN 5 MG PO TABS
5.0000 mg | ORAL_TABLET | Freq: Two times a day (BID) | ORAL | Status: DC
Start: 2021-09-23 — End: 2021-09-24
  Administered 2021-09-23 – 2021-09-24 (×2): 5 mg via ORAL
  Filled 2021-09-23 (×2): qty 1

## 2021-09-23 MED ORDER — PIPERACILLIN-TAZOBACTAM 3.375 G IVPB
3.3750 g | Freq: Three times a day (TID) | INTRAVENOUS | Status: DC
  Administered 2021-09-23 – 2021-09-24 (×3): 3.375 g via INTRAVENOUS
  Filled 2021-09-23 (×3): qty 50

## 2021-09-23 MED ORDER — ONDANSETRON HCL 4 MG/2ML IJ SOLN: 4.0000 mg | Freq: Four times a day (QID) | INTRAMUSCULAR | Status: DC | PRN

## 2021-09-23 MED ORDER — MIDAZOLAM HCL 2 MG/2ML IJ SOLN
INTRAMUSCULAR | Status: AC | PRN
  Administered 2021-09-23: 2 mg via INTRAVENOUS

## 2021-09-23 MED ORDER — DOCUSATE SODIUM 100 MG PO CAPS: 100.0000 mg | ORAL_CAPSULE | Freq: Two times a day (BID) | ORAL | Status: DC | PRN

## 2021-09-23 MED ORDER — HEPARIN SOD (PORK) LOCK FLUSH 100 UNIT/ML IV SOLN
INTRAVENOUS | Status: AC | PRN
  Administered 2021-09-23: 500 [IU] via INTRAVENOUS

## 2021-09-23 MED ORDER — OXYCODONE HCL 5 MG PO TABS
5.0000 mg | ORAL_TABLET | Freq: Three times a day (TID) | ORAL | Status: DC | PRN
  Administered 2021-09-23 – 2021-09-24 (×2): 5 mg via ORAL
  Filled 2021-09-23 (×2): qty 1

## 2021-09-23 MED ORDER — LIDOCAINE HCL 1 % IJ SOLN
INTRAMUSCULAR | Status: AC
  Filled 2021-09-23: qty 20

## 2021-09-23 MED ORDER — MEPERIDINE HCL 100 MG/ML IJ SOLN
INTRAMUSCULAR | Status: AC
  Filled 2021-09-23: qty 1

## 2021-09-23 MED ORDER — MIDAZOLAM HCL 2 MG/2ML IJ SOLN
INTRAMUSCULAR | Status: AC
  Filled 2021-09-23: qty 4

## 2021-09-23 NOTE — Procedures (Signed)
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  Procedure: 1) Port placement 2) Biliary drain injection and exchange  Findings: 1) Right jugular port, tip at SVC/ RA junction. 2) Internal/external biliary drain was well positioned but was not draining internally.  Drain was easily exchanged over a wire and new drain was working well with internal drainage.  Soon after the biliary drain exchange, patient started to have leg shaking that progressed and complained of feeling cold.  Demerol was given for rigors and eventually Critical Care was consulted for management and admission.   Complications:  Severe rigors after biliary drain exchange     EBL: Minimal  Plan: Critical care is admitting the patient.      Nirvan Laban R. Anselm Pancoast, MD  Pager: [phone]

## 2021-09-23 NOTE — Sedation Documentation (Signed)
Port a Cath placement complete. Patient being prepped for biliary drain exchange.

## 2021-09-23 NOTE — H&P (Addendum)
Referring Physician(s): Ladell Pier  Supervising Physician: Markus Daft  Patient Status:  [facility] OP  Chief Complaint: cholangiocarcinoma   Subjective: Patient familiar to IR service from [facility] with internal/external biliary drain placement on 08/16/21, bile duct brush biopsy and biliary drain exchange/upsizing on 08/19/2021 and right liver mass biopsy on 08/25/2021.  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  Additional history includes GERD, hiatal hernia, Barrett's esophagus with prior esophageal dilatation, Raynaud's phenomenon, peripheral neuropathy, hypertension, anemia, enlarged prostate, tobacco use, Enterococcus and Aerococcus bacteremia in June 2023, status post completion of Augmentin therapy.  He is currently on Eliquis with last dose last night.  He denies fever, headache, chest pain, dyspnea, cough, worsening abdominal pain, back pain, nausea, vomiting or bleeding.  He does have some intermittent abdominal bloating.  He presents today for follow-up cholangiogram as well as Port-A-Cath placement.  Past Medical History:  Diagnosis Date   GERD (gastroesophageal reflux disease)    Hiatal hernia    History of Barrett's esophagus    per pt dx yrs ago, but with last egd none noted 05/ 2021   History of esophageal dilatation    per pt hx several times last one approx. 2009 then 05/ 2021  for stricture   Hypertension    followed by pcp   Raynauds phenomenon    Right hydrocele    Past Surgical History:  Procedure Laterality Date   COLONOSCOPY WITH  ESOPHAGOGASTRODUODENOSCOPY (EGD)  07/16/2019   last one   INGUINAL HERNIA REPAIR Bilateral    last one 1990s   IR ENDOLUMINAL BX OF BILIARY TREE  08/19/2021   IR EXCHANGE BILIARY DRAIN  08/19/2021   IR INT EXT BILIARY DRAIN WITH CHOLANGIOGRAM  08/16/2021   SPERMATOCELECTOMY Right 10/09/2020   Procedure: SPERMATOCELECTOMY;  Surgeon: Festus Aloe, MD;  Location: [facility];  Service: Urology;  Laterality: Right;      Allergies: Codeine  Medications: Prior to Admission medications   Medication Sig Start Date End Date Taking? Authorizing Provider  acetaminophen (TYLENOL) 500 MG tablet Take 1,000 mg by mouth 3 (three) times daily as needed for headache (pain).   Yes [provider]  amLODipine (NORVASC) 10 MG tablet Take 1 tablet by mouth daily. 04/19/21  Yes [provider]  apixaban (ELIQUIS) 5 MG TABS tablet Take 1 tablet (5 mg total) by mouth 2 (two) times daily. 09/15/21  Yes Ladell Pier, MD  esomeprazole (NEXIUM) 20 MG capsule Take 20 mg by mouth every morning.   Yes [provider]  ibuprofen (ADVIL) 200 MG tablet Take 400 mg by mouth 3 (three) times daily as needed for headache (pain).   Yes [provider]  irbesartan (AVAPRO) 150 MG tablet Take 150 mg by mouth daily.   Yes [provider]  LORazepam (ATIVAN) 0.5 MG tablet Take 1 tablet as needed for insomnia 09/15/21  Yes Ladell Pier, MD  lidocaine-prilocaine (EMLA) cream Apply 1 Application topically as needed. 09/15/21   Ladell Pier, MD  ondansetron (ZOFRAN) 8 MG tablet Take 1 tablet (8 mg total) by mouth every 8 (eight) hours  as needed for nausea or vomiting (Starting day 4 after chemo as needed). 09/15/21   Ladell Pier, MD  prochlorperazine (COMPAZINE) 10 MG tablet Take 1 tablet (10 mg total) by mouth every 6 (six) hours as needed for nausea or vomiting. 09/15/21   Ladell Pier, MD     Vital Signs: BP 136/83 (BP Location: Left Arm)   Pulse 64    Temp 98.3 F (36.8 C) (Oral)   Resp 15   Ht 6' 2"  (1.88 m)   Wt 187 lb 13.3 oz (85.2 kg)   SpO2 100%   BMI 24.12 kg/m   Physical Exam awake, alert.  Trace scleral icterus.  Chest with clear but distant breath sounds bilaterally.  Heart with regular rate and rhythm.  Abdomen soft, positive bowel sounds, intact rt I/E biliary with dark bile in bag; no LE edema  Imaging: No results found.  Labs:  CBC: Recent Labs    08/20/21 0551 08/21/21 0539 08/23/21 0517 08/25/21 0524  WBC 15.3* 7.9 6.3 7.7  HGB 11.2* 10.3* 10.7* 10.8*  HCT 33.4* 30.6* 32.0* 32.7*  PLT 300 263 294 319    COAGS: Recent Labs    08/13/21 1945 08/16/21 0437 08/19/21 0536 08/25/21 0524  INR 0.9 0.8 0.9 1.1    BMP: Recent Labs    08/21/21 0539 08/22/21 0532 08/23/21 0517 08/24/21 0509  NA 135 139 138 139  K 3.2* 3.2* 3.9 3.6  CL 101 106 106 108  CO2 25 25 24 23   GLUCOSE 143* 105* 112* 102*  BUN 17 14 16 15   CALCIUM 8.7* 8.9 8.7* 8.9  CREATININE 0.83 1.08 0.93 0.90  GFRNONAA >60 >60 >60 >60    LIVER FUNCTION TESTS: Recent Labs    08/21/21 0539 08/22/21 0532 08/23/21 0517 08/24/21 0509  BILITOT 9.6* 8.2* 6.1* 4.9*  AST 66* 69* 64* 74*  ALT 124* 120* 117* 121*  ALKPHOS 165* 157* 148* 152*  PROT 6.2* 6.4* 6.5 6.3*  ALBUMIN 2.8* 2.8* 2.8* 2.9*    Assessment and Plan: Patient familiar to IR service from [facility] with internal/external biliary drain placement on 08/16/21, bile duct brush biopsy and biliary drain exchange/upsizing on 08/19/2021 and right liver mass biopsy on 08/25/2021.  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  Additional history includes GERD, hiatal hernia, Barrett's esophagus with prior esophageal dilatation, Raynaud's phenomenon, peripheral neuropathy, hypertension, anemia, enlarged prostate, tobacco use, Enterococcus and Aerococcus bacteremia in June 2023, status post completion of Augmentin therapy.  He is currently on Eliquis with last dose last night.  He denies fever, headache, chest pain, dyspnea, cough, worsening abdominal pain, back pain, nausea, vomiting or bleeding.  He does have some intermittent abdominal bloating.  He presents today for follow-up cholangiogram as well as Port-A-Cath placement.  Details/risks of procedures, including but not limited to, internal bleeding, infection, injury to adjacent structures discussed with patient with his understanding and consent.   Electronically Signed: D. Rowe Robert, PA-C 09/23/2021, 12:25 PM   I spent a total of 25 Minutes at the the patient's bedside AND on the patient's hospital floor or unit, greater  than 50% of which was counseling/coordinating care for cholangiogram and Port-A-Cath placement

## 2021-09-23 NOTE — Progress Notes (Signed)
Pharmacy Antibiotic Note  Jeffrey Blevins is a 56 y.o. 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  During his hospital admission 08/13/21 - 08/26/21, he had enterococcus faecalis and aerococcus viridans noted in bile culture collected on 6/23. He was treated with zosyn/unasyn and discharged on augmentin for two weeks. He presented to [facility] on 09/23/2021 for Port-a-cath placement  and biliary drain exchange.  Pharmacy has been consulted to dose zosyn for broad coverage.   Plan: - zosyn 3.375 gm IV q8h (infuse over 4 hrs) - With good renal function, pharmacy will sign off for abx consult.  Reconsult [place] if need further assistance.  _______________________________________  Height: '6\' 2"'$  (188 cm) Weight: 85.2 kg (187 lb 13.3 oz) IBW/kg (Calculated) : 82.2  Temp (24hrs), Avg:98.3 F (36.8 C), Min:98.3 F (36.8 C), Max:98.3 F (36.8 C)  No results for input(s): "WBC", "CREATININE", "LATICACIDVEN", "VANCOTROUGH", "VANCOPEAK", "VANCORANDOM", "GENTTROUGH", "GENTPEAK", "GENTRANDOM", "TOBRATROUGH", "TOBRAPEAK", "TOBRARND", "AMIKACINPEAK", "AMIKACINTROU", "AMIKACIN" in the last 168 hours.  CrCl cannot be calculated (Patient's most recent lab result is older than the maximum 21 days allowed.).    Allergies  Allergen Reactions   Codeine Nausea And Vomiting and Rash     Thank you for allowing pharmacy to be a part of this patient's care.  Lynelle Doctor 09/23/2021 3:46 PM

## 2021-09-23 NOTE — Discharge Instructions (Signed)
For questions /concerns may call Interventional Radiology at [phone] or  Interventional Radiology clinic [phone]   You may remove your dressing and shower tomorrow afternoon  DO NOT use EMLA cream for 2 weeks after port placement as the cream will remove surgical glue on your incision.     Moderate Conscious Sedation, Adult, Care After This sheet gives you information about how to care for yourself after your procedure. Your health care provider may also give you more specific instructions. If you have problems or questions, contact your health care provider. What can I expect after the procedure? After the procedure, it is common to have: Sleepiness for several hours. Impaired judgment for several hours. Difficulty with balance. Vomiting if you eat too soon. Follow these instructions at home: For the time period you were told by your health care provider:     Rest. Do not participate in activities where you could fall or become injured. Do not drive or use machinery. Do not drink alcohol. Do not take sleeping pills or medicines that cause drowsiness. Do not make important decisions or sign legal documents. Do not take care of children on your own. Eating and drinking  Follow the diet recommended by your health care provider. Drink enough fluid to keep your urine pale yellow. If you vomit: Drink water, juice, or soup when you can drink without vomiting. Make sure you have little or no nausea before eating solid foods. General instructions Take over-the-counter and prescription medicines only as told by your health care provider. Have a responsible adult stay with you for the time you are told. It is important to have someone help care for you until you are awake and alert. Do not smoke. Keep all follow-up visits as told by your health care provider. This is important. Contact a health care provider if: You are still sleepy or having trouble with balance after 24  hours. You feel light-headed. You keep feeling nauseous or you keep vomiting. You develop a rash. You have a fever. You have redness or swelling around the IV site. Get help right away if: You have trouble breathing. You have new-onset confusion at home. Summary After the procedure, it is common to feel sleepy, have impaired judgment, or feel nauseous if you eat too soon. Rest after you get home. Know the things you should not do after the procedure. Follow the diet recommended by your health care provider and drink enough fluid to keep your urine pale yellow. Get help right away if you have trouble breathing or new-onset confusion at home. This information is not intended to replace advice given to you by your health care provider. Make sure you discuss any questions you have with your health care provider. Document Revised: 06/14/2019 Document Reviewed: 01/10/2019 Elsevier Patient Education  [place] Insertion, Care After The following information offers guidance on how to care for yourself after your procedure. Your health care provider may also give you more specific instructions. If you have problems or questions, contact your health care provider.  Urgent needs- Interventional Radiology clinic- [phone] Wound- May remove dressing in 24-48 hours and shower. Otherwise keep site clean and dry. May replace dressing with clean bandaids as needed.  Your Provider should set up monthly appointments for Port flush.  What can I expect after the procedure? After the procedure, it is common to have: Discomfort at the port insertion site. Bruising on the skin over the port. This should  improve over 3-4 days. Follow these instructions at home: [facility] care After your port is placed, you will get a manufacturer's information card. The card has information about your port. Keep this card with you at all times. Take care of the port as told by your health care  provider. Ask your health care provider if you or a family member can get training for taking care of the port at home. A home health care nurse will be be available to help care for the port. Make sure to remember what type of port you have. Incision care     Follow instructions from your health care provider about how to take care of your port insertion site. Make sure you: Wash your hands with soap and water for at least 20 seconds before and after you change your bandage (dressing). If soap and water are not available, use hand sanitizer. Change your dressing as told by your health care provider. Leave stitches (sutures), skin glue, or adhesive strips in place. These skin closures may need to stay in place for 2 weeks or longer. If adhesive strip edges start to loosen and curl up, you may trim the loose edges. Do not remove adhesive strips completely unless your health care provider tells you to do that. Check your port insertion site every day for signs of infection. Check for: Redness, swelling, or pain. Fluid or blood. Warmth. Pus or a bad smell. Activity Return to your normal activities as told by your health care provider. Ask your health care provider what activities are safe for you. You may have to avoid lifting. Ask your health care provider how much you can safely lift. General instructions Take over-the-counter and prescription medicines only as told by your health care provider. Do not take baths, swim, or use a hot tub until site healed. Ask your health care provider if you may take showers. You may only be allowed to take sponge baths. If you were given a sedative during the procedure, it can affect you for several hours. Do not drive or operate machinery until your health care provider says that it is safe. Wear a medical alert bracelet in case of an emergency. This will tell any health care providers that you have a port. Keep all follow-up visits. This is  important. Contact a health care provider if: You cannot flush your port with saline as directed, or you cannot draw blood from the port. You have a fever or chills. You have redness, swelling, or pain around your port insertion site. You have fluid or blood coming from your port insertion site. Your port insertion site feels warm to the touch. You have pus or a bad smell coming from the port insertion site. Get help right away if: You have chest pain or shortness of breath. You have bleeding from your port that you cannot control. These symptoms may be an emergency. Get help right away. Call 911. Do not wait to see if the symptoms will go away. Do not drive yourself to the hospital. Summary Take care of the port as told by your health care provider. Keep the manufacturer's information card with you at all times. Change your dressing as told by your health care provider. Contact a health care provider if you have a fever or chills or if you have redness, swelling, or pain around your port insertion site. Keep all follow-up visits. This information is not intended to replace advice given to you by your health care provider.  Make sure you discuss any questions you have with your health care provider. Document Revised: 08/18/2020 Document Reviewed: 08/18/2020 Elsevier Patient Education  [place].

## 2021-09-23 NOTE — H&P (Addendum)
NAME:  Jeffrey Blevins, MRN:  [id number], DOB:  01-Sep-1965, LOS: 0 ADMISSION DATE:  09/23/2021, CONSULTATION DATE:  09/23/21 REFERRING MD:  Dr. Anselm Pancoast, CHIEF COMPLAINT:  Rigors   History of Present Illness:  56 y/o M who presented to [facility] IR for planned port a cath placement and biliary drain exchange.   The patient was recently admitted from 08/13/21-08/26/21 for obstructive painless jaundice.  He was seen by his PCP with elevated LFT's.  CT of the abd/pelvis showed a 2.9 x 2.7 x 2.7 echogenic mass near liver hilum with thickened CBD.  MRCP showed an irregular mass of the liver which enhanced gradually & diffuse intrahepatic ductal dilation with wall thickening of the CBD / gallbladder.  He was evaluated by IR and had a cholangiogram with placement of right biliary drain.  Brushings and cytology were inconclusive.  The drain was exchanged on 6/22 due to persistent intrahepatic dilation.  Culture from biliary drain grew enterococcus faecalis and viridans which were pan sensitive to ampicillin.  He was discharged on Augmentin.  His blood cultures were negative at that time. 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 Post discharge, he had fever to 102 at home but took tylenol with improvement.  He followed up with Dr. Benay Spice.  His case was discussed in the GI Tumor Conference and the surgical oncologist felt the tumor could be resectable, referral to Dr. Fayrene Helper at [facility].  He was reviewed at [place] and felt he will require a left trisectionectomy.  Plan was for 4 months of gem/cis/durva followed by surgical resection.    He presented as an outpatient on 7/27 for planned placement of port-a-cath and biliary drain exchange.  The port was placed in the right jugular with tip at the [place].  On  IR assessment, the biliary drain was well positioned but not draining internally.  The drain was exchanged over a wire with good internal drainage.  Thereafter, he had shaking rigors and felt cold.  He was given demerol for rigors.  BP was stable to hypertensive, HR elevated and no change in saturations or sensorium.    PCCM consulted for hospital admission.  On arrival to ICU, he did have fever of 101.3. HR/BP stable. Patient reported feeling improved.   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  Followed by Dr. Benay Spice. Pending initiation of chemotherapy.  GERD  Esophageal Dilation  Barrett's Esophagus  Hepatic Steatosis  Tobacco Abuse  HTN  Right Hydrocele, Spermatocele   Significant Hospital Events: Including procedures, antibiotic start and stop dates in addition to other pertinent events   7/27 Admit post IR drain replacement / port a cath with rigors, SIRS response   Interim History / Subjective:  As above   Objective   Blood pressure (!) 130/96, pulse (!) 115, temperature 98.3 F (36.8 C), temperature source Oral, resp. rate 13, height '6\' 2"'$  (1.88 m), weight 85.2 kg, SpO2 100 %.        Intake/Output Summary (Last 24 hours) at 09/23/2021 1634 Last data filed at 09/23/2021 1603 Gross per 24 hour  Intake 157.74 ml  Output 100 ml  Net 57.74 ml   Filed Weights   09/23/21 1158  Weight: 85.2 kg    Examination: General: adult male lying on stretcher in NAD  HENT: MM pink/moist, anicteric, pupils 43m Lungs: non-labored at rest, lungs bilaterally with good air entry, distant Cardiovascular: S1S2 RRR, ST on monitor, no m/r/g Abdomen: non-distended, non-tender, bsx4 active, biliary drain with golden brown drainage Extremities: warm/dry, no edema  Neuro: AAOx4, speech clear, MAE  Resolved Hospital Problem list     Assessment & Plan:   SIRS s/p Biliary Drain Replacement  Rigors, fever post  biliary drain exchange.  Prior biliary drainage grew enterococcus faecalis and viridans which were pan sensitive to ampicillin.  -admit to ICU for observation  -obtain blood cultures -culture biliary drain -empiric zosyn for now, received ampicillin pre-procedure  -monitor biliary drainage / output  -assess CBC with differential, CMP, PCT, Lactic Acid, CK -tele monitoring  -LR at 476mhr overnight  Cholangiocarcinoma  Left Portal Vein Thrombosis Followed by Dr. ShBenay Spiceocally & [facility]. Most recent plan was to begin therapy and consider resection in 4 months.  -Dr. ShBenay Spiceotified of admission, patient was to start chemo on 7/28 at 0700 -continue home eliquis -follow up with ONC as outpatient   HTN -resume home antihypertensives in am   Hx GERD, Barrett's Esophagus, Esophageal Dilatation  -continue PPI (protonix in lieu of nexium) -diet as tolerated   Insomnia  -continue home PRN ativan  Tobacco Abuse  -smoking cessation counseling   Best Practice (right click and "Reselect all SmartList Selections" daily)  Diet/type: Regular consistency (see orders) DVT prophylaxis: DOAC GI prophylaxis: N/A Lines: N/A Foley:  N/A Code Status:  full code Last date of multidisciplinary goals of care discussion: Patient updated on plan of care 7/27.  Will update wife on arrival. She was updated per Dr. HeAnselm Pancoastost procedure.   Labs   CBC: Recent Labs  Lab 09/23/21 1539  WBC 11.2*  NEUTROABS 9.5*  HGB 11.7*  HCT 35.0*  MCV 90.2  PLT 28614  Basic Metabolic Panel: Recent Labs  Lab 09/23/21 1539  NA 136  K 4.2  CL 105  CO2 24  GLUCOSE 101*  BUN 12  CREATININE 0.95  CALCIUM 9.0   GFR: Estimated Creatinine Clearance: 100.9 mL/min (by C-G formula based on SCr of 0.95 mg/dL). Recent Labs  Lab 09/23/21 1539  WBC 11.2*  LATICACIDVEN 1.2    Liver Function Tests: Recent Labs  Lab 09/23/21 1539  AST 51*  ALT 88*  ALKPHOS 172*   BILITOT 1.4*  PROT 6.8  ALBUMIN 3.2*   No results for input(s): "LIPASE", "AMYLASE" in the last 168 hours. No results for input(s): "AMMONIA" in the last 168 hours.  ABG    Component Value Date/Time   TCO2 24 10/09/2020 1041     Coagulation Profile: No results for input(s): "INR", "PROTIME" in the last 168 hours.  Cardiac Enzymes: Recent Labs  Lab 09/23/21 1539  CKTOTAL 47*    HbA1C: No results found for: "HGBA1C"  CBG: No results for input(s): "GLUCAP" in the last 168 hours.  Review of Systems:   Gen: Denies fever, chills, rigors, weight change, fatigue, night sweats HEENT: Denies blurred vision, double vision, hearing loss, tinnitus, sinus congestion, rhinorrhea, sore throat, neck stiffness, dysphagia PULM: Denies shortness of breath, cough, sputum production, hemoptysis, wheezing CV: Denies chest pain, edema, orthopnea, paroxysmal nocturnal dyspnea, palpitations GI: Denies abdominal pain, nausea, vomiting, diarrhea, hematochezia, melena, constipation, change in bowel habits GU: Denies dysuria, hematuria, polyuria, oliguria, urethral discharge Endocrine: Denies hot or cold intolerance, polyuria, polyphagia or appetite change Derm: Denies rash, dry skin, scaling or peeling  skin change Heme: Denies easy bruising, bleeding, bleeding gums Neuro: Denies headache, numbness, weakness, slurred speech, loss of memory or consciousness  Past Medical History:  He,  has a past medical history of Cholangiocarcinoma ([place]), GERD (gastroesophageal reflux disease), Hiatal hernia, History of Barrett's esophagus, History of esophageal dilatation, Hypertension, Raynauds phenomenon, and Right hydrocele.   Surgical History:   Past Surgical History:  Procedure Laterality Date   COLONOSCOPY WITH ESOPHAGOGASTRODUODENOSCOPY (EGD)  07/16/2019   last one   INGUINAL HERNIA REPAIR Bilateral    last one 1990s   IR ENDOLUMINAL BX OF BILIARY TREE  08/19/2021   IR EXCHANGE BILIARY DRAIN   08/19/2021   IR EXCHANGE BILIARY DRAIN  09/23/2021   IR IMAGING GUIDED PORT INSERTION  09/23/2021   IR INT EXT BILIARY DRAIN WITH CHOLANGIOGRAM  08/16/2021   SPERMATOCELECTOMY Right 10/09/2020   Procedure: SPERMATOCELECTOMY;  Surgeon: Festus Aloe, MD;  Location: [facility];  Service: Urology;  Laterality: Right;     Social History:   reports that he has been smoking cigarettes. He has never used smokeless tobacco. He reports current alcohol use of about 4.0 standard drinks of alcohol per week. He reports that he does not use drugs.   Family History:  His family history includes Breast cancer in his mother and sister; Cardiomyopathy in his brother; Stroke in his father.   Allergies Allergies  Allergen Reactions   Codeine Nausea And Vomiting and Rash     Home Medications  Prior to Admission medications   Medication Sig Start Date End Date Taking? Authorizing Provider  acetaminophen (TYLENOL) 500 MG tablet Take 1,000 mg by mouth 3 (three) times daily as needed for headache (pain).   Yes [provider]  amLODipine (NORVASC) 10 MG tablet Take 1 tablet by mouth daily. 04/19/21  Yes [provider]  apixaban (ELIQUIS) 5 MG TABS tablet Take 1 tablet (5 mg total) by mouth 2 (two) times daily. 09/15/21  Yes Ladell Pier, MD  esomeprazole (NEXIUM) 20 MG capsule Take 20 mg by mouth every morning.   Yes [provider]  ibuprofen (ADVIL) 200 MG tablet Take 400 mg by mouth 3 (three) times daily as needed for headache (pain).   Yes [provider]  irbesartan (AVAPRO) 150 MG tablet Take 150 mg by mouth daily.   Yes [provider]  LORazepam (ATIVAN) 0.5 MG tablet Take 1 tablet as needed for insomnia 09/15/21  Yes Ladell Pier, MD  lidocaine-prilocaine (EMLA) cream Apply 1 Application topically as needed. 09/15/21   Ladell Pier, MD  ondansetron (ZOFRAN) 8 MG tablet Take 1 tablet (8 mg total) by mouth every 8 (eight) hours as  needed for nausea or vomiting (Starting day 4 after chemo as needed). 09/15/21   Ladell Pier, MD  prochlorperazine (COMPAZINE) 10 MG tablet Take 1 tablet (10 mg total) by mouth every 6 (six) hours as needed for nausea or vomiting. 09/15/21   Ladell Pier, MD     Critical care time: 88 minutes     Noe Gens, MSN, APRN, NP-C, AGACNP-BC [facility] 09/23/2021, 4:34 PM   Please see [email or web address] for pager details.   From 7A-7P if no response, please call [phone] After hours, please call ELink [phone]

## 2021-09-23 NOTE — Plan of Care (Signed)
  Problem: Education: Goal: Knowledge of General Education information will improve Description: Including pain rating scale, medication(s)/side effects and non-pharmacologic comfort measures Outcome: Progressing   Problem: Health Behavior/Discharge Planning: Goal: Ability to manage health-related needs will improve Outcome: Progressing   Problem: Activity: Goal: Risk for activity intolerance will decrease Outcome: Progressing   Problem: Nutrition: Goal: Adequate nutrition will be maintained Outcome: Progressing   Problem: Education: Goal: Knowledge of the prescribed therapeutic regimen will improve Outcome: Progressing

## 2021-09-24 ENCOUNTER — Inpatient Hospital Stay: Payer: Managed Care, Other (non HMO) | Admitting: Oncology

## 2021-09-24 ENCOUNTER — Inpatient Hospital Stay: Payer: Managed Care, Other (non HMO)

## 2021-09-24 ENCOUNTER — Other Ambulatory Visit: Payer: Self-pay

## 2021-09-24 DIAGNOSIS — K831 Obstruction of bile duct: Secondary | ICD-10-CM

## 2021-09-24 DIAGNOSIS — N429 Disorder of prostate, unspecified: Secondary | ICD-10-CM

## 2021-09-24 DIAGNOSIS — K769 Liver disease, unspecified: Secondary | ICD-10-CM

## 2021-09-24 DIAGNOSIS — A419 Sepsis, unspecified organism: Secondary | ICD-10-CM | POA: Diagnosis not present

## 2021-09-24 DIAGNOSIS — R6883 Chills (without fever): Secondary | ICD-10-CM

## 2021-09-24 DIAGNOSIS — C220 Liver cell carcinoma: Secondary | ICD-10-CM

## 2021-09-24 LAB — BASIC METABOLIC PANEL
Anion gap: 11 (ref 5–15)
BUN: 13 mg/dL (ref 6–20)
CO2: 24 mmol/L (ref 22–32)
Calcium: 9.1 mg/dL (ref 8.9–10.3)
Chloride: 101 mmol/L (ref 98–111)
Creatinine, Ser: 1.05 mg/dL (ref 0.61–1.24)
GFR, Estimated: >60 mL/min (ref >60)
Glucose, Bld: 105 mg/dL — ABNORMAL HIGH (ref 70–99)
Potassium: 4.6 mmol/L (ref 3.5–5.1)
Sodium: 136 mmol/L (ref 135–145)

## 2021-09-24 LAB — CBC
HCT: 35.6 % — ABNORMAL LOW (ref 39.0–52.0)
Hemoglobin: 12 g/dL — ABNORMAL LOW (ref 13.0–17.0)
MCH: 30.2 pg (ref 26.0–34.0)
MCHC: 33.7 g/dL (ref 30.0–36.0)
MCV: 89.4 fL (ref 80.0–100.0)
Platelets: 256 10*3/uL (ref 150–400)
RBC: 3.98 MIL/uL — ABNORMAL LOW (ref 4.22–5.81)
RDW: 13.3 % (ref 11.5–15.5)
WBC: 9.1 10*3/uL (ref 4.0–10.5)
nRBC: 0 % (ref 0.0–0.2)

## 2021-09-24 MED ORDER — AMOXICILLIN-POT CLAVULANATE 875-125 MG PO TABS: 1.0000 | ORAL_TABLET | Freq: Two times a day (BID) | ORAL | 0 refills | Status: AC

## 2021-09-24 NOTE — Progress Notes (Signed)
Patient ID: SARON TWEED, male   DOB: [birth year], 56 y.o.   MRN: [id number] Pt feeling better this am; s/p port placement and I/E biliary drain exchange on 7/27 (hx cholangiocarcinoma with bil obst) with subsequent rigors/tachycardia;  currently has only intermittent mild RUQ discomfort; no further fever/chills; vitals ok; WBC nl, hgb 12(11.7), creat nl; blood cx neg to date, bile cx with few gram + rods; biliary drain intact, insertion site ok, OP 1.5 liters yesterday, 675 cc today; port site rt chest clean and dry; rec that pt flush biliary drain with 5 cc sterile saline once daily, record output and change bandage every 2-3 days; stay well hydrated to account for any biliary electrolyte losses;  f/u appt sheet for drain exchange in September given to pt; phone number to call with questions given to pt; greatly appreciate CCM care of pt. To be d/c'd home on antbx .

## 2021-09-24 NOTE — Progress Notes (Signed)
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  He underwent Port-A-Cath placement and exchange of the biliary drain yesterday.  He developed rigors at the end of the procedure.  He was admitted to the ICU by the critical care service. Jeffrey Blevins reports feeling well prior to the procedure yesterday.  No further rigors.  He has mild soreness at the right upper quadrant drain site and Port-A-Cath site.  No other complaint.  Objective: Vital signs in last 24 hours: Blood pressure 122/79, pulse 76, temperature 98.6 F (37 C), temperature source Oral, resp. rate 18, height '6\' 2"'$  (1.88 m), weight 185 lb 10 oz (84.2 kg), SpO2 94 %.  Intake/Output from previous day: 07/27 0701 - 07/28 0700 In: 971.7 [I.V.:781; IV Piggyback:190.7] Out: 3100 [Urine:1600; Drains:1500]  Physical Exam:  HEENT: No thrush Lungs: Clear bilaterally Cardiac: Regular rate and rhythm Abdomen: Soft and nontender, right upper quadrant biliary drain site without erythema Extremities: No leg edema  Portacath/PICC-gauze dressing  Lab Results: Recent Labs    09/23/21 1539 09/24/21 0310  WBC 11.2* 9.1  HGB 11.7* 12.0*  HCT 35.0* 35.6*  PLT 282 256    BMET Recent Labs    09/23/21 1539 09/24/21 0310  NA 136 136  K 4.2 4.6  CL 105 101  CO2 24 24  GLUCOSE 101* 105*  BUN 12 13  CREATININE 0.95 1.05  CALCIUM 9.0 9.1    Lab Results  Component Value Date   CAN199 76 (H) 08/14/2021    Studies/Results: IR IMAGING GUIDED PORT INSERTION  Result Date: 09/23/2021 INDICATION: 56 year old with cholangiocarcinoma. Patient needs a Port-A-Cath for chemotherapy. Patient also has an internal/external biliary drain and presents for drain evaluation and possible exchange. EXAM: FLUOROSCOPIC AND ULTRASOUND GUIDED PLACEMENT OF A SUBCUTANEOUS PORT BILIARY DRAIN INJECTION AND EXCHANGE WITH FLUOROSCOPY COMPARISON:  None Available. MEDICATIONS: Ancef 2 g, Demerol 50 mg  ANESTHESIA/SEDATION: Moderate (conscious) sedation was employed during this procedure. A total of Versed 8.0 mg and fentanyl 300 mcg was administered intravenously at the order of the provider performing the procedure. Total intra-service moderate sedation time: 39 minutes. Patient's level of consciousness and vital signs were monitored continuously by radiology nurse throughout the procedure under the supervision of the provider performing the procedure. FLUOROSCOPY TIME:  Radiation Exposure Index (as provided by the fluoroscopic device): 41 mGy Kerma COMPLICATIONS: None immediate. PROCEDURE: The procedure, risks, benefits, and alternatives were explained to the patient. Questions regarding the procedure were encouraged and answered. The patient understands and consents to the procedure. Patient was placed supine on the interventional table. Biliary drain was injected with contrast. Based on the cholangiogram, patient needed a biliary drain exchange. Ultrasound confirmed a patent right internal jugular vein. Ultrasound image was saved for documentation. The right chest and neck were cleaned with a skin antiseptic and a sterile drape was placed. The right side of the abdomen and existing biliary catheter were also prepped and draped in sterile fashion. Maximal barrier sterile technique was utilized including caps, mask, sterile gowns, sterile gloves, sterile drape, hand hygiene and skin antiseptic. The right neck was anesthetized with 1% lidocaine. Small incision was made in the right neck with a blade. Micropuncture set was placed in the right internal jugular vein with ultrasound guidance. The micropuncture wire was used for measurement purposes. The right chest was anesthetized with 1% lidocaine with epinephrine. #15 blade was used to make an incision and a subcutaneous port pocket was formed. Jeffrey Blevins  was assembled. Subcutaneous tunnel was formed with a stiff tunneling device. The port catheter was  brought through the subcutaneous tunnel. The port was placed in the subcutaneous pocket. The micropuncture set was exchanged for a peel-away sheath. The catheter was placed through the peel-away sheath and the tip was positioned at the superior cavoatrial junction. Catheter placement was confirmed with fluoroscopy. The port was accessed and flushed with heparinized saline. The port pocket was closed using two layers of absorbable sutures and Dermabond. The vein skin site was closed using a single layer of absorbable suture and Dermabond. Sterile dressings were applied. Attention was directed to the biliary drain. The retention suture was cut. The drain was cut and a stiff glide wire was easily advanced into the duodenum. The drain was removed over the wire. A new 12 [place] biliary drain was easily advanced over the wire. Pigtail catheter was formed in the duodenum. Drain injection was performed. The drain was patent and there was a filling of the common bile duct. As a result, the drain was flushed with saline and capped. Drain was sutured to skin. At this point, the patient started to complaining of leg shaking. Leg shaking progressed significantly and then the patient started complaining of chills. At this point, the patient became very tachycardic and hypertensive. Patient was given IV Demerol for rigors. The drain was also uncapped and attached to a gravity bag. The severe shaking persisted and Critical Care was consulted. Patient was admitted to Critical Care for management of the rigors. Ultrasound and fluoroscopic images were taken and saved for this procedure. FINDINGS: Right jugular Port-A-Cath is well positioned with the tip at the superior cavoatrial junction. Initial cholangiogram images demonstrated that the internal/external biliary drain was well positioned but there was no contrast draining into the common bile duct or duodenum. There was filling of the intrahepatic biliary system without significant  dilatation. Following the drain exchange, the biliary drain was patent and contrast was draining into the duodenum. IMPRESSION: 1. Placement of a subcutaneous power-injectable port device. Catheter tip at the superior cavoatrial junction. 2. Biliary drain was exchanged because it was not draining internally. Biliary drain was successfully exchanged but the patient developed rigors with severe shaking immediately after the procedure. As a result, the patient was admitted to the hospital by Critical Care. Electronically Signed   By: Markus Daft M.D.   On: 09/23/2021 15:42   IR EXCHANGE BILIARY DRAIN  Result Date: 09/23/2021 INDICATION: 56 year old with cholangiocarcinoma. Patient needs a Port-A-Cath for chemotherapy. Patient also has an internal/external biliary drain and presents for drain evaluation and possible exchange. EXAM: FLUOROSCOPIC AND ULTRASOUND GUIDED PLACEMENT OF A SUBCUTANEOUS PORT BILIARY DRAIN INJECTION AND EXCHANGE WITH FLUOROSCOPY COMPARISON:  None Available. MEDICATIONS: Ancef 2 g, Demerol 50 mg ANESTHESIA/SEDATION: Moderate (conscious) sedation was employed during this procedure. A total of Versed 8.0 mg and fentanyl 300 mcg was administered intravenously at the order of the provider performing the procedure. Total intra-service moderate sedation time: 39 minutes. Patient's level of consciousness and vital signs were monitored continuously by radiology nurse throughout the procedure under the supervision of the provider performing the procedure. FLUOROSCOPY TIME:  Radiation Exposure Index (as provided by the fluoroscopic device): 41 mGy Kerma COMPLICATIONS: None immediate. PROCEDURE: The procedure, risks, benefits, and alternatives were explained to the patient. Questions regarding the procedure were encouraged and answered. The patient understands and consents to the procedure. Patient was placed supine on the interventional table. Biliary drain was injected with contrast. Based on the  cholangiogram, patient needed a biliary drain exchange. Ultrasound confirmed a patent right internal jugular vein. Ultrasound image was saved for documentation. The right chest and neck were cleaned with a skin antiseptic and a sterile drape was placed. The right side of the abdomen and existing biliary catheter were also prepped and draped in sterile fashion. Maximal barrier sterile technique was utilized including caps, mask, sterile gowns, sterile gloves, sterile drape, hand hygiene and skin antiseptic. The right neck was anesthetized with 1% lidocaine. Small incision was made in the right neck with a blade. Micropuncture set was placed in the right internal jugular vein with ultrasound guidance. The micropuncture wire was used for measurement purposes. The right chest was anesthetized with 1% lidocaine with epinephrine. #15 blade was used to make an incision and a subcutaneous port pocket was formed. [place] was assembled. Subcutaneous tunnel was formed with a stiff tunneling device. The port catheter was brought through the subcutaneous tunnel. The port was placed in the subcutaneous pocket. The micropuncture set was exchanged for a peel-away sheath. The catheter was placed through the peel-away sheath and the tip was positioned at the superior cavoatrial junction. Catheter placement was confirmed with fluoroscopy. The port was accessed and flushed with heparinized saline. The port pocket was closed using two layers of absorbable sutures and Dermabond. The vein skin site was closed using a single layer of absorbable suture and Dermabond. Sterile dressings were applied. Attention was directed to the biliary drain. The retention suture was cut. The drain was cut and a stiff glide wire was easily advanced into the duodenum. The drain was removed over the wire. A new 12 [place] biliary drain was easily advanced over the wire. Pigtail catheter was formed in the duodenum. Drain injection was performed. The  drain was patent and there was a filling of the common bile duct. As a result, the drain was flushed with saline and capped. Drain was sutured to skin. At this point, the patient started to complaining of leg shaking. Leg shaking progressed significantly and then the patient started complaining of chills. At this point, the patient became very tachycardic and hypertensive. Patient was given IV Demerol for rigors. The drain was also uncapped and attached to a gravity bag. The severe shaking persisted and Critical Care was consulted. Patient was admitted to Critical Care for management of the rigors. Ultrasound and fluoroscopic images were taken and saved for this procedure. FINDINGS: Right jugular Port-A-Cath is well positioned with the tip at the superior cavoatrial junction. Initial cholangiogram images demonstrated that the internal/external biliary drain was well positioned but there was no contrast draining into the common bile duct or duodenum. There was filling of the intrahepatic biliary system without significant dilatation. Following the drain exchange, the biliary drain was patent and contrast was draining into the duodenum. IMPRESSION: 1. Placement of a subcutaneous power-injectable port device. Catheter tip at the superior cavoatrial junction. 2. Biliary drain was exchanged because it was not draining internally. Biliary drain was successfully exchanged but the patient developed rigors with severe shaking immediately after the procedure. As a result, the patient was admitted to the hospital by Critical Care. Electronically Signed   By: Markus Daft M.D.   On: 09/23/2021 15:42    Medications: I have reviewed the patient's current medications.  Assessment/Plan:   Liver mass concerning for malignancy -CT abdomen/pelvis with contrast 08/14/1998 23-2.9 x 2.7 x 2.7 cm hypoenhancing mass in segment 5 near the liver hilum just above hepatic ductal confluence  suspicious for primary bile duct malignancy or  primary hepatic malignancy -MRCP 08/14/2021-irregular mass of segment 8 of the liver with perilesional enhancement concerning for cholangiocarcinoma -08/14/2021 CA 19.9 was elevated at 76 -08/16/2021 AFP 6.4 -08/16/2021 cytology from bile duct brushing showed atypical cells -08/19/2021 cytology from bile duct brushing showed cells suspicious for malignancy -08/25/2021 CT-guided liver biopsy-moderately differentiated adenocarcinoma, CK7 positive, CDX2 positive in a small population of cells, TTF-1 negative, CK20 negative-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 -CTs at [facility] 09/06/2021-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  Obstructive jaundice -08/16/2021 percutaneous biliary drain placement 3.  Hepatic steatosis 4.  GERD 5.  Hypertension 6.  Normocytic anemia 7.  Enlarged prostate seen on CT 8.  Tobacco use 9.  Enterococcus and Aerococcus bacteremia 08/20/2021-discharged 08/26/2021 to complete outpatient course of Augmentin 10.  Peripheral neuropathy 11.  Port-A-Cath placement 09/23/2021 12.  Biliary drain exchange 09/23/2021 13.  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  He was scheduled to begin systemic therapy today.  The goal is to decrease the hepatic tumor burden with systemic therapy and allow for future surgical resection.  He developed rigors following exchange of the biliary drain yesterday.  He was admitted to the ICU.  He appears well this morning.  A blood culture is  negative to date.  I discussed the case with critical care medicine.  They will follow-up on culture results and recommend outpatient antibiotic therapy.  Cycle 1 gemcitabine/cisplatin/durvalumab will be rescheduled for next week.       LOS: 1 day   Betsy Coder, MD   09/24/2021, 8:15 AM

## 2021-09-24 NOTE — Discharge Summary (Signed)
Physician Discharge Summary  Patient ID: Jeffrey Blevins MRN: [id number] DOB/AGE: Aug 16, 1965 56 y.o.  Admit date: 09/23/2021 Discharge date: 09/24/2021    Discharge Diagnoses:  SIRS / Sepsis  Cholangiocarcinoma  Biliary Drain Placement  GERD  Barrett's Esophagus  Hepatic Steatosis  Tobacco Abuse  HTN  Anxiety                                                               DISCHARGE SUMMARY   56 year old male who presented to [facility] interventional radiology for planned Port-A-Cath placement and biliary drain exchange.  He presented as an outpatient on 7/27 for planned placement of Port-A-Cath and biliary drain exchange.  The Port-A-Cath was placed in the right jugular with tip at the [place].  On IR assessment the biliary drain was well-positioned but not draining internally.  The drain was exchanged over wire with good internal drainage.  Thereafter he developed shaking rigors and had a sensation of feeling cold.  He was given Demerol for rigors.  Temperature was 101.3.  The patient was hypertensive and tachycardic.  He did not develop changes in oxygen saturation or sensorium.  He was admitted to ICU with concerns for development of sepsis.  Patient was treated empirically with IV Zosyn.  Blood cultures and biliary drain cultures were obtained & pending at time of dictation.  The patient remained hemodynamically stable without further evidence of sepsis.  He tolerated an oral diet.  Home antihypertensive were restarted.  Labs remained stable. Patient was medically cleared 7/28 for discharge with plans as below.    Of note, he was recently admitted from 6/16 - 6/29 for new finding of obstructive painless jaundice.  He had previously been evaluated by his PCP with elevated LFTs.  CT of the abdomen and pelvis showed a 2.9 x 2.7 x 2.7 echogenic mass near the liver hilum with thickening CBD.  Subsequent MRCP showed irregular mass in the liver which enhance gradually and diffuse  intrahepatic ductal dilation with wall thickening of the CBD/gallbladder.  Interventional radiology cholangiogram was completed with placement of right biliary drain.  Brushings and cytology were inconclusive.  The drain was exchanged on 6/22 due to persistent intrahepatic dilation.  Biliary cultures grew Enterococcus faecalis and viridans which were pansensitive to ampicillin.  He was discharged on Augmentin.  Blood cultures were negative at that time.  Liver biopsy was performed on 6/29 which was consistent with adenocarcinoma, moderately differentiated, most likely metastatic with mets thought to be primary in the pancreatic go biliary tree.  He follows with Dr. Benay Blevins for oncology and has been referred to Dr. Ivery Blevins at [facility] for left trisectionectomy.  Plan was for 4 months of gem/cis/durva followed by surgical resection.        DISCHARGE PLAN BY DIAGNOSIS      SIRS / Sepsis  Cholangiocarcinoma s/p Biliary Drain Placement  Barrett's Esophagus  GERD  Hepatic Steatosis   Discharge Plan: -Augmentin 875 PO BID x 5 days -discussed eating yogurt while on abx & to call if profuse diarrhea  -follow up with Dr. Benay Blevins at discharge for chemotherapy initiation  -pt instructed to continue flushing biliary drain as previously instructed  -PRN ibuprofen for pain control   Tobacco Abuse   Discharge Plan: -smoking cessation counseling   HTN  Discharge Plan: -continue home antihypertensives  -follow up with PCP as needed for BP review  Anxiety -PRN ativan  -consider SSRI for anxiety / depression, defer to PCP / ONC    --------------------------------------------------------------------            KEY EVENTS 7/27 Admit with sepsis post biliary drain exchange  7/28 Discharge  MICRO DATA  Biliary Drain  Culture 7/27 >> moderate enterobacter species >>  BCx2 7/27 >> ngtd >>   CONSULTS IR    Discharge Exam: General: adult male sitting up in bed in NAD Neuro:AAOx4, speech  clear, MAE  CV: s1s2 RRR, no m/r/g, SR 70's PULM: non-labored at rest, lungs bilaterally clear  GI:abd soft/non-tender, bsx4 active  Extremities: warm/dry Drains: R port a cath dressing c/d/i, right biliary drain with yellow brown drainage in collection bag.  Dressing c/d/i  Vitals:   09/24/21 0600 09/24/21 0700 09/24/21 0800 09/24/21 0926  BP: (!) 171/90 122/79  122/82  Pulse: 80 76  72  Resp: 10 18  11   Temp:   98.2 F (36.8 C)   TempSrc:   Oral   SpO2: 99% 94%  97%  Weight: 84.2 kg     Height:         Discharge Labs  BMET Recent Labs  Lab 09/23/21 1539 09/24/21 0310  NA 136 136  K 4.2 4.6  CL 105 101  CO2 24 24  GLUCOSE 101* 105*  BUN 12 13  CREATININE 0.95 1.05  CALCIUM 9.0 9.1    CBC Recent Labs  Lab 09/23/21 1539 09/24/21 0310  HGB 11.7* 12.0*  HCT 35.0* 35.6*  WBC 11.2* 9.1  PLT 282 256      Discharge Instructions     Call MD for:  difficulty breathing, headache or visual disturbances   Complete by: As directed    Call MD for:  extreme fatigue   Complete by: As directed    Call MD for:  hives   Complete by: As directed    Call MD for:  persistant dizziness or light-headedness   Complete by: As directed    Call MD for:  persistant nausea and vomiting   Complete by: As directed    Call MD for:  redness, tenderness, or signs of infection (pain, swelling, redness, odor or green/yellow discharge around incision site)   Complete by: As directed    Call MD for:  severe uncontrolled pain   Complete by: As directed    Call MD for:  temperature >100.4   Complete by: As directed    Diet general   Complete by: As directed    Discharge instructions   Complete by: As directed    1. Review your medications carefully as they may have changed.  2. Complete your antibiotic as prescribed. Eat yogurt daily while on antibiotics.  3. Call your MD if you develop persistent diarrhea.  4. We will call you with the culture results & let you know if you need  more antibiotics.  5. Follow up with Dr. Benay Blevins for planned chemotherapy  6. Focus on eating well, nutrition is very important for your body at this time.  7. Follow up with Interventional Radiology as planned for next drain exchange in September.  8. Continue to flush drain as previously instructed.  9. Call if you have new or worsening symptoms or report to the ER if you have significant concerns.  10. Follow up with your primary care provider as needed.   Increase activity slowly  Complete by: As directed          Follow-up Information     Ladell Pier, MD Follow up.   Specialty: Oncology Why: You will be contacted regarding your chemotherapy Contact information: [place] [address] [phone]         Lazoff, Raquel Sarna P, DO Follow up.   Specialty: Family Medicine Why: As needed Contact information: [address] [place] [address] [phone]                  Allergies as of 09/24/2021       Reactions   Codeine Nausea And Vomiting, Rash        Medication List     TAKE these medications    acetaminophen 500 MG tablet Commonly known as: TYLENOL Take 1,000 mg by mouth 3 (three) times daily as needed for headache (pain).   amLODipine 10 MG tablet Commonly known as: NORVASC Take 10 mg by mouth daily.   amoxicillin-clavulanate 875-125 MG tablet Commonly known as: AUGMENTIN Take 1 tablet by mouth 2 (two) times daily for 5 days. First dose pm of 7/28   apixaban 5 MG Tabs tablet Commonly known as: ELIQUIS Take 1 tablet (5 mg total) by mouth 2 (two) times daily.   esomeprazole 20 MG capsule Commonly known as: NEXIUM Take 20 mg by mouth every morning.   ibuprofen 200 MG tablet Commonly known as: ADVIL Take 400 mg by mouth 3 (three) times daily as needed for headache (pain).   irbesartan 150 MG tablet Commonly known as: AVAPRO Take 150 mg by mouth daily.   lidocaine-prilocaine cream Commonly known  as: EMLA Apply 1 Application topically as needed.   LORazepam 0.5 MG tablet Commonly known as: ATIVAN Take 1 tablet as needed for insomnia What changed:  how much to take how to take this when to take this reasons to take this   ondansetron 8 MG tablet Commonly known as: ZOFRAN Take 1 tablet (8 mg total) by mouth every 8 (eight) hours as needed for nausea or vomiting (Starting day 4 after chemo as needed).   prochlorperazine 10 MG tablet Commonly known as: COMPAZINE Take 1 tablet (10 mg total) by mouth every 6 (six) hours as needed for nausea or vomiting.          Disposition: Home. No new home health needs identified at this time.   Discharged Condition: RIAZ ONORATO has met maximum benefit of inpatient care and is medically stable and cleared for discharge.  Patient is pending follow up as above.      Time spent on disposition:  36 Minutes.     Signed: Noe Gens, MSN, APRN, NP-C, AGACNP-BC [facility] 09/24/2021, 11:17 AM   Please see [email or web address] for pager details.

## 2021-09-24 NOTE — Plan of Care (Signed)
  Problem: Education: Goal: Knowledge of General Education information will improve Description: Including pain rating scale, medication(s)/side effects and non-pharmacologic comfort measures Outcome: Adequate for Discharge   Problem: Health Behavior/Discharge Planning: Goal: Ability to manage health-related needs will improve Outcome: Adequate for Discharge   Problem: Clinical Measurements: Goal: Ability to maintain clinical measurements within normal limits will improve Outcome: Adequate for Discharge Goal: Will remain free from infection Outcome: Adequate for Discharge Goal: Diagnostic test results will improve Outcome: Adequate for Discharge Goal: Respiratory complications will improve Outcome: Adequate for Discharge Goal: Cardiovascular complication will be avoided Outcome: Adequate for Discharge   Problem: Activity: Goal: Risk for activity intolerance will decrease Outcome: Adequate for Discharge   Problem: Nutrition: Goal: Adequate nutrition will be maintained Outcome: Adequate for Discharge   Problem: Coping: Goal: Level of anxiety will decrease Outcome: Adequate for Discharge   Problem: Elimination: Goal: Will not experience complications related to bowel motility Outcome: Adequate for Discharge Goal: Will not experience complications related to urinary retention Outcome: Adequate for Discharge   Problem: Pain Managment: Goal: General experience of comfort will improve Outcome: Adequate for Discharge   Problem: Safety: Goal: Ability to remain free from injury will improve Outcome: Adequate for Discharge   Problem: Skin Integrity: Goal: Risk for impaired skin integrity will decrease Outcome: Adequate for Discharge   Problem: Education: Goal: Knowledge of the prescribed therapeutic regimen will improve Outcome: Adequate for Discharge   Problem: Activity: Goal: Ability to implement measures to reduce episodes of fatigue will improve Outcome: Adequate for  Discharge   Problem: Bowel/Gastric: Goal: Will not experience complications related to bowel motility Outcome: Adequate for Discharge   Problem: Coping: Goal: Ability to identify and develop effective coping behavior will improve Outcome: Adequate for Discharge   Problem: Nutritional: Goal: Maintenance of adequate nutrition will improve Outcome: Adequate for Discharge

## 2021-09-26 ENCOUNTER — Other Ambulatory Visit: Payer: Self-pay

## 2021-09-26 ENCOUNTER — Other Ambulatory Visit: Payer: Self-pay | Admitting: Oncology

## 2021-09-27 ENCOUNTER — Other Ambulatory Visit: Payer: Self-pay | Admitting: Pulmonary Disease

## 2021-09-27 ENCOUNTER — Telehealth: Payer: Self-pay | Admitting: Pulmonary Disease

## 2021-09-27 DIAGNOSIS — C221 Intrahepatic bile duct carcinoma: Secondary | ICD-10-CM

## 2021-09-27 MED ORDER — SULFAMETHOXAZOLE-TRIMETHOPRIM 800-160 MG PO TABS: 1.0000 | ORAL_TABLET | Freq: Two times a day (BID) | ORAL | 0 refills | Status: DC

## 2021-09-27 NOTE — Progress Notes (Unsigned)
Culture data reviewed from biliary drain > enterobacter cloacae with resistance to zosyn.  Change abx to bactrim (sensitive) for 7 days.   Will need BMP to review K+ in 3-4 days.      Noe Gens, MSN, APRN, NP-C, AGACNP-BC [facility] 09/27/2021, 8:24 AM   Please see [email or web address] for pager details.   From 7A-7P if no response, please call [phone] After hours, please call ELink [phone]

## 2021-09-27 NOTE — Telephone Encounter (Addendum)
Patient called with culture results from biliary drain.  Discussed new antibiotic - Bactrim and need for him to stop taking the Augmentin.  He states he feels ok other than soreness from the procedure.  Denies fevers / chills.  Reviewed need for lab work in 3-4 days to review potassium. Discussed with Dr. Benay Spice.         Noe Gens, MSN, APRN, NP-C, AGACNP-BC [facility] 09/27/2021, 8:30 AM   Please see [email or web address] for pager details.   From 7A-7P if no response, please call [phone] After hours, please call ELink [phone]

## 2021-09-28 ENCOUNTER — Other Ambulatory Visit: Payer: Self-pay

## 2021-09-28 LAB — BODY FLUID CULTURE W GRAM STAIN: Gram Stain: NONE SEEN

## 2021-09-28 LAB — CULTURE, BLOOD (ROUTINE X 2)
Culture: NO GROWTH
Culture: NO GROWTH
Special Requests: ADEQUATE
Special Requests: ADEQUATE

## 2021-09-30 ENCOUNTER — Other Ambulatory Visit: Payer: Self-pay | Admitting: Pharmacist

## 2021-09-30 ENCOUNTER — Inpatient Hospital Stay: Payer: Managed Care, Other (non HMO) | Attending: Oncology

## 2021-09-30 ENCOUNTER — Inpatient Hospital Stay: Payer: Managed Care, Other (non HMO)

## 2021-09-30 ENCOUNTER — Inpatient Hospital Stay: Payer: Managed Care, Other (non HMO) | Admitting: Nurse Practitioner

## 2021-09-30 ENCOUNTER — Encounter: Payer: Self-pay | Admitting: *Deleted

## 2021-09-30 ENCOUNTER — Inpatient Hospital Stay: Payer: Managed Care, Other (non HMO) | Admitting: Oncology

## 2021-09-30 ENCOUNTER — Encounter: Payer: Self-pay | Admitting: Nurse Practitioner

## 2021-09-30 VITALS — BP 130/81 | HR 73 | Temp 98.2°F | Resp 18 | Ht 74.0 in | Wt 186.4 lb

## 2021-09-30 DIAGNOSIS — Z5112 Encounter for antineoplastic immunotherapy: Secondary | ICD-10-CM | POA: Diagnosis present

## 2021-09-30 DIAGNOSIS — Z95828 Presence of other vascular implants and grafts: Secondary | ICD-10-CM

## 2021-09-30 DIAGNOSIS — C221 Intrahepatic bile duct carcinoma: Secondary | ICD-10-CM | POA: Diagnosis not present

## 2021-09-30 DIAGNOSIS — Z5189 Encounter for other specified aftercare: Secondary | ICD-10-CM | POA: Insufficient documentation

## 2021-09-30 DIAGNOSIS — D649 Anemia, unspecified: Secondary | ICD-10-CM | POA: Diagnosis not present

## 2021-09-30 DIAGNOSIS — Z5111 Encounter for antineoplastic chemotherapy: Secondary | ICD-10-CM | POA: Diagnosis present

## 2021-09-30 LAB — CBC WITH DIFFERENTIAL (CANCER CENTER ONLY)
Abs Immature Granulocytes: 0.02 10*3/uL (ref 0.00–0.07)
Basophils Absolute: 0.1 10*3/uL (ref 0.0–0.1)
Basophils Relative: 1 %
Eosinophils Absolute: 0.3 10*3/uL (ref 0.0–0.5)
Eosinophils Relative: 4 %
HCT: 37.8 % — ABNORMAL LOW (ref 39.0–52.0)
Hemoglobin: 12.7 g/dL — ABNORMAL LOW (ref 13.0–17.0)
Immature Granulocytes: 0 %
Lymphocytes Relative: 39 %
Lymphs Abs: 3 10*3/uL (ref 0.7–4.0)
MCH: 29.4 pg (ref 26.0–34.0)
MCHC: 33.6 g/dL (ref 30.0–36.0)
MCV: 87.5 fL (ref 80.0–100.0)
Monocytes Absolute: 0.5 10*3/uL (ref 0.1–1.0)
Monocytes Relative: 7 %
Neutro Abs: 3.9 10*3/uL (ref 1.7–7.7)
Neutrophils Relative %: 49 %
Platelet Count: 313 10*3/uL (ref 150–400)
RBC: 4.32 MIL/uL (ref 4.22–5.81)
RDW: 13.5 % (ref 11.5–15.5)
WBC Count: 7.7 10*3/uL (ref 4.0–10.5)
nRBC: 0 % (ref 0.0–0.2)

## 2021-09-30 LAB — CMP (CANCER CENTER ONLY)
ALT: 91 U/L — ABNORMAL HIGH (ref 0–44)
AST: 57 U/L — ABNORMAL HIGH (ref 15–41)
Albumin: 4.3 g/dL (ref 3.5–5.0)
Alkaline Phosphatase: 208 U/L — ABNORMAL HIGH (ref 38–126)
Anion gap: 12 (ref 5–15)
BUN: 16 mg/dL (ref 6–20)
CO2: 23 mmol/L (ref 22–32)
Calcium: 10 mg/dL (ref 8.9–10.3)
Chloride: 101 mmol/L (ref 98–111)
Creatinine: 1.22 mg/dL (ref 0.61–1.24)
GFR, Estimated: >60 mL/min (ref >60)
Glucose, Bld: 84 mg/dL (ref 70–99)
Potassium: 4.6 mmol/L (ref 3.5–5.1)
Sodium: 136 mmol/L (ref 135–145)
Total Bilirubin: 1.2 mg/dL (ref 0.3–1.2)
Total Protein: 7.1 g/dL (ref 6.5–8.1)

## 2021-09-30 LAB — MAGNESIUM: Magnesium: 1.9 mg/dL (ref 1.7–2.4)

## 2021-09-30 MED ORDER — HEPARIN SOD (PORK) LOCK FLUSH 100 UNIT/ML IV SOLN
500.0000 [IU] | Freq: Once | INTRAVENOUS | Status: AC
  Administered 2021-09-30: 500 [IU] via INTRAVENOUS

## 2021-09-30 MED ORDER — SODIUM CHLORIDE 0.9% FLUSH
10.0000 mL | Freq: Once | INTRAVENOUS | Status: AC
  Administered 2021-09-30: 10 mL via INTRAVENOUS

## 2021-09-30 NOTE — Patient Instructions (Signed)

## 2021-09-30 NOTE — Progress Notes (Addendum)
Patient seen by Ned Card NP today  Vitals are within treatment parameters.  Labs reviewed by Ned Card NP and are within treatment parameters. OK to proceed with ALT 91 per Dr. Benay Spice  Per physician team, patient is ready for treatment and there are NO modifications to the treatment plan.  OK to treat on on 10/01/2021.

## 2021-09-30 NOTE — Progress Notes (Signed)
[place] OFFICE PROGRESS NOTE   Diagnosis: Cholangiocarcinoma  INTERVAL HISTORY:   [name] returns for follow-up.  He is scheduled to begin cycle 1 gemcitabine/cisplatin/durvalumab 10/01/2021.  No further fever/chills.  He is completing the course of antibiotic.  No nausea or vomiting.  He feels "full".  Bowels are moving.  Objective:  Vital signs in last 24 hours:  Blood pressure 130/81, pulse 73, temperature 98.2 F (36.8 C), temperature source Oral, resp. rate 18, height '6\' 2"'$  (1.88 m), weight 186 lb 6.4 oz (84.6 kg), SpO2 100 %.    HEENT: Sclera anicteric.  No thrush or ulcers. Resp: Lungs clear bilaterally. Cardio: Regular rate and rhythm. GI: Soft and nontender.  Right upper quadrant biliary drain, site is covered with a gauze dressing. Vascular: No leg edema. Neuro: Alert and oriented. Skin: No rash. Port-A-Cath without erythema.  Lab Results:  Lab Results  Component Value Date   WBC 7.7 09/30/2021   HGB 12.7 (L) 09/30/2021   HCT 37.8 (L) 09/30/2021   MCV 87.5 09/30/2021   PLT 313 09/30/2021   NEUTROABS 3.9 09/30/2021    Imaging:  No results found.  Medications: I have reviewed the patient's current medications.  Assessment/Plan:  Liver mass concerning for malignancy -CT abdomen/pelvis with contrast 08/14/1998 23-2.9 x 2.7 x 2.7 cm hypoenhancing mass in segment 5 near the liver hilum just above hepatic ductal confluence suspicious for primary bile duct malignancy or primary hepatic malignancy -MRCP 08/14/2021-irregular mass of segment 8 of the liver with perilesional enhancement concerning for cholangiocarcinoma -08/14/2021 CA 19.9 was elevated at 76 -08/16/2021 AFP 6.4 -08/16/2021 cytology from bile duct brushing showed atypical cells -08/19/2021 cytology from bile duct brushing showed cells suspicious for malignancy -08/25/2021 CT-guided liver biopsy-moderately differentiated adenocarcinoma, CK7 positive, CDX2 positive in a small population of  cells, TTF-1 negative, CK20 negative-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 -CTs at [facility] 09/06/2021-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  Obstructive jaundice -08/16/2021 percutaneous biliary drain placement 3.  Hepatic steatosis 4.  GERD 5.  Hypertension 6.  Normocytic anemia 7.  Enlarged prostate seen on CT 8.  Tobacco use 9.  Enterococcus and Aerococcus bacteremia 08/20/2021-discharged 08/26/2021 to complete outpatient course of Augmentin 10.  Peripheral neuropathy 11.  Port-A-Cath placement 09/23/2021 12.  Biliary drain exchange 09/23/2021 13.  Rigors following biliary drain procedure 09/23/2021, biliary drain culture-Enterobacter Cloacae, Bactrim x7 days 09/27/2021    Disposition: Mr. Hartman appears stable.  He is scheduled to begin treatment with cycle 1 gemcitabine/cisplatin/durvalumab 10/01/2021.  We again reviewed potential toxicities.  He agrees to proceed. CBC and chemistry panel reviewed.  Labs adequate to proceed as above.  He will return for lab, follow-up, day 8 gemcitabine/cisplatin on 10/07/2021.  We are available to see him sooner if needed.  Patient seen with Dr. Benay Spice.    Ned Card ANP/GNP-BC   09/30/2021  8:29 AM This was a shared visit with Ned Card.  The bilirubin is lower following the biliary drain exchange procedure last week.  The plan is to begin systemic therapy tomorrow.  He is completing a course of Bactrim  for the Enterobacter positive culture from the biliary  drain.  I was present for greater than 50% of today's visit.  I performed medical decision making.  Julieanne Manson, MD

## 2021-10-01 ENCOUNTER — Inpatient Hospital Stay: Payer: Managed Care, Other (non HMO)

## 2021-10-01 ENCOUNTER — Encounter: Payer: Self-pay | Admitting: Oncology

## 2021-10-01 VITALS — BP 113/66 | HR 80 | Temp 97.9°F | Resp 18

## 2021-10-01 DIAGNOSIS — C221 Intrahepatic bile duct carcinoma: Secondary | ICD-10-CM

## 2021-10-01 DIAGNOSIS — Z5112 Encounter for antineoplastic immunotherapy: Secondary | ICD-10-CM | POA: Diagnosis not present

## 2021-10-01 MED ORDER — SODIUM CHLORIDE 0.9% FLUSH
10.0000 mL | INTRAVENOUS | Status: DC | PRN
  Administered 2021-10-01: 10 mL

## 2021-10-01 MED ORDER — MAGNESIUM SULFATE 2 GM/50ML IV SOLN
2.0000 g | Freq: Once | INTRAVENOUS | Status: AC
  Administered 2021-10-01: 2 g via INTRAVENOUS

## 2021-10-01 MED ORDER — SODIUM CHLORIDE 0.9 % IV SOLN: Freq: Once | INTRAVENOUS | Status: DC

## 2021-10-01 MED ORDER — SODIUM CHLORIDE 0.9 % IV SOLN
23.5000 mg/m2 | Freq: Once | INTRAVENOUS | Status: AC
  Administered 2021-10-01: 50 mg via INTRAVENOUS
  Filled 2021-10-01: qty 50

## 2021-10-01 MED ORDER — PALONOSETRON HCL INJECTION 0.25 MG/5ML
0.2500 mg | Freq: Once | INTRAVENOUS | Status: AC
  Administered 2021-10-01: 0.25 mg via INTRAVENOUS
  Filled 2021-10-01: qty 5

## 2021-10-01 MED ORDER — SODIUM CHLORIDE 0.9 % IV SOLN
1500.0000 mg | Freq: Once | INTRAVENOUS | Status: AC
  Administered 2021-10-01: 1500 mg via INTRAVENOUS
  Filled 2021-10-01: qty 30

## 2021-10-01 MED ORDER — SODIUM CHLORIDE 0.9 % IV SOLN
10.0000 mg | Freq: Once | INTRAVENOUS | Status: AC
  Administered 2021-10-01: 10 mg via INTRAVENOUS
  Filled 2021-10-01: qty 10

## 2021-10-01 MED ORDER — SODIUM CHLORIDE 0.9 % IV SOLN
2000.0000 mg | Freq: Once | INTRAVENOUS | Status: AC
  Administered 2021-10-01: 2000 mg via INTRAVENOUS
  Filled 2021-10-01: qty 52.6

## 2021-10-01 MED ORDER — HEPARIN SOD (PORK) LOCK FLUSH 100 UNIT/ML IV SOLN
500.0000 [IU] | Freq: Once | INTRAVENOUS | Status: AC | PRN
  Administered 2021-10-01: 500 [IU]

## 2021-10-01 MED ORDER — POTASSIUM CHLORIDE IN NACL 20-0.9 MEQ/L-% IV SOLN
Freq: Once | INTRAVENOUS | Status: AC
  Filled 2021-10-01: qty 1000

## 2021-10-01 MED ORDER — SODIUM CHLORIDE 0.9 % IV SOLN
150.0000 mg | Freq: Once | INTRAVENOUS | Status: AC
  Administered 2021-10-01: 150 mg via INTRAVENOUS
  Filled 2021-10-01: qty 5

## 2021-10-01 MED ORDER — SODIUM CHLORIDE 0.9 % IV SOLN: Freq: Once | INTRAVENOUS | Status: AC

## 2021-10-01 NOTE — Patient Instructions (Signed)
[facility]  Discharge Instructions: Thank you for choosing [facility] to provide your oncology and hematology care.   If you have a lab appointment with the Cancer Center, please go directly to the Cancer Center and check in at the registration area.   Wear comfortable clothing and clothing appropriate for easy access to any Portacath or PICC line.   We strive to give you quality time with your provider. You may need to reschedule your appointment if you arrive late (15 or more minutes).  Arriving late affects you and other patients whose appointments are after yours.  Also, if you miss three or more appointments without notifying the office, you may be dismissed from the clinic at the provider's discretion.      For prescription refill requests, have your pharmacy contact our office and allow 72 hours for refills to be completed.    Today you received the following chemotherapy and/or immunotherapy agents: durvalumab, gemcitabine, cisplatin.       To help prevent nausea and vomiting after your treatment, we encourage you to take your nausea medication as directed.  BELOW ARE SYMPTOMS THAT SHOULD BE REPORTED IMMEDIATELY: *FEVER GREATER THAN 100.4 F (38 C) OR HIGHER *CHILLS OR SWEATING *NAUSEA AND VOMITING THAT IS NOT CONTROLLED WITH YOUR NAUSEA MEDICATION *UNUSUAL SHORTNESS OF BREATH *UNUSUAL BRUISING OR BLEEDING *URINARY PROBLEMS (pain or burning when urinating, or frequent urination) *BOWEL PROBLEMS (unusual diarrhea, constipation, pain near the anus) TENDERNESS IN MOUTH AND THROAT WITH OR WITHOUT PRESENCE OF ULCERS (sore throat, sores in mouth, or a toothache) UNUSUAL RASH, SWELLING OR PAIN  UNUSUAL VAGINAL DISCHARGE OR ITCHING   Items with * indicate a potential emergency and should be followed up as soon as possible or go to the Emergency Department if any problems should occur.  Please show the CHEMOTHERAPY ALERT CARD or IMMUNOTHERAPY  ALERT CARD at check-in to the Emergency Department and triage nurse.  Should you have questions after your visit or need to cancel or reschedule your appointment, please contact [facility]  Dept: [phone]  and follow the prompts.  Office hours are 8:00 a.m. to 4:30 p.m. Monday - Friday. Please note that voicemails left after 4:00 p.m. may not be returned until the following business day.  We are closed weekends and major holidays. You have access to a nurse at all times for urgent questions. Please call the main number to the clinic Dept: [phone] and follow the prompts.   For any non-urgent questions, you may also contact your provider using MyChart. We now offer e-Visits for anyone 18 and older to request care online for non-urgent symptoms. For details visit [email or web address].   Also download the MyChart app! Go to the app store, search "MyChart", open the app, select [facility], and log in with your MyChart username and password.  Masks are optional in the cancer centers. If you would like for your care team to wear a mask while they are taking care of you, please let them know. You may have one support person who is at least 57 years old accompany you for your appointments.  Durvalumab Injection What is this medication? DURVALUMAB (dur VAL ue mab) treats some types of cancer. It works by helping your immune system slow or stop the spread of cancer cells. It is a monoclonal antibody. This medicine may be used for other purposes; ask your health care provider or pharmacist if you have questions. COMMON BRAND NAME(S):   IMFINZI What should I tell my care team before I take this medication? They need to know if you have any of these conditions: Allogeneic stem cell transplant (uses someone else's stem cells) Autoimmune diseases, such as Crohn disease, ulcerative colitis, lupus History of chest radiation Nervous system problems, such as Guillain-Barre  syndrome, myasthenia gravis Organ transplant An unusual or allergic reaction to durvalumab, other medications, foods, dyes, or preservatives Pregnant or trying to get pregnant Breast-feeding How should I use this medication? This medication is infused into a vein. It is given by your care team in a hospital or clinic setting. A special MedGuide will be given to you before each treatment. Be sure to read this information carefully each time. Talk to your care team about the use of this medication in children. Special care may be needed. Overdosage: If you think you have taken too much of this medicine contact a poison control center or emergency room at once. NOTE: This medicine is only for you. Do not share this medicine with others. What if I miss a dose? Keep appointments for follow-up doses. It is important not to miss your dose. Call your care team if you are unable to keep an appointment. What may interact with this medication? Interactions have not been studied. This list may not describe all possible interactions. Give your health care provider a list of all the medicines, herbs, non-prescription drugs, or dietary supplements you use. Also tell them if you smoke, drink alcohol, or use illegal drugs. Some items may interact with your medicine. What should I watch for while using this medication? Your condition will be monitored carefully while you are receiving this medication. You may need blood work while taking this medication. This medication may cause serious skin reactions. They can happen weeks to months after starting the medication. Contact your care team right away if you notice fevers or flu-like symptoms with a rash. The rash may be red or purple and then turn into blisters or peeling of the skin. You may also notice a red rash with swelling of the face, lips, or lymph nodes in your neck or under your arms. Tell your care team right away if you have any change in your  eyesight. Talk to your care team if you may be pregnant. Serious birth defects can occur if you take this medication during pregnancy and for 3 months after the last dose. You will need a negative pregnancy test before starting this medication. Contraception is recommended while taking this medication and for 3 months after the last dose. Your care team can help you find the option that works for you. Do not breastfeed while taking this medication and for 3 months after the last dose. What side effects may I notice from receiving this medication? Side effects that you should report to your care team as soon as possible: Allergic reactions--skin rash, itching, hives, swelling of the face, lips, tongue, or throat Dry cough, shortness of breath or trouble breathing Eye pain, redness, irritation, or discharge with blurry or decreased vision Heart muscle inflammation--unusual weakness or fatigue, shortness of breath, chest pain, fast or irregular heartbeat, dizziness, swelling of the ankles, feet, or hands Hormone gland problems--headache, sensitivity to light, unusual weakness or fatigue, dizziness, fast or irregular heartbeat, increased sensitivity to cold or heat, excessive sweating, constipation, hair loss, increased thirst or amount of urine, tremors or shaking, irritability Infusion reactions--chest pain, shortness of breath or trouble breathing, feeling faint or lightheaded Kidney injury (glomerulonephritis)--decrease in  the amount of urine, red or dark brown urine, foamy or bubbly urine, swelling of the ankles, hands, or feet Liver injury--right upper belly pain, loss of appetite, nausea, light-colored stool, dark yellow or brown urine, yellowing skin or eyes, unusual weakness or fatigue Pain, tingling, or numbness in the hands or feet, muscle weakness, change in vision, confusion or trouble speaking, loss of balance or coordination, trouble walking, seizures Rash, fever, and swollen lymph  nodes Redness, blistering, peeling, or loosening of the skin, including inside the mouth Sudden or severe stomach pain, bloody diarrhea, fever, nausea, vomiting Side effects that usually do not require medical attention (report these to your care team if they continue or are bothersome): Bone, joint, or muscle pain Diarrhea Fatigue Loss of appetite Nausea Skin rash This list may not describe all possible side effects. Call your doctor for medical advice about side effects. You may report side effects to FDA at 1-800-FDA-1088. Where should I keep my medication? This medication is given in a hospital or clinic. It will not be stored at home. NOTE: This sheet is a summary. It may not cover all possible information. If you have questions about this medicine, talk to your doctor, pharmacist, or health care provider.  2023 Elsevier/Gold Standard (2020-08-05 00:00:00)   Gemcitabine Injection What is this medication? GEMCITABINE (jem SYE ta been) treats some types of cancer. It works by slowing down the growth of cancer cells. This medicine may be used for other purposes; ask your health care provider or pharmacist if you have questions. COMMON BRAND NAME(S): Gemzar, Infugem What should I tell my care team before I take this medication? They need to know if you have any of these conditions: Blood disorders Infection Kidney disease Liver disease Lung or breathing disease, such as asthma or COPD Recent or ongoing radiation therapy An unusual or allergic reaction to gemcitabine, other medications, foods, dyes, or preservatives If you or your partner are pregnant or trying to get pregnant Breast-feeding How should I use this medication? This medication is injected into a vein. It is given by your care team in a hospital or clinic setting. Talk to your care team about the use of this medication in children. Special care may be needed. Overdosage: If you think you have taken too much of this  medicine contact a poison control center or emergency room at once. NOTE: This medicine is only for you. Do not share this medicine with others. What if I miss a dose? Keep appointments for follow-up doses. It is important not to miss your dose. Call your care team if you are unable to keep an appointment. What may interact with this medication? Interactions have not been studied. This list may not describe all possible interactions. Give your health care provider a list of all the medicines, herbs, non-prescription drugs, or dietary supplements you use. Also tell them if you smoke, drink alcohol, or use illegal drugs. Some items may interact with your medicine. What should I watch for while using this medication? Your condition will be monitored carefully while you are receiving this medication. This medication may make you feel generally unwell. This is not uncommon, as chemotherapy can affect healthy cells as well as cancer cells. Report any side effects. Continue your course of treatment even though you feel ill unless your care team tells you to stop. In some cases, you may be given additional medications to help with side effects. Follow all directions for their use. This medication may increase your risk   of getting an infection. Call your care team for advice if you get a fever, chills, sore throat, or other symptoms of a cold or flu. Do not treat yourself. Try to avoid being around people who are sick. This medication may increase your risk to bruise or bleed. Call your care team if you notice any unusual bleeding. Be careful brushing or flossing your teeth or using a toothpick because you may get an infection or bleed more easily. If you have any dental work done, tell your dentist you are receiving this medication. Avoid taking medications that contain aspirin, acetaminophen, ibuprofen, naproxen, or ketoprofen unless instructed by your care team. These medications may hide a fever. Talk to  your care team if you or your partner wish to become pregnant or think you might be pregnant. This medication can cause serious birth defects if taken during pregnancy and for 6 months after the last dose. A negative pregnancy test is required before starting this medication. A reliable form of contraception is recommended while taking this medication and for 6 months after the last dose. Talk to your care team about effective forms of contraception. Do not father a child while taking this medication and for 3 months after the last dose. Use a condom while having sex during this time period. Do not breastfeed while taking this medication and for at least 1 week after the last dose. This medication may cause infertility. Talk to your care team if you are concerned about your fertility. What side effects may I notice from receiving this medication? Side effects that you should report to your care team as soon as possible: Allergic reactions--skin rash, itching, hives, swelling of the face, lips, tongue, or throat Capillary leak syndrome--stomach or muscle pain, unusual weakness or fatigue, feeling faint or lightheaded, decrease in the amount of urine, swelling of the ankles, hands, or feet, trouble breathing Infection--fever, chills, cough, sore throat, wounds that don't heal, pain or trouble when passing urine, general feeling of discomfort or being unwell Liver injury--right upper belly pain, loss of appetite, nausea, light-colored stool, dark yellow or brown urine, yellowing skin or eyes, unusual weakness or fatigue Low red blood cell level--unusual weakness or fatigue, dizziness, headache, trouble breathing Lung injury--shortness of breath or trouble breathing, cough, spitting up blood, chest pain, fever Stomach pain, bloody diarrhea, pale skin, unusual weakness or fatigue, decrease in the amount of urine, which may be signs of hemolytic uremic syndrome Sudden and severe headache, confusion, change in  vision, seizures, which may be signs of posterior reversible encephalopathy syndrome (PRES) Unusual bruising or bleeding Side effects that usually do not require medical attention (report to your care team if they continue or are bothersome): Diarrhea Drowsiness Hair loss Nausea Pain, redness, or swelling with sores inside the mouth or throat Vomiting This list may not describe all possible side effects. Call your doctor for medical advice about side effects. You may report side effects to FDA at 1-800-FDA-1088. Where should I keep my medication? This medication is given in a hospital or clinic. It will not be stored at home. NOTE: This sheet is a summary. It may not cover all possible information. If you have questions about this medicine, talk to your doctor, pharmacist, or health care provider.  2023 Elsevier/Gold Standard (2021-06-16 00:00:00)   Cisplatin Injection What is this medication? CISPLATIN (SIS pla tin) treats some types of cancer. It works by slowing down the growth of cancer cells. This medicine may be used for other purposes;   ask your health care provider or pharmacist if you have questions. COMMON BRAND NAME(S): Platinol, Platinol -AQ What should I tell my care team before I take this medication? They need to know if you have any of these conditions: Eye disease, vision problems Hearing problems Kidney disease Low blood counts, such as low white cells, platelets, or red blood cells Tingling of the fingers or toes, or other nerve disorder An unusual or allergic reaction to cisplatin, carboplatin, oxaliplatin, other medications, foods, dyes, or preservatives If you or your partner are pregnant or trying to get pregnant Breast-feeding How should I use this medication? This medication is injected into a vein. It is given by your care team in a hospital or clinic setting. Talk to your care team about the use of this medication in children. Special care may be  needed. Overdosage: If you think you have taken too much of this medicine contact a poison control center or emergency room at once. NOTE: This medicine is only for you. Do not share this medicine with others. What if I miss a dose? Keep appointments for follow-up doses. It is important not to miss your dose. Call your care team if you are unable to keep an appointment. What may interact with this medication? Do not take this medication with any of the following: Live virus vaccines This medication may also interact with the following: Certain antibiotics, such as amikacin, gentamicin, neomycin, polymyxin B, streptomycin, tobramycin, vancomycin Foscarnet This list may not describe all possible interactions. Give your health care provider a list of all the medicines, herbs, non-prescription drugs, or dietary supplements you use. Also tell them if you smoke, drink alcohol, or use illegal drugs. Some items may interact with your medicine. What should I watch for while using this medication? Your condition will be monitored carefully while you are receiving this medication. You may need blood work done while taking this medication. This medication may make you feel generally unwell. This is not uncommon, as chemotherapy can affect healthy cells as well as cancer cells. Report any side effects. Continue your course of treatment even though you feel ill unless your care team tells you to stop. This medication may increase your risk of getting an infection. Call your care team for advice if you get a fever, chills, sore throat, or other symptoms of a cold or flu. Do not treat yourself. Try to avoid being around people who are sick. Avoid taking medications that contain aspirin, acetaminophen, ibuprofen, naproxen, or ketoprofen unless instructed by your care team. These medications may hide a fever. This medication may increase your risk to bruise or bleed. Call your care team if you notice any unusual  bleeding. Be careful brushing or flossing your teeth or using a toothpick because you may get an infection or bleed more easily. If you have any dental work done, tell your dentist you are receiving this medication. Drink fluids as directed while you are taking this medication. This will help protect your kidneys. Call your care team if you get diarrhea. Do not treat yourself. Talk to your care team if you or your partner wish to become pregnant or think you might be pregnant. This medication can cause serious birth defects if taken during pregnancy and for 14 months after the last dose. A negative pregnancy test is required before starting this medication. A reliable form of contraception is recommended while taking this medication and for 14 months after the last dose. Talk to your care team about   effective forms of contraception. Do not father a child while taking this medication and for 11 months after the last dose. Use a condom during sex during this time period. Do not breast-feed while taking this medication. This medication may cause infertility. Talk to your care team if you are concerned about your fertility. What side effects may I notice from receiving this medication? Side effects that you should report to your care team as soon as possible: Allergic reactions--skin rash, itching, hives, swelling of the face, lips, tongue, or throat Eye pain, change in vision, vision loss Hearing loss, ringing in ears Infection--fever, chills, cough, sore throat, wounds that don't heal, pain or trouble when passing urine, general feeling of discomfort or being unwell Kidney injury--decrease in the amount of urine, swelling of the ankles, hands, or feet Low red blood cell level--unusual weakness or fatigue, dizziness, headache, trouble breathing Painful swelling, warmth, or redness of the skin, blisters or sores at the infusion site Pain, tingling, or numbness in the hands or feet Unusual bruising or  bleeding Side effects that usually do not require medical attention (report to your care team if they continue or are bothersome): Hair loss Nausea Vomiting This list may not describe all possible side effects. Call your doctor for medical advice about side effects. You may report side effects to FDA at 1-800-FDA-1088. Where should I keep my medication? This medication is given in a hospital or clinic. It will not be stored at home. NOTE: This sheet is a summary. It may not cover all possible information. If you have questions about this medicine, talk to your doctor, pharmacist, or health care provider.  2023 Elsevier/Gold Standard (2021-06-14 00:00:00) 

## 2021-10-02 LAB — CANCER ANTIGEN 19-9: CA 19-9: 159 U/mL — ABNORMAL HIGH (ref 0–35)

## 2021-10-03 ENCOUNTER — Other Ambulatory Visit: Payer: Self-pay | Admitting: Oncology

## 2021-10-04 ENCOUNTER — Inpatient Hospital Stay (HOSPITAL_COMMUNITY): Admission: RE | Admit: 2021-10-04 | Payer: Managed Care, Other (non HMO) | Source: Ambulatory Visit

## 2021-10-04 ENCOUNTER — Telehealth: Payer: Self-pay

## 2021-10-04 NOTE — Telephone Encounter (Signed)
Called patient for first time chemo follow-up. Patient stated he has been feeling fine with no issues.  Patient stated he did have a little more fatigue than usual but appetite was the same. Informed patient fatigue is normal with the chemo regimen he is on.  Reminded patient of upcoming appointments on Thursday. Patient verbalized understanding.

## 2021-10-06 ENCOUNTER — Other Ambulatory Visit: Payer: Self-pay

## 2021-10-07 ENCOUNTER — Inpatient Hospital Stay: Payer: Managed Care, Other (non HMO)

## 2021-10-07 ENCOUNTER — Inpatient Hospital Stay: Payer: Managed Care, Other (non HMO) | Admitting: Oncology

## 2021-10-07 ENCOUNTER — Encounter: Payer: Self-pay | Admitting: *Deleted

## 2021-10-07 VITALS — BP 134/85 | HR 69 | Temp 98.2°F | Resp 18 | Ht 74.0 in | Wt 186.0 lb

## 2021-10-07 VITALS — BP 120/83 | HR 70

## 2021-10-07 DIAGNOSIS — C221 Intrahepatic bile duct carcinoma: Secondary | ICD-10-CM | POA: Diagnosis not present

## 2021-10-07 DIAGNOSIS — Z5112 Encounter for antineoplastic immunotherapy: Secondary | ICD-10-CM | POA: Diagnosis not present

## 2021-10-07 LAB — CMP (CANCER CENTER ONLY)
ALT: 143 U/L — ABNORMAL HIGH (ref 0–44)
AST: 73 U/L — ABNORMAL HIGH (ref 15–41)
Albumin: 4.2 g/dL (ref 3.5–5.0)
Alkaline Phosphatase: 177 U/L — ABNORMAL HIGH (ref 38–126)
Anion gap: 10 (ref 5–15)
BUN: 16 mg/dL (ref 6–20)
CO2: 23 mmol/L (ref 22–32)
Calcium: 9 mg/dL (ref 8.9–10.3)
Chloride: 102 mmol/L (ref 98–111)
Creatinine: 0.9 mg/dL (ref 0.61–1.24)
GFR, Estimated: >60 mL/min (ref >60)
Glucose, Bld: 116 mg/dL — ABNORMAL HIGH (ref 70–99)
Potassium: 3.9 mmol/L (ref 3.5–5.1)
Sodium: 135 mmol/L (ref 135–145)
Total Bilirubin: 1.1 mg/dL (ref 0.3–1.2)
Total Protein: 7 g/dL (ref 6.5–8.1)

## 2021-10-07 LAB — CBC WITH DIFFERENTIAL (CANCER CENTER ONLY)
Abs Immature Granulocytes: 0.01 10*3/uL (ref 0.00–0.07)
Basophils Absolute: 0 10*3/uL (ref 0.0–0.1)
Basophils Relative: 1 %
Eosinophils Absolute: 0.1 10*3/uL (ref 0.0–0.5)
Eosinophils Relative: 2 %
HCT: 33.7 % — ABNORMAL LOW (ref 39.0–52.0)
Hemoglobin: 11.4 g/dL — ABNORMAL LOW (ref 13.0–17.0)
Immature Granulocytes: 0 %
Lymphocytes Relative: 38 %
Lymphs Abs: 1.9 10*3/uL (ref 0.7–4.0)
MCH: 29.3 pg (ref 26.0–34.0)
MCHC: 33.8 g/dL (ref 30.0–36.0)
MCV: 86.6 fL (ref 80.0–100.0)
Monocytes Absolute: 0.1 10*3/uL (ref 0.1–1.0)
Monocytes Relative: 2 %
Neutro Abs: 2.8 10*3/uL (ref 1.7–7.7)
Neutrophils Relative %: 57 %
Platelet Count: 215 10*3/uL (ref 150–400)
RBC: 3.89 MIL/uL — ABNORMAL LOW (ref 4.22–5.81)
RDW: 13.2 % (ref 11.5–15.5)
WBC Count: 4.9 10*3/uL (ref 4.0–10.5)
nRBC: 0 % (ref 0.0–0.2)

## 2021-10-07 LAB — MAGNESIUM: Magnesium: 1.7 mg/dL (ref 1.7–2.4)

## 2021-10-07 MED ORDER — SODIUM CHLORIDE 0.9% FLUSH
10.0000 mL | INTRAVENOUS | Status: DC | PRN
  Administered 2021-10-07: 10 mL

## 2021-10-07 MED ORDER — SODIUM CHLORIDE 0.9 % IV SOLN
1000.0000 mg/m2 | Freq: Once | INTRAVENOUS | Status: AC
  Administered 2021-10-07: 2128 mg via INTRAVENOUS
  Filled 2021-10-07: qty 52.6

## 2021-10-07 MED ORDER — TRAMADOL HCL 50 MG PO TABS: 25.0000 mg | ORAL_TABLET | Freq: Four times a day (QID) | ORAL | 0 refills | Status: DC | PRN

## 2021-10-07 MED ORDER — SODIUM CHLORIDE 0.9 % IV SOLN: Freq: Once | INTRAVENOUS | Status: AC

## 2021-10-07 MED ORDER — SODIUM CHLORIDE 0.9 % IV SOLN
25.0000 mg/m2 | Freq: Once | INTRAVENOUS | Status: AC
  Administered 2021-10-07: 53 mg via INTRAVENOUS
  Filled 2021-10-07: qty 53

## 2021-10-07 MED ORDER — MAGNESIUM SULFATE 2 GM/50ML IV SOLN
2.0000 g | Freq: Once | INTRAVENOUS | Status: AC
  Administered 2021-10-07: 2 g via INTRAVENOUS

## 2021-10-07 MED ORDER — PALONOSETRON HCL INJECTION 0.25 MG/5ML
INTRAVENOUS | Status: AC
  Filled 2021-10-07: qty 5

## 2021-10-07 MED ORDER — MAGNESIUM SULFATE 2 GM/50ML IV SOLN
INTRAVENOUS | Status: AC
  Filled 2021-10-07: qty 50

## 2021-10-07 MED ORDER — SODIUM CHLORIDE 0.9 % IV SOLN
150.0000 mg | Freq: Once | INTRAVENOUS | Status: AC
  Administered 2021-10-07: 150 mg via INTRAVENOUS
  Filled 2021-10-07: qty 5

## 2021-10-07 MED ORDER — SODIUM CHLORIDE 0.9 % IV SOLN
10.0000 mg | Freq: Once | INTRAVENOUS | Status: AC
  Administered 2021-10-07: 10 mg via INTRAVENOUS
  Filled 2021-10-07: qty 1

## 2021-10-07 MED ORDER — PALONOSETRON HCL INJECTION 0.25 MG/5ML
0.2500 mg | Freq: Once | INTRAVENOUS | Status: AC
  Administered 2021-10-07: 0.25 mg via INTRAVENOUS

## 2021-10-07 MED ORDER — POTASSIUM CHLORIDE IN NACL 20-0.9 MEQ/L-% IV SOLN
Freq: Once | INTRAVENOUS | Status: AC
  Filled 2021-10-07: qty 1000

## 2021-10-07 MED ORDER — HEPARIN SOD (PORK) LOCK FLUSH 100 UNIT/ML IV SOLN
500.0000 [IU] | Freq: Once | INTRAVENOUS | Status: AC | PRN
  Administered 2021-10-07: 500 [IU]

## 2021-10-07 NOTE — Patient Instructions (Signed)
[facility]   Discharge Instructions: Thank you for choosing [facility] to provide your oncology and hematology care.   If you have a lab appointment with the Cancer Center, please go directly to the Cancer Center and check in at the registration area.   Wear comfortable clothing and clothing appropriate for easy access to any Portacath or PICC line.   We strive to give you quality time with your provider. You may need to reschedule your appointment if you arrive late (15 or more minutes).  Arriving late affects you and other patients whose appointments are after yours.  Also, if you miss three or more appointments without notifying the office, you may be dismissed from the clinic at the provider's discretion.      For prescription refill requests, have your pharmacy contact our office and allow 72 hours for refills to be completed.    Today you received the following chemotherapy and/or immunotherapy agents Gemzar, Cisplatin.      To help prevent nausea and vomiting after your treatment, we encourage you to take your nausea medication as directed.  BELOW ARE SYMPTOMS THAT SHOULD BE REPORTED IMMEDIATELY: *FEVER GREATER THAN 100.4 F (38 C) OR HIGHER *CHILLS OR SWEATING *NAUSEA AND VOMITING THAT IS NOT CONTROLLED WITH YOUR NAUSEA MEDICATION *UNUSUAL SHORTNESS OF BREATH *UNUSUAL BRUISING OR BLEEDING *URINARY PROBLEMS (pain or burning when urinating, or frequent urination) *BOWEL PROBLEMS (unusual diarrhea, constipation, pain near the anus) TENDERNESS IN MOUTH AND THROAT WITH OR WITHOUT PRESENCE OF ULCERS (sore throat, sores in mouth, or a toothache) UNUSUAL RASH, SWELLING OR PAIN  UNUSUAL VAGINAL DISCHARGE OR ITCHING   Items with * indicate a potential emergency and should be followed up as soon as possible or go to the Emergency Department if any problems should occur.  Please show the CHEMOTHERAPY ALERT CARD or IMMUNOTHERAPY ALERT CARD at  check-in to the Emergency Department and triage nurse.  Should you have questions after your visit or need to cancel or reschedule your appointment, please contact [facility]  Dept: [phone]  and follow the prompts.  Office hours are 8:00 a.m. to 4:30 p.m. Monday - Friday. Please note that voicemails left after 4:00 p.m. may not be returned until the following business day.  We are closed weekends and major holidays. You have access to a nurse at all times for urgent questions. Please call the main number to the clinic Dept: [phone] and follow the prompts.   For any non-urgent questions, you may also contact your provider using MyChart. We now offer e-Visits for anyone 18 and older to request care online for non-urgent symptoms. For details visit [email or web address].   Also download the MyChart app! Go to the app store, search "MyChart", open the app, select , and log in with your MyChart username and password.  Masks are optional in the cancer centers. If you would like for your care team to wear a mask while they are taking care of you, please let them know. You may have one support person who is at least 57 years old accompany you for your appointments.  Gemcitabine Injection What is this medication? GEMCITABINE (jem SYE ta been) treats some types of cancer. It works by slowing down the growth of cancer cells. This medicine may be used for other purposes; ask your health care provider or pharmacist if you have questions. COMMON BRAND NAME(S): Gemzar, Infugem What should I tell my care team before I   take this medication? They need to know if you have any of these conditions: Blood disorders Infection Kidney disease Liver disease Lung or breathing disease, such as asthma or COPD Recent or ongoing radiation therapy An unusual or allergic reaction to gemcitabine, other medications, foods, dyes, or preservatives If you or your partner are  pregnant or trying to get pregnant Breast-feeding How should I use this medication? This medication is injected into a vein. It is given by your care team in a hospital or clinic setting. Talk to your care team about the use of this medication in children. Special care may be needed. Overdosage: If you think you have taken too much of this medicine contact a poison control center or emergency room at once. NOTE: This medicine is only for you. Do not share this medicine with others. What if I miss a dose? Keep appointments for follow-up doses. It is important not to miss your dose. Call your care team if you are unable to keep an appointment. What may interact with this medication? Interactions have not been studied. This list may not describe all possible interactions. Give your health care provider a list of all the medicines, herbs, non-prescription drugs, or dietary supplements you use. Also tell them if you smoke, drink alcohol, or use illegal drugs. Some items may interact with your medicine. What should I watch for while using this medication? Your condition will be monitored carefully while you are receiving this medication. This medication may make you feel generally unwell. This is not uncommon, as chemotherapy can affect healthy cells as well as cancer cells. Report any side effects. Continue your course of treatment even though you feel ill unless your care team tells you to stop. In some cases, you may be given additional medications to help with side effects. Follow all directions for their use. This medication may increase your risk of getting an infection. Call your care team for advice if you get a fever, chills, sore throat, or other symptoms of a cold or flu. Do not treat yourself. Try to avoid being around people who are sick. This medication may increase your risk to bruise or bleed. Call your care team if you notice any unusual bleeding. Be careful brushing or flossing your  teeth or using a toothpick because you may get an infection or bleed more easily. If you have any dental work done, tell your dentist you are receiving this medication. Avoid taking medications that contain aspirin, acetaminophen, ibuprofen, naproxen, or ketoprofen unless instructed by your care team. These medications may hide a fever. Talk to your care team if you or your partner wish to become pregnant or think you might be pregnant. This medication can cause serious birth defects if taken during pregnancy and for 6 months after the last dose. A negative pregnancy test is required before starting this medication. A reliable form of contraception is recommended while taking this medication and for 6 months after the last dose. Talk to your care team about effective forms of contraception. Do not father a child while taking this medication and for 3 months after the last dose. Use a condom while having sex during this time period. Do not breastfeed while taking this medication and for at least 1 week after the last dose. This medication may cause infertility. Talk to your care team if you are concerned about your fertility. What side effects may I notice from receiving this medication? Side effects that you should report to your care team   as soon as possible: Allergic reactions--skin rash, itching, hives, swelling of the face, lips, tongue, or throat Capillary leak syndrome--stomach or muscle pain, unusual weakness or fatigue, feeling faint or lightheaded, decrease in the amount of urine, swelling of the ankles, hands, or feet, trouble breathing Infection--fever, chills, cough, sore throat, wounds that don't heal, pain or trouble when passing urine, general feeling of discomfort or being unwell Liver injury--right upper belly pain, loss of appetite, nausea, light-colored stool, dark yellow or brown urine, yellowing skin or eyes, unusual weakness or fatigue Low red blood cell level--unusual weakness or  fatigue, dizziness, headache, trouble breathing Lung injury--shortness of breath or trouble breathing, cough, spitting up blood, chest pain, fever Stomach pain, bloody diarrhea, pale skin, unusual weakness or fatigue, decrease in the amount of urine, which may be signs of hemolytic uremic syndrome Sudden and severe headache, confusion, change in vision, seizures, which may be signs of posterior reversible encephalopathy syndrome (PRES) Unusual bruising or bleeding Side effects that usually do not require medical attention (report to your care team if they continue or are bothersome): Diarrhea Drowsiness Hair loss Nausea Pain, redness, or swelling with sores inside the mouth or throat Vomiting This list may not describe all possible side effects. Call your doctor for medical advice about side effects. You may report side effects to FDA at 1-800-FDA-1088. Where should I keep my medication? This medication is given in a hospital or clinic. It will not be stored at home. NOTE: This sheet is a summary. It may not cover all possible information. If you have questions about this medicine, talk to your doctor, pharmacist, or health care provider.  2023 Elsevier/Gold Standard (2021-06-16 00:00:00)  Cisplatin Injection What is this medication? CISPLATIN (SIS pla tin) treats some types of cancer. It works by slowing down the growth of cancer cells. This medicine may be used for other purposes; ask your health care provider or pharmacist if you have questions. COMMON BRAND NAME(S): Platinol, Platinol -AQ What should I tell my care team before I take this medication? They need to know if you have any of these conditions: Eye disease, vision problems Hearing problems Kidney disease Low blood counts, such as low white cells, platelets, or red blood cells Tingling of the fingers or toes, or other nerve disorder An unusual or allergic reaction to cisplatin, carboplatin, oxaliplatin, other medications,  foods, dyes, or preservatives If you or your partner are pregnant or trying to get pregnant Breast-feeding How should I use this medication? This medication is injected into a vein. It is given by your care team in a hospital or clinic setting. Talk to your care team about the use of this medication in children. Special care may be needed. Overdosage: If you think you have taken too much of this medicine contact a poison control center or emergency room at once. NOTE: This medicine is only for you. Do not share this medicine with others. What if I miss a dose? Keep appointments for follow-up doses. It is important not to miss your dose. Call your care team if you are unable to keep an appointment. What may interact with this medication? Do not take this medication with any of the following: Live virus vaccines This medication may also interact with the following: Certain antibiotics, such as amikacin, gentamicin, neomycin, polymyxin B, streptomycin, tobramycin, vancomycin Foscarnet This list may not describe all possible interactions. Give your health care provider a list of all the medicines, herbs, non-prescription drugs, or dietary supplements you use. Also   tell them if you smoke, drink alcohol, or use illegal drugs. Some items may interact with your medicine. What should I watch for while using this medication? Your condition will be monitored carefully while you are receiving this medication. You may need blood work done while taking this medication. This medication may make you feel generally unwell. This is not uncommon, as chemotherapy can affect healthy cells as well as cancer cells. Report any side effects. Continue your course of treatment even though you feel ill unless your care team tells you to stop. This medication may increase your risk of getting an infection. Call your care team for advice if you get a fever, chills, sore throat, or other symptoms of a cold or flu. Do not treat  yourself. Try to avoid being around people who are sick. Avoid taking medications that contain aspirin, acetaminophen, ibuprofen, naproxen, or ketoprofen unless instructed by your care team. These medications may hide a fever. This medication may increase your risk to bruise or bleed. Call your care team if you notice any unusual bleeding. Be careful brushing or flossing your teeth or using a toothpick because you may get an infection or bleed more easily. If you have any dental work done, tell your dentist you are receiving this medication. Drink fluids as directed while you are taking this medication. This will help protect your kidneys. Call your care team if you get diarrhea. Do not treat yourself. Talk to your care team if you or your partner wish to become pregnant or think you might be pregnant. This medication can cause serious birth defects if taken during pregnancy and for 14 months after the last dose. A negative pregnancy test is required before starting this medication. A reliable form of contraception is recommended while taking this medication and for 14 months after the last dose. Talk to your care team about effective forms of contraception. Do not father a child while taking this medication and for 11 months after the last dose. Use a condom during sex during this time period. Do not breast-feed while taking this medication. This medication may cause infertility. Talk to your care team if you are concerned about your fertility. What side effects may I notice from receiving this medication? Side effects that you should report to your care team as soon as possible: Allergic reactions--skin rash, itching, hives, swelling of the face, lips, tongue, or throat Eye pain, change in vision, vision loss Hearing loss, ringing in ears Infection--fever, chills, cough, sore throat, wounds that don't heal, pain or trouble when passing urine, general feeling of discomfort or being unwell Kidney  injury--decrease in the amount of urine, swelling of the ankles, hands, or feet Low red blood cell level--unusual weakness or fatigue, dizziness, headache, trouble breathing Painful swelling, warmth, or redness of the skin, blisters or sores at the infusion site Pain, tingling, or numbness in the hands or feet Unusual bruising or bleeding Side effects that usually do not require medical attention (report to your care team if they continue or are bothersome): Hair loss Nausea Vomiting This list may not describe all possible side effects. Call your doctor for medical advice about side effects. You may report side effects to FDA at 1-800-FDA-1088. Where should I keep my medication? This medication is given in a hospital or clinic. It will not be stored at home. NOTE: This sheet is a summary. It may not cover all possible information. If you have questions about this medicine, talk to your doctor, pharmacist, or   health care provider.  2023 Elsevier/Gold Standard (2021-06-14 00:00:00) 

## 2021-10-07 NOTE — Progress Notes (Signed)
Patient seen by Dr. Benay Spice today  Vitals are within treatment parameters.  Labs reviewed by Dr. Benay Spice and are not all within treatment parameters. OK to treat with ALT 143  Per physician team, patient is ready for treatment and there are NO modifications to the treatment plan.

## 2021-10-07 NOTE — Progress Notes (Signed)
[facility] OFFICE PROGRESS NOTE   Diagnosis: Cholangiocarcinoma  INTERVAL HISTORY:   Jeffrey Blevins returns as scheduled.  He completed day 1 cycle 1 chemotherapy on 10/01/2021.  No fever, rash, nausea, or neuropathy symptoms.  He reports mild upper abdominal discomfort and bloating.  He takes ibuprofen as needed.  He would like to try different pain medication.  Objective:  Vital signs in last 24 hours:  Blood pressure 134/85, pulse 69, temperature 98.2 F (36.8 C), temperature source Oral, resp. rate 18, height '6\' 2"'$  (1.88 m), weight 186 lb (84.4 kg), SpO2 100 %.    HEENT: No thrush or ulcers Resp: Lungs clear bilaterally Cardio: Regular rate and rhythm GI: No hepatosplenomegaly, right upper quadrant biliary drain with a gauze dressing, nontender Vascular: No leg edema    Portacath/PICC-without erythema  Lab Results:  Lab Results  Component Value Date   WBC 4.9 10/07/2021   HGB 11.4 (L) 10/07/2021   HCT 33.7 (L) 10/07/2021   MCV 86.6 10/07/2021   PLT 215 10/07/2021   NEUTROABS 2.8 10/07/2021    CMP  Lab Results  Component Value Date   NA 136 09/30/2021   K 4.6 09/30/2021   CL 101 09/30/2021   CO2 23 09/30/2021   GLUCOSE 84 09/30/2021   BUN 16 09/30/2021   CREATININE 1.22 09/30/2021   CALCIUM 10.0 09/30/2021   PROT 7.1 09/30/2021   ALBUMIN 4.3 09/30/2021   AST 57 (H) 09/30/2021   ALT 91 (H) 09/30/2021   ALKPHOS 208 (H) 09/30/2021   BILITOT 1.2 09/30/2021   GFRNONAA >60 09/30/2021    Lab Results  Component Value Date   WYO378 159 (H) 09/30/2021     Medications: I have reviewed the patient's current medications.   Assessment/Plan:  Liver mass concerning for malignancy -CT abdomen/pelvis with contrast 08/14/1998 23-2.9 x 2.7 x 2.7 cm hypoenhancing mass in segment 5 near the liver hilum just above hepatic ductal confluence suspicious for primary bile duct malignancy or primary hepatic malignancy -MRCP 08/14/2021-irregular mass of segment 8 of  the liver with perilesional enhancement concerning for cholangiocarcinoma -08/14/2021 CA 19.9 was elevated at 76 -08/16/2021 AFP 6.4 -08/16/2021 cytology from bile duct brushing showed atypical cells -08/19/2021 cytology from bile duct brushing showed cells suspicious for malignancy -08/25/2021 CT-guided liver biopsy-moderately differentiated adenocarcinoma, CK7 positive, CDX2 positive in a small population of cells, TTF-1 negative, CK20 negative-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 -CTs at [facility] 09/06/2021-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  Obstructive jaundice -08/16/2021 percutaneous biliary drain placement 3.  Hepatic steatosis 4.  GERD 5.  Hypertension 6.  Normocytic anemia 7.  Enlarged prostate seen on CT 8.  Tobacco use 9.  Enterococcus and Aerococcus bacteremia 08/20/2021-discharged 08/26/2021 to complete outpatient course of Augmentin 10.  Peripheral neuropathy 11.  Port-A-Cath placement 09/23/2021 12.  Biliary drain exchange 09/23/2021 13.  Rigors following biliary drain procedure 09/23/2021, biliary drain culture-Enterobacter Cloacae, Bactrim x7 days 09/27/2021      Disposition: [name] tolerated the first cycle of chemotherapy well.  He will complete day 8 chemotherapy today.  He will receive G-CSF  tomorrow.  He will return for an office visit and cycle 2 chemotherapy in 2 weeks.  He is scheduled for follow-up in the biliary drain clinic on 10/12/2021. He will try tramadol as needed for  pain. Betsy Coder, MD  10/07/2021  8:43 AM

## 2021-10-08 ENCOUNTER — Other Ambulatory Visit: Payer: Self-pay

## 2021-10-08 ENCOUNTER — Inpatient Hospital Stay: Payer: Managed Care, Other (non HMO)

## 2021-10-08 VITALS — BP 124/80 | HR 60 | Temp 98.3°F | Resp 18

## 2021-10-08 DIAGNOSIS — Z5112 Encounter for antineoplastic immunotherapy: Secondary | ICD-10-CM | POA: Diagnosis not present

## 2021-10-08 DIAGNOSIS — C221 Intrahepatic bile duct carcinoma: Secondary | ICD-10-CM

## 2021-10-08 MED ORDER — PEGFILGRASTIM INJECTION 6 MG/0.6ML ~~LOC~~
6.0000 mg | PREFILLED_SYRINGE | Freq: Once | SUBCUTANEOUS | Status: AC
  Administered 2021-10-08: 6 mg via SUBCUTANEOUS

## 2021-10-08 NOTE — Patient Instructions (Signed)

## 2021-10-12 ENCOUNTER — Other Ambulatory Visit: Payer: Self-pay | Admitting: Internal Medicine

## 2021-10-12 ENCOUNTER — Ambulatory Visit (HOSPITAL_COMMUNITY)
Admission: RE | Admit: 2021-10-12 | Discharge: 2021-10-12 | Disposition: A | Discharge status: Home / Self Care | Payer: Managed Care, Other (non HMO) | Source: Ambulatory Visit | Attending: Radiology

## 2021-10-12 ENCOUNTER — Encounter (HOSPITAL_COMMUNITY): Payer: Self-pay

## 2021-10-12 ENCOUNTER — Ambulatory Visit (HOSPITAL_COMMUNITY)
Admission: RE | Admit: 2021-10-12 | Discharge: 2021-10-12 | Disposition: A | Discharge status: Home / Self Care | Payer: Managed Care, Other (non HMO) | Source: Ambulatory Visit | Attending: Radiology | Admitting: Radiology

## 2021-10-12 DIAGNOSIS — C221 Intrahepatic bile duct carcinoma: Secondary | ICD-10-CM | POA: Insufficient documentation

## 2021-10-12 HISTORY — PX: IR CHOLANGIOGRAM EXISTING TUBE: IMG6040

## 2021-10-12 LAB — COMPREHENSIVE METABOLIC PANEL
ALT: 199 U/L — ABNORMAL HIGH (ref 0–44)
AST: 67 U/L — ABNORMAL HIGH (ref 15–41)
Albumin: 3.8 g/dL (ref 3.5–5.0)
Alkaline Phosphatase: 228 U/L — ABNORMAL HIGH (ref 38–126)
Anion gap: 7 (ref 5–15)
BUN: 12 mg/dL (ref 6–20)
CO2: 23 mmol/L (ref 22–32)
Calcium: 9.3 mg/dL (ref 8.9–10.3)
Chloride: 103 mmol/L (ref 98–111)
Creatinine, Ser: 0.99 mg/dL (ref 0.61–1.24)
GFR, Estimated: >60 mL/min (ref >60)
Glucose, Bld: 112 mg/dL — ABNORMAL HIGH (ref 70–99)
Potassium: 4.1 mmol/L (ref 3.5–5.1)
Sodium: 133 mmol/L — ABNORMAL LOW (ref 135–145)
Total Bilirubin: 1.1 mg/dL (ref 0.3–1.2)
Total Protein: 7 g/dL (ref 6.5–8.1)

## 2021-10-12 MED ORDER — IOHEXOL 300 MG/ML  SOLN
50.0000 mL | Freq: Once | INTRAMUSCULAR | Status: AC | PRN
  Administered 2021-10-12: 8 mL

## 2021-10-12 NOTE — Procedures (Signed)
Interventional Radiology Procedure Note  Procedure: Image guided drain check, int/ext biliary drain.  Capped.   Findings: Drain in good position.  Tbili normal.  Draining normally.  Capped.   Complications: None  Recommendations: - Routine drain care on schedule next appt. - Capped.  He has bag at home and understands to place to gravity if any increasing fever, pain, concerns.    Signed,  Dulcy Fanny. Earleen Newport, DO

## 2021-10-15 ENCOUNTER — Encounter: Payer: Self-pay | Admitting: *Deleted

## 2021-10-15 ENCOUNTER — Telehealth: Payer: Self-pay | Admitting: *Deleted

## 2021-10-15 MED ORDER — PEGFILGRASTIM-CBQV 6 MG/0.6ML ~~LOC~~ SOSY: 6.0000 mg | PREFILLED_SYRINGE | SUBCUTANEOUS | 2 refills | Status: DC

## 2021-10-15 NOTE — Telephone Encounter (Signed)
Jeffrey Blevins called to report he has found someone to administer his Udenyca at home. Script sent to pharmacy to begin process.

## 2021-10-16 ENCOUNTER — Other Ambulatory Visit: Payer: Self-pay | Admitting: Oncology

## 2021-10-16 DIAGNOSIS — C221 Intrahepatic bile duct carcinoma: Secondary | ICD-10-CM

## 2021-10-16 NOTE — Progress Notes (Signed)
OFF PATHWAY REGIMEN - Other  No Change  Continue With Treatment as Ordered.  Original Decision Date/Time: 09/15/2021 13:48   [id number]:Cisplatin IV D1,8 + Durvalumab 1,500 mg IV D1 + Gemcitabine IV D1,8 q21 Days for up to 8 Cycles Followed by Durvalumab 1,500 mg IV D1 q28 Days:   Cycles 1 through up to 8: A cycle is every 21 days:     Durvalumab      Gemcitabine      Cisplatin    Cycles 9 and beyond: A cycle is every 28 days:     Durvalumab   **Always confirm dose/schedule in your pharmacy ordering system**  Patient Characteristics: Intent of Therapy: Non-Curative / Palliative Intent, Discussed with Patient

## 2021-10-18 ENCOUNTER — Telehealth: Payer: Self-pay | Admitting: *Deleted

## 2021-10-18 ENCOUNTER — Inpatient Hospital Stay: Payer: Managed Care, Other (non HMO) | Admitting: Oncology

## 2021-10-18 ENCOUNTER — Encounter: Payer: Self-pay | Admitting: *Deleted

## 2021-10-18 ENCOUNTER — Other Ambulatory Visit: Payer: Self-pay

## 2021-10-18 ENCOUNTER — Encounter (HOSPITAL_BASED_OUTPATIENT_CLINIC_OR_DEPARTMENT_OTHER): Payer: Managed Care, Other (non HMO)

## 2021-10-18 ENCOUNTER — Other Ambulatory Visit: Payer: Self-pay | Admitting: *Deleted

## 2021-10-18 VITALS — BP 135/79 | HR 85 | Temp 98.1°F | Resp 18 | Ht 74.0 in | Wt 199.8 lb

## 2021-10-18 DIAGNOSIS — C221 Intrahepatic bile duct carcinoma: Secondary | ICD-10-CM

## 2021-10-18 DIAGNOSIS — R600 Localized edema: Secondary | ICD-10-CM

## 2021-10-18 DIAGNOSIS — M79661 Pain in right lower leg: Secondary | ICD-10-CM

## 2021-10-18 DIAGNOSIS — Z5112 Encounter for antineoplastic immunotherapy: Secondary | ICD-10-CM | POA: Diagnosis not present

## 2021-10-18 MED ORDER — PEGFILGRASTIM INJECTION 6 MG/0.6ML ~~LOC~~: 6.0000 mg | PREFILLED_SYRINGE | Freq: Once | SUBCUTANEOUS | 1 refills | Status: AC

## 2021-10-18 NOTE — Progress Notes (Signed)
Per insurance patient must receive pegfilgrastim at home per PA team.  Per RN prescription has been sent to [facility] approved pharmacy. Plan day 3 removed.  Henreitta Leber, PharmD

## 2021-10-18 NOTE — Progress Notes (Signed)
Approval for Neulasta injection 6 mg approved from 10/18/21-01/18/22 per [employer].  Case number [id number]

## 2021-10-18 NOTE — Progress Notes (Signed)
Notified by Kristopher Oppenheim that Jeffrey Blevins is not a preferred drug for his insurance plan. Call to [facility] and preferred drugs are Neulasta and Ziextenzo.  Sent new script for Neulasta to his pharmacy.

## 2021-10-18 NOTE — Telephone Encounter (Signed)
Called to report swelling in RLL since Saturday from knee down. Warmer to touch than LLE and painful in knee area with weight bearing. Elevation does not help much. No fever and no cough or shortness of breath. MD notified.

## 2021-10-18 NOTE — Progress Notes (Signed)
[place] OFFICE PROGRESS NOTE   Diagnosis: Cholangiocarcinoma  INTERVAL HISTORY:   Jeffrey Blevins completed day 8 chemotherapy on 10/07/2021.  He returns today for an unscheduled visit.  He reports nausea for approximately 2 days following chemotherapy.  The biliary drain was capped on 10/12/2021.  Jeffrey Blevins developed acute pain at the medial aspect of the right knee joint/tibia beginning 10/16/2021.  He also noted swelling at the right lower leg.  No erythema or rash.  No trauma to the right leg.  The swelling and pain have improved.  No dyspnea.  Objective:  Vital signs in last 24 hours:  Blood pressure 135/79, pulse 85, temperature 98.1 F (36.7 C), temperature source Oral, resp. rate 18, height '6\' 2"'$  (1.88 m), weight 199 lb 12.8 oz (90.6 kg), SpO2 100 %.    HEENT: No thrush or ulcers Resp: Lungs clear bilaterally Cardio: Regular rate and rhythm GI: Right upper quadrant biliary drain site without evidence of infection, nontender, no hepatosplenomegaly Vascular: 1+ edema throughout the right leg beginning at the total thigh/knee.  Small right knee effusion.  No erythema or palpable cord.  Skin: No rash  Portacath/PICC-without erythema  Lab Results:  Lab Results  Component Value Date   WBC 4.9 10/07/2021   HGB 11.4 (L) 10/07/2021   HCT 33.7 (L) 10/07/2021   MCV 86.6 10/07/2021   PLT 215 10/07/2021   NEUTROABS 2.8 10/07/2021    CMP  Lab Results  Component Value Date   NA 133 (L) 10/12/2021   K 4.1 10/12/2021   CL 103 10/12/2021   CO2 23 10/12/2021   GLUCOSE 112 (H) 10/12/2021   BUN 12 10/12/2021   CREATININE 0.99 10/12/2021   CALCIUM 9.3 10/12/2021   PROT 7.0 10/12/2021   ALBUMIN 3.8 10/12/2021   AST 67 (H) 10/12/2021   ALT 199 (H) 10/12/2021   ALKPHOS 228 (H) 10/12/2021   BILITOT 1.1 10/12/2021   GFRNONAA >60 10/12/2021    Lab Results  Component Value Date   [id number] 159 (H) 09/30/2021     Medications: I have reviewed the patient's current  medications.   Assessment/Plan: Liver mass concerning for malignancy -CT abdomen/pelvis with contrast 08/14/1998 23-2.9 x 2.7 x 2.7 cm hypoenhancing mass in segment 5 near the liver hilum just above hepatic ductal confluence suspicious for primary bile duct malignancy or primary hepatic malignancy -MRCP 08/14/2021-irregular mass of segment 8 of the liver with perilesional enhancement concerning for cholangiocarcinoma -08/14/2021 CA 19.9 was elevated at 76 -08/16/2021 AFP 6.4 -08/16/2021 cytology from bile duct brushing showed atypical cells -08/19/2021 cytology from bile duct brushing showed cells suspicious for malignancy -08/25/2021 CT-guided liver biopsy-moderately differentiated adenocarcinoma, CK7 positive, CDX2 positive in a small population of cells, TTF-1 negative, CK20 negative-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 -CTs at [facility] 09/06/2021-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  Obstructive jaundice -08/16/2021 percutaneous biliary drain placement 3.  Hepatic steatosis 4.  GERD 5.  Hypertension 6.  Normocytic anemia 7.  Enlarged prostate seen on CT 8.  Tobacco use 9.  Enterococcus and Aerococcus bacteremia 08/20/2021-discharged 08/26/2021 to complete outpatient course  of Augmentin 10.  Peripheral neuropathy 11.  Port-A-Cath placement 09/23/2021 12.  Biliary drain exchange 09/23/2021 13.  Rigors following biliary drain procedure 09/23/2021, biliary drain culture-Enterobacter Cloacae, Bactrim x7 days  09/27/2021 14.  Right leg edema and pain 10/18/2021-Doppler negative for DVT       Disposition: Jeffrey Blevins is now a day 18 following cycle 1 gemcitabine/cisplatin/durvalumab.  He tolerated the treatment well.  He presents today with swelling and discomfort at the right lower leg.  A Doppler reveals no evidence of thrombosis.  It is possible the swelling is related to gemcitabine.  He will elevate the leg and use ice at the right knee.  He will return as scheduled for cycle 2 chemotherapy 10/21/2021.    Betsy Coder, MD  10/18/2021  12:26 PM

## 2021-10-20 ENCOUNTER — Other Ambulatory Visit: Payer: Self-pay

## 2021-10-21 ENCOUNTER — Inpatient Hospital Stay: Payer: Managed Care, Other (non HMO)

## 2021-10-21 ENCOUNTER — Encounter: Payer: Self-pay | Admitting: Nurse Practitioner

## 2021-10-21 ENCOUNTER — Inpatient Hospital Stay: Payer: Managed Care, Other (non HMO) | Admitting: Nurse Practitioner

## 2021-10-21 ENCOUNTER — Encounter: Payer: Self-pay | Admitting: *Deleted

## 2021-10-21 ENCOUNTER — Ambulatory Visit: Payer: Managed Care, Other (non HMO)

## 2021-10-21 VITALS — BP 132/81 | HR 76 | Temp 98.2°F | Resp 20 | Ht 74.0 in | Wt 197.6 lb

## 2021-10-21 DIAGNOSIS — C221 Intrahepatic bile duct carcinoma: Secondary | ICD-10-CM

## 2021-10-21 DIAGNOSIS — Z95828 Presence of other vascular implants and grafts: Secondary | ICD-10-CM

## 2021-10-21 DIAGNOSIS — Z5112 Encounter for antineoplastic immunotherapy: Secondary | ICD-10-CM | POA: Diagnosis not present

## 2021-10-21 LAB — CBC WITH DIFFERENTIAL (CANCER CENTER ONLY)
Abs Immature Granulocytes: 0.93 10*3/uL — ABNORMAL HIGH (ref 0.00–0.07)
Basophils Absolute: 0.1 10*3/uL (ref 0.0–0.1)
Basophils Relative: 1 %
Eosinophils Absolute: 0.1 10*3/uL (ref 0.0–0.5)
Eosinophils Relative: 1 %
HCT: 31.6 % — ABNORMAL LOW (ref 39.0–52.0)
Hemoglobin: 10.5 g/dL — ABNORMAL LOW (ref 13.0–17.0)
Immature Granulocytes: 8 %
Lymphocytes Relative: 22 %
Lymphs Abs: 2.5 10*3/uL (ref 0.7–4.0)
MCH: 29.5 pg (ref 26.0–34.0)
MCHC: 33.2 g/dL (ref 30.0–36.0)
MCV: 88.8 fL (ref 80.0–100.0)
Monocytes Absolute: 0.8 10*3/uL (ref 0.1–1.0)
Monocytes Relative: 7 %
Neutro Abs: 7.1 10*3/uL (ref 1.7–7.7)
Neutrophils Relative %: 61 %
Platelet Count: 518 10*3/uL — ABNORMAL HIGH (ref 150–400)
RBC: 3.56 MIL/uL — ABNORMAL LOW (ref 4.22–5.81)
RDW: 15.1 % (ref 11.5–15.5)
WBC Count: 11.5 10*3/uL — ABNORMAL HIGH (ref 4.0–10.5)
nRBC: 0 % (ref 0.0–0.2)

## 2021-10-21 LAB — CMP (CANCER CENTER ONLY)
ALT: 31 U/L (ref 0–44)
AST: 20 U/L (ref 15–41)
Albumin: 3.6 g/dL (ref 3.5–5.0)
Alkaline Phosphatase: 170 U/L — ABNORMAL HIGH (ref 38–126)
Anion gap: 8 (ref 5–15)
BUN: 9 mg/dL (ref 6–20)
CO2: 27 mmol/L (ref 22–32)
Calcium: 8.9 mg/dL (ref 8.9–10.3)
Chloride: 103 mmol/L (ref 98–111)
Creatinine: 0.94 mg/dL (ref 0.61–1.24)
GFR, Estimated: >60 mL/min (ref >60)
Glucose, Bld: 155 mg/dL — ABNORMAL HIGH (ref 70–99)
Potassium: 3.8 mmol/L (ref 3.5–5.1)
Sodium: 138 mmol/L (ref 135–145)
Total Bilirubin: 0.5 mg/dL (ref 0.3–1.2)
Total Protein: 6.4 g/dL — ABNORMAL LOW (ref 6.5–8.1)

## 2021-10-21 LAB — MAGNESIUM: Magnesium: 1.9 mg/dL (ref 1.7–2.4)

## 2021-10-21 MED ORDER — HEPARIN SOD (PORK) LOCK FLUSH 100 UNIT/ML IV SOLN
500.0000 [IU] | Freq: Once | INTRAVENOUS | Status: AC
  Administered 2021-10-21: 500 [IU] via INTRAVENOUS

## 2021-10-21 MED ORDER — SODIUM CHLORIDE 0.9% FLUSH
10.0000 mL | Freq: Once | INTRAVENOUS | Status: AC
  Administered 2021-10-21: 10 mL via INTRAVENOUS

## 2021-10-21 NOTE — Progress Notes (Addendum)
[place] OFFICE PROGRESS NOTE   Diagnosis: Cholangiocarcinoma  INTERVAL HISTORY:   [name] returns as scheduled.  He completed cycle 1 gemcitabine/cisplatin/durvalumab beginning 10/01/2021.  He notes more fatigue.  He had mild nausea.  Alteration in taste.  No mouth sores or diarrhea.  Leg swelling and pain have improved.  He notes some increase in numbness in both feet.  Objective:  Vital signs in last 24 hours:  Blood pressure 132/81, pulse 76, temperature 98.2 F (36.8 C), resp. rate 20, height '6\' 2"'$  (1.88 m), weight 197 lb 9.6 oz (89.6 kg), SpO2 100 %.    HEENT: No thrush or ulcers.  Sclera anicteric. Resp: Lungs clear bilaterally. Cardio: Regular rate and rhythm. GI: Abdomen soft and nontender.  No hepatomegaly.  Biliary drain site is without erythema. Vascular: Trace to 1+ edema right lower leg.  No erythema or palpable cord. Skin: No rash. Port-A-Cath without erythema.  Lab Results:  Lab Results  Component Value Date   WBC 4.9 10/07/2021   HGB 11.4 (L) 10/07/2021   HCT 33.7 (L) 10/07/2021   MCV 86.6 10/07/2021   PLT 215 10/07/2021   NEUTROABS 2.8 10/07/2021    Imaging:  No results found.  Medications: I have reviewed the patient's current medications.  Assessment/Plan: Liver mass concerning for malignancy -CT abdomen/pelvis with contrast 08/14/1998 23-2.9 x 2.7 x 2.7 cm hypoenhancing mass in segment 5 near the liver hilum just above hepatic ductal confluence suspicious for primary bile duct malignancy or primary hepatic malignancy -MRCP 08/14/2021-irregular mass of segment 8 of the liver with perilesional enhancement concerning for cholangiocarcinoma -08/14/2021 CA 19.9 was elevated at 76 -08/16/2021 AFP 6.4 -08/16/2021 cytology from bile duct brushing showed atypical cells -08/19/2021 cytology from bile duct brushing showed cells suspicious for malignancy -08/25/2021 CT-guided liver biopsy-moderately differentiated adenocarcinoma, CK7 positive,  CDX2 positive in a small population of cells, TTF-1 negative, CK20 negative-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 -[facility] 09/06/2021-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  Obstructive jaundice -08/16/2021 percutaneous biliary drain placement 3.  Hepatic steatosis 4.  GERD 5.  Hypertension 6.  Normocytic anemia 7.  Enlarged prostate seen on CT 8.  Tobacco use 9.  Enterococcus and Aerococcus bacteremia 08/20/2021-discharged 08/26/2021 to complete outpatient course of Augmentin 10.  Peripheral neuropathy 11.  Port-A-Cath placement 09/23/2021 12.  Biliary drain exchange 09/23/2021; drain capped 10/12/2021 13.  Rigors following biliary drain procedure 09/23/2021, biliary drain culture-Enterobacter Cloacae, Bactrim x7 days 09/27/2021 14.  Right leg edema and pain 10/18/2021-Doppler negative for DVT  Disposition: [name] appears stable.  He has completed 1 cycle of gemcitabine/cisplatin/durvalumab.  Plan to proceed with cycle 2 beginning 10/22/2021.  The right leg edema and pain have improved.  He understands this may be related to Gemcitabine and could recur.  He will return for day 8 10/28/2021.  We will see him in follow-up prior to cycle 3 on 11/11/2021.  He will contact the office in the interim with any problems.   Ned Card ANP/GNP-BC   10/21/2021  8:22 AM

## 2021-10-21 NOTE — Progress Notes (Signed)
Patient seen by Ned Card NP today  Vitals are within treatment parameters.  Labs reviewed by Ned Card NP and are within treatment parameters.  Per physician team, patient is ready for treatment and there are NO modifications to the treatment plan. OK to proceed w/chemo on 10/22/21

## 2021-10-21 NOTE — Patient Instructions (Signed)

## 2021-10-22 ENCOUNTER — Inpatient Hospital Stay: Payer: Managed Care, Other (non HMO)

## 2021-10-22 ENCOUNTER — Other Ambulatory Visit: Payer: Self-pay

## 2021-10-22 VITALS — BP 129/84 | HR 79 | Temp 98.1°F | Resp 18

## 2021-10-22 DIAGNOSIS — Z5112 Encounter for antineoplastic immunotherapy: Secondary | ICD-10-CM | POA: Diagnosis not present

## 2021-10-22 DIAGNOSIS — C221 Intrahepatic bile duct carcinoma: Secondary | ICD-10-CM

## 2021-10-22 MED ORDER — SODIUM CHLORIDE 0.9 % IV SOLN
1500.0000 mg | Freq: Once | INTRAVENOUS | Status: AC
  Administered 2021-10-22: 1500 mg via INTRAVENOUS
  Filled 2021-10-22: qty 30

## 2021-10-22 MED ORDER — MAGNESIUM SULFATE 2 GM/50ML IV SOLN
2.0000 g | Freq: Once | INTRAVENOUS | Status: AC
  Administered 2021-10-22: 2 g via INTRAVENOUS
  Filled 2021-10-22: qty 50

## 2021-10-22 MED ORDER — SODIUM CHLORIDE 0.9 % IV SOLN
10.0000 mg | Freq: Once | INTRAVENOUS | Status: AC
  Administered 2021-10-22: 10 mg via INTRAVENOUS
  Filled 2021-10-22: qty 1

## 2021-10-22 MED ORDER — SODIUM CHLORIDE 0.9 % IV SOLN
1000.0000 mg/m2 | Freq: Once | INTRAVENOUS | Status: AC
  Administered 2021-10-22: 2090 mg via INTRAVENOUS
  Filled 2021-10-22: qty 52.6

## 2021-10-22 MED ORDER — SODIUM CHLORIDE 0.9 % IV SOLN
150.0000 mg | Freq: Once | INTRAVENOUS | Status: AC
  Administered 2021-10-22: 150 mg via INTRAVENOUS
  Filled 2021-10-22: qty 5

## 2021-10-22 MED ORDER — HEPARIN SOD (PORK) LOCK FLUSH 100 UNIT/ML IV SOLN
500.0000 [IU] | Freq: Once | INTRAVENOUS | Status: AC | PRN
  Administered 2021-10-22: 500 [IU]

## 2021-10-22 MED ORDER — PALONOSETRON HCL INJECTION 0.25 MG/5ML
0.2500 mg | Freq: Once | INTRAVENOUS | Status: AC
  Administered 2021-10-22: 0.25 mg via INTRAVENOUS
  Filled 2021-10-22: qty 5

## 2021-10-22 MED ORDER — SODIUM CHLORIDE 0.9% FLUSH
10.0000 mL | INTRAVENOUS | Status: DC | PRN
  Administered 2021-10-22: 10 mL

## 2021-10-22 MED ORDER — SODIUM CHLORIDE 0.9 % IV SOLN: Freq: Once | INTRAVENOUS | Status: AC

## 2021-10-22 MED ORDER — POTASSIUM CHLORIDE IN NACL 20-0.9 MEQ/L-% IV SOLN
Freq: Once | INTRAVENOUS | Status: AC
  Filled 2021-10-22: qty 1000

## 2021-10-22 MED ORDER — SODIUM CHLORIDE 0.9 % IV SOLN
50.0000 mg | Freq: Once | INTRAVENOUS | Status: AC
  Administered 2021-10-22: 50 mg via INTRAVENOUS
  Filled 2021-10-22: qty 50

## 2021-10-22 NOTE — Patient Instructions (Signed)
[facility]  Discharge Instructions: Thank you for choosing [facility] to provide your oncology and hematology care.   If you have a lab appointment with the Cancer Center, please go directly to the Cancer Center and check in at the registration area.   Wear comfortable clothing and clothing appropriate for easy access to any Portacath or PICC line.   We strive to give you quality time with your provider. You may need to reschedule your appointment if you arrive late (15 or more minutes).  Arriving late affects you and other patients whose appointments are after yours.  Also, if you miss three or more appointments without notifying the office, you may be dismissed from the clinic at the provider's discretion.      For prescription refill requests, have your pharmacy contact our office and allow 72 hours for refills to be completed.    Today you received the following chemotherapy and/or immunotherapy agents: durvalumab, gemcitabine, cisplatin.       To help prevent nausea and vomiting after your treatment, we encourage you to take your nausea medication as directed.  BELOW ARE SYMPTOMS THAT SHOULD BE REPORTED IMMEDIATELY: *FEVER GREATER THAN 100.4 F (38 C) OR HIGHER *CHILLS OR SWEATING *NAUSEA AND VOMITING THAT IS NOT CONTROLLED WITH YOUR NAUSEA MEDICATION *UNUSUAL SHORTNESS OF BREATH *UNUSUAL BRUISING OR BLEEDING *URINARY PROBLEMS (pain or burning when urinating, or frequent urination) *BOWEL PROBLEMS (unusual diarrhea, constipation, pain near the anus) TENDERNESS IN MOUTH AND THROAT WITH OR WITHOUT PRESENCE OF ULCERS (sore throat, sores in mouth, or a toothache) UNUSUAL RASH, SWELLING OR PAIN  UNUSUAL VAGINAL DISCHARGE OR ITCHING   Items with * indicate a potential emergency and should be followed up as soon as possible or go to the Emergency Department if any problems should occur.  Please show the CHEMOTHERAPY ALERT CARD or IMMUNOTHERAPY  ALERT CARD at check-in to the Emergency Department and triage nurse.  Should you have questions after your visit or need to cancel or reschedule your appointment, please contact [facility]  Dept: [phone]  and follow the prompts.  Office hours are 8:00 a.m. to 4:30 p.m. Monday - Friday. Please note that voicemails left after 4:00 p.m. may not be returned until the following business day.  We are closed weekends and major holidays. You have access to a nurse at all times for urgent questions. Please call the main number to the clinic Dept: [phone] and follow the prompts.   For any non-urgent questions, you may also contact your provider using MyChart. We now offer e-Visits for anyone 18 and older to request care online for non-urgent symptoms. For details visit [email or web address].   Also download the MyChart app! Go to the app store, search "MyChart", open the app, select [facility], and log in with your MyChart username and password.  Masks are optional in the cancer centers. If you would like for your care team to wear a mask while they are taking care of you, please let them know. You may have one support person who is at least 57 years old accompany you for your appointments.  Durvalumab Injection What is this medication? DURVALUMAB (dur VAL ue mab) treats some types of cancer. It works by helping your immune system slow or stop the spread of cancer cells. It is a monoclonal antibody. This medicine may be used for other purposes; ask your health care provider or pharmacist if you have questions. COMMON BRAND NAME(S):   IMFINZI What should I tell my care team before I take this medication? They need to know if you have any of these conditions: Allogeneic stem cell transplant (uses someone else's stem cells) Autoimmune diseases, such as Crohn disease, ulcerative colitis, lupus History of chest radiation Nervous system problems, such as Guillain-Barre  syndrome, myasthenia gravis Organ transplant An unusual or allergic reaction to durvalumab, other medications, foods, dyes, or preservatives Pregnant or trying to get pregnant Breast-feeding How should I use this medication? This medication is infused into a vein. It is given by your care team in a hospital or clinic setting. A special MedGuide will be given to you before each treatment. Be sure to read this information carefully each time. Talk to your care team about the use of this medication in children. Special care may be needed. Overdosage: If you think you have taken too much of this medicine contact a poison control center or emergency room at once. NOTE: This medicine is only for you. Do not share this medicine with others. What if I miss a dose? Keep appointments for follow-up doses. It is important not to miss your dose. Call your care team if you are unable to keep an appointment. What may interact with this medication? Interactions have not been studied. This list may not describe all possible interactions. Give your health care provider a list of all the medicines, herbs, non-prescription drugs, or dietary supplements you use. Also tell them if you smoke, drink alcohol, or use illegal drugs. Some items may interact with your medicine. What should I watch for while using this medication? Your condition will be monitored carefully while you are receiving this medication. You may need blood work while taking this medication. This medication may cause serious skin reactions. They can happen weeks to months after starting the medication. Contact your care team right away if you notice fevers or flu-like symptoms with a rash. The rash may be red or purple and then turn into blisters or peeling of the skin. You may also notice a red rash with swelling of the face, lips, or lymph nodes in your neck or under your arms. Tell your care team right away if you have any change in your  eyesight. Talk to your care team if you may be pregnant. Serious birth defects can occur if you take this medication during pregnancy and for 3 months after the last dose. You will need a negative pregnancy test before starting this medication. Contraception is recommended while taking this medication and for 3 months after the last dose. Your care team can help you find the option that works for you. Do not breastfeed while taking this medication and for 3 months after the last dose. What side effects may I notice from receiving this medication? Side effects that you should report to your care team as soon as possible: Allergic reactions--skin rash, itching, hives, swelling of the face, lips, tongue, or throat Dry cough, shortness of breath or trouble breathing Eye pain, redness, irritation, or discharge with blurry or decreased vision Heart muscle inflammation--unusual weakness or fatigue, shortness of breath, chest pain, fast or irregular heartbeat, dizziness, swelling of the ankles, feet, or hands Hormone gland problems--headache, sensitivity to light, unusual weakness or fatigue, dizziness, fast or irregular heartbeat, increased sensitivity to cold or heat, excessive sweating, constipation, hair loss, increased thirst or amount of urine, tremors or shaking, irritability Infusion reactions--chest pain, shortness of breath or trouble breathing, feeling faint or lightheaded Kidney injury (glomerulonephritis)--decrease in  the amount of urine, red or dark brown urine, foamy or bubbly urine, swelling of the ankles, hands, or feet Liver injury--right upper belly pain, loss of appetite, nausea, light-colored stool, dark yellow or brown urine, yellowing skin or eyes, unusual weakness or fatigue Pain, tingling, or numbness in the hands or feet, muscle weakness, change in vision, confusion or trouble speaking, loss of balance or coordination, trouble walking, seizures Rash, fever, and swollen lymph  nodes Redness, blistering, peeling, or loosening of the skin, including inside the mouth Sudden or severe stomach pain, bloody diarrhea, fever, nausea, vomiting Side effects that usually do not require medical attention (report these to your care team if they continue or are bothersome): Bone, joint, or muscle pain Diarrhea Fatigue Loss of appetite Nausea Skin rash This list may not describe all possible side effects. Call your doctor for medical advice about side effects. You may report side effects to FDA at 1-800-FDA-1088. Where should I keep my medication? This medication is given in a hospital or clinic. It will not be stored at home. NOTE: This sheet is a summary. It may not cover all possible information. If you have questions about this medicine, talk to your doctor, pharmacist, or health care provider.  2023 Elsevier/Gold Standard (2020-08-05 00:00:00)   Gemcitabine Injection What is this medication? GEMCITABINE (jem SYE ta been) treats some types of cancer. It works by slowing down the growth of cancer cells. This medicine may be used for other purposes; ask your health care provider or pharmacist if you have questions. COMMON BRAND NAME(S): Gemzar, Infugem What should I tell my care team before I take this medication? They need to know if you have any of these conditions: Blood disorders Infection Kidney disease Liver disease Lung or breathing disease, such as asthma or COPD Recent or ongoing radiation therapy An unusual or allergic reaction to gemcitabine, other medications, foods, dyes, or preservatives If you or your partner are pregnant or trying to get pregnant Breast-feeding How should I use this medication? This medication is injected into a vein. It is given by your care team in a hospital or clinic setting. Talk to your care team about the use of this medication in children. Special care may be needed. Overdosage: If you think you have taken too much of this  medicine contact a poison control center or emergency room at once. NOTE: This medicine is only for you. Do not share this medicine with others. What if I miss a dose? Keep appointments for follow-up doses. It is important not to miss your dose. Call your care team if you are unable to keep an appointment. What may interact with this medication? Interactions have not been studied. This list may not describe all possible interactions. Give your health care provider a list of all the medicines, herbs, non-prescription drugs, or dietary supplements you use. Also tell them if you smoke, drink alcohol, or use illegal drugs. Some items may interact with your medicine. What should I watch for while using this medication? Your condition will be monitored carefully while you are receiving this medication. This medication may make you feel generally unwell. This is not uncommon, as chemotherapy can affect healthy cells as well as cancer cells. Report any side effects. Continue your course of treatment even though you feel ill unless your care team tells you to stop. In some cases, you may be given additional medications to help with side effects. Follow all directions for their use. This medication may increase your risk   of getting an infection. Call your care team for advice if you get a fever, chills, sore throat, or other symptoms of a cold or flu. Do not treat yourself. Try to avoid being around people who are sick. This medication may increase your risk to bruise or bleed. Call your care team if you notice any unusual bleeding. Be careful brushing or flossing your teeth or using a toothpick because you may get an infection or bleed more easily. If you have any dental work done, tell your dentist you are receiving this medication. Avoid taking medications that contain aspirin, acetaminophen, ibuprofen, naproxen, or ketoprofen unless instructed by your care team. These medications may hide a fever. Talk to  your care team if you or your partner wish to become pregnant or think you might be pregnant. This medication can cause serious birth defects if taken during pregnancy and for 6 months after the last dose. A negative pregnancy test is required before starting this medication. A reliable form of contraception is recommended while taking this medication and for 6 months after the last dose. Talk to your care team about effective forms of contraception. Do not father a child while taking this medication and for 3 months after the last dose. Use a condom while having sex during this time period. Do not breastfeed while taking this medication and for at least 1 week after the last dose. This medication may cause infertility. Talk to your care team if you are concerned about your fertility. What side effects may I notice from receiving this medication? Side effects that you should report to your care team as soon as possible: Allergic reactions--skin rash, itching, hives, swelling of the face, lips, tongue, or throat Capillary leak syndrome--stomach or muscle pain, unusual weakness or fatigue, feeling faint or lightheaded, decrease in the amount of urine, swelling of the ankles, hands, or feet, trouble breathing Infection--fever, chills, cough, sore throat, wounds that don't heal, pain or trouble when passing urine, general feeling of discomfort or being unwell Liver injury--right upper belly pain, loss of appetite, nausea, light-colored stool, dark yellow or brown urine, yellowing skin or eyes, unusual weakness or fatigue Low red blood cell level--unusual weakness or fatigue, dizziness, headache, trouble breathing Lung injury--shortness of breath or trouble breathing, cough, spitting up blood, chest pain, fever Stomach pain, bloody diarrhea, pale skin, unusual weakness or fatigue, decrease in the amount of urine, which may be signs of hemolytic uremic syndrome Sudden and severe headache, confusion, change in  vision, seizures, which may be signs of posterior reversible encephalopathy syndrome (PRES) Unusual bruising or bleeding Side effects that usually do not require medical attention (report to your care team if they continue or are bothersome): Diarrhea Drowsiness Hair loss Nausea Pain, redness, or swelling with sores inside the mouth or throat Vomiting This list may not describe all possible side effects. Call your doctor for medical advice about side effects. You may report side effects to FDA at 1-800-FDA-1088. Where should I keep my medication? This medication is given in a hospital or clinic. It will not be stored at home. NOTE: This sheet is a summary. It may not cover all possible information. If you have questions about this medicine, talk to your doctor, pharmacist, or health care provider.  2023 Elsevier/Gold Standard (2021-06-16 00:00:00)   Cisplatin Injection What is this medication? CISPLATIN (SIS pla tin) treats some types of cancer. It works by slowing down the growth of cancer cells. This medicine may be used for other purposes;   ask your health care provider or pharmacist if you have questions. COMMON BRAND NAME(S): Platinol, Platinol -AQ What should I tell my care team before I take this medication? They need to know if you have any of these conditions: Eye disease, vision problems Hearing problems Kidney disease Low blood counts, such as low white cells, platelets, or red blood cells Tingling of the fingers or toes, or other nerve disorder An unusual or allergic reaction to cisplatin, carboplatin, oxaliplatin, other medications, foods, dyes, or preservatives If you or your partner are pregnant or trying to get pregnant Breast-feeding How should I use this medication? This medication is injected into a vein. It is given by your care team in a hospital or clinic setting. Talk to your care team about the use of this medication in children. Special care may be  needed. Overdosage: If you think you have taken too much of this medicine contact a poison control center or emergency room at once. NOTE: This medicine is only for you. Do not share this medicine with others. What if I miss a dose? Keep appointments for follow-up doses. It is important not to miss your dose. Call your care team if you are unable to keep an appointment. What may interact with this medication? Do not take this medication with any of the following: Live virus vaccines This medication may also interact with the following: Certain antibiotics, such as amikacin, gentamicin, neomycin, polymyxin B, streptomycin, tobramycin, vancomycin Foscarnet This list may not describe all possible interactions. Give your health care provider a list of all the medicines, herbs, non-prescription drugs, or dietary supplements you use. Also tell them if you smoke, drink alcohol, or use illegal drugs. Some items may interact with your medicine. What should I watch for while using this medication? Your condition will be monitored carefully while you are receiving this medication. You may need blood work done while taking this medication. This medication may make you feel generally unwell. This is not uncommon, as chemotherapy can affect healthy cells as well as cancer cells. Report any side effects. Continue your course of treatment even though you feel ill unless your care team tells you to stop. This medication may increase your risk of getting an infection. Call your care team for advice if you get a fever, chills, sore throat, or other symptoms of a cold or flu. Do not treat yourself. Try to avoid being around people who are sick. Avoid taking medications that contain aspirin, acetaminophen, ibuprofen, naproxen, or ketoprofen unless instructed by your care team. These medications may hide a fever. This medication may increase your risk to bruise or bleed. Call your care team if you notice any unusual  bleeding. Be careful brushing or flossing your teeth or using a toothpick because you may get an infection or bleed more easily. If you have any dental work done, tell your dentist you are receiving this medication. Drink fluids as directed while you are taking this medication. This will help protect your kidneys. Call your care team if you get diarrhea. Do not treat yourself. Talk to your care team if you or your partner wish to become pregnant or think you might be pregnant. This medication can cause serious birth defects if taken during pregnancy and for 14 months after the last dose. A negative pregnancy test is required before starting this medication. A reliable form of contraception is recommended while taking this medication and for 14 months after the last dose. Talk to your care team about   effective forms of contraception. Do not father a child while taking this medication and for 11 months after the last dose. Use a condom during sex during this time period. Do not breast-feed while taking this medication. This medication may cause infertility. Talk to your care team if you are concerned about your fertility. What side effects may I notice from receiving this medication? Side effects that you should report to your care team as soon as possible: Allergic reactions--skin rash, itching, hives, swelling of the face, lips, tongue, or throat Eye pain, change in vision, vision loss Hearing loss, ringing in ears Infection--fever, chills, cough, sore throat, wounds that don't heal, pain or trouble when passing urine, general feeling of discomfort or being unwell Kidney injury--decrease in the amount of urine, swelling of the ankles, hands, or feet Low red blood cell level--unusual weakness or fatigue, dizziness, headache, trouble breathing Painful swelling, warmth, or redness of the skin, blisters or sores at the infusion site Pain, tingling, or numbness in the hands or feet Unusual bruising or  bleeding Side effects that usually do not require medical attention (report to your care team if they continue or are bothersome): Hair loss Nausea Vomiting This list may not describe all possible side effects. Call your doctor for medical advice about side effects. You may report side effects to FDA at 1-800-FDA-1088. Where should I keep my medication? This medication is given in a hospital or clinic. It will not be stored at home. NOTE: This sheet is a summary. It may not cover all possible information. If you have questions about this medicine, talk to your doctor, pharmacist, or health care provider.  2023 Elsevier/Gold Standard (2021-06-14 00:00:00) 

## 2021-10-23 ENCOUNTER — Other Ambulatory Visit: Payer: Self-pay

## 2021-10-26 ENCOUNTER — Other Ambulatory Visit: Payer: Self-pay

## 2021-10-28 ENCOUNTER — Inpatient Hospital Stay: Payer: Managed Care, Other (non HMO)

## 2021-10-28 ENCOUNTER — Other Ambulatory Visit: Payer: Self-pay | Admitting: Nurse Practitioner

## 2021-10-28 ENCOUNTER — Other Ambulatory Visit: Payer: Self-pay | Admitting: *Deleted

## 2021-10-28 ENCOUNTER — Encounter: Payer: Self-pay | Admitting: *Deleted

## 2021-10-28 VITALS — BP 133/79 | HR 74 | Temp 98.1°F | Resp 18 | Ht 74.0 in | Wt 197.2 lb

## 2021-10-28 DIAGNOSIS — C221 Intrahepatic bile duct carcinoma: Secondary | ICD-10-CM

## 2021-10-28 DIAGNOSIS — Z5112 Encounter for antineoplastic immunotherapy: Secondary | ICD-10-CM | POA: Diagnosis not present

## 2021-10-28 LAB — MAGNESIUM: Magnesium: 1.8 mg/dL (ref 1.7–2.4)

## 2021-10-28 LAB — CMP (CANCER CENTER ONLY)
ALT: 39 U/L (ref 0–44)
AST: 21 U/L (ref 15–41)
Albumin: 3.7 g/dL (ref 3.5–5.0)
Alkaline Phosphatase: 158 U/L — ABNORMAL HIGH (ref 38–126)
Anion gap: 8 (ref 5–15)
BUN: 13 mg/dL (ref 6–20)
CO2: 26 mmol/L (ref 22–32)
Calcium: 8.9 mg/dL (ref 8.9–10.3)
Chloride: 100 mmol/L (ref 98–111)
Creatinine: 0.76 mg/dL (ref 0.61–1.24)
GFR, Estimated: >60 mL/min (ref >60)
Glucose, Bld: 119 mg/dL — ABNORMAL HIGH (ref 70–99)
Potassium: 4 mmol/L (ref 3.5–5.1)
Sodium: 134 mmol/L — ABNORMAL LOW (ref 135–145)
Total Bilirubin: 0.6 mg/dL (ref 0.3–1.2)
Total Protein: 6.4 g/dL — ABNORMAL LOW (ref 6.5–8.1)

## 2021-10-28 LAB — CBC WITH DIFFERENTIAL (CANCER CENTER ONLY)
Abs Immature Granulocytes: 0.06 10*3/uL (ref 0.00–0.07)
Basophils Absolute: 0.1 10*3/uL (ref 0.0–0.1)
Basophils Relative: 1 %
Eosinophils Absolute: 0 10*3/uL (ref 0.0–0.5)
Eosinophils Relative: 1 %
HCT: 30 % — ABNORMAL LOW (ref 39.0–52.0)
Hemoglobin: 10 g/dL — ABNORMAL LOW (ref 13.0–17.0)
Immature Granulocytes: 1 %
Lymphocytes Relative: 26 %
Lymphs Abs: 1.6 10*3/uL (ref 0.7–4.0)
MCH: 29.4 pg (ref 26.0–34.0)
MCHC: 33.3 g/dL (ref 30.0–36.0)
MCV: 88.2 fL (ref 80.0–100.0)
Monocytes Absolute: 0.5 10*3/uL (ref 0.1–1.0)
Monocytes Relative: 8 %
Neutro Abs: 3.8 10*3/uL (ref 1.7–7.7)
Neutrophils Relative %: 63 %
Platelet Count: 412 10*3/uL — ABNORMAL HIGH (ref 150–400)
RBC: 3.4 MIL/uL — ABNORMAL LOW (ref 4.22–5.81)
RDW: 14.8 % (ref 11.5–15.5)
WBC Count: 6 10*3/uL (ref 4.0–10.5)
nRBC: 0 % (ref 0.0–0.2)

## 2021-10-28 MED ORDER — SODIUM CHLORIDE 0.9 % IV SOLN
150.0000 mg | Freq: Once | INTRAVENOUS | Status: AC
  Administered 2021-10-28: 150 mg via INTRAVENOUS
  Filled 2021-10-28: qty 150

## 2021-10-28 MED ORDER — MAGNESIUM SULFATE 2 GM/50ML IV SOLN
2.0000 g | Freq: Once | INTRAVENOUS | Status: AC
  Administered 2021-10-28: 2 g via INTRAVENOUS
  Filled 2021-10-28: qty 50

## 2021-10-28 MED ORDER — LORAZEPAM 0.5 MG PO TABS
ORAL_TABLET | ORAL | 0 refills | Status: DC
Start: 2021-10-28 — End: 2021-12-03

## 2021-10-28 MED ORDER — HEPARIN SOD (PORK) LOCK FLUSH 100 UNIT/ML IV SOLN
500.0000 [IU] | Freq: Once | INTRAVENOUS | Status: AC | PRN
  Administered 2021-10-28: 500 [IU]

## 2021-10-28 MED ORDER — POTASSIUM CHLORIDE IN NACL 20-0.9 MEQ/L-% IV SOLN
Freq: Once | INTRAVENOUS | Status: AC
  Filled 2021-10-28: qty 1000

## 2021-10-28 MED ORDER — SODIUM CHLORIDE 0.9 % IV SOLN
10.0000 mg | Freq: Once | INTRAVENOUS | Status: AC
  Administered 2021-10-28: 10 mg via INTRAVENOUS
  Filled 2021-10-28: qty 10

## 2021-10-28 MED ORDER — SODIUM CHLORIDE 0.9 % IV SOLN: Freq: Once | INTRAVENOUS | Status: AC

## 2021-10-28 MED ORDER — PALONOSETRON HCL INJECTION 0.25 MG/5ML
0.2500 mg | Freq: Once | INTRAVENOUS | Status: AC
  Administered 2021-10-28: 0.25 mg via INTRAVENOUS
  Filled 2021-10-28: qty 5

## 2021-10-28 MED ORDER — SODIUM CHLORIDE 0.9 % IV SOLN
1000.0000 mg/m2 | Freq: Once | INTRAVENOUS | Status: AC
  Administered 2021-10-28: 2090 mg via INTRAVENOUS
  Filled 2021-10-28: qty 52.6

## 2021-10-28 MED ORDER — SODIUM CHLORIDE 0.9 % IV SOLN
50.0000 mg | Freq: Once | INTRAVENOUS | Status: AC
  Administered 2021-10-28: 50 mg via INTRAVENOUS
  Filled 2021-10-28: qty 50

## 2021-10-28 MED ORDER — SODIUM CHLORIDE 0.9% FLUSH
10.0000 mL | INTRAVENOUS | Status: DC | PRN
  Administered 2021-10-28: 10 mL

## 2021-10-28 MED ORDER — PROCHLORPERAZINE MALEATE 10 MG PO TABS
10.0000 mg | ORAL_TABLET | Freq: Four times a day (QID) | ORAL | 1 refills | Status: DC | PRN
Start: 2021-10-28 — End: 2023-04-04

## 2021-10-28 MED ORDER — TRAMADOL HCL 50 MG PO TABS: 25.0000 mg | ORAL_TABLET | Freq: Four times a day (QID) | ORAL | 0 refills | Status: DC | PRN

## 2021-10-28 NOTE — Patient Instructions (Signed)
[facility]   Discharge Instructions: Thank you for choosing [facility] to provide your oncology and hematology care.   If you have a lab appointment with the Cancer Center, please go directly to the Cancer Center and check in at the registration area.   Wear comfortable clothing and clothing appropriate for easy access to any Portacath or PICC line.   We strive to give you quality time with your provider. You may need to reschedule your appointment if you arrive late (15 or more minutes).  Arriving late affects you and other patients whose appointments are after yours.  Also, if you miss three or more appointments without notifying the office, you may be dismissed from the clinic at the provider's discretion.      For prescription refill requests, have your pharmacy contact our office and allow 72 hours for refills to be completed.    Today you received the following chemotherapy and/or immunotherapy agents Gemzar, Cisplatin.      To help prevent nausea and vomiting after your treatment, we encourage you to take your nausea medication as directed.  BELOW ARE SYMPTOMS THAT SHOULD BE REPORTED IMMEDIATELY: *FEVER GREATER THAN 100.4 F (38 C) OR HIGHER *CHILLS OR SWEATING *NAUSEA AND VOMITING THAT IS NOT CONTROLLED WITH YOUR NAUSEA MEDICATION *UNUSUAL SHORTNESS OF BREATH *UNUSUAL BRUISING OR BLEEDING *URINARY PROBLEMS (pain or burning when urinating, or frequent urination) *BOWEL PROBLEMS (unusual diarrhea, constipation, pain near the anus) TENDERNESS IN MOUTH AND THROAT WITH OR WITHOUT PRESENCE OF ULCERS (sore throat, sores in mouth, or a toothache) UNUSUAL RASH, SWELLING OR PAIN  UNUSUAL VAGINAL DISCHARGE OR ITCHING   Items with * indicate a potential emergency and should be followed up as soon as possible or go to the Emergency Department if any problems should occur.  Please show the CHEMOTHERAPY ALERT CARD or IMMUNOTHERAPY ALERT CARD at  check-in to the Emergency Department and triage nurse.  Should you have questions after your visit or need to cancel or reschedule your appointment, please contact [facility]  Dept: [phone]  and follow the prompts.  Office hours are 8:00 a.m. to 4:30 p.m. Monday - Friday. Please note that voicemails left after 4:00 p.m. may not be returned until the following business day.  We are closed weekends and major holidays. You have access to a nurse at all times for urgent questions. Please call the main number to the clinic Dept: [phone] and follow the prompts.   For any non-urgent questions, you may also contact your provider using MyChart. We now offer e-Visits for anyone 18 and older to request care online for non-urgent symptoms. For details visit [email or web address].   Also download the MyChart app! Go to the app store, search "MyChart", open the app, select [facility], and log in with your MyChart username and password.  Masks are optional in the cancer centers. If you would like for your care team to wear a mask while they are taking care of you, please let them know. You may have one support person who is at least 57 years old accompany you for your appointments.  Gemcitabine Injection What is this medication? GEMCITABINE (jem SYE ta been) treats some types of cancer. It works by slowing down the growth of cancer cells. This medicine may be used for other purposes; ask your health care provider or pharmacist if you have questions. COMMON BRAND NAME(S): Gemzar, Infugem What should I tell my care team before I   take this medication? They need to know if you have any of these conditions: Blood disorders Infection Kidney disease Liver disease Lung or breathing disease, such as asthma or COPD Recent or ongoing radiation therapy An unusual or allergic reaction to gemcitabine, other medications, foods, dyes, or preservatives If you or your partner are  pregnant or trying to get pregnant Breast-feeding How should I use this medication? This medication is injected into a vein. It is given by your care team in a hospital or clinic setting. Talk to your care team about the use of this medication in children. Special care may be needed. Overdosage: If you think you have taken too much of this medicine contact a poison control center or emergency room at once. NOTE: This medicine is only for you. Do not share this medicine with others. What if I miss a dose? Keep appointments for follow-up doses. It is important not to miss your dose. Call your care team if you are unable to keep an appointment. What may interact with this medication? Interactions have not been studied. This list may not describe all possible interactions. Give your health care provider a list of all the medicines, herbs, non-prescription drugs, or dietary supplements you use. Also tell them if you smoke, drink alcohol, or use illegal drugs. Some items may interact with your medicine. What should I watch for while using this medication? Your condition will be monitored carefully while you are receiving this medication. This medication may make you feel generally unwell. This is not uncommon, as chemotherapy can affect healthy cells as well as cancer cells. Report any side effects. Continue your course of treatment even though you feel ill unless your care team tells you to stop. In some cases, you may be given additional medications to help with side effects. Follow all directions for their use. This medication may increase your risk of getting an infection. Call your care team for advice if you get a fever, chills, sore throat, or other symptoms of a cold or flu. Do not treat yourself. Try to avoid being around people who are sick. This medication may increase your risk to bruise or bleed. Call your care team if you notice any unusual bleeding. Be careful brushing or flossing your  teeth or using a toothpick because you may get an infection or bleed more easily. If you have any dental work done, tell your dentist you are receiving this medication. Avoid taking medications that contain aspirin, acetaminophen, ibuprofen, naproxen, or ketoprofen unless instructed by your care team. These medications may hide a fever. Talk to your care team if you or your partner wish to become pregnant or think you might be pregnant. This medication can cause serious birth defects if taken during pregnancy and for 6 months after the last dose. A negative pregnancy test is required before starting this medication. A reliable form of contraception is recommended while taking this medication and for 6 months after the last dose. Talk to your care team about effective forms of contraception. Do not father a child while taking this medication and for 3 months after the last dose. Use a condom while having sex during this time period. Do not breastfeed while taking this medication and for at least 1 week after the last dose. This medication may cause infertility. Talk to your care team if you are concerned about your fertility. What side effects may I notice from receiving this medication? Side effects that you should report to your care team   as soon as possible: Allergic reactions--skin rash, itching, hives, swelling of the face, lips, tongue, or throat Capillary leak syndrome--stomach or muscle pain, unusual weakness or fatigue, feeling faint or lightheaded, decrease in the amount of urine, swelling of the ankles, hands, or feet, trouble breathing Infection--fever, chills, cough, sore throat, wounds that don't heal, pain or trouble when passing urine, general feeling of discomfort or being unwell Liver injury--right upper belly pain, loss of appetite, nausea, light-colored stool, dark yellow or brown urine, yellowing skin or eyes, unusual weakness or fatigue Low red blood cell level--unusual weakness or  fatigue, dizziness, headache, trouble breathing Lung injury--shortness of breath or trouble breathing, cough, spitting up blood, chest pain, fever Stomach pain, bloody diarrhea, pale skin, unusual weakness or fatigue, decrease in the amount of urine, which may be signs of hemolytic uremic syndrome Sudden and severe headache, confusion, change in vision, seizures, which may be signs of posterior reversible encephalopathy syndrome (PRES) Unusual bruising or bleeding Side effects that usually do not require medical attention (report to your care team if they continue or are bothersome): Diarrhea Drowsiness Hair loss Nausea Pain, redness, or swelling with sores inside the mouth or throat Vomiting This list may not describe all possible side effects. Call your doctor for medical advice about side effects. You may report side effects to FDA at 1-800-FDA-1088. Where should I keep my medication? This medication is given in a hospital or clinic. It will not be stored at home. NOTE: This sheet is a summary. It may not cover all possible information. If you have questions about this medicine, talk to your doctor, pharmacist, or health care provider.  2023 Elsevier/Gold Standard (2021-06-16 00:00:00)  Cisplatin Injection What is this medication? CISPLATIN (SIS pla tin) treats some types of cancer. It works by slowing down the growth of cancer cells. This medicine may be used for other purposes; ask your health care provider or pharmacist if you have questions. COMMON BRAND NAME(S): Platinol, Platinol -AQ What should I tell my care team before I take this medication? They need to know if you have any of these conditions: Eye disease, vision problems Hearing problems Kidney disease Low blood counts, such as low white cells, platelets, or red blood cells Tingling of the fingers or toes, or other nerve disorder An unusual or allergic reaction to cisplatin, carboplatin, oxaliplatin, other medications,  foods, dyes, or preservatives If you or your partner are pregnant or trying to get pregnant Breast-feeding How should I use this medication? This medication is injected into a vein. It is given by your care team in a hospital or clinic setting. Talk to your care team about the use of this medication in children. Special care may be needed. Overdosage: If you think you have taken too much of this medicine contact a poison control center or emergency room at once. NOTE: This medicine is only for you. Do not share this medicine with others. What if I miss a dose? Keep appointments for follow-up doses. It is important not to miss your dose. Call your care team if you are unable to keep an appointment. What may interact with this medication? Do not take this medication with any of the following: Live virus vaccines This medication may also interact with the following: Certain antibiotics, such as amikacin, gentamicin, neomycin, polymyxin B, streptomycin, tobramycin, vancomycin Foscarnet This list may not describe all possible interactions. Give your health care provider a list of all the medicines, herbs, non-prescription drugs, or dietary supplements you use. Also   tell them if you smoke, drink alcohol, or use illegal drugs. Some items may interact with your medicine. What should I watch for while using this medication? Your condition will be monitored carefully while you are receiving this medication. You may need blood work done while taking this medication. This medication may make you feel generally unwell. This is not uncommon, as chemotherapy can affect healthy cells as well as cancer cells. Report any side effects. Continue your course of treatment even though you feel ill unless your care team tells you to stop. This medication may increase your risk of getting an infection. Call your care team for advice if you get a fever, chills, sore throat, or other symptoms of a cold or flu. Do not treat  yourself. Try to avoid being around people who are sick. Avoid taking medications that contain aspirin, acetaminophen, ibuprofen, naproxen, or ketoprofen unless instructed by your care team. These medications may hide a fever. This medication may increase your risk to bruise or bleed. Call your care team if you notice any unusual bleeding. Be careful brushing or flossing your teeth or using a toothpick because you may get an infection or bleed more easily. If you have any dental work done, tell your dentist you are receiving this medication. Drink fluids as directed while you are taking this medication. This will help protect your kidneys. Call your care team if you get diarrhea. Do not treat yourself. Talk to your care team if you or your partner wish to become pregnant or think you might be pregnant. This medication can cause serious birth defects if taken during pregnancy and for 14 months after the last dose. A negative pregnancy test is required before starting this medication. A reliable form of contraception is recommended while taking this medication and for 14 months after the last dose. Talk to your care team about effective forms of contraception. Do not father a child while taking this medication and for 11 months after the last dose. Use a condom during sex during this time period. Do not breast-feed while taking this medication. This medication may cause infertility. Talk to your care team if you are concerned about your fertility. What side effects may I notice from receiving this medication? Side effects that you should report to your care team as soon as possible: Allergic reactions--skin rash, itching, hives, swelling of the face, lips, tongue, or throat Eye pain, change in vision, vision loss Hearing loss, ringing in ears Infection--fever, chills, cough, sore throat, wounds that don't heal, pain or trouble when passing urine, general feeling of discomfort or being unwell Kidney  injury--decrease in the amount of urine, swelling of the ankles, hands, or feet Low red blood cell level--unusual weakness or fatigue, dizziness, headache, trouble breathing Painful swelling, warmth, or redness of the skin, blisters or sores at the infusion site Pain, tingling, or numbness in the hands or feet Unusual bruising or bleeding Side effects that usually do not require medical attention (report to your care team if they continue or are bothersome): Hair loss Nausea Vomiting This list may not describe all possible side effects. Call your doctor for medical advice about side effects. You may report side effects to FDA at 1-800-FDA-1088. Where should I keep my medication? This medication is given in a hospital or clinic. It will not be stored at home. NOTE: This sheet is a summary. It may not cover all possible information. If you have questions about this medicine, talk to your doctor, pharmacist, or   health care provider.  2023 [employer]vier/Gold Standard (2021-06-14 00:00:00) 

## 2021-10-28 NOTE — Progress Notes (Signed)
Patient in tx area today requesting refill on ativan, compazine and tramadol

## 2021-10-29 ENCOUNTER — Inpatient Hospital Stay: Payer: Managed Care, Other (non HMO) | Attending: Oncology

## 2021-11-03 ENCOUNTER — Other Ambulatory Visit: Payer: Self-pay | Admitting: Oncology

## 2021-11-03 ENCOUNTER — Other Ambulatory Visit: Payer: Self-pay | Admitting: Student

## 2021-11-03 DIAGNOSIS — C221 Intrahepatic bile duct carcinoma: Secondary | ICD-10-CM

## 2021-11-04 ENCOUNTER — Encounter (HOSPITAL_COMMUNITY): Payer: Self-pay

## 2021-11-04 ENCOUNTER — Other Ambulatory Visit: Payer: Self-pay | Admitting: Student

## 2021-11-04 ENCOUNTER — Ambulatory Visit (HOSPITAL_COMMUNITY)
Admission: RE | Admit: 2021-11-04 | Discharge: 2021-11-04 | Disposition: A | Discharge status: Home / Self Care | Payer: Managed Care, Other (non HMO) | Source: Ambulatory Visit | Attending: Diagnostic Radiology | Admitting: Diagnostic Radiology

## 2021-11-04 ENCOUNTER — Other Ambulatory Visit: Payer: Self-pay | Admitting: Radiology

## 2021-11-04 DIAGNOSIS — Z7901 Long term (current) use of anticoagulants: Secondary | ICD-10-CM | POA: Diagnosis not present

## 2021-11-04 DIAGNOSIS — I10 Essential (primary) hypertension: Secondary | ICD-10-CM | POA: Diagnosis not present

## 2021-11-04 DIAGNOSIS — C221 Intrahepatic bile duct carcinoma: Secondary | ICD-10-CM | POA: Diagnosis not present

## 2021-11-04 DIAGNOSIS — Z87891 Personal history of nicotine dependence: Secondary | ICD-10-CM | POA: Insufficient documentation

## 2021-11-04 DIAGNOSIS — K219 Gastro-esophageal reflux disease without esophagitis: Secondary | ICD-10-CM | POA: Diagnosis not present

## 2021-11-04 DIAGNOSIS — Z434 Encounter for attention to other artificial openings of digestive tract: Secondary | ICD-10-CM | POA: Insufficient documentation

## 2021-11-04 DIAGNOSIS — K831 Obstruction of bile duct: Secondary | ICD-10-CM | POA: Diagnosis not present

## 2021-11-04 HISTORY — PX: IR EXCHANGE BILIARY DRAIN: IMG6046

## 2021-11-04 MED ORDER — IOHEXOL 300 MG/ML  SOLN
50.0000 mL | Freq: Once | INTRAMUSCULAR | Status: AC | PRN
  Administered 2021-11-04: 10 mL

## 2021-11-04 MED ORDER — SODIUM CHLORIDE 0.9 % IV SOLN
2.0000 g | Freq: Once | INTRAVENOUS | Status: AC
  Administered 2021-11-04: 2 g via INTRAVENOUS
  Filled 2021-11-04: qty 12.5

## 2021-11-04 MED ORDER — MIDAZOLAM HCL 2 MG/2ML IJ SOLN
INTRAMUSCULAR | Status: AC | PRN
  Administered 2021-11-04: 2 mg via INTRAVENOUS

## 2021-11-04 MED ORDER — LIDOCAINE HCL 1 % IJ SOLN
INTRAMUSCULAR | Status: AC
  Filled 2021-11-04: qty 20

## 2021-11-04 MED ORDER — SODIUM CHLORIDE 0.9 % IV SOLN: INTRAVENOUS | Status: DC

## 2021-11-04 MED ORDER — FENTANYL CITRATE (PF) 100 MCG/2ML IJ SOLN
INTRAMUSCULAR | Status: AC | PRN
  Administered 2021-11-04: 50 ug via INTRAVENOUS

## 2021-11-04 MED ORDER — FENTANYL CITRATE (PF) 100 MCG/2ML IJ SOLN
INTRAMUSCULAR | Status: AC
  Filled 2021-11-04: qty 2

## 2021-11-04 MED ORDER — MIDAZOLAM HCL 2 MG/2ML IJ SOLN
INTRAMUSCULAR | Status: AC
  Filled 2021-11-04: qty 4

## 2021-11-04 MED ORDER — LIDOCAINE HCL 1 % IJ SOLN
INTRAMUSCULAR | Status: AC | PRN
  Administered 2021-11-04: 10 mL via INTRADERMAL

## 2021-11-04 NOTE — Discharge Instructions (Signed)
Continue with drain care as previously instructed  Follow up appointment should be scheduled in 6 weeks with you by Interventional Radiology department.  IR clinic [phone] as needed

## 2021-11-04 NOTE — Procedures (Signed)
Interventional Radiology Procedure:   Indications: Cholangiocarcinoma.  History of biliary obstruction and drain placement.   Procedure: Biliary drain exchange.  Findings: New 12 Fr drain placed, tip in duodenum.  Drained was capped.   Complications: No immediate complications noted.     EBL: Minimal  Plan: Keep drain capped and flush every other day. Anticipate another drain exchange or evaluation in 6-8 weeks after patient has completed chemotherapy and got follow up cross sectional imaging.    Kazuki Ingle R. Anselm Pancoast, MD  Pager: [phone]

## 2021-11-04 NOTE — H&P (Signed)
Chief Complaint: Patient was seen in consultation today for cholangiocarcinoma  Referring Physician(s): Dr. Julieanne Manson  Supervising Physician: Markus Daft  Patient Status: [name] - Out-pt  History of Present Illness: Jeffrey Blevins is a 56 y.o. male with history of cholangiocarcinoma diagnosed 07/2021 who is known to IR from [facility] with internal/external biliary drain placement 08/16/21.  Additional history includes GERD, hiatal hernia, Barrett's esophagus s/p dilation, Raynaud's, peripheral neuropahty, HTN.  He is on Eliquis at home.  He presents today for routine biliary drain exchange. He requests sedation with the procedure.   Patient assessed in [facility].  He reports no new concerns or symptoms at home.  He is on chemotherapy which has multiple side effects including chills, nausea, and right leg swelling. States this is typical of his chemo experience thus far.  No fever, chills, abdominal pain today.  He has been NPO. 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 His wife is available for care and transportation home today.   Of note, patient with history of rigors, chills, concern for sepsis at prior drain exchange 09/23/21 which required admission.  Plan to proceed with drain exchange with prophylactic antibiotic administration.   Past Medical History:  Diagnosis Date   Cholangiocarcinoma ([place])    Dx 07/2021.  Followed by Dr. Benay Spice, Dr. Fayrene Helper at [facility]   GERD (gastroesophageal reflux disease)    Hiatal hernia    History of Barrett's esophagus    per pt dx yrs ago, but with last egd none noted 05/ 2021   History of esophageal dilatation    per pt hx several times last one approx. 2009 then 05/ 2021  for stricture   Hypertension    followed by pcp   Raynauds phenomenon    Right hydrocele     Past Surgical History:  Procedure Laterality Date   COLONOSCOPY WITH ESOPHAGOGASTRODUODENOSCOPY (EGD)  07/16/2019   last one   INGUINAL HERNIA REPAIR  Bilateral    last one 1990s   IR CHOLANGIOGRAM EXISTING TUBE  10/12/2021   IR ENDOLUMINAL BX OF BILIARY TREE  08/19/2021   IR EXCHANGE BILIARY DRAIN  08/19/2021   IR EXCHANGE BILIARY DRAIN  09/23/2021   IR IMAGING GUIDED PORT INSERTION  09/23/2021   IR INT EXT BILIARY DRAIN WITH CHOLANGIOGRAM  08/16/2021   SPERMATOCELECTOMY Right 10/09/2020   Procedure: SPERMATOCELECTOMY;  Surgeon: Festus Aloe, MD;  Location: [facility];  Service: Urology;  Laterality: Right;    Allergies: Codeine  Medications: Prior to Admission medications   Medication Sig Start Date End Date Taking? Authorizing Provider  acetaminophen (TYLENOL) 500 MG tablet Take 1,000 mg by mouth 3 (three) times daily as needed for headache (pain).   Yes [provider]  amLODipine (NORVASC) 10 MG tablet Take 10 mg by mouth daily. 04/19/21  Yes [provider]  apixaban (ELIQUIS) 5 MG TABS tablet Take 1 tablet (5 mg total) by mouth 2 (two) times daily. 09/15/21  Yes Ladell Pier, MD  esomeprazole (NEXIUM) 20 MG capsule Take 20 mg by mouth every morning.   Yes [provider]  ibuprofen (ADVIL) 200 MG tablet Take 400 mg by mouth 3 (three) times daily as needed for headache (pain).   Yes [provider]  lidocaine-prilocaine (EMLA) cream Apply 1 Application topically as needed. 09/15/21  Yes Ladell Pier, MD  LORazepam (ATIVAN) 0.5 MG tablet Take 1 tablet as needed for insomnia 10/28/21  Yes Ladell Pier, MD  ondansetron (  ZOFRAN) 8 MG tablet Take 1 tablet (8 mg total) by mouth every 8 (eight) hours as needed for nausea or vomiting (Starting day 4 after chemo as needed). 09/15/21  Yes Ladell Pier, MD  prochlorperazine (COMPAZINE) 10 MG tablet Take 1 tablet (10 mg total) by mouth every 6 (six) hours as needed for nausea or vomiting. 10/28/21  Yes Ladell Pier, MD  traMADol (ULTRAM) 50 MG tablet Take 0.5-1 tablets (25-50 mg total) by mouth every 6 (six) hours as needed.  10/28/21  Yes Owens Shark, NP  irbesartan (AVAPRO) 150 MG tablet Take 150 mg by mouth daily. Patient not taking: Reported on 09/30/2021    [provider]  sulfamethoxazole-trimethoprim (BACTRIM DS) 800-160 MG tablet Take 1 tablet by mouth 2 (two) times daily. 09/27/21   Donita Brooks, NP     Family History  Problem Relation Age of Onset   Breast cancer Mother    Stroke Father    Breast cancer Sister    Cardiomyopathy Brother        Cardiomegaly    Social History   Socioeconomic History   Marital status: Married    Spouse name: Not on file   Number of children: Not on file   Years of education: Not on file   Highest education level: Not on file  Occupational History   Not on file  Tobacco Use   Smoking status: Former    Years: 5.00    Types: Cigarettes   Smokeless tobacco: Never   Tobacco comments:    10-06-2020  per pt 1pp 7days  Vaping Use   Vaping Use: Never used  Substance and Sexual Activity   Alcohol use: Yes    Alcohol/week: 4.0 standard drinks of alcohol    Types: 4 Cans of beer per week   Drug use: Never   Sexual activity: Not on file  Other Topics Concern   Not on file  Social History Narrative   Not on file   Social Determinants of Health   Financial Resource Strain: Not on file  Food Insecurity: Not on file  Transportation Needs: Not on file  Physical Activity: Not on file  Stress: Not on file  Social Connections: Not on file     Review of Systems: A 12 point ROS discussed and pertinent positives are indicated in the HPI above.  All other systems are negative.  Review of Systems  Constitutional:  Positive for chills. Negative for fatigue and fever.  Respiratory:  Negative for cough and shortness of breath.   Cardiovascular:  Negative for chest pain.  Gastrointestinal:  Positive for nausea. Negative for abdominal pain.  Genitourinary:  Negative for dysuria.  Musculoskeletal:  Positive for joint swelling (right knee). Negative for  back pain.  Psychiatric/Behavioral:  Negative for behavioral problems and confusion.     Vital Signs: BP 120/81   Pulse 76   Temp 98.4 F (36.9 C) (Oral)   Resp 16   SpO2 96%   Physical Exam Vitals and nursing note reviewed.  Constitutional:      General: He is not in acute distress.    Appearance: Normal appearance. He is not ill-appearing.  HENT:     Mouth/Throat:     Mouth: Mucous membranes are moist.     Pharynx: Oropharynx is clear.  Cardiovascular:     Rate and Rhythm: Normal rate and regular rhythm.  Pulmonary:     Effort: Pulmonary effort is normal.     Breath sounds: Normal  breath sounds.  Abdominal:     General: Abdomen is flat.     Palpations: Abdomen is soft.     Comments: Biliary drain in place, capped.   Skin:    General: Skin is warm and dry.  Neurological:     General: No focal deficit present.     Mental Status: He is alert and oriented to person, place, and time.  Psychiatric:        Mood and Affect: Mood normal.        Behavior: Behavior normal.      MD Evaluation Airway: WNL Heart: WNL Abdomen: WNL Chest/ Lungs: WNL ASA  Classification: 3 Mallampati/Airway Score: One   Imaging: IR CHOLANGIOGRAM EXISTING TUBE  Result Date: 10/12/2021 INDICATION: 56 year old male with biliary obstruction, prior internal external biliary drainage EXAM: CHOLANGIOGRAM VIA EXISTING CATHETER MEDICATIONS: None ANESTHESIA/SEDATION: None FLUOROSCOPY TIME:  Fluoroscopy Time:  (1 mGy). COMPLICATIONS: None PROCEDURE: Informed written consent was obtained from the patient after a thorough discussion of the procedural risks, benefits and alternatives. All questions were addressed. Maximal Sterile Barrier Technique was utilized including caps, mask, sterile gowns, sterile gloves, sterile drape, hand hygiene and skin antiseptic. A timeout was performed prior to the initiation of the procedure. Patient positioned supine position under the image intensifier. Clean technique was  used for drain injection of the internal/external biliary drain. The drain is patent and functional, terminating in the duodenum beyond the ampulla. The drain was capped and instructions were reviewed. Patient tolerated the procedure well and remained hemodynamically stable throughout. No complications or blood loss. IMPRESSION: Drain injection of internal/external biliary drain reveals patent drain in appropriate position. The drain was capped. Signed, Dulcy Fanny. Nadene Rubins, RPVI [facility] PLAN: The patient will have a routine drain exchange in September in a few weeks. Electronically Signed   By: Corrie Mckusick D.O.   On: 10/12/2021 15:57    Labs:  CBC: Recent Labs    09/30/21 0808 10/07/21 0808 10/21/21 0810 10/28/21 0803  WBC 7.7 4.9 11.5* 6.0  HGB 12.7* 11.4* 10.5* 10.0*  HCT 37.8* 33.7* 31.6* 30.0*  PLT 313 215 518* 412*    COAGS: Recent Labs    08/13/21 1945 08/16/21 0437 08/19/21 0536 08/25/21 0524  INR 0.9 0.8 0.9 1.1    BMP: Recent Labs    10/07/21 0808 10/12/21 1340 10/21/21 0810 10/28/21 0803  NA 135 133* 138 134*  K 3.9 4.1 3.8 4.0  CL 102 103 103 100  CO2 23 23 27 26   GLUCOSE 116* 112* 155* 119*  BUN 16 12 9 13   CALCIUM 9.0 9.3 8.9 8.9  CREATININE 0.90 0.99 0.94 0.76  GFRNONAA >60 >60 >60 >60    LIVER FUNCTION TESTS: Recent Labs    10/07/21 0808 10/12/21 1340 10/21/21 0810 10/28/21 0803  BILITOT 1.1 1.1 0.5 0.6  AST 73* 67* 20 21  ALT 143* 199* 31 39  ALKPHOS 177* 228* 170* 158*  PROT 7.0 7.0 6.4* 6.4*  ALBUMIN 4.2 3.8 3.6 3.7    TUMOR MARKERS: No results for input(s): "AFPTM", "CEA", "CA199", "CHROMGRNA" in the last 8760 hours.  Assessment and Plan: Cholangiocarcinoma biliary drain placement 08/16/21 presents for routine drain exchange after capping trial.  Case reviewed by Dr. Anselm Pancoast who approves patient for procedure and is familiar with his history of potential sepsis with  prior drain exchange.  Patient presents today in their usual state of health. He does have symptoms related to his most recent  chemo administration including chills, nausea, and right knee swelling which he reports is stable/started to improve.  He has been NPO and is not currently on blood thinners. Will plan to proceed with drain exchange with sedation.  Patient to be given maxipime 1 hr prior to procedure.   Risks and benefits discussed with the patient including, but not limited to bleeding, infection which may lead to sepsis or even death and damage to adjacent structures.  This interventional procedure involves the use of X-rays and because of the nature of the planned procedure, it is possible that we will have prolonged use of X-ray fluoroscopy.  Potential radiation risks to you include (but are not limited to) the following: - A slightly elevated risk for cancer  several years later in life. This risk is typically less than 0.5% percent. This risk is low in comparison to the normal incidence of human cancer, which is 33% for women and 50% for men according to the [place]. - Radiation induced injury can include skin redness, resembling a rash, tissue breakdown / ulcers and hair loss (which can be temporary or permanent).   The likelihood of either of these occurring depends on the difficulty of the procedure and whether you are sensitive to radiation due to previous procedures, disease, or genetic conditions.   IF your procedure requires a prolonged use of radiation, you will be notified and given written instructions for further action.  It is your responsibility to monitor the irradiated area for the 2 weeks following the procedure and to notify your physician if you are concerned that you have suffered a radiation induced injury.    All of the patient's questions were answered, patient is agreeable to proceed.  Consent signed and in chart.  Thank you for this  interesting consult.  I greatly enjoyed meeting SARAH ZERBY and look forward to participating in their care.  A copy of this report was sent to the requesting provider on this date.  Electronically Signed: Docia Barrier, PA 11/04/2021, 11:38 AM   I spent a total of    15 Minutes in face to face in clinical consultation, greater than 50% of which was counseling/coordinating care for cholangiocarcinoma.

## 2021-11-11 ENCOUNTER — Encounter: Payer: Self-pay | Admitting: Nurse Practitioner

## 2021-11-11 ENCOUNTER — Inpatient Hospital Stay: Payer: Managed Care, Other (non HMO) | Admitting: Nurse Practitioner

## 2021-11-11 ENCOUNTER — Inpatient Hospital Stay: Payer: Managed Care, Other (non HMO)

## 2021-11-11 ENCOUNTER — Inpatient Hospital Stay: Payer: Managed Care, Other (non HMO) | Attending: Oncology

## 2021-11-11 VITALS — BP 140/90 | HR 76 | Temp 98.1°F | Resp 18 | Ht 74.0 in | Wt 197.4 lb

## 2021-11-11 DIAGNOSIS — Z5111 Encounter for antineoplastic chemotherapy: Secondary | ICD-10-CM | POA: Diagnosis present

## 2021-11-11 DIAGNOSIS — Z5112 Encounter for antineoplastic immunotherapy: Secondary | ICD-10-CM | POA: Insufficient documentation

## 2021-11-11 DIAGNOSIS — C221 Intrahepatic bile duct carcinoma: Secondary | ICD-10-CM | POA: Insufficient documentation

## 2021-11-11 DIAGNOSIS — Z79899 Other long term (current) drug therapy: Secondary | ICD-10-CM | POA: Insufficient documentation

## 2021-11-11 DIAGNOSIS — Z95828 Presence of other vascular implants and grafts: Secondary | ICD-10-CM

## 2021-11-11 LAB — CBC WITH DIFFERENTIAL (CANCER CENTER ONLY)
Abs Immature Granulocytes: 0.2 10*3/uL — ABNORMAL HIGH (ref 0.00–0.07)
Basophils Absolute: 0.1 10*3/uL (ref 0.0–0.1)
Basophils Relative: 1 %
Eosinophils Absolute: 0.2 10*3/uL (ref 0.0–0.5)
Eosinophils Relative: 3 %
HCT: 31.2 % — ABNORMAL LOW (ref 39.0–52.0)
Hemoglobin: 9.9 g/dL — ABNORMAL LOW (ref 13.0–17.0)
Immature Granulocytes: 3 %
Lymphocytes Relative: 25 %
Lymphs Abs: 1.9 10*3/uL (ref 0.7–4.0)
MCH: 28.2 pg (ref 26.0–34.0)
MCHC: 31.7 g/dL (ref 30.0–36.0)
MCV: 88.9 fL (ref 80.0–100.0)
Monocytes Absolute: 0.8 10*3/uL (ref 0.1–1.0)
Monocytes Relative: 10 %
Neutro Abs: 4.4 10*3/uL (ref 1.7–7.7)
Neutrophils Relative %: 58 %
Platelet Count: 369 10*3/uL (ref 150–400)
RBC: 3.51 MIL/uL — ABNORMAL LOW (ref 4.22–5.81)
RDW: 16.5 % — ABNORMAL HIGH (ref 11.5–15.5)
WBC Count: 7.6 10*3/uL (ref 4.0–10.5)
nRBC: 0 % (ref 0.0–0.2)

## 2021-11-11 LAB — MAGNESIUM: Magnesium: 1.8 mg/dL (ref 1.7–2.4)

## 2021-11-11 LAB — CMP (CANCER CENTER ONLY)
ALT: 18 U/L (ref 0–44)
AST: 19 U/L (ref 15–41)
Albumin: 4 g/dL (ref 3.5–5.0)
Alkaline Phosphatase: 234 U/L — ABNORMAL HIGH (ref 38–126)
Anion gap: 8 (ref 5–15)
BUN: 10 mg/dL (ref 6–20)
CO2: 27 mmol/L (ref 22–32)
Calcium: 9.1 mg/dL (ref 8.9–10.3)
Chloride: 99 mmol/L (ref 98–111)
Creatinine: 0.83 mg/dL (ref 0.61–1.24)
GFR, Estimated: >60 mL/min (ref >60)
Glucose, Bld: 114 mg/dL — ABNORMAL HIGH (ref 70–99)
Potassium: 4.1 mmol/L (ref 3.5–5.1)
Sodium: 134 mmol/L — ABNORMAL LOW (ref 135–145)
Total Bilirubin: 0.4 mg/dL (ref 0.3–1.2)
Total Protein: 6.7 g/dL (ref 6.5–8.1)

## 2021-11-11 LAB — TSH: TSH: 3.087 u[IU]/mL (ref 0.350–4.500)

## 2021-11-11 MED ORDER — SODIUM CHLORIDE 0.9% FLUSH
10.0000 mL | Freq: Once | INTRAVENOUS | Status: AC
  Administered 2021-11-11: 10 mL via INTRAVENOUS

## 2021-11-11 MED ORDER — HEPARIN SOD (PORK) LOCK FLUSH 100 UNIT/ML IV SOLN
500.0000 [IU] | Freq: Once | INTRAVENOUS | Status: AC
  Administered 2021-11-11: 500 [IU] via INTRAVENOUS

## 2021-11-11 NOTE — Patient Instructions (Signed)

## 2021-11-11 NOTE — Progress Notes (Signed)
[facility] OFFICE PROGRESS NOTE   Diagnosis: Cholangiocarcinoma  INTERVAL HISTORY:   Jeffrey Blevins returns as scheduled.  He completed cycle 2 gemcitabine/cisplatin/durvalumab beginning 10/22/2021.  He denies nausea/vomiting.  No mouth sores.  No diarrhea.  He has noted a few red itchy bumps at the lower legs, question mosquito bites.  He noted that the right leg again became edematous and painful after day 8 Gemcitabine.  Symptoms were not as severe as when this occurred the first time.  No hearing loss.  No tinnitus.  Baseline neuropathy symptoms have increased.  Before chemotherapy he had chronic numbness in the toes.  He has recently noticed the numbness to now be involving one half of the distal foot.  Symptoms do not interfere with activity.  No balance issues.  Objective:  Vital signs in last 24 hours:  Blood pressure (!) 140/90, pulse 76, temperature 98.1 F (36.7 C), resp. rate 18, height '6\' 2"'$  (1.88 m), weight 197 lb 6.4 oz (89.5 kg), SpO2 100 %.    HEENT: No thrush or ulcers. Resp: Lungs clear bilaterally. Cardio: Regular rate and rhythm. GI: Abdomen soft and nontender.  No hepatosplenomegaly. Vascular: Trace edema right lower leg. Neuro: Vibratory sense minimally decreased over the fingertips per tuning fork exam. Skin: A few small raised erythematous lesions at the lower leg bilaterally. Port-A-Cath without erythema.  Lab Results:  Lab Results  Component Value Date   WBC 7.6 11/11/2021   HGB 9.9 (L) 11/11/2021   HCT 31.2 (L) 11/11/2021   MCV 88.9 11/11/2021   PLT 369 11/11/2021   NEUTROABS 4.4 11/11/2021    Imaging:  No results found.  Medications: I have reviewed the patient's current medications.  Assessment/Plan: Liver mass concerning for malignancy -CT abdomen/pelvis with contrast 08/14/1998 23-2.9 x 2.7 x 2.7 cm hypoenhancing mass in segment 5 near the liver hilum just above hepatic ductal confluence suspicious for primary bile duct  malignancy or primary hepatic malignancy -MRCP 08/14/2021-irregular mass of segment 8 of the liver with perilesional enhancement concerning for cholangiocarcinoma -08/14/2021 CA 19.9 was elevated at 76 -08/16/2021 AFP 6.4 -08/16/2021 cytology from bile duct brushing showed atypical cells -08/19/2021 cytology from bile duct brushing showed cells suspicious for malignancy -08/25/2021 CT-guided liver biopsy-moderately differentiated adenocarcinoma, CK7 positive, CDX2 positive in a small population of cells, TTF-1 negative, CK20 negative-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 -CTs at [facility] 09/06/2021-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  Obstructive jaundice -08/16/2021 percutaneous biliary drain placement 3.  Hepatic steatosis 4.  GERD 5.  Hypertension 6.  Normocytic anemia 7.  Enlarged prostate seen on CT 8.  Tobacco use 9.  Enterococcus and Aerococcus bacteremia 08/20/2021-discharged 08/26/2021 to complete outpatient course of Augmentin 10.  Peripheral neuropathy 11.  Port-A-Cath placement 09/23/2021 12.  Biliary drain exchange 09/23/2021; drain capped 10/12/2021 13.  Rigors following biliary drain procedure 09/23/2021, biliary drain culture-Enterobacter Cloacae, Bactrim x7 days 09/27/2021 14.  Right leg edema and pain 10/18/2021-Doppler negative for DVT    Disposition: [name] appears stable.  He has completed 2 cycles of gemcitabine/cisplatin/durvalumab.  At today's visit he reports progressive neuropathy symptoms involving the feet.  We decided to hold cisplatin with treatment tomorrow, plan for gemcitabine and durvalumab.  Jeffrey Blevins agrees with this plan.  CBC and chemistry panel reviewed.  Labs adequate to proceed with gemcitabine and durvalumab.  We will see him in follow-up prior to treatment on 11/18/2021 to reevaluate neuropathy symptoms.    Ned Card ANP/GNP-BC   11/11/2021  8:53 AM

## 2021-11-12 ENCOUNTER — Inpatient Hospital Stay: Payer: Managed Care, Other (non HMO)

## 2021-11-12 ENCOUNTER — Other Ambulatory Visit: Payer: Self-pay

## 2021-11-12 VITALS — BP 137/81 | HR 71 | Resp 18

## 2021-11-12 DIAGNOSIS — Z5112 Encounter for antineoplastic immunotherapy: Secondary | ICD-10-CM | POA: Diagnosis not present

## 2021-11-12 DIAGNOSIS — C221 Intrahepatic bile duct carcinoma: Secondary | ICD-10-CM

## 2021-11-12 LAB — T4: T4, Total: 8 ug/dL (ref 4.5–12.0)

## 2021-11-12 MED ORDER — PROCHLORPERAZINE MALEATE 10 MG PO TABS
10.0000 mg | ORAL_TABLET | Freq: Once | ORAL | Status: AC
  Administered 2021-11-12: 10 mg via ORAL
  Filled 2021-11-12: qty 1

## 2021-11-12 MED ORDER — SODIUM CHLORIDE 0.9% FLUSH
10.0000 mL | INTRAVENOUS | Status: DC | PRN
  Administered 2021-11-12: 10 mL

## 2021-11-12 MED ORDER — SODIUM CHLORIDE 0.9 % IV SOLN
1000.0000 mg/m2 | Freq: Once | INTRAVENOUS | Status: AC
  Administered 2021-11-12: 2090 mg via INTRAVENOUS
  Filled 2021-11-12: qty 52.6

## 2021-11-12 MED ORDER — HEPARIN SOD (PORK) LOCK FLUSH 100 UNIT/ML IV SOLN
500.0000 [IU] | Freq: Once | INTRAVENOUS | Status: AC | PRN
  Administered 2021-11-12: 500 [IU]

## 2021-11-12 MED ORDER — SODIUM CHLORIDE 0.9 % IV SOLN
1500.0000 mg | Freq: Once | INTRAVENOUS | Status: AC
  Administered 2021-11-12: 1500 mg via INTRAVENOUS
  Filled 2021-11-12: qty 30

## 2021-11-12 MED ORDER — SODIUM CHLORIDE 0.9 % IV SOLN: Freq: Once | INTRAVENOUS | Status: AC

## 2021-11-12 NOTE — Patient Instructions (Signed)
Jeffrey Blevins   Discharge Instructions: Thank you for choosing [facility] to provide your oncology and hematology care.   If you have a lab appointment with the [facility], please go directly to the [facility] and check in at the registration area.   Wear comfortable clothing and clothing appropriate for easy access to any Portacath or PICC line.   We strive to give you quality time with your provider. You may need to reschedule your appointment if you arrive late (15 or more minutes).  Arriving late affects you and other patients whose appointments are after yours.  Also, if you miss three or more appointments without notifying the office, you may be dismissed from the clinic at the provider's discretion.      For prescription refill requests, have your pharmacy contact our office and allow 72 hours for refills to be completed.    Today you received the following chemotherapy and/or immunotherapy agents Imfinzi, Gemzar      To help prevent nausea and vomiting after your treatment, we encourage you to take your nausea medication as directed.  BELOW ARE SYMPTOMS THAT SHOULD BE REPORTED IMMEDIATELY: *FEVER GREATER THAN 100.4 F (38 C) OR HIGHER *CHILLS OR SWEATING *NAUSEA AND VOMITING THAT IS NOT CONTROLLED WITH YOUR NAUSEA MEDICATION *UNUSUAL SHORTNESS OF BREATH *UNUSUAL BRUISING OR BLEEDING *URINARY PROBLEMS (pain or burning when urinating, or frequent urination) *BOWEL PROBLEMS (unusual diarrhea, constipation, pain near the anus) TENDERNESS IN MOUTH AND THROAT WITH OR WITHOUT PRESENCE OF ULCERS (sore throat, sores in mouth, or a toothache) UNUSUAL RASH, SWELLING OR PAIN  UNUSUAL VAGINAL DISCHARGE OR ITCHING   Items with * indicate a potential emergency and should be followed up as soon as possible or go to the Emergency Department if any problems should occur.  Please show the CHEMOTHERAPY ALERT CARD or IMMUNOTHERAPY ALERT CARD at check-in  to the Emergency Department and triage nurse.  Should you have questions after your visit or need to cancel or reschedule your appointment, please contact [facility]  Dept: [phone]  and follow the prompts.  Office hours are 8:00 a.m. to 4:30 p.m. Monday - Friday. Please note that voicemails left after 4:00 p.m. may not be returned until the following business day.  We are closed weekends and major holidays. You have access to a nurse at all times for urgent questions. Please call the main number to the clinic Dept: [phone] and follow the prompts.   For any non-urgent questions, you may also contact your provider using MyChart. We now offer e-Visits for anyone 11 and older to request care online for non-urgent symptoms. For details visit [email or web address].   Also download the MyChart app! Go to the app store, search "MyChart", open the app, select [facility], and log in with your MyChart username and password.  Masks are optional in the cancer centers. If you would like for your care team to wear a mask while they are taking care of you, please let them know. You may have one support person who is at least 57 years old accompany you for your appointments.  Durvalumab Injection What is this medication? DURVALUMAB (dur VAL ue mab) treats some types of cancer. It works by helping your immune system slow or stop the spread of cancer cells. It is a monoclonal antibody. This medicine may be used for other purposes; ask your health care provider or pharmacist if you have questions. COMMON BRAND NAME(S): IMFINZI  What should I tell my care team before I take this medication? They need to know if you have any of these conditions: Allogeneic stem cell transplant (uses someone else's stem cells) Autoimmune diseases, such as Crohn disease, ulcerative colitis, lupus History of chest radiation Nervous system problems, such as Guillain-Barre syndrome, myasthenia  gravis Organ transplant An unusual or allergic reaction to durvalumab, other medications, foods, dyes, or preservatives Pregnant or trying to get pregnant Breast-feeding How should I use this medication? This medication is infused into a vein. It is given by your care team in a hospital or clinic setting. A special MedGuide will be given to you before each treatment. Be sure to read this information carefully each time. Talk to your care team about the use of this medication in children. Special care may be needed. Overdosage: If you think you have taken too much of this medicine contact a poison control center or emergency room at once. NOTE: This medicine is only for you. Do not share this medicine with others. What if I miss a dose? Keep appointments for follow-up doses. It is important not to miss your dose. Call your care team if you are unable to keep an appointment. What may interact with this medication? Interactions have not been studied. This list may not describe all possible interactions. Give your health care provider a list of all the medicines, herbs, non-prescription drugs, or dietary supplements you use. Also tell them if you smoke, drink alcohol, or use illegal drugs. Some items may interact with your medicine. What should I watch for while using this medication? Your condition will be monitored carefully while you are receiving this medication. You may need blood work while taking this medication. This medication may cause serious skin reactions. They can happen weeks to months after starting the medication. Contact your care team right away if you notice fevers or flu-like symptoms with a rash. The rash may be red or purple and then turn into blisters or peeling of the skin. You may also notice a red rash with swelling of the face, lips, or lymph nodes in your neck or under your arms. Tell your care team right away if you have any change in your eyesight. Talk to your care  team if you may be pregnant. Serious birth defects can occur if you take this medication during pregnancy and for 3 months after the last dose. You will need a negative pregnancy test before starting this medication. Contraception is recommended while taking this medication and for 3 months after the last dose. Your care team can help you find the option that works for you. Do not breastfeed while taking this medication and for 3 months after the last dose. What side effects may I notice from receiving this medication? Side effects that you should report to your care team as soon as possible: Allergic reactions--skin rash, itching, hives, swelling of the face, lips, tongue, or throat Dry cough, shortness of breath or trouble breathing Eye pain, redness, irritation, or discharge with blurry or decreased vision Heart muscle inflammation--unusual weakness or fatigue, shortness of breath, chest pain, fast or irregular heartbeat, dizziness, swelling of the ankles, feet, or hands Hormone gland problems--headache, sensitivity to light, unusual weakness or fatigue, dizziness, fast or irregular heartbeat, increased sensitivity to cold or heat, excessive sweating, constipation, hair loss, increased thirst or amount of urine, tremors or shaking, irritability Infusion reactions--chest pain, shortness of breath or trouble breathing, feeling faint or lightheaded Kidney injury (glomerulonephritis)--decrease in the  amount of urine, red or dark brown urine, foamy or bubbly urine, swelling of the ankles, hands, or feet Liver injury--right upper belly pain, loss of appetite, nausea, light-colored stool, dark yellow or brown urine, yellowing skin or eyes, unusual weakness or fatigue Pain, tingling, or numbness in the hands or feet, muscle weakness, change in vision, confusion or trouble speaking, loss of balance or coordination, trouble walking, seizures Rash, fever, and swollen lymph nodes Redness, blistering, peeling,  or loosening of the skin, including inside the mouth Sudden or severe stomach pain, bloody diarrhea, fever, nausea, vomiting Side effects that usually do not require medical attention (report these to your care team if they continue or are bothersome): Bone, joint, or muscle pain Diarrhea Fatigue Loss of appetite Nausea Skin rash This list may not describe all possible side effects. Call your doctor for medical advice about side effects. You may report side effects to FDA at 1-800-FDA-1088. Where should I keep my medication? This medication is given in a hospital or clinic. It will not be stored at home. NOTE: This sheet is a summary. It may not cover all possible information. If you have questions about this medicine, talk to your doctor, pharmacist, or health care provider.  2023 Elsevier/Gold Standard (2020-08-05 00:00:00)  Gemcitabine Injection What is this medication? GEMCITABINE (jem SYE ta been) treats some types of cancer. It works by slowing down the growth of cancer cells. This medicine may be used for other purposes; ask your health care provider or pharmacist if you have questions. COMMON BRAND NAME(S): Gemzar, Infugem What should I tell my care team before I take this medication? They need to know if you have any of these conditions: Blood disorders Infection Kidney disease Liver disease Lung or breathing disease, such as asthma or COPD Recent or ongoing radiation therapy An unusual or allergic reaction to gemcitabine, other medications, foods, dyes, or preservatives If you or your partner are pregnant or trying to get pregnant Breast-feeding How should I use this medication? This medication is injected into a vein. It is given by your care team in a hospital or clinic setting. Talk to your care team about the use of this medication in children. Special care may be needed. Overdosage: If you think you have taken too much of this medicine contact a poison control center  or emergency room at once. NOTE: This medicine is only for you. Do not share this medicine with others. What if I miss a dose? Keep appointments for follow-up doses. It is important not to miss your dose. Call your care team if you are unable to keep an appointment. What may interact with this medication? Interactions have not been studied. This list may not describe all possible interactions. Give your health care provider a list of all the medicines, herbs, non-prescription drugs, or dietary supplements you use. Also tell them if you smoke, drink alcohol, or use illegal drugs. Some items may interact with your medicine. What should I watch for while using this medication? Your condition will be monitored carefully while you are receiving this medication. This medication may make you feel generally unwell. This is not uncommon, as chemotherapy can affect healthy cells as well as cancer cells. Report any side effects. Continue your course of treatment even though you feel ill unless your care team tells you to stop. In some cases, you may be given additional medications to help with side effects. Follow all directions for their use. This medication may increase your risk of getting  an infection. Call your care team for advice if you get a fever, chills, sore throat, or other symptoms of a cold or flu. Do not treat yourself. Try to avoid being around people who are sick. This medication may increase your risk to bruise or bleed. Call your care team if you notice any unusual bleeding. Be careful brushing or flossing your teeth or using a toothpick because you may get an infection or bleed more easily. If you have any dental work done, tell your dentist you are receiving this medication. Avoid taking medications that contain aspirin, acetaminophen, ibuprofen, naproxen, or ketoprofen unless instructed by your care team. These medications may hide a fever. Talk to your care team if you or your partner wish  to become pregnant or think you might be pregnant. This medication can cause serious birth defects if taken during pregnancy and for 6 months after the last dose. A negative pregnancy test is required before starting this medication. A reliable form of contraception is recommended while taking this medication and for 6 months after the last dose. Talk to your care team about effective forms of contraception. Do not father a child while taking this medication and for 3 months after the last dose. Use a condom while having sex during this time period. Do not breastfeed while taking this medication and for at least 1 week after the last dose. This medication may cause infertility. Talk to your care team if you are concerned about your fertility. What side effects may I notice from receiving this medication? Side effects that you should report to your care team as soon as possible: Allergic reactions--skin rash, itching, hives, swelling of the face, lips, tongue, or throat Capillary leak syndrome--stomach or muscle pain, unusual weakness or fatigue, feeling faint or lightheaded, decrease in the amount of urine, swelling of the ankles, hands, or feet, trouble breathing Infection--fever, chills, cough, sore throat, wounds that don't heal, pain or trouble when passing urine, general feeling of discomfort or being unwell Liver injury--right upper belly pain, loss of appetite, nausea, light-colored stool, dark yellow or brown urine, yellowing skin or eyes, unusual weakness or fatigue Low red blood cell level--unusual weakness or fatigue, dizziness, headache, trouble breathing Lung injury--shortness of breath or trouble breathing, cough, spitting up blood, chest pain, fever Stomach pain, bloody diarrhea, pale skin, unusual weakness or fatigue, decrease in the amount of urine, which may be signs of hemolytic uremic syndrome Sudden and severe headache, confusion, change in vision, seizures, which may be signs of  posterior reversible encephalopathy syndrome (PRES) Unusual bruising or bleeding Side effects that usually do not require medical attention (report to your care team if they continue or are bothersome): Diarrhea Drowsiness Hair loss Nausea Pain, redness, or swelling with sores inside the mouth or throat Vomiting This list may not describe all possible side effects. Call your doctor for medical advice about side effects. You may report side effects to FDA at 1-800-FDA-1088. Where should I keep my medication? This medication is given in a hospital or clinic. It will not be stored at home. NOTE: This sheet is a summary. It may not cover all possible information. If you have questions about this medicine, talk to your doctor, pharmacist, or health care provider.  2023 Elsevier/Gold Standard (2021-06-16 00:00:00)

## 2021-11-18 ENCOUNTER — Inpatient Hospital Stay: Payer: Managed Care, Other (non HMO) | Admitting: Oncology

## 2021-11-18 ENCOUNTER — Encounter: Payer: Self-pay | Admitting: Oncology

## 2021-11-18 ENCOUNTER — Inpatient Hospital Stay: Payer: Managed Care, Other (non HMO)

## 2021-11-18 VITALS — BP 134/76 | HR 70 | Temp 98.3°F | Resp 18

## 2021-11-18 VITALS — BP 133/81 | HR 71 | Temp 98.3°F | Resp 18 | Ht 74.0 in | Wt 200.8 lb

## 2021-11-18 DIAGNOSIS — C221 Intrahepatic bile duct carcinoma: Secondary | ICD-10-CM | POA: Diagnosis not present

## 2021-11-18 DIAGNOSIS — Z5112 Encounter for antineoplastic immunotherapy: Secondary | ICD-10-CM | POA: Diagnosis not present

## 2021-11-18 LAB — CMP (CANCER CENTER ONLY)
ALT: 35 U/L (ref 0–44)
AST: 33 U/L (ref 15–41)
Albumin: 3.9 g/dL (ref 3.5–5.0)
Alkaline Phosphatase: 181 U/L — ABNORMAL HIGH (ref 38–126)
Anion gap: 9 (ref 5–15)
BUN: 14 mg/dL (ref 6–20)
CO2: 25 mmol/L (ref 22–32)
Calcium: 8.9 mg/dL (ref 8.9–10.3)
Chloride: 104 mmol/L (ref 98–111)
Creatinine: 0.85 mg/dL (ref 0.61–1.24)
GFR, Estimated: >60 mL/min (ref >60)
Glucose, Bld: 144 mg/dL — ABNORMAL HIGH (ref 70–99)
Potassium: 3.7 mmol/L (ref 3.5–5.1)
Sodium: 138 mmol/L (ref 135–145)
Total Bilirubin: 0.5 mg/dL (ref 0.3–1.2)
Total Protein: 6.3 g/dL — ABNORMAL LOW (ref 6.5–8.1)

## 2021-11-18 LAB — CBC WITH DIFFERENTIAL (CANCER CENTER ONLY)
Abs Immature Granulocytes: 0.03 10*3/uL (ref 0.00–0.07)
Basophils Absolute: 0.1 10*3/uL (ref 0.0–0.1)
Basophils Relative: 1 %
Eosinophils Absolute: 0.1 10*3/uL (ref 0.0–0.5)
Eosinophils Relative: 2 %
HCT: 27.7 % — ABNORMAL LOW (ref 39.0–52.0)
Hemoglobin: 9.3 g/dL — ABNORMAL LOW (ref 13.0–17.0)
Immature Granulocytes: 1 %
Lymphocytes Relative: 24 %
Lymphs Abs: 1.5 10*3/uL (ref 0.7–4.0)
MCH: 29.5 pg (ref 26.0–34.0)
MCHC: 33.6 g/dL (ref 30.0–36.0)
MCV: 87.9 fL (ref 80.0–100.0)
Monocytes Absolute: 0.3 10*3/uL (ref 0.1–1.0)
Monocytes Relative: 5 %
Neutro Abs: 4.2 10*3/uL (ref 1.7–7.7)
Neutrophils Relative %: 67 %
Platelet Count: 272 10*3/uL (ref 150–400)
RBC: 3.15 MIL/uL — ABNORMAL LOW (ref 4.22–5.81)
RDW: 16.6 % — ABNORMAL HIGH (ref 11.5–15.5)
WBC Count: 6.2 10*3/uL (ref 4.0–10.5)
nRBC: 0 % (ref 0.0–0.2)

## 2021-11-18 LAB — MAGNESIUM: Magnesium: 1.6 mg/dL — ABNORMAL LOW (ref 1.7–2.4)

## 2021-11-18 MED ORDER — SODIUM CHLORIDE 0.9 % IV SOLN
1000.0000 mg/m2 | Freq: Once | INTRAVENOUS | Status: AC
  Administered 2021-11-18: 2090 mg via INTRAVENOUS
  Filled 2021-11-18: qty 5.26

## 2021-11-18 MED ORDER — SODIUM CHLORIDE 0.9 % IV SOLN
24.0000 mg/m2 | Freq: Once | INTRAVENOUS | Status: AC
  Administered 2021-11-18: 50 mg via INTRAVENOUS
  Filled 2021-11-18: qty 50

## 2021-11-18 MED ORDER — HEPARIN SOD (PORK) LOCK FLUSH 100 UNIT/ML IV SOLN
500.0000 [IU] | Freq: Once | INTRAVENOUS | Status: AC | PRN
  Administered 2021-11-18: 500 [IU]

## 2021-11-18 MED ORDER — PALONOSETRON HCL INJECTION 0.25 MG/5ML
0.2500 mg | Freq: Once | INTRAVENOUS | Status: AC
  Administered 2021-11-18: 0.25 mg via INTRAVENOUS
  Filled 2021-11-18: qty 5

## 2021-11-18 MED ORDER — POTASSIUM CHLORIDE IN NACL 20-0.9 MEQ/L-% IV SOLN
Freq: Once | INTRAVENOUS | Status: AC
  Filled 2021-11-18: qty 1000

## 2021-11-18 MED ORDER — SODIUM CHLORIDE 0.9% FLUSH
10.0000 mL | INTRAVENOUS | Status: DC | PRN
  Administered 2021-11-18: 10 mL

## 2021-11-18 MED ORDER — MAGNESIUM SULFATE 2 GM/50ML IV SOLN
2.0000 g | Freq: Once | INTRAVENOUS | Status: AC
  Administered 2021-11-18: 2 g via INTRAVENOUS
  Filled 2021-11-18: qty 50

## 2021-11-18 MED ORDER — SODIUM CHLORIDE 0.9 % IV SOLN
150.0000 mg | Freq: Once | INTRAVENOUS | Status: AC
  Administered 2021-11-18: 150 mg via INTRAVENOUS
  Filled 2021-11-18: qty 150

## 2021-11-18 MED ORDER — SODIUM CHLORIDE 0.9 % IV SOLN
10.0000 mg | Freq: Once | INTRAVENOUS | Status: AC
  Administered 2021-11-18: 10 mg via INTRAVENOUS
  Filled 2021-11-18: qty 10

## 2021-11-18 MED ORDER — SODIUM CHLORIDE 0.9 % IV SOLN: Freq: Once | INTRAVENOUS | Status: AC

## 2021-11-18 NOTE — Progress Notes (Deleted)
Liver mass concerning for malignancy -CT abdomen/pelvis with contrast 08/14/1998 23-2.9 x 2.7 x 2.7 cm hypoenhancing mass in segment 5 near the liver hilum just above hepatic ductal confluence suspicious for primary bile duct malignancy or primary hepatic malignancy -MRCP 08/14/2021-irregular mass of segment 8 of the liver with perilesional enhancement concerning for cholangiocarcinoma -08/14/2021 CA 19.9 was elevated at 76 -08/16/2021 AFP 6.4 -08/16/2021 cytology from bile duct brushing showed atypical cells -08/19/2021 cytology from bile duct brushing showed cells suspicious for malignancy -08/25/2021 CT-guided liver biopsy-moderately differentiated adenocarcinoma, CK7 positive, CDX2 positive in a small population of cells, TTF-1 negative, CK20 negative-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 -[facility] at [facility] 09/06/2021-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  Obstructive jaundice -08/16/2021 percutaneous biliary drain placement 3.  Hepatic steatosis 4.  GERD 5.  Hypertension 6.  Normocytic anemia 7.  Enlarged prostate seen on CT 8.  Tobacco use 9.  Enterococcus and Aerococcus bacteremia 08/20/2021-discharged 08/26/2021 to complete outpatient course of Augmentin 10.  Peripheral neuropathy 11.  Port-A-Cath placement 09/23/2021 12.  Biliary drain exchange 09/23/2021; drain  capped 10/12/2021 13.  Rigors following biliary drain procedure 09/23/2021, biliary drain culture-Enterobacter Cloacae, Bactrim x7 days 09/27/2021 14.  Right leg edema and pain 10/18/2021-Doppler negative for DVT

## 2021-11-18 NOTE — Patient Instructions (Signed)

## 2021-11-18 NOTE — Patient Instructions (Signed)
[facility]   Discharge Instructions: Thank you for choosing [facility] to provide your oncology and hematology care.   If you have a lab appointment with the Cancer Center, please go directly to the Cancer Center and check in at the registration area.   Wear comfortable clothing and clothing appropriate for easy access to any Portacath or PICC line.   We strive to give you quality time with your provider. You may need to reschedule your appointment if you arrive late (15 or more minutes).  Arriving late affects you and other patients whose appointments are after yours.  Also, if you miss three or more appointments without notifying the office, you may be dismissed from the clinic at the provider's discretion.      For prescription refill requests, have your pharmacy contact our office and allow 72 hours for refills to be completed.    Today you received the following chemotherapy and/or immunotherapy agents Gemzar, Cisplatin.      To help prevent nausea and vomiting after your treatment, we encourage you to take your nausea medication as directed.  BELOW ARE SYMPTOMS THAT SHOULD BE REPORTED IMMEDIATELY: *FEVER GREATER THAN 100.4 F (38 C) OR HIGHER *CHILLS OR SWEATING *NAUSEA AND VOMITING THAT IS NOT CONTROLLED WITH YOUR NAUSEA MEDICATION *UNUSUAL SHORTNESS OF BREATH *UNUSUAL BRUISING OR BLEEDING *URINARY PROBLEMS (pain or burning when urinating, or frequent urination) *BOWEL PROBLEMS (unusual diarrhea, constipation, pain near the anus) TENDERNESS IN MOUTH AND THROAT WITH OR WITHOUT PRESENCE OF ULCERS (sore throat, sores in mouth, or a toothache) UNUSUAL RASH, SWELLING OR PAIN  UNUSUAL VAGINAL DISCHARGE OR ITCHING   Items with * indicate a potential emergency and should be followed up as soon as possible or go to the Emergency Department if any problems should occur.  Please show the CHEMOTHERAPY ALERT CARD or IMMUNOTHERAPY ALERT CARD at  check-in to the Emergency Department and triage nurse.  Should you have questions after your visit or need to cancel or reschedule your appointment, please contact [facility]  Dept: [phone]  and follow the prompts.  Office hours are 8:00 a.m. to 4:30 p.m. Monday - Friday. Please note that voicemails left after 4:00 p.m. may not be returned until the following business day.  We are closed weekends and major holidays. You have access to a nurse at all times for urgent questions. Please call the main number to the clinic Dept: [phone] and follow the prompts.   For any non-urgent questions, you may also contact your provider using MyChart. We now offer e-Visits for anyone 18 and older to request care online for non-urgent symptoms. For details visit [email or web address].   Also download the MyChart app! Go to the app store, search "MyChart", open the app, select , and log in with your MyChart username and password.  Masks are optional in the cancer centers. If you would like for your care team to wear a mask while they are taking care of you, please let them know. You may have one support person who is at least 57 years old accompany you for your appointments.  Gemcitabine Injection What is this medication? GEMCITABINE (jem SYE ta been) treats some types of cancer. It works by slowing down the growth of cancer cells. This medicine may be used for other purposes; ask your health care provider or pharmacist if you have questions. COMMON BRAND NAME(S): Gemzar, Infugem What should I tell my care team before I   take this medication? They need to know if you have any of these conditions: Blood disorders Infection Kidney disease Liver disease Lung or breathing disease, such as asthma or COPD Recent or ongoing radiation therapy An unusual or allergic reaction to gemcitabine, other medications, foods, dyes, or preservatives If you or your partner are  pregnant or trying to get pregnant Breast-feeding How should I use this medication? This medication is injected into a vein. It is given by your care team in a hospital or clinic setting. Talk to your care team about the use of this medication in children. Special care may be needed. Overdosage: If you think you have taken too much of this medicine contact a poison control center or emergency room at once. NOTE: This medicine is only for you. Do not share this medicine with others. What if I miss a dose? Keep appointments for follow-up doses. It is important not to miss your dose. Call your care team if you are unable to keep an appointment. What may interact with this medication? Interactions have not been studied. This list may not describe all possible interactions. Give your health care provider a list of all the medicines, herbs, non-prescription drugs, or dietary supplements you use. Also tell them if you smoke, drink alcohol, or use illegal drugs. Some items may interact with your medicine. What should I watch for while using this medication? Your condition will be monitored carefully while you are receiving this medication. This medication may make you feel generally unwell. This is not uncommon, as chemotherapy can affect healthy cells as well as cancer cells. Report any side effects. Continue your course of treatment even though you feel ill unless your care team tells you to stop. In some cases, you may be given additional medications to help with side effects. Follow all directions for their use. This medication may increase your risk of getting an infection. Call your care team for advice if you get a fever, chills, sore throat, or other symptoms of a cold or flu. Do not treat yourself. Try to avoid being around people who are sick. This medication may increase your risk to bruise or bleed. Call your care team if you notice any unusual bleeding. Be careful brushing or flossing your  teeth or using a toothpick because you may get an infection or bleed more easily. If you have any dental work done, tell your dentist you are receiving this medication. Avoid taking medications that contain aspirin, acetaminophen, ibuprofen, naproxen, or ketoprofen unless instructed by your care team. These medications may hide a fever. Talk to your care team if you or your partner wish to become pregnant or think you might be pregnant. This medication can cause serious birth defects if taken during pregnancy and for 6 months after the last dose. A negative pregnancy test is required before starting this medication. A reliable form of contraception is recommended while taking this medication and for 6 months after the last dose. Talk to your care team about effective forms of contraception. Do not father a child while taking this medication and for 3 months after the last dose. Use a condom while having sex during this time period. Do not breastfeed while taking this medication and for at least 1 week after the last dose. This medication may cause infertility. Talk to your care team if you are concerned about your fertility. What side effects may I notice from receiving this medication? Side effects that you should report to your care team   as soon as possible: Allergic reactions--skin rash, itching, hives, swelling of the face, lips, tongue, or throat Capillary leak syndrome--stomach or muscle pain, unusual weakness or fatigue, feeling faint or lightheaded, decrease in the amount of urine, swelling of the ankles, hands, or feet, trouble breathing Infection--fever, chills, cough, sore throat, wounds that don't heal, pain or trouble when passing urine, general feeling of discomfort or being unwell Liver injury--right upper belly pain, loss of appetite, nausea, light-colored stool, dark yellow or brown urine, yellowing skin or eyes, unusual weakness or fatigue Low red blood cell level--unusual weakness or  fatigue, dizziness, headache, trouble breathing Lung injury--shortness of breath or trouble breathing, cough, spitting up blood, chest pain, fever Stomach pain, bloody diarrhea, pale skin, unusual weakness or fatigue, decrease in the amount of urine, which may be signs of hemolytic uremic syndrome Sudden and severe headache, confusion, change in vision, seizures, which may be signs of posterior reversible encephalopathy syndrome (PRES) Unusual bruising or bleeding Side effects that usually do not require medical attention (report to your care team if they continue or are bothersome): Diarrhea Drowsiness Hair loss Nausea Pain, redness, or swelling with sores inside the mouth or throat Vomiting This list may not describe all possible side effects. Call your doctor for medical advice about side effects. You may report side effects to FDA at 1-800-FDA-1088. Where should I keep my medication? This medication is given in a hospital or clinic. It will not be stored at home. NOTE: This sheet is a summary. It may not cover all possible information. If you have questions about this medicine, talk to your doctor, pharmacist, or health care provider.  2023 Elsevier/Gold Standard (2021-06-16 00:00:00)  Cisplatin Injection What is this medication? CISPLATIN (SIS pla tin) treats some types of cancer. It works by slowing down the growth of cancer cells. This medicine may be used for other purposes; ask your health care provider or pharmacist if you have questions. COMMON BRAND NAME(S): Platinol, Platinol -AQ What should I tell my care team before I take this medication? They need to know if you have any of these conditions: Eye disease, vision problems Hearing problems Kidney disease Low blood counts, such as low white cells, platelets, or red blood cells Tingling of the fingers or toes, or other nerve disorder An unusual or allergic reaction to cisplatin, carboplatin, oxaliplatin, other medications,  foods, dyes, or preservatives If you or your partner are pregnant or trying to get pregnant Breast-feeding How should I use this medication? This medication is injected into a vein. It is given by your care team in a hospital or clinic setting. Talk to your care team about the use of this medication in children. Special care may be needed. Overdosage: If you think you have taken too much of this medicine contact a poison control center or emergency room at once. NOTE: This medicine is only for you. Do not share this medicine with others. What if I miss a dose? Keep appointments for follow-up doses. It is important not to miss your dose. Call your care team if you are unable to keep an appointment. What may interact with this medication? Do not take this medication with any of the following: Live virus vaccines This medication may also interact with the following: Certain antibiotics, such as amikacin, gentamicin, neomycin, polymyxin B, streptomycin, tobramycin, vancomycin Foscarnet This list may not describe all possible interactions. Give your health care provider a list of all the medicines, herbs, non-prescription drugs, or dietary supplements you use. Also   tell them if you smoke, drink alcohol, or use illegal drugs. Some items may interact with your medicine. What should I watch for while using this medication? Your condition will be monitored carefully while you are receiving this medication. You may need blood work done while taking this medication. This medication may make you feel generally unwell. This is not uncommon, as chemotherapy can affect healthy cells as well as cancer cells. Report any side effects. Continue your course of treatment even though you feel ill unless your care team tells you to stop. This medication may increase your risk of getting an infection. Call your care team for advice if you get a fever, chills, sore throat, or other symptoms of a cold or flu. Do not treat  yourself. Try to avoid being around people who are sick. Avoid taking medications that contain aspirin, acetaminophen, ibuprofen, naproxen, or ketoprofen unless instructed by your care team. These medications may hide a fever. This medication may increase your risk to bruise or bleed. Call your care team if you notice any unusual bleeding. Be careful brushing or flossing your teeth or using a toothpick because you may get an infection or bleed more easily. If you have any dental work done, tell your dentist you are receiving this medication. Drink fluids as directed while you are taking this medication. This will help protect your kidneys. Call your care team if you get diarrhea. Do not treat yourself. Talk to your care team if you or your partner wish to become pregnant or think you might be pregnant. This medication can cause serious birth defects if taken during pregnancy and for 14 months after the last dose. A negative pregnancy test is required before starting this medication. A reliable form of contraception is recommended while taking this medication and for 14 months after the last dose. Talk to your care team about effective forms of contraception. Do not father a child while taking this medication and for 11 months after the last dose. Use a condom during sex during this time period. Do not breast-feed while taking this medication. This medication may cause infertility. Talk to your care team if you are concerned about your fertility. What side effects may I notice from receiving this medication? Side effects that you should report to your care team as soon as possible: Allergic reactions--skin rash, itching, hives, swelling of the face, lips, tongue, or throat Eye pain, change in vision, vision loss Hearing loss, ringing in ears Infection--fever, chills, cough, sore throat, wounds that don't heal, pain or trouble when passing urine, general feeling of discomfort or being unwell Kidney  injury--decrease in the amount of urine, swelling of the ankles, hands, or feet Low red blood cell level--unusual weakness or fatigue, dizziness, headache, trouble breathing Painful swelling, warmth, or redness of the skin, blisters or sores at the infusion site Pain, tingling, or numbness in the hands or feet Unusual bruising or bleeding Side effects that usually do not require medical attention (report to your care team if they continue or are bothersome): Hair loss Nausea Vomiting This list may not describe all possible side effects. Call your doctor for medical advice about side effects. You may report side effects to FDA at 1-800-FDA-1088. Where should I keep my medication? This medication is given in a hospital or clinic. It will not be stored at home. NOTE: This sheet is a summary. It may not cover all possible information. If you have questions about this medicine, talk to your doctor, pharmacist, or   health care provider.  2023 Elsevier/Gold Standard (2021-06-14 00:00:00) 

## 2021-11-18 NOTE — Progress Notes (Signed)
[facility] OFFICE PROGRESS NOTE   Diagnosis: Cholangiocarcinoma  INTERVAL HISTORY:   Jeffrey Blevins completed day 1 cycle 3 chemotherapy on 11/12/2021.  Cisplatin was held secondary to increased neuropathy symptoms in the feet.  He reports baseline numbness at the distal feet.  This progressed over the past few weeks.  Cisplatin was held last week and the neuropathy symptoms have returned to baseline.  Right leg edema has improved. No fever, nausea, or rash.  A biliary drain remains in place.  Objective:  Vital signs in last 24 hours:  There were no vitals taken for this visit.    HEENT: No thrush or ulcers Resp: Lungs clear bilaterally Cardio: Regular rate and rhythm GI: No hepatosplenomegaly, no mass, nontender, right upper abdomen biliary drain site without evidence of infection Vascular: The right lower leg is slightly larger than left side, no edema or erythema   Portacath/PICC-without erythema  Lab Results:  Lab Results  Component Value Date   WBC 6.2 11/18/2021   HGB 9.3 (L) 11/18/2021   HCT 27.7 (L) 11/18/2021   MCV 87.9 11/18/2021   PLT 272 11/18/2021   NEUTROABS 4.2 11/18/2021    CMP  Lab Results  Component Value Date   NA 134 (L) 11/11/2021   K 4.1 11/11/2021   CL 99 11/11/2021   CO2 27 11/11/2021   GLUCOSE 114 (H) 11/11/2021   BUN 10 11/11/2021   CREATININE 0.83 11/11/2021   CALCIUM 9.1 11/11/2021   PROT 6.7 11/11/2021   ALBUMIN 4.0 11/11/2021   AST 19 11/11/2021   ALT 18 11/11/2021   ALKPHOS 234 (H) 11/11/2021   BILITOT 0.4 11/11/2021   GFRNONAA >60 11/11/2021    Lab Results  Component Value Date   LTJ030 159 (H) 09/30/2021      Medications: I have reviewed the patient's current medications.   Assessment/Plan: Liver mass concerning for malignancy -CT abdomen/pelvis with contrast 08/14/1998 23-2.9 x 2.7 x 2.7 cm hypoenhancing mass in segment 5 near the liver hilum just above hepatic ductal confluence suspicious for primary  bile duct malignancy or primary hepatic malignancy -MRCP 08/14/2021-irregular mass of segment 8 of the liver with perilesional enhancement concerning for cholangiocarcinoma -08/14/2021 CA 19.9 was elevated at 76 -08/16/2021 AFP 6.4 -08/16/2021 cytology from bile duct brushing showed atypical cells -08/19/2021 cytology from bile duct brushing showed cells suspicious for malignancy -08/25/2021 CT-guided liver biopsy-moderately differentiated adenocarcinoma, CK7 positive, CDX2 positive in a small population of cells, TTF-1 negative, CK20 negative-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 -[facility] at [facility] 09/06/2021-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, occlusion of the anterior branch of the right portal vein, periportal enhancement and thickening on delayed imaging potentially due to tumor infiltration, no change in the 2 x 3.2 cm mass in segment 8, mild to moderate left and right intrahepatic biliary dilatation, biliary stent in place, small portacaval and periportal nodes, no evidence of metastatic disease in the chest -Cycle 1 gemcitabine/cisplatin/durvalumab 10/01/2021 -Cycle 2 gemcitabine/cisplatin/durvalumab 10/22/2021 -Cycle 3 gemcitabine/cisplatin/durvalumab 11/11/2021, D1 cisplatin held due to neuropathy, cisplatin resumed day 8 2.  Obstructive jaundice -08/16/2021 percutaneous biliary drain placement 3.  Hepatic steatosis 4.  GERD 5.  Hypertension 6.  Normocytic anemia 7.  Enlarged prostate seen on CT 8.  Tobacco use 9.  Enterococcus and Aerococcus bacteremia 08/20/2021-discharged 08/26/2021 to complete outpatient course of Augmentin 10.  Peripheral neuropathy 11.  Port-A-Cath placement 09/23/2021 12.  Biliary drain exchange 09/23/2021; drain capped 10/12/2021 13.  Rigors following biliary drain procedure  09/23/2021, biliary drain culture-Enterobacter Cloacae, Bactrim x7 days 09/27/2021 14.  Right leg edema and pain 10/18/2021-Doppler  negative for DVT      Disposition: Jeffrey Blevins appears stable.  He will complete day 8 cycle 3 gemcitabine/cisplatin/durvalumab today.  He is tolerating the treatment well.  Foot neuropathy symptoms have improved.  Cisplatin will be resumed with treatment today.  He will return for an office visit and day 1 cycle 4 chemotherapy in 2 weeks.  He will undergo a restaging evaluation after cycle 4.  We will coordinate the restaging plan with Dr. Fayrene Helper.  Betsy Coder, MD  11/18/2021  8:34 AM

## 2021-11-19 ENCOUNTER — Inpatient Hospital Stay: Payer: Managed Care, Other (non HMO)

## 2021-11-25 ENCOUNTER — Other Ambulatory Visit: Payer: Self-pay

## 2021-11-26 ENCOUNTER — Other Ambulatory Visit: Payer: Self-pay

## 2021-11-28 ENCOUNTER — Other Ambulatory Visit: Payer: Self-pay | Admitting: Oncology

## 2021-11-28 DIAGNOSIS — C221 Intrahepatic bile duct carcinoma: Secondary | ICD-10-CM

## 2021-12-02 ENCOUNTER — Inpatient Hospital Stay: Payer: Managed Care, Other (non HMO) | Attending: Oncology

## 2021-12-02 ENCOUNTER — Inpatient Hospital Stay: Payer: Managed Care, Other (non HMO)

## 2021-12-02 ENCOUNTER — Other Ambulatory Visit: Payer: Self-pay | Admitting: Oncology

## 2021-12-02 ENCOUNTER — Inpatient Hospital Stay (HOSPITAL_BASED_OUTPATIENT_CLINIC_OR_DEPARTMENT_OTHER): Payer: Managed Care, Other (non HMO) | Admitting: Oncology

## 2021-12-02 VITALS — BP 126/79 | HR 73 | Temp 98.1°F | Resp 18 | Ht 74.0 in | Wt 193.6 lb

## 2021-12-02 DIAGNOSIS — Z5111 Encounter for antineoplastic chemotherapy: Secondary | ICD-10-CM | POA: Diagnosis present

## 2021-12-02 DIAGNOSIS — C221 Intrahepatic bile duct carcinoma: Secondary | ICD-10-CM

## 2021-12-02 DIAGNOSIS — Z79899 Other long term (current) drug therapy: Secondary | ICD-10-CM | POA: Insufficient documentation

## 2021-12-02 DIAGNOSIS — R509 Fever, unspecified: Secondary | ICD-10-CM | POA: Diagnosis not present

## 2021-12-02 DIAGNOSIS — Z5112 Encounter for antineoplastic immunotherapy: Secondary | ICD-10-CM | POA: Insufficient documentation

## 2021-12-02 LAB — CBC WITH DIFFERENTIAL (CANCER CENTER ONLY)
Abs Immature Granulocytes: 0.04 10*3/uL (ref 0.00–0.07)
Basophils Absolute: 0 10*3/uL (ref 0.0–0.1)
Basophils Relative: 1 %
Eosinophils Absolute: 0.1 10*3/uL (ref 0.0–0.5)
Eosinophils Relative: 2 %
HCT: 29.2 % — ABNORMAL LOW (ref 39.0–52.0)
Hemoglobin: 9.8 g/dL — ABNORMAL LOW (ref 13.0–17.0)
Immature Granulocytes: 1 %
Lymphocytes Relative: 28 %
Lymphs Abs: 1.4 10*3/uL (ref 0.7–4.0)
MCH: 30.1 pg (ref 26.0–34.0)
MCHC: 33.6 g/dL (ref 30.0–36.0)
MCV: 89.6 fL (ref 80.0–100.0)
Monocytes Absolute: 0.8 10*3/uL (ref 0.1–1.0)
Monocytes Relative: 16 %
Neutro Abs: 2.6 10*3/uL (ref 1.7–7.7)
Neutrophils Relative %: 52 %
Platelet Count: 398 10*3/uL (ref 150–400)
RBC: 3.26 MIL/uL — ABNORMAL LOW (ref 4.22–5.81)
RDW: 18 % — ABNORMAL HIGH (ref 11.5–15.5)
WBC Count: 4.9 10*3/uL (ref 4.0–10.5)
nRBC: 0 % (ref 0.0–0.2)

## 2021-12-02 LAB — MAGNESIUM: Magnesium: 1.9 mg/dL (ref 1.7–2.4)

## 2021-12-02 LAB — CMP (CANCER CENTER ONLY)
ALT: 14 U/L (ref 0–44)
AST: 14 U/L — ABNORMAL LOW (ref 15–41)
Albumin: 4 g/dL (ref 3.5–5.0)
Alkaline Phosphatase: 164 U/L — ABNORMAL HIGH (ref 38–126)
Anion gap: 12 (ref 5–15)
BUN: 14 mg/dL (ref 6–20)
CO2: 25 mmol/L (ref 22–32)
Calcium: 9.4 mg/dL (ref 8.9–10.3)
Chloride: 100 mmol/L (ref 98–111)
Creatinine: 0.86 mg/dL (ref 0.61–1.24)
GFR, Estimated: >60 mL/min
Glucose, Bld: 114 mg/dL — ABNORMAL HIGH (ref 70–99)
Potassium: 3.7 mmol/L (ref 3.5–5.1)
Sodium: 137 mmol/L (ref 135–145)
Total Bilirubin: 0.6 mg/dL (ref 0.3–1.2)
Total Protein: 7.1 g/dL (ref 6.5–8.1)

## 2021-12-02 NOTE — Progress Notes (Signed)
Patient seen by Dr. Benay Spice today  Vitals are within treatment parameters.  Labs reviewed by Dr. Benay Spice and are within treatment parameters.  Per physician team, patient is ready for treatment and there are NO modifications to the treatment plan. treatment on 12/03/2021

## 2021-12-02 NOTE — Progress Notes (Signed)
[place] [facility] OFFICE PROGRESS NOTE   Diagnosis: Cholangiocarcinoma  INTERVAL HISTORY:   [name] completed another cycle of chemotherapy beginning 11/12/2021.  No change in foot neuropathy symptoms.  Right knee pain has improved.  No nausea or vomiting following chemotherapy.  No diarrhea.  He has developed a rash at the right lower leg and ankle.  [name] had a low-grade fever, sweats, and malaise beginning on 11/27/2021.  His symptoms lasted 4 days and have resolved.  He had mild discomfort at the right upper abdomen and nausea during this time.  Objective:  Vital signs in last 24 hours:  Blood pressure 126/79, pulse 73, temperature 98.1 F (36.7 C), temperature source Oral, resp. rate 18, height '6\' 2"'$  (1.88 m), weight 193 lb 9.6 oz (87.8 kg), SpO2 100 %.    HEENT: No thrush or ulcers Resp: Lungs clear bilaterally Cardio: Regular rate and rhythm GI: No hepatosplenomegaly, right upper quadrant biliary drain site without evidence of infection Vascular: The right lower leg is slightly larger than the left side  Skin: Demarcated rash at the right lower leg and ankle  Portacath/PICC-without erythema  Lab Results:  Lab Results  Component Value Date   WBC 4.9 12/02/2021   HGB 9.8 (L) 12/02/2021   HCT 29.2 (L) 12/02/2021   MCV 89.6 12/02/2021   PLT 398 12/02/2021   NEUTROABS 2.6 12/02/2021    CMP  Lab Results  Component Value Date   NA 137 12/02/2021   K 3.7 12/02/2021   CL 100 12/02/2021   CO2 25 12/02/2021   GLUCOSE 114 (H) 12/02/2021   BUN 14 12/02/2021   CREATININE 0.86 12/02/2021   CALCIUM 9.4 12/02/2021   PROT 7.1 12/02/2021   ALBUMIN 4.0 12/02/2021   AST 14 (L) 12/02/2021   ALT 14 12/02/2021   ALKPHOS 164 (H) 12/02/2021   BILITOT 0.6 12/02/2021   GFRNONAA >60 12/02/2021    Lab Results  Component Value Date   [id number] 159 (H) 09/30/2021     Medications: I have reviewed the patient's current medications.   Assessment/Plan: Liver mass  concerning for malignancy -CT abdomen/pelvis with contrast 08/14/1998 23-2.9 x 2.7 x 2.7 cm hypoenhancing mass in segment 5 near the liver hilum just above hepatic ductal confluence suspicious for primary bile duct malignancy or primary hepatic malignancy -MRCP 08/14/2021-irregular mass of segment 8 of the liver with perilesional enhancement concerning for cholangiocarcinoma -08/14/2021 CA 19.9 was elevated at 76 -08/16/2021 AFP 6.4 -08/16/2021 cytology from bile duct brushing showed atypical cells -08/19/2021 cytology from bile duct brushing showed cells suspicious for malignancy -08/25/2021 CT-guided liver biopsy-moderately differentiated adenocarcinoma, CK7 positive, CDX2 positive in a small population of cells, TTF-1 negative, CK20 negative-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 -CTs at [facility] 09/06/2021-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, occlusion of the anterior branch of the right portal vein, periportal enhancement and thickening on delayed imaging potentially due to tumor infiltration, no change in the 2 x 3.2 cm mass in segment 8, mild to moderate left and right intrahepatic biliary dilatation, biliary stent in place, small portacaval and periportal nodes, no evidence of metastatic disease in the chest -Cycle 1 gemcitabine/cisplatin/durvalumab 10/01/2021 -Cycle 2 gemcitabine/cisplatin/durvalumab 10/22/2021 -Cycle 3 gemcitabine/cisplatin/durvalumab 11/11/2021, D1 cisplatin held due to neuropathy, cisplatin resumed day 8 -Cycle 4 gemcitabine/cisplatin/durvalumab 12/03/2021 2.  Obstructive jaundice -08/16/2021 percutaneous biliary drain placement 3.  Hepatic steatosis 4.  GERD 5.  Hypertension 6.  Normocytic anemia 7.  Enlarged prostate seen on CT 8.  Tobacco use 9.  Enterococcus and Aerococcus bacteremia 08/20/2021-discharged 08/26/2021 to complete outpatient course of Augmentin 10.  Peripheral neuropathy 11.  Port-A-Cath  placement 09/23/2021 12.  Biliary drain exchange 09/23/2021; drain capped 10/12/2021 13.  Rigors following biliary drain procedure 09/23/2021, biliary drain culture-Enterobacter Cloacae, Bactrim x7 days 09/27/2021 14.  Right leg edema and pain 10/18/2021-Doppler negative for DVT       Disposition: [name] has completed 3 cycles of gemcitabine/cisplatin/durvalumab.  He has tolerated the treatment well.  He will complete cycle 4 beginning tomorrow.  He will undergo repeat imaging after this cycle.  I will contact Dr. Fayrene Helper to discuss imaging options.  [name] will call for recurrent fever.  He will return for an office visit as scheduled on 12/23/2021.  He will try an over-the-counter antifungal powder for the right lower leg/foot rash.  The rash has the appearance of a yeast rash.  Betsy Coder, MD  12/02/2021  9:34 AM

## 2021-12-03 ENCOUNTER — Other Ambulatory Visit: Payer: Self-pay | Admitting: *Deleted

## 2021-12-03 ENCOUNTER — Inpatient Hospital Stay: Payer: Managed Care, Other (non HMO)

## 2021-12-03 ENCOUNTER — Encounter: Payer: Self-pay | Admitting: *Deleted

## 2021-12-03 VITALS — BP 128/80 | HR 77 | Temp 98.1°F | Resp 18

## 2021-12-03 DIAGNOSIS — Z5112 Encounter for antineoplastic immunotherapy: Secondary | ICD-10-CM | POA: Diagnosis not present

## 2021-12-03 DIAGNOSIS — C221 Intrahepatic bile duct carcinoma: Secondary | ICD-10-CM

## 2021-12-03 MED ORDER — HEPARIN SOD (PORK) LOCK FLUSH 100 UNIT/ML IV SOLN
500.0000 [IU] | Freq: Once | INTRAVENOUS | Status: AC | PRN
  Administered 2021-12-03: 500 [IU]

## 2021-12-03 MED ORDER — SODIUM CHLORIDE 0.9 % IV SOLN
1000.0000 mg/m2 | Freq: Once | INTRAVENOUS | Status: AC
  Administered 2021-12-03: 2090 mg via INTRAVENOUS
  Filled 2021-12-03: qty 52.6

## 2021-12-03 MED ORDER — SODIUM CHLORIDE 0.9 % IV SOLN
10.0000 mg | Freq: Once | INTRAVENOUS | Status: AC
  Administered 2021-12-03: 10 mg via INTRAVENOUS
  Filled 2021-12-03: qty 1

## 2021-12-03 MED ORDER — SODIUM CHLORIDE 0.9 % IV SOLN
50.0000 mg | Freq: Once | INTRAVENOUS | Status: AC
  Administered 2021-12-03: 50 mg via INTRAVENOUS
  Filled 2021-12-03: qty 50

## 2021-12-03 MED ORDER — ONDANSETRON HCL 8 MG PO TABS: 8.0000 mg | ORAL_TABLET | Freq: Three times a day (TID) | ORAL | 1 refills | Status: DC | PRN

## 2021-12-03 MED ORDER — LORAZEPAM 0.5 MG PO TABS: ORAL_TABLET | ORAL | 0 refills | Status: DC

## 2021-12-03 MED ORDER — MAGNESIUM SULFATE 2 GM/50ML IV SOLN
2.0000 g | Freq: Once | INTRAVENOUS | Status: AC
  Administered 2021-12-03: 2 g via INTRAVENOUS
  Filled 2021-12-03: qty 50

## 2021-12-03 MED ORDER — SODIUM CHLORIDE 0.9% FLUSH
10.0000 mL | INTRAVENOUS | Status: DC | PRN
  Administered 2021-12-03: 10 mL

## 2021-12-03 MED ORDER — PALONOSETRON HCL INJECTION 0.25 MG/5ML
0.2500 mg | Freq: Once | INTRAVENOUS | Status: AC
  Administered 2021-12-03: 0.25 mg via INTRAVENOUS
  Filled 2021-12-03: qty 5

## 2021-12-03 MED ORDER — SODIUM CHLORIDE 0.9 % IV SOLN: Freq: Once | INTRAVENOUS | Status: AC

## 2021-12-03 MED ORDER — SODIUM CHLORIDE 0.9 % IV SOLN
150.0000 mg | Freq: Once | INTRAVENOUS | Status: AC
  Administered 2021-12-03: 150 mg via INTRAVENOUS
  Filled 2021-12-03: qty 5

## 2021-12-03 MED ORDER — POTASSIUM CHLORIDE IN NACL 20-0.9 MEQ/L-% IV SOLN
Freq: Once | INTRAVENOUS | Status: AC
  Filled 2021-12-03: qty 1000

## 2021-12-03 MED ORDER — TRAMADOL HCL 50 MG PO TABS: 25.0000 mg | ORAL_TABLET | Freq: Four times a day (QID) | ORAL | 0 refills | Status: DC | PRN

## 2021-12-03 MED ORDER — SODIUM CHLORIDE 0.9 % IV SOLN
1500.0000 mg | Freq: Once | INTRAVENOUS | Status: AC
  Administered 2021-12-03: 1500 mg via INTRAVENOUS
  Filled 2021-12-03: qty 30

## 2021-12-03 NOTE — Telephone Encounter (Signed)
Jeffrey Blevins is requesting refills on ondansetron, lorazepam and tramadol. All refilled per Dr. Benay Spice approval.

## 2021-12-03 NOTE — Progress Notes (Signed)
Dr. Fayrene Helper and Dr. Benay Spice discussed case and Dr. Fayrene Helper recommends a MRI of abdomen. Scheduled for 10/18 at 0830/0900 at [facility]. NPO 4 hours prior. Patient is aware.

## 2021-12-03 NOTE — Patient Instructions (Signed)
[facility]  Discharge Instructions: Thank you for choosing [facility] to provide your oncology and hematology care.   If you have a lab appointment with the Cancer Center, please go directly to the Cancer Center and check in at the registration area.   Wear comfortable clothing and clothing appropriate for easy access to any Portacath or PICC line.   We strive to give you quality time with your provider. You may need to reschedule your appointment if you arrive late (15 or more minutes).  Arriving late affects you and other patients whose appointments are after yours.  Also, if you miss three or more appointments without notifying the office, you may be dismissed from the clinic at the provider's discretion.      For prescription refill requests, have your pharmacy contact our office and allow 72 hours for refills to be completed.    Today you received the following chemotherapy and/or immunotherapy agents: durvalumab, gemcitabine, cisplatin.       To help prevent nausea and vomiting after your treatment, we encourage you to take your nausea medication as directed.  BELOW ARE SYMPTOMS THAT SHOULD BE REPORTED IMMEDIATELY: *FEVER GREATER THAN 100.4 F (38 C) OR HIGHER *CHILLS OR SWEATING *NAUSEA AND VOMITING THAT IS NOT CONTROLLED WITH YOUR NAUSEA MEDICATION *UNUSUAL SHORTNESS OF BREATH *UNUSUAL BRUISING OR BLEEDING *URINARY PROBLEMS (pain or burning when urinating, or frequent urination) *BOWEL PROBLEMS (unusual diarrhea, constipation, pain near the anus) TENDERNESS IN MOUTH AND THROAT WITH OR WITHOUT PRESENCE OF ULCERS (sore throat, sores in mouth, or a toothache) UNUSUAL RASH, SWELLING OR PAIN  UNUSUAL VAGINAL DISCHARGE OR ITCHING   Items with * indicate a potential emergency and should be followed up as soon as possible or go to the Emergency Department if any problems should occur.  Please show the CHEMOTHERAPY ALERT CARD or IMMUNOTHERAPY  ALERT CARD at check-in to the Emergency Department and triage nurse.  Should you have questions after your visit or need to cancel or reschedule your appointment, please contact [facility]  Dept: [phone]  and follow the prompts.  Office hours are 8:00 a.m. to 4:30 p.m. Monday - Friday. Please note that voicemails left after 4:00 p.m. may not be returned until the following business day.  We are closed weekends and major holidays. You have access to a nurse at all times for urgent questions. Please call the main number to the clinic Dept: [phone] and follow the prompts.   For any non-urgent questions, you may also contact your provider using MyChart. We now offer e-Visits for anyone 18 and older to request care online for non-urgent symptoms. For details visit [email or web address].   Also download the MyChart app! Go to the app store, search "MyChart", open the app, select [facility], and log in with your MyChart username and password.  Masks are optional in the cancer centers. If you would like for your care team to wear a mask while they are taking care of you, please let them know. You may have one support person who is at least 57 years old accompany you for your appointments.  Durvalumab Injection What is this medication? DURVALUMAB (dur VAL ue mab) treats some types of cancer. It works by helping your immune system slow or stop the spread of cancer cells. It is a monoclonal antibody. This medicine may be used for other purposes; ask your health care provider or pharmacist if you have questions. COMMON BRAND NAME(S):   IMFINZI What should I tell my care team before I take this medication? They need to know if you have any of these conditions: Allogeneic stem cell transplant (uses someone else's stem cells) Autoimmune diseases, such as Crohn disease, ulcerative colitis, lupus History of chest radiation Nervous system problems, such as Guillain-Barre  syndrome, myasthenia gravis Organ transplant An unusual or allergic reaction to durvalumab, other medications, foods, dyes, or preservatives Pregnant or trying to get pregnant Breast-feeding How should I use this medication? This medication is infused into a vein. It is given by your care team in a hospital or clinic setting. A special MedGuide will be given to you before each treatment. Be sure to read this information carefully each time. Talk to your care team about the use of this medication in children. Special care may be needed. Overdosage: If you think you have taken too much of this medicine contact a poison control center or emergency room at once. NOTE: This medicine is only for you. Do not share this medicine with others. What if I miss a dose? Keep appointments for follow-up doses. It is important not to miss your dose. Call your care team if you are unable to keep an appointment. What may interact with this medication? Interactions have not been studied. This list may not describe all possible interactions. Give your health care provider a list of all the medicines, herbs, non-prescription drugs, or dietary supplements you use. Also tell them if you smoke, drink alcohol, or use illegal drugs. Some items may interact with your medicine. What should I watch for while using this medication? Your condition will be monitored carefully while you are receiving this medication. You may need blood work while taking this medication. This medication may cause serious skin reactions. They can happen weeks to months after starting the medication. Contact your care team right away if you notice fevers or flu-like symptoms with a rash. The rash may be red or purple and then turn into blisters or peeling of the skin. You may also notice a red rash with swelling of the face, lips, or lymph nodes in your neck or under your arms. Tell your care team right away if you have any change in your  eyesight. Talk to your care team if you may be pregnant. Serious birth defects can occur if you take this medication during pregnancy and for 3 months after the last dose. You will need a negative pregnancy test before starting this medication. Contraception is recommended while taking this medication and for 3 months after the last dose. Your care team can help you find the option that works for you. Do not breastfeed while taking this medication and for 3 months after the last dose. What side effects may I notice from receiving this medication? Side effects that you should report to your care team as soon as possible: Allergic reactions--skin rash, itching, hives, swelling of the face, lips, tongue, or throat Dry cough, shortness of breath or trouble breathing Eye pain, redness, irritation, or discharge with blurry or decreased vision Heart muscle inflammation--unusual weakness or fatigue, shortness of breath, chest pain, fast or irregular heartbeat, dizziness, swelling of the ankles, feet, or hands Hormone gland problems--headache, sensitivity to light, unusual weakness or fatigue, dizziness, fast or irregular heartbeat, increased sensitivity to cold or heat, excessive sweating, constipation, hair loss, increased thirst or amount of urine, tremors or shaking, irritability Infusion reactions--chest pain, shortness of breath or trouble breathing, feeling faint or lightheaded Kidney injury (glomerulonephritis)--decrease in  the amount of urine, red or dark brown urine, foamy or bubbly urine, swelling of the ankles, hands, or feet Liver injury--right upper belly pain, loss of appetite, nausea, light-colored stool, dark yellow or brown urine, yellowing skin or eyes, unusual weakness or fatigue Pain, tingling, or numbness in the hands or feet, muscle weakness, change in vision, confusion or trouble speaking, loss of balance or coordination, trouble walking, seizures Rash, fever, and swollen lymph  nodes Redness, blistering, peeling, or loosening of the skin, including inside the mouth Sudden or severe stomach pain, bloody diarrhea, fever, nausea, vomiting Side effects that usually do not require medical attention (report these to your care team if they continue or are bothersome): Bone, joint, or muscle pain Diarrhea Fatigue Loss of appetite Nausea Skin rash This list may not describe all possible side effects. Call your doctor for medical advice about side effects. You may report side effects to FDA at 1-800-FDA-1088. Where should I keep my medication? This medication is given in a hospital or clinic. It will not be stored at home. NOTE: This sheet is a summary. It may not cover all possible information. If you have questions about this medicine, talk to your doctor, pharmacist, or health care provider.  2023 Elsevier/Gold Standard (2020-08-05 00:00:00)   Gemcitabine Injection What is this medication? GEMCITABINE (jem SYE ta been) treats some types of cancer. It works by slowing down the growth of cancer cells. This medicine may be used for other purposes; ask your health care provider or pharmacist if you have questions. COMMON BRAND NAME(S): Gemzar, Infugem What should I tell my care team before I take this medication? They need to know if you have any of these conditions: Blood disorders Infection Kidney disease Liver disease Lung or breathing disease, such as asthma or COPD Recent or ongoing radiation therapy An unusual or allergic reaction to gemcitabine, other medications, foods, dyes, or preservatives If you or your partner are pregnant or trying to get pregnant Breast-feeding How should I use this medication? This medication is injected into a vein. It is given by your care team in a hospital or clinic setting. Talk to your care team about the use of this medication in children. Special care may be needed. Overdosage: If you think you have taken too much of this  medicine contact a poison control center or emergency room at once. NOTE: This medicine is only for you. Do not share this medicine with others. What if I miss a dose? Keep appointments for follow-up doses. It is important not to miss your dose. Call your care team if you are unable to keep an appointment. What may interact with this medication? Interactions have not been studied. This list may not describe all possible interactions. Give your health care provider a list of all the medicines, herbs, non-prescription drugs, or dietary supplements you use. Also tell them if you smoke, drink alcohol, or use illegal drugs. Some items may interact with your medicine. What should I watch for while using this medication? Your condition will be monitored carefully while you are receiving this medication. This medication may make you feel generally unwell. This is not uncommon, as chemotherapy can affect healthy cells as well as cancer cells. Report any side effects. Continue your course of treatment even though you feel ill unless your care team tells you to stop. In some cases, you may be given additional medications to help with side effects. Follow all directions for their use. This medication may increase your risk   of getting an infection. Call your care team for advice if you get a fever, chills, sore throat, or other symptoms of a cold or flu. Do not treat yourself. Try to avoid being around people who are sick. This medication may increase your risk to bruise or bleed. Call your care team if you notice any unusual bleeding. Be careful brushing or flossing your teeth or using a toothpick because you may get an infection or bleed more easily. If you have any dental work done, tell your dentist you are receiving this medication. Avoid taking medications that contain aspirin, acetaminophen, ibuprofen, naproxen, or ketoprofen unless instructed by your care team. These medications may hide a fever. Talk to  your care team if you or your partner wish to become pregnant or think you might be pregnant. This medication can cause serious birth defects if taken during pregnancy and for 6 months after the last dose. A negative pregnancy test is required before starting this medication. A reliable form of contraception is recommended while taking this medication and for 6 months after the last dose. Talk to your care team about effective forms of contraception. Do not father a child while taking this medication and for 3 months after the last dose. Use a condom while having sex during this time period. Do not breastfeed while taking this medication and for at least 1 week after the last dose. This medication may cause infertility. Talk to your care team if you are concerned about your fertility. What side effects may I notice from receiving this medication? Side effects that you should report to your care team as soon as possible: Allergic reactions--skin rash, itching, hives, swelling of the face, lips, tongue, or throat Capillary leak syndrome--stomach or muscle pain, unusual weakness or fatigue, feeling faint or lightheaded, decrease in the amount of urine, swelling of the ankles, hands, or feet, trouble breathing Infection--fever, chills, cough, sore throat, wounds that don't heal, pain or trouble when passing urine, general feeling of discomfort or being unwell Liver injury--right upper belly pain, loss of appetite, nausea, light-colored stool, dark yellow or brown urine, yellowing skin or eyes, unusual weakness or fatigue Low red blood cell level--unusual weakness or fatigue, dizziness, headache, trouble breathing Lung injury--shortness of breath or trouble breathing, cough, spitting up blood, chest pain, fever Stomach pain, bloody diarrhea, pale skin, unusual weakness or fatigue, decrease in the amount of urine, which may be signs of hemolytic uremic syndrome Sudden and severe headache, confusion, change in  vision, seizures, which may be signs of posterior reversible encephalopathy syndrome (PRES) Unusual bruising or bleeding Side effects that usually do not require medical attention (report to your care team if they continue or are bothersome): Diarrhea Drowsiness Hair loss Nausea Pain, redness, or swelling with sores inside the mouth or throat Vomiting This list may not describe all possible side effects. Call your doctor for medical advice about side effects. You may report side effects to FDA at 1-800-FDA-1088. Where should I keep my medication? This medication is given in a hospital or clinic. It will not be stored at home. NOTE: This sheet is a summary. It may not cover all possible information. If you have questions about this medicine, talk to your doctor, pharmacist, or health care provider.  2023 Elsevier/Gold Standard (2021-06-16 00:00:00)   Cisplatin Injection What is this medication? CISPLATIN (SIS pla tin) treats some types of cancer. It works by slowing down the growth of cancer cells. This medicine may be used for other purposes;   ask your health care provider or pharmacist if you have questions. COMMON BRAND NAME(S): Platinol, Platinol -AQ What should I tell my care team before I take this medication? They need to know if you have any of these conditions: Eye disease, vision problems Hearing problems Kidney disease Low blood counts, such as low white cells, platelets, or red blood cells Tingling of the fingers or toes, or other nerve disorder An unusual or allergic reaction to cisplatin, carboplatin, oxaliplatin, other medications, foods, dyes, or preservatives If you or your partner are pregnant or trying to get pregnant Breast-feeding How should I use this medication? This medication is injected into a vein. It is given by your care team in a hospital or clinic setting. Talk to your care team about the use of this medication in children. Special care may be  needed. Overdosage: If you think you have taken too much of this medicine contact a poison control center or emergency room at once. NOTE: This medicine is only for you. Do not share this medicine with others. What if I miss a dose? Keep appointments for follow-up doses. It is important not to miss your dose. Call your care team if you are unable to keep an appointment. What may interact with this medication? Do not take this medication with any of the following: Live virus vaccines This medication may also interact with the following: Certain antibiotics, such as amikacin, gentamicin, neomycin, polymyxin B, streptomycin, tobramycin, vancomycin Foscarnet This list may not describe all possible interactions. Give your health care provider a list of all the medicines, herbs, non-prescription drugs, or dietary supplements you use. Also tell them if you smoke, drink alcohol, or use illegal drugs. Some items may interact with your medicine. What should I watch for while using this medication? Your condition will be monitored carefully while you are receiving this medication. You may need blood work done while taking this medication. This medication may make you feel generally unwell. This is not uncommon, as chemotherapy can affect healthy cells as well as cancer cells. Report any side effects. Continue your course of treatment even though you feel ill unless your care team tells you to stop. This medication may increase your risk of getting an infection. Call your care team for advice if you get a fever, chills, sore throat, or other symptoms of a cold or flu. Do not treat yourself. Try to avoid being around people who are sick. Avoid taking medications that contain aspirin, acetaminophen, ibuprofen, naproxen, or ketoprofen unless instructed by your care team. These medications may hide a fever. This medication may increase your risk to bruise or bleed. Call your care team if you notice any unusual  bleeding. Be careful brushing or flossing your teeth or using a toothpick because you may get an infection or bleed more easily. If you have any dental work done, tell your dentist you are receiving this medication. Drink fluids as directed while you are taking this medication. This will help protect your kidneys. Call your care team if you get diarrhea. Do not treat yourself. Talk to your care team if you or your partner wish to become pregnant or think you might be pregnant. This medication can cause serious birth defects if taken during pregnancy and for 14 months after the last dose. A negative pregnancy test is required before starting this medication. A reliable form of contraception is recommended while taking this medication and for 14 months after the last dose. Talk to your care team about   effective forms of contraception. Do not father a child while taking this medication and for 11 months after the last dose. Use a condom during sex during this time period. Do not breast-feed while taking this medication. This medication may cause infertility. Talk to your care team if you are concerned about your fertility. What side effects may I notice from receiving this medication? Side effects that you should report to your care team as soon as possible: Allergic reactions--skin rash, itching, hives, swelling of the face, lips, tongue, or throat Eye pain, change in vision, vision loss Hearing loss, ringing in ears Infection--fever, chills, cough, sore throat, wounds that don't heal, pain or trouble when passing urine, general feeling of discomfort or being unwell Kidney injury--decrease in the amount of urine, swelling of the ankles, hands, or feet Low red blood cell level--unusual weakness or fatigue, dizziness, headache, trouble breathing Painful swelling, warmth, or redness of the skin, blisters or sores at the infusion site Pain, tingling, or numbness in the hands or feet Unusual bruising or  bleeding Side effects that usually do not require medical attention (report to your care team if they continue or are bothersome): Hair loss Nausea Vomiting This list may not describe all possible side effects. Call your doctor for medical advice about side effects. You may report side effects to FDA at 1-800-FDA-1088. Where should I keep my medication? This medication is given in a hospital or clinic. It will not be stored at home. NOTE: This sheet is a summary. It may not cover all possible information. If you have questions about this medicine, talk to your doctor, pharmacist, or health care provider.  2023 Elsevier/Gold Standard (2021-06-14 00:00:00) 

## 2021-12-04 ENCOUNTER — Other Ambulatory Visit: Payer: Self-pay | Admitting: Oncology

## 2021-12-09 ENCOUNTER — Inpatient Hospital Stay: Payer: Managed Care, Other (non HMO)

## 2021-12-09 ENCOUNTER — Other Ambulatory Visit: Payer: Self-pay | Admitting: *Deleted

## 2021-12-09 VITALS — BP 129/67 | HR 72 | Temp 98.0°F | Resp 18

## 2021-12-09 DIAGNOSIS — Z5112 Encounter for antineoplastic immunotherapy: Secondary | ICD-10-CM | POA: Diagnosis not present

## 2021-12-09 DIAGNOSIS — C221 Intrahepatic bile duct carcinoma: Secondary | ICD-10-CM

## 2021-12-09 LAB — CBC WITH DIFFERENTIAL (CANCER CENTER ONLY)
Abs Immature Granulocytes: 0.02 10*3/uL (ref 0.00–0.07)
Basophils Absolute: 0.1 10*3/uL (ref 0.0–0.1)
Basophils Relative: 1 %
Eosinophils Absolute: 0.1 10*3/uL (ref 0.0–0.5)
Eosinophils Relative: 2 %
HCT: 27.4 % — ABNORMAL LOW (ref 39.0–52.0)
Hemoglobin: 9.2 g/dL — ABNORMAL LOW (ref 13.0–17.0)
Immature Granulocytes: 0 %
Lymphocytes Relative: 34 %
Lymphs Abs: 1.6 10*3/uL (ref 0.7–4.0)
MCH: 30.3 pg (ref 26.0–34.0)
MCHC: 33.6 g/dL (ref 30.0–36.0)
MCV: 90.1 fL (ref 80.0–100.0)
Monocytes Absolute: 0.6 10*3/uL (ref 0.1–1.0)
Monocytes Relative: 13 %
Neutro Abs: 2.4 10*3/uL (ref 1.7–7.7)
Neutrophils Relative %: 50 %
Platelet Count: 342 10*3/uL (ref 150–400)
RBC: 3.04 MIL/uL — ABNORMAL LOW (ref 4.22–5.81)
RDW: 17.1 % — ABNORMAL HIGH (ref 11.5–15.5)
WBC Count: 4.7 10*3/uL (ref 4.0–10.5)
nRBC: 0 % (ref 0.0–0.2)

## 2021-12-09 LAB — CMP (CANCER CENTER ONLY)
ALT: 32 U/L (ref 0–44)
AST: 17 U/L (ref 15–41)
Albumin: 3.9 g/dL (ref 3.5–5.0)
Alkaline Phosphatase: 171 U/L — ABNORMAL HIGH (ref 38–126)
Anion gap: 9 (ref 5–15)
BUN: 12 mg/dL (ref 6–20)
CO2: 26 mmol/L (ref 22–32)
Calcium: 9.6 mg/dL (ref 8.9–10.3)
Chloride: 97 mmol/L — ABNORMAL LOW (ref 98–111)
Creatinine: 0.85 mg/dL (ref 0.61–1.24)
GFR, Estimated: >60 mL/min (ref >60)
Glucose, Bld: 130 mg/dL — ABNORMAL HIGH (ref 70–99)
Potassium: 3.8 mmol/L (ref 3.5–5.1)
Sodium: 132 mmol/L — ABNORMAL LOW (ref 135–145)
Total Bilirubin: 0.5 mg/dL (ref 0.3–1.2)
Total Protein: 7.3 g/dL (ref 6.5–8.1)

## 2021-12-09 LAB — MAGNESIUM: Magnesium: 1.8 mg/dL (ref 1.7–2.4)

## 2021-12-09 MED ORDER — POTASSIUM CHLORIDE IN NACL 20-0.9 MEQ/L-% IV SOLN
Freq: Once | INTRAVENOUS | Status: AC
  Filled 2021-12-09: qty 1000

## 2021-12-09 MED ORDER — SODIUM CHLORIDE 0.9 % IV SOLN: Freq: Once | INTRAVENOUS | Status: AC

## 2021-12-09 MED ORDER — SODIUM CHLORIDE 0.9 % IV SOLN
10.0000 mg | Freq: Once | INTRAVENOUS | Status: AC
  Administered 2021-12-09: 10 mg via INTRAVENOUS
  Filled 2021-12-09: qty 10

## 2021-12-09 MED ORDER — SODIUM CHLORIDE 0.9 % IV SOLN
150.0000 mg | Freq: Once | INTRAVENOUS | Status: AC
  Administered 2021-12-09: 150 mg via INTRAVENOUS
  Filled 2021-12-09: qty 5

## 2021-12-09 MED ORDER — SODIUM CHLORIDE 0.9 % IV SOLN
1000.0000 mg/m2 | Freq: Once | INTRAVENOUS | Status: AC
  Administered 2021-12-09: 2090 mg via INTRAVENOUS
  Filled 2021-12-09: qty 52.6

## 2021-12-09 MED ORDER — SODIUM CHLORIDE 0.9 % IV SOLN
50.0000 mg | Freq: Once | INTRAVENOUS | Status: AC
  Administered 2021-12-09: 50 mg via INTRAVENOUS
  Filled 2021-12-09: qty 50

## 2021-12-09 MED ORDER — HEPARIN SOD (PORK) LOCK FLUSH 100 UNIT/ML IV SOLN
500.0000 [IU] | Freq: Once | INTRAVENOUS | Status: AC | PRN
  Administered 2021-12-09: 500 [IU]

## 2021-12-09 MED ORDER — NEULASTA 6 MG/0.6ML ~~LOC~~ SOSY: 6.0000 mg | PREFILLED_SYRINGE | SUBCUTANEOUS | 1 refills | Status: DC

## 2021-12-09 MED ORDER — PALONOSETRON HCL INJECTION 0.25 MG/5ML
0.2500 mg | Freq: Once | INTRAVENOUS | Status: AC
  Administered 2021-12-09: 0.25 mg via INTRAVENOUS
  Filled 2021-12-09: qty 5

## 2021-12-09 MED ORDER — SODIUM CHLORIDE 0.9% FLUSH
10.0000 mL | INTRAVENOUS | Status: DC | PRN
  Administered 2021-12-09: 10 mL

## 2021-12-09 MED ORDER — MAGNESIUM SULFATE 2 GM/50ML IV SOLN
2.0000 g | Freq: Once | INTRAVENOUS | Status: AC
  Administered 2021-12-09: 2 g via INTRAVENOUS
  Filled 2021-12-09: qty 50

## 2021-12-09 NOTE — Patient Instructions (Signed)

## 2021-12-09 NOTE — Patient Instructions (Signed)
[facility]   Discharge Instructions: Thank you for choosing [facility] to provide your oncology and hematology care.   If you have a lab appointment with the Cancer Center, please go directly to the Cancer Center and check in at the registration area.   Wear comfortable clothing and clothing appropriate for easy access to any Portacath or PICC line.   We strive to give you quality time with your provider. You may need to reschedule your appointment if you arrive late (15 or more minutes).  Arriving late affects you and other patients whose appointments are after yours.  Also, if you miss three or more appointments without notifying the office, you may be dismissed from the clinic at the provider's discretion.      For prescription refill requests, have your pharmacy contact our office and allow 72 hours for refills to be completed.    Today you received the following chemotherapy and/or immunotherapy agents Gemzar, Cisplatin.      To help prevent nausea and vomiting after your treatment, we encourage you to take your nausea medication as directed.  BELOW ARE SYMPTOMS THAT SHOULD BE REPORTED IMMEDIATELY: *FEVER GREATER THAN 100.4 F (38 C) OR HIGHER *CHILLS OR SWEATING *NAUSEA AND VOMITING THAT IS NOT CONTROLLED WITH YOUR NAUSEA MEDICATION *UNUSUAL SHORTNESS OF BREATH *UNUSUAL BRUISING OR BLEEDING *URINARY PROBLEMS (pain or burning when urinating, or frequent urination) *BOWEL PROBLEMS (unusual diarrhea, constipation, pain near the anus) TENDERNESS IN MOUTH AND THROAT WITH OR WITHOUT PRESENCE OF ULCERS (sore throat, sores in mouth, or a toothache) UNUSUAL RASH, SWELLING OR PAIN  UNUSUAL VAGINAL DISCHARGE OR ITCHING   Items with * indicate a potential emergency and should be followed up as soon as possible or go to the Emergency Department if any problems should occur.  Please show the CHEMOTHERAPY ALERT CARD or IMMUNOTHERAPY ALERT CARD at  check-in to the Emergency Department and triage nurse.  Should you have questions after your visit or need to cancel or reschedule your appointment, please contact [facility]  Dept: [phone]  and follow the prompts.  Office hours are 8:00 a.m. to 4:30 p.m. Monday - Friday. Please note that voicemails left after 4:00 p.m. may not be returned until the following business day.  We are closed weekends and major holidays. You have access to a nurse at all times for urgent questions. Please call the main number to the clinic Dept: [phone] and follow the prompts.   For any non-urgent questions, you may also contact your provider using MyChart. We now offer e-Visits for anyone 18 and older to request care online for non-urgent symptoms. For details visit [email or web address].   Also download the MyChart app! Go to the app store, search "MyChart", open the app, select [facility], and log in with your MyChart username and password.  Masks are optional in the cancer centers. If you would like for your care team to wear a mask while they are taking care of you, please let them know. You may have one support person who is at least 57 years old accompany you for your appointments.  Gemcitabine Injection What is this medication? GEMCITABINE (jem SYE ta been) treats some types of cancer. It works by slowing down the growth of cancer cells. This medicine may be used for other purposes; ask your health care provider or pharmacist if you have questions. COMMON BRAND NAME(S): Gemzar, Infugem What should I tell my care team before I   take this medication? They need to know if you have any of these conditions: Blood disorders Infection Kidney disease Liver disease Lung or breathing disease, such as asthma or COPD Recent or ongoing radiation therapy An unusual or allergic reaction to gemcitabine, other medications, foods, dyes, or preservatives If you or your partner are  pregnant or trying to get pregnant Breast-feeding How should I use this medication? This medication is injected into a vein. It is given by your care team in a hospital or clinic setting. Talk to your care team about the use of this medication in children. Special care may be needed. Overdosage: If you think you have taken too much of this medicine contact a poison control center or emergency room at once. NOTE: This medicine is only for you. Do not share this medicine with others. What if I miss a dose? Keep appointments for follow-up doses. It is important not to miss your dose. Call your care team if you are unable to keep an appointment. What may interact with this medication? Interactions have not been studied. This list may not describe all possible interactions. Give your health care provider a list of all the medicines, herbs, non-prescription drugs, or dietary supplements you use. Also tell them if you smoke, drink alcohol, or use illegal drugs. Some items may interact with your medicine. What should I watch for while using this medication? Your condition will be monitored carefully while you are receiving this medication. This medication may make you feel generally unwell. This is not uncommon, as chemotherapy can affect healthy cells as well as cancer cells. Report any side effects. Continue your course of treatment even though you feel ill unless your care team tells you to stop. In some cases, you may be given additional medications to help with side effects. Follow all directions for their use. This medication may increase your risk of getting an infection. Call your care team for advice if you get a fever, chills, sore throat, or other symptoms of a cold or flu. Do not treat yourself. Try to avoid being around people who are sick. This medication may increase your risk to bruise or bleed. Call your care team if you notice any unusual bleeding. Be careful brushing or flossing your  teeth or using a toothpick because you may get an infection or bleed more easily. If you have any dental work done, tell your dentist you are receiving this medication. Avoid taking medications that contain aspirin, acetaminophen, ibuprofen, naproxen, or ketoprofen unless instructed by your care team. These medications may hide a fever. Talk to your care team if you or your partner wish to become pregnant or think you might be pregnant. This medication can cause serious birth defects if taken during pregnancy and for 6 months after the last dose. A negative pregnancy test is required before starting this medication. A reliable form of contraception is recommended while taking this medication and for 6 months after the last dose. Talk to your care team about effective forms of contraception. Do not father a child while taking this medication and for 3 months after the last dose. Use a condom while having sex during this time period. Do not breastfeed while taking this medication and for at least 1 week after the last dose. This medication may cause infertility. Talk to your care team if you are concerned about your fertility. What side effects may I notice from receiving this medication? Side effects that you should report to your care team   as soon as possible: Allergic reactions--skin rash, itching, hives, swelling of the face, lips, tongue, or throat Capillary leak syndrome--stomach or muscle pain, unusual weakness or fatigue, feeling faint or lightheaded, decrease in the amount of urine, swelling of the ankles, hands, or feet, trouble breathing Infection--fever, chills, cough, sore throat, wounds that don't heal, pain or trouble when passing urine, general feeling of discomfort or being unwell Liver injury--right upper belly pain, loss of appetite, nausea, light-colored stool, dark yellow or brown urine, yellowing skin or eyes, unusual weakness or fatigue Low red blood cell level--unusual weakness or  fatigue, dizziness, headache, trouble breathing Lung injury--shortness of breath or trouble breathing, cough, spitting up blood, chest pain, fever Stomach pain, bloody diarrhea, pale skin, unusual weakness or fatigue, decrease in the amount of urine, which may be signs of hemolytic uremic syndrome Sudden and severe headache, confusion, change in vision, seizures, which may be signs of posterior reversible encephalopathy syndrome (PRES) Unusual bruising or bleeding Side effects that usually do not require medical attention (report to your care team if they continue or are bothersome): Diarrhea Drowsiness Hair loss Nausea Pain, redness, or swelling with sores inside the mouth or throat Vomiting This list may not describe all possible side effects. Call your doctor for medical advice about side effects. You may report side effects to FDA at 1-800-FDA-1088. Where should I keep my medication? This medication is given in a hospital or clinic. It will not be stored at home. NOTE: This sheet is a summary. It may not cover all possible information. If you have questions about this medicine, talk to your doctor, pharmacist, or health care provider.  2023 Elsevier/Gold Standard (2021-06-16 00:00:00)  Cisplatin Injection What is this medication? CISPLATIN (SIS pla tin) treats some types of cancer. It works by slowing down the growth of cancer cells. This medicine may be used for other purposes; ask your health care provider or pharmacist if you have questions. COMMON BRAND NAME(S): Platinol, Platinol -AQ What should I tell my care team before I take this medication? They need to know if you have any of these conditions: Eye disease, vision problems Hearing problems Kidney disease Low blood counts, such as low white cells, platelets, or red blood cells Tingling of the fingers or toes, or other nerve disorder An unusual or allergic reaction to cisplatin, carboplatin, oxaliplatin, other medications,  foods, dyes, or preservatives If you or your partner are pregnant or trying to get pregnant Breast-feeding How should I use this medication? This medication is injected into a vein. It is given by your care team in a hospital or clinic setting. Talk to your care team about the use of this medication in children. Special care may be needed. Overdosage: If you think you have taken too much of this medicine contact a poison control center or emergency room at once. NOTE: This medicine is only for you. Do not share this medicine with others. What if I miss a dose? Keep appointments for follow-up doses. It is important not to miss your dose. Call your care team if you are unable to keep an appointment. What may interact with this medication? Do not take this medication with any of the following: Live virus vaccines This medication may also interact with the following: Certain antibiotics, such as amikacin, gentamicin, neomycin, polymyxin B, streptomycin, tobramycin, vancomycin Foscarnet This list may not describe all possible interactions. Give your health care provider a list of all the medicines, herbs, non-prescription drugs, or dietary supplements you use. Also   tell them if you smoke, drink alcohol, or use illegal drugs. Some items may interact with your medicine. What should I watch for while using this medication? Your condition will be monitored carefully while you are receiving this medication. You may need blood work done while taking this medication. This medication may make you feel generally unwell. This is not uncommon, as chemotherapy can affect healthy cells as well as cancer cells. Report any side effects. Continue your course of treatment even though you feel ill unless your care team tells you to stop. This medication may increase your risk of getting an infection. Call your care team for advice if you get a fever, chills, sore throat, or other symptoms of a cold or flu. Do not treat  yourself. Try to avoid being around people who are sick. Avoid taking medications that contain aspirin, acetaminophen, ibuprofen, naproxen, or ketoprofen unless instructed by your care team. These medications may hide a fever. This medication may increase your risk to bruise or bleed. Call your care team if you notice any unusual bleeding. Be careful brushing or flossing your teeth or using a toothpick because you may get an infection or bleed more easily. If you have any dental work done, tell your dentist you are receiving this medication. Drink fluids as directed while you are taking this medication. This will help protect your kidneys. Call your care team if you get diarrhea. Do not treat yourself. Talk to your care team if you or your partner wish to become pregnant or think you might be pregnant. This medication can cause serious birth defects if taken during pregnancy and for 14 months after the last dose. A negative pregnancy test is required before starting this medication. A reliable form of contraception is recommended while taking this medication and for 14 months after the last dose. Talk to your care team about effective forms of contraception. Do not father a child while taking this medication and for 11 months after the last dose. Use a condom during sex during this time period. Do not breast-feed while taking this medication. This medication may cause infertility. Talk to your care team if you are concerned about your fertility. What side effects may I notice from receiving this medication? Side effects that you should report to your care team as soon as possible: Allergic reactions--skin rash, itching, hives, swelling of the face, lips, tongue, or throat Eye pain, change in vision, vision loss Hearing loss, ringing in ears Infection--fever, chills, cough, sore throat, wounds that don't heal, pain or trouble when passing urine, general feeling of discomfort or being unwell Kidney  injury--decrease in the amount of urine, swelling of the ankles, hands, or feet Low red blood cell level--unusual weakness or fatigue, dizziness, headache, trouble breathing Painful swelling, warmth, or redness of the skin, blisters or sores at the infusion site Pain, tingling, or numbness in the hands or feet Unusual bruising or bleeding Side effects that usually do not require medical attention (report to your care team if they continue or are bothersome): Hair loss Nausea Vomiting This list may not describe all possible side effects. Call your doctor for medical advice about side effects. You may report side effects to FDA at 1-800-FDA-1088. Where should I keep my medication? This medication is given in a hospital or clinic. It will not be stored at home. NOTE: This sheet is a summary. It may not cover all possible information. If you have questions about this medicine, talk to your doctor, pharmacist, or   health care provider.  2023 Elsevier/Gold Standard (2021-06-14 00:00:00) 

## 2021-12-15 ENCOUNTER — Ambulatory Visit (HOSPITAL_COMMUNITY)
Admission: RE | Admit: 2021-12-15 | Discharge: 2021-12-15 | Disposition: A | Discharge status: Home / Self Care | Payer: Managed Care, Other (non HMO) | Source: Ambulatory Visit | Attending: Oncology | Admitting: Oncology

## 2021-12-15 DIAGNOSIS — C221 Intrahepatic bile duct carcinoma: Secondary | ICD-10-CM | POA: Insufficient documentation

## 2021-12-15 MED ORDER — GADOXETATE DISODIUM 0.25 MMOL/ML IV SOLN
9.0000 mL | Freq: Once | INTRAVENOUS | Status: AC | PRN
  Administered 2021-12-15: 9 mL via INTRAVENOUS

## 2021-12-17 ENCOUNTER — Telehealth: Payer: Self-pay

## 2021-12-17 NOTE — Telephone Encounter (Signed)
Jeffrey Blevins is schedule with Dr. Fayrene Helper on 12/27/21.

## 2021-12-18 ENCOUNTER — Other Ambulatory Visit: Payer: Self-pay | Admitting: Oncology

## 2021-12-18 DIAGNOSIS — C221 Intrahepatic bile duct carcinoma: Secondary | ICD-10-CM

## 2021-12-23 ENCOUNTER — Inpatient Hospital Stay: Payer: Managed Care, Other (non HMO)

## 2021-12-23 ENCOUNTER — Inpatient Hospital Stay: Payer: Managed Care, Other (non HMO) | Admitting: Oncology

## 2021-12-23 ENCOUNTER — Encounter: Payer: Self-pay | Admitting: *Deleted

## 2021-12-23 VITALS — BP 135/87 | HR 65 | Temp 98.1°F | Resp 18 | Ht 74.0 in | Wt 198.0 lb

## 2021-12-23 DIAGNOSIS — C221 Intrahepatic bile duct carcinoma: Secondary | ICD-10-CM | POA: Diagnosis not present

## 2021-12-23 DIAGNOSIS — Z5112 Encounter for antineoplastic immunotherapy: Secondary | ICD-10-CM | POA: Diagnosis not present

## 2021-12-23 LAB — CBC WITH DIFFERENTIAL (CANCER CENTER ONLY)
Abs Immature Granulocytes: 0.03 10*3/uL (ref 0.00–0.07)
Basophils Absolute: 0 10*3/uL (ref 0.0–0.1)
Basophils Relative: 1 %
Eosinophils Absolute: 0.1 10*3/uL (ref 0.0–0.5)
Eosinophils Relative: 2 %
HCT: 30.1 % — ABNORMAL LOW (ref 39.0–52.0)
Hemoglobin: 9.9 g/dL — ABNORMAL LOW (ref 13.0–17.0)
Immature Granulocytes: 0 %
Lymphocytes Relative: 28 %
Lymphs Abs: 1.9 10*3/uL (ref 0.7–4.0)
MCH: 30.2 pg (ref 26.0–34.0)
MCHC: 32.9 g/dL (ref 30.0–36.0)
MCV: 91.8 fL (ref 80.0–100.0)
Monocytes Absolute: 0.7 10*3/uL (ref 0.1–1.0)
Monocytes Relative: 11 %
Neutro Abs: 3.9 10*3/uL (ref 1.7–7.7)
Neutrophils Relative %: 58 %
Platelet Count: 386 10*3/uL (ref 150–400)
RBC: 3.28 MIL/uL — ABNORMAL LOW (ref 4.22–5.81)
RDW: 17.2 % — ABNORMAL HIGH (ref 11.5–15.5)
WBC Count: 6.7 10*3/uL (ref 4.0–10.5)
nRBC: 0 % (ref 0.0–0.2)

## 2021-12-23 LAB — CMP (CANCER CENTER ONLY)
ALT: 15 U/L (ref 0–44)
AST: 16 U/L (ref 15–41)
Albumin: 4 g/dL (ref 3.5–5.0)
Alkaline Phosphatase: 221 U/L — ABNORMAL HIGH (ref 38–126)
Anion gap: 9 (ref 5–15)
BUN: 13 mg/dL (ref 6–20)
CO2: 25 mmol/L (ref 22–32)
Calcium: 9.3 mg/dL (ref 8.9–10.3)
Chloride: 101 mmol/L (ref 98–111)
Creatinine: 0.89 mg/dL (ref 0.61–1.24)
GFR, Estimated: >60 mL/min (ref >60)
Glucose, Bld: 126 mg/dL — ABNORMAL HIGH (ref 70–99)
Potassium: 3.8 mmol/L (ref 3.5–5.1)
Sodium: 135 mmol/L (ref 135–145)
Total Bilirubin: 0.4 mg/dL (ref 0.3–1.2)
Total Protein: 6.9 g/dL (ref 6.5–8.1)

## 2021-12-23 LAB — MAGNESIUM: Magnesium: 1.8 mg/dL (ref 1.7–2.4)

## 2021-12-23 MED ORDER — LIDOCAINE-PRILOCAINE 2.5-2.5 % EX CREA: 1.0000 | TOPICAL_CREAM | CUTANEOUS | 1 refills | Status: DC | PRN

## 2021-12-23 MED ORDER — AMLODIPINE BESYLATE 10 MG PO TABS: 10.0000 mg | ORAL_TABLET | Freq: Every day | ORAL | 0 refills | Status: DC

## 2021-12-23 NOTE — Progress Notes (Signed)
[facility] OFFICE PROGRESS NOTE   Diagnosis: Cholangiocarcinoma  INTERVAL HISTORY:   [name] returns as scheduled.  He completed another cycle of gemcitabine/cisplatin/durvalumab beginning 12/03/2021.  He had mild nausea following chemotherapy.  He has noted improvement of the symptoms.  No rash or diarrhea.  The biliary drain remains capped.  Objective:  Vital signs in last 24 hours:  Blood pressure 135/87, pulse 65, temperature 98.1 F (36.7 C), temperature source Oral, resp. rate 18, height '6\' 2"'$  (1.88 m), weight 198 lb (89.8 kg), SpO2 100 %.    HEENT: No thrush or ulcers Resp: Clear bilaterally Cardio: Regular rate and rhythm GI: Right upper quadrant biliary drain with a gauze dressing, no hepatomegaly, no mass, nontender Vascular: No leg edema   Portacath/PICC-without erythema  Lab Results:  Lab Results  Component Value Date   WBC 6.7 12/23/2021   HGB 9.9 (L) 12/23/2021   HCT 30.1 (L) 12/23/2021   MCV 91.8 12/23/2021   PLT 386 12/23/2021   NEUTROABS 3.9 12/23/2021    CMP  Lab Results  Component Value Date   NA 135 12/23/2021   K 3.8 12/23/2021   CL 101 12/23/2021   CO2 25 12/23/2021   GLUCOSE 126 (H) 12/23/2021   BUN 13 12/23/2021   CREATININE 0.89 12/23/2021   CALCIUM 9.3 12/23/2021   PROT 6.9 12/23/2021   ALBUMIN 4.0 12/23/2021   AST 16 12/23/2021   ALT 15 12/23/2021   ALKPHOS 221 (H) 12/23/2021   BILITOT 0.4 12/23/2021   GFRNONAA >60 12/23/2021    Lab Results  Component Value Date   CAN199 159 (H) 09/30/2021    Lab Results  Component Value Date   INR 1.1 08/25/2021   LABPROT 13.9 08/25/2021    Imaging:  No results found.  Medications: I have reviewed the patient's current medications.   Assessment/Plan: Liver mass concerning for malignancy -CT abdomen/pelvis with contrast 08/14/1998 23-2.9 x 2.7 x 2.7 cm hypoenhancing mass in segment 5 near the liver hilum just above hepatic ductal confluence suspicious for primary  bile duct malignancy or primary hepatic malignancy -MRCP 08/14/2021-irregular mass of segment 8 of the liver with perilesional enhancement concerning for cholangiocarcinoma -08/14/2021 CA 19.9 was elevated at 76 -08/16/2021 AFP 6.4 -08/16/2021 cytology from bile duct brushing showed atypical cells -08/19/2021 cytology from bile duct brushing showed cells suspicious for malignancy -08/25/2021 CT-guided liver biopsy-moderately differentiated adenocarcinoma, CK7 positive, CDX2 positive in a small population of cells, TTF-1 negative, CK20 negative-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 -[facility] at [facility] 09/06/2021-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, occlusion of the anterior branch of the right portal vein, periportal enhancement and thickening on delayed imaging potentially due to tumor infiltration, no change in the 2 x 3.2 cm mass in segment 8, mild to moderate left and right intrahepatic biliary dilatation, biliary stent in place, small portacaval and periportal nodes, no evidence of metastatic disease in the chest -Cycle 1 gemcitabine/cisplatin/durvalumab 10/01/2021 -Cycle 2 gemcitabine/cisplatin/durvalumab 10/22/2021 -Cycle 3 gemcitabine/cisplatin/durvalumab 11/11/2021, D1 cisplatin held due to neuropathy, cisplatin resumed day 8 -Cycle 4 gemcitabine/cisplatin/durvalumab 12/03/2021 -MRI abdomen 12/15/2021-segment 8 cholangiocarcinoma no longer measurable, residual biliary duct dilation in the anterior right and left hepatic lobes, no evidence of metastatic disease persistent anterior right and new peripheral left portal vein occlusion -Cycle 5 gemcitabine/cisplatin/durvalumab 12/24/2021 2.  Obstructive jaundice -08/16/2021 percutaneous biliary drain placement 3.  Hepatic steatosis 4.  GERD 5.  Hypertension 6.  Normocytic anemia 7.  Enlarged prostate seen on CT 8.  Tobacco use 9.  Enterococcus and Aerococcus bacteremia  08/20/2021-discharged 08/26/2021 to complete outpatient course of Augmentin 10.  Peripheral neuropathy 11.  Port-A-Cath placement 09/23/2021 12.  Biliary drain exchange 09/23/2021; drain capped 10/12/2021 13.  Rigors following biliary drain procedure 09/23/2021, biliary drain culture-Enterobacter Cloacae, Bactrim x7 days 09/27/2021 14.  Right leg edema and pain 10/18/2021-Doppler negative for DVT        Disposition: Mr Peine has completed 4 cycles of systemic therapy for treatment of locally advanced cholangiocarcinoma.  He has tolerated the treatment well.  The MRI reveals evidence of decrease in the primary hepatic mass.  There is residual right anterior portal vein and new left peripheral portal vein occlusion. He will begin cycle 5 gemcitabine/cisplatin/durvalumab tomorrow.  He is scheduled to see Dr. Fayrene Helper on 12/27/2021.  I will communicate with Dr. Fayrene Helper regarding the location for surgery.  He will be scheduled for cycle 6 systemic therapy and an office visit in 3 weeks.  I reviewed the MRI images with Mr. Whittenberg and his wife.  Betsy Coder, MD  12/23/2021  11:09 AM

## 2021-12-23 NOTE — Progress Notes (Signed)
Patient seen by Dr. Benay Spice today  Vitals are within treatment parameters.  Labs reviewed by Dr. Benay Spice and are within treatment parameters.  Per physician team, patient is ready for treatment and there are NO modifications to the treatment plan. OK to treat on 12/24/21.

## 2021-12-24 ENCOUNTER — Other Ambulatory Visit: Payer: Self-pay

## 2021-12-24 ENCOUNTER — Inpatient Hospital Stay: Payer: Managed Care, Other (non HMO)

## 2021-12-24 VITALS — BP 125/75 | HR 63 | Temp 98.2°F | Resp 18

## 2021-12-24 DIAGNOSIS — C221 Intrahepatic bile duct carcinoma: Secondary | ICD-10-CM

## 2021-12-24 DIAGNOSIS — Z5112 Encounter for antineoplastic immunotherapy: Secondary | ICD-10-CM | POA: Diagnosis not present

## 2021-12-24 MED ORDER — SODIUM CHLORIDE 0.9 % IV SOLN
50.0000 mg | Freq: Once | INTRAVENOUS | Status: AC
  Administered 2021-12-24: 50 mg via INTRAVENOUS
  Filled 2021-12-24: qty 50

## 2021-12-24 MED ORDER — SODIUM CHLORIDE 0.9% FLUSH
10.0000 mL | INTRAVENOUS | Status: DC | PRN
  Administered 2021-12-24: 10 mL

## 2021-12-24 MED ORDER — SODIUM CHLORIDE 0.9 % IV SOLN
10.0000 mg | Freq: Once | INTRAVENOUS | Status: AC
  Administered 2021-12-24: 10 mg via INTRAVENOUS
  Filled 2021-12-24: qty 10

## 2021-12-24 MED ORDER — SODIUM CHLORIDE 0.9 % IV SOLN
150.0000 mg | Freq: Once | INTRAVENOUS | Status: AC
  Administered 2021-12-24: 150 mg via INTRAVENOUS
  Filled 2021-12-24: qty 150

## 2021-12-24 MED ORDER — SODIUM CHLORIDE 0.9 % IV SOLN
1000.0000 mg/m2 | Freq: Once | INTRAVENOUS | Status: AC
  Administered 2021-12-24: 2090 mg via INTRAVENOUS
  Filled 2021-12-24: qty 52.6

## 2021-12-24 MED ORDER — HEPARIN SOD (PORK) LOCK FLUSH 100 UNIT/ML IV SOLN
500.0000 [IU] | Freq: Once | INTRAVENOUS | Status: AC | PRN
  Administered 2021-12-24: 500 [IU]

## 2021-12-24 MED ORDER — SODIUM CHLORIDE 0.9 % IV SOLN
1500.0000 mg | Freq: Once | INTRAVENOUS | Status: AC
  Administered 2021-12-24: 1500 mg via INTRAVENOUS
  Filled 2021-12-24: qty 30

## 2021-12-24 MED ORDER — MAGNESIUM SULFATE 2 GM/50ML IV SOLN
2.0000 g | Freq: Once | INTRAVENOUS | Status: AC
  Administered 2021-12-24: 2 g via INTRAVENOUS
  Filled 2021-12-24: qty 50

## 2021-12-24 MED ORDER — PALONOSETRON HCL INJECTION 0.25 MG/5ML
0.2500 mg | Freq: Once | INTRAVENOUS | Status: AC
  Administered 2021-12-24: 0.25 mg via INTRAVENOUS
  Filled 2021-12-24: qty 5

## 2021-12-24 MED ORDER — POTASSIUM CHLORIDE IN NACL 20-0.9 MEQ/L-% IV SOLN
Freq: Once | INTRAVENOUS | Status: AC
  Filled 2021-12-24: qty 1000

## 2021-12-24 MED ORDER — SODIUM CHLORIDE 0.9 % IV SOLN: Freq: Once | INTRAVENOUS | Status: AC

## 2021-12-24 NOTE — Patient Instructions (Signed)
[facility]  Discharge Instructions: Thank you for choosing [facility] to provide your oncology and hematology care.   If you have a lab appointment with the [facility], please go directly to the [facility] and check in at the registration area.   Wear comfortable clothing and clothing appropriate for easy access to any Portacath or PICC line.   We strive to give you quality time with your provider. You may need to reschedule your appointment if you arrive late (15 or more minutes).  Arriving late affects you and other patients whose appointments are after yours.  Also, if you miss three or more appointments without notifying the office, you may be dismissed from the clinic at the provider's discretion.      For prescription refill requests, have your pharmacy contact our office and allow 72 hours for refills to be completed.    Today you received the following chemotherapy and/or immunotherapy agents: durvalumab, gemcitabine, cisplatin.       To help prevent nausea and vomiting after your treatment, we encourage you to take your nausea medication as directed.  BELOW ARE SYMPTOMS THAT SHOULD BE REPORTED IMMEDIATELY: *FEVER GREATER THAN 100.4 F (38 C) OR HIGHER *CHILLS OR SWEATING *NAUSEA AND VOMITING THAT IS NOT CONTROLLED WITH YOUR NAUSEA MEDICATION *UNUSUAL SHORTNESS OF BREATH *UNUSUAL BRUISING OR BLEEDING *URINARY PROBLEMS (pain or burning when urinating, or frequent urination) *BOWEL PROBLEMS (unusual diarrhea, constipation, pain near the anus) TENDERNESS IN MOUTH AND THROAT WITH OR WITHOUT PRESENCE OF ULCERS (sore throat, sores in mouth, or a toothache) UNUSUAL RASH, SWELLING OR PAIN  UNUSUAL VAGINAL DISCHARGE OR ITCHING   Items with * indicate a potential emergency and should be followed up as soon as possible or go to the Emergency Department if any problems should occur.  Please show the CHEMOTHERAPY ALERT CARD or IMMUNOTHERAPY  ALERT CARD at check-in to the Emergency Department and triage nurse.  Should you have questions after your visit or need to cancel or reschedule your appointment, please contact [facility]  Dept: [phone]  and follow the prompts.  Office hours are 8:00 a.m. to 4:30 p.m. Monday - Friday. Please note that voicemails left after 4:00 p.m. may not be returned until the following business day.  We are closed weekends and major holidays. You have access to a nurse at all times for urgent questions. Please call the main number to the clinic Dept: [phone] and follow the prompts.   For any non-urgent questions, you may also contact your provider using MyChart. We now offer e-Visits for anyone 21 and older to request care online for non-urgent symptoms. For details visit [email or web address].   Also download the MyChart app! Go to the app store, search "MyChart", open the app, select [facility], and log in with your MyChart username and password.  Masks are optional in the cancer centers. If you would like for your care team to wear a mask while they are taking care of you, please let them know. You may have one support person who is at least 57 years old accompany you for your appointments.  Durvalumab Injection What is this medication? DURVALUMAB (dur VAL ue mab) treats some types of cancer. It works by helping your immune system slow or stop the spread of cancer cells. It is a monoclonal antibody. This medicine may be used for other purposes; ask your health care provider or pharmacist if you have questions. COMMON BRAND NAME(S):  IMFINZI What should I tell my care team before I take this medication? They need to know if you have any of these conditions: Allogeneic stem cell transplant (uses someone else's stem cells) Autoimmune diseases, such as Crohn disease, ulcerative colitis, lupus History of chest radiation Nervous system problems, such as Guillain-Barre  syndrome, myasthenia gravis Organ transplant An unusual or allergic reaction to durvalumab, other medications, foods, dyes, or preservatives Pregnant or trying to get pregnant Breast-feeding How should I use this medication? This medication is infused into a vein. It is given by your care team in a hospital or clinic setting. A special MedGuide will be given to you before each treatment. Be sure to read this information carefully each time. Talk to your care team about the use of this medication in children. Special care may be needed. Overdosage: If you think you have taken too much of this medicine contact a poison control center or emergency room at once. NOTE: This medicine is only for you. Do not share this medicine with others. What if I miss a dose? Keep appointments for follow-up doses. It is important not to miss your dose. Call your care team if you are unable to keep an appointment. What may interact with this medication? Interactions have not been studied. This list may not describe all possible interactions. Give your health care provider a list of all the medicines, herbs, non-prescription drugs, or dietary supplements you use. Also tell them if you smoke, drink alcohol, or use illegal drugs. Some items may interact with your medicine. What should I watch for while using this medication? Your condition will be monitored carefully while you are receiving this medication. You may need blood work while taking this medication. This medication may cause serious skin reactions. They can happen weeks to months after starting the medication. Contact your care team right away if you notice fevers or flu-like symptoms with a rash. The rash may be red or purple and then turn into blisters or peeling of the skin. You may also notice a red rash with swelling of the face, lips, or lymph nodes in your neck or under your arms. Tell your care team right away if you have any change in your  eyesight. Talk to your care team if you may be pregnant. Serious birth defects can occur if you take this medication during pregnancy and for 3 months after the last dose. You will need a negative pregnancy test before starting this medication. Contraception is recommended while taking this medication and for 3 months after the last dose. Your care team can help you find the option that works for you. Do not breastfeed while taking this medication and for 3 months after the last dose. What side effects may I notice from receiving this medication? Side effects that you should report to your care team as soon as possible: Allergic reactions--skin rash, itching, hives, swelling of the face, lips, tongue, or throat Dry cough, shortness of breath or trouble breathing Eye pain, redness, irritation, or discharge with blurry or decreased vision Heart muscle inflammation--unusual weakness or fatigue, shortness of breath, chest pain, fast or irregular heartbeat, dizziness, swelling of the ankles, feet, or hands Hormone gland problems--headache, sensitivity to light, unusual weakness or fatigue, dizziness, fast or irregular heartbeat, increased sensitivity to cold or heat, excessive sweating, constipation, hair loss, increased thirst or amount of urine, tremors or shaking, irritability Infusion reactions--chest pain, shortness of breath or trouble breathing, feeling faint or lightheaded Kidney injury (glomerulonephritis)--decrease in  the amount of urine, red or dark brown urine, foamy or bubbly urine, swelling of the ankles, hands, or feet Liver injury--right upper belly pain, loss of appetite, nausea, light-colored stool, dark yellow or brown urine, yellowing skin or eyes, unusual weakness or fatigue Pain, tingling, or numbness in the hands or feet, muscle weakness, change in vision, confusion or trouble speaking, loss of balance or coordination, trouble walking, seizures Rash, fever, and swollen lymph  nodes Redness, blistering, peeling, or loosening of the skin, including inside the mouth Sudden or severe stomach pain, bloody diarrhea, fever, nausea, vomiting Side effects that usually do not require medical attention (report these to your care team if they continue or are bothersome): Bone, joint, or muscle pain Diarrhea Fatigue Loss of appetite Nausea Skin rash This list may not describe all possible side effects. Call your doctor for medical advice about side effects. You may report side effects to FDA at 1-800-FDA-1088. Where should I keep my medication? This medication is given in a hospital or clinic. It will not be stored at home. NOTE: This sheet is a summary. It may not cover all possible information. If you have questions about this medicine, talk to your doctor, pharmacist, or health care provider.  2023 Elsevier/Gold Standard (2020-08-05 00:00:00)   Gemcitabine Injection What is this medication? GEMCITABINE (jem SYE ta been) treats some types of cancer. It works by slowing down the growth of cancer cells. This medicine may be used for other purposes; ask your health care provider or pharmacist if you have questions. COMMON BRAND NAME(S): Gemzar, Infugem What should I tell my care team before I take this medication? They need to know if you have any of these conditions: Blood disorders Infection Kidney disease Liver disease Lung or breathing disease, such as asthma or COPD Recent or ongoing radiation therapy An unusual or allergic reaction to gemcitabine, other medications, foods, dyes, or preservatives If you or your partner are pregnant or trying to get pregnant Breast-feeding How should I use this medication? This medication is injected into a vein. It is given by your care team in a hospital or clinic setting. Talk to your care team about the use of this medication in children. Special care may be needed. Overdosage: If you think you have taken too much of this  medicine contact a poison control center or emergency room at once. NOTE: This medicine is only for you. Do not share this medicine with others. What if I miss a dose? Keep appointments for follow-up doses. It is important not to miss your dose. Call your care team if you are unable to keep an appointment. What may interact with this medication? Interactions have not been studied. This list may not describe all possible interactions. Give your health care provider a list of all the medicines, herbs, non-prescription drugs, or dietary supplements you use. Also tell them if you smoke, drink alcohol, or use illegal drugs. Some items may interact with your medicine. What should I watch for while using this medication? Your condition will be monitored carefully while you are receiving this medication. This medication may make you feel generally unwell. This is not uncommon, as chemotherapy can affect healthy cells as well as cancer cells. Report any side effects. Continue your course of treatment even though you feel ill unless your care team tells you to stop. In some cases, you may be given additional medications to help with side effects. Follow all directions for their use. This medication may increase your risk  of getting an infection. Call your care team for advice if you get a fever, chills, sore throat, or other symptoms of a cold or flu. Do not treat yourself. Try to avoid being around people who are sick. This medication may increase your risk to bruise or bleed. Call your care team if you notice any unusual bleeding. Be careful brushing or flossing your teeth or using a toothpick because you may get an infection or bleed more easily. If you have any dental work done, tell your dentist you are receiving this medication. Avoid taking medications that contain aspirin, acetaminophen, ibuprofen, naproxen, or ketoprofen unless instructed by your care team. These medications may hide a fever. Talk to  your care team if you or your partner wish to become pregnant or think you might be pregnant. This medication can cause serious birth defects if taken during pregnancy and for 6 months after the last dose. A negative pregnancy test is required before starting this medication. A reliable form of contraception is recommended while taking this medication and for 6 months after the last dose. Talk to your care team about effective forms of contraception. Do not father a child while taking this medication and for 3 months after the last dose. Use a condom while having sex during this time period. Do not breastfeed while taking this medication and for at least 1 week after the last dose. This medication may cause infertility. Talk to your care team if you are concerned about your fertility. What side effects may I notice from receiving this medication? Side effects that you should report to your care team as soon as possible: Allergic reactions--skin rash, itching, hives, swelling of the face, lips, tongue, or throat Capillary leak syndrome--stomach or muscle pain, unusual weakness or fatigue, feeling faint or lightheaded, decrease in the amount of urine, swelling of the ankles, hands, or feet, trouble breathing Infection--fever, chills, cough, sore throat, wounds that don't heal, pain or trouble when passing urine, general feeling of discomfort or being unwell Liver injury--right upper belly pain, loss of appetite, nausea, light-colored stool, dark yellow or brown urine, yellowing skin or eyes, unusual weakness or fatigue Low red blood cell level--unusual weakness or fatigue, dizziness, headache, trouble breathing Lung injury--shortness of breath or trouble breathing, cough, spitting up blood, chest pain, fever Stomach pain, bloody diarrhea, pale skin, unusual weakness or fatigue, decrease in the amount of urine, which may be signs of hemolytic uremic syndrome Sudden and severe headache, confusion, change in  vision, seizures, which may be signs of posterior reversible encephalopathy syndrome (PRES) Unusual bruising or bleeding Side effects that usually do not require medical attention (report to your care team if they continue or are bothersome): Diarrhea Drowsiness Hair loss Nausea Pain, redness, or swelling with sores inside the mouth or throat Vomiting This list may not describe all possible side effects. Call your doctor for medical advice about side effects. You may report side effects to FDA at 1-800-FDA-1088. Where should I keep my medication? This medication is given in a hospital or clinic. It will not be stored at home. NOTE: This sheet is a summary. It may not cover all possible information. If you have questions about this medicine, talk to your doctor, pharmacist, or health care provider.  2023 Elsevier/Gold Standard (2021-06-16 00:00:00)   Cisplatin Injection What is this medication? CISPLATIN (SIS pla tin) treats some types of cancer. It works by slowing down the growth of cancer cells. This medicine may be used for other purposes;  ask your health care provider or pharmacist if you have questions. COMMON BRAND NAME(S): Platinol, Platinol -AQ What should I tell my care team before I take this medication? They need to know if you have any of these conditions: Eye disease, vision problems Hearing problems Kidney disease Low blood counts, such as low white cells, platelets, or red blood cells Tingling of the fingers or toes, or other nerve disorder An unusual or allergic reaction to cisplatin, carboplatin, oxaliplatin, other medications, foods, dyes, or preservatives If you or your partner are pregnant or trying to get pregnant Breast-feeding How should I use this medication? This medication is injected into a vein. It is given by your care team in a hospital or clinic setting. Talk to your care team about the use of this medication in children. Special care may be  needed. Overdosage: If you think you have taken too much of this medicine contact a poison control center or emergency room at once. NOTE: This medicine is only for you. Do not share this medicine with others. What if I miss a dose? Keep appointments for follow-up doses. It is important not to miss your dose. Call your care team if you are unable to keep an appointment. What may interact with this medication? Do not take this medication with any of the following: Live virus vaccines This medication may also interact with the following: Certain antibiotics, such as amikacin, gentamicin, neomycin, polymyxin B, streptomycin, tobramycin, vancomycin Foscarnet This list may not describe all possible interactions. Give your health care provider a list of all the medicines, herbs, non-prescription drugs, or dietary supplements you use. Also tell them if you smoke, drink alcohol, or use illegal drugs. Some items may interact with your medicine. What should I watch for while using this medication? Your condition will be monitored carefully while you are receiving this medication. You may need blood work done while taking this medication. This medication may make you feel generally unwell. This is not uncommon, as chemotherapy can affect healthy cells as well as cancer cells. Report any side effects. Continue your course of treatment even though you feel ill unless your care team tells you to stop. This medication may increase your risk of getting an infection. Call your care team for advice if you get a fever, chills, sore throat, or other symptoms of a cold or flu. Do not treat yourself. Try to avoid being around people who are sick. Avoid taking medications that contain aspirin, acetaminophen, ibuprofen, naproxen, or ketoprofen unless instructed by your care team. These medications may hide a fever. This medication may increase your risk to bruise or bleed. Call your care team if you notice any unusual  bleeding. Be careful brushing or flossing your teeth or using a toothpick because you may get an infection or bleed more easily. If you have any dental work done, tell your dentist you are receiving this medication. Drink fluids as directed while you are taking this medication. This will help protect your kidneys. Call your care team if you get diarrhea. Do not treat yourself. Talk to your care team if you or your partner wish to become pregnant or think you might be pregnant. This medication can cause serious birth defects if taken during pregnancy and for 14 months after the last dose. A negative pregnancy test is required before starting this medication. A reliable form of contraception is recommended while taking this medication and for 14 months after the last dose. Talk to your care team about  effective forms of contraception. Do not father a child while taking this medication and for 11 months after the last dose. Use a condom during sex during this time period. Do not breast-feed while taking this medication. This medication may cause infertility. Talk to your care team if you are concerned about your fertility. What side effects may I notice from receiving this medication? Side effects that you should report to your care team as soon as possible: Allergic reactions--skin rash, itching, hives, swelling of the face, lips, tongue, or throat Eye pain, change in vision, vision loss Hearing loss, ringing in ears Infection--fever, chills, cough, sore throat, wounds that don't heal, pain or trouble when passing urine, general feeling of discomfort or being unwell Kidney injury--decrease in the amount of urine, swelling of the ankles, hands, or feet Low red blood cell level--unusual weakness or fatigue, dizziness, headache, trouble breathing Painful swelling, warmth, or redness of the skin, blisters or sores at the infusion site Pain, tingling, or numbness in the hands or feet Unusual bruising or  bleeding Side effects that usually do not require medical attention (report to your care team if they continue or are bothersome): Hair loss Nausea Vomiting This list may not describe all possible side effects. Call your doctor for medical advice about side effects. You may report side effects to FDA at 1-800-FDA-1088. Where should I keep my medication? This medication is given in a hospital or clinic. It will not be stored at home. NOTE: This sheet is a summary. It may not cover all possible information. If you have questions about this medicine, talk to your doctor, pharmacist, or health care provider.  2023 Elsevier/Gold Standard (2021-06-14 00:00:00)

## 2021-12-24 NOTE — Progress Notes (Signed)
0.9% Sodium Chloride 233m, Lot: [id number] Exp: 05/29/23 used for Gemcitabine and Cisplatin dilution.  PRaul DelLFremont [place] BCPS, BCOP 12/24/2021 10:41 AM

## 2021-12-26 ENCOUNTER — Other Ambulatory Visit: Payer: Self-pay | Admitting: Oncology

## 2021-12-26 DIAGNOSIS — C221 Intrahepatic bile duct carcinoma: Secondary | ICD-10-CM

## 2021-12-31 ENCOUNTER — Other Ambulatory Visit: Payer: Self-pay | Admitting: *Deleted

## 2021-12-31 ENCOUNTER — Inpatient Hospital Stay: Payer: Managed Care, Other (non HMO) | Attending: Oncology

## 2021-12-31 ENCOUNTER — Inpatient Hospital Stay: Payer: Managed Care, Other (non HMO)

## 2021-12-31 DIAGNOSIS — Z79899 Other long term (current) drug therapy: Secondary | ICD-10-CM | POA: Insufficient documentation

## 2021-12-31 DIAGNOSIS — C221 Intrahepatic bile duct carcinoma: Secondary | ICD-10-CM

## 2021-12-31 DIAGNOSIS — K769 Liver disease, unspecified: Secondary | ICD-10-CM | POA: Insufficient documentation

## 2021-12-31 DIAGNOSIS — Z5112 Encounter for antineoplastic immunotherapy: Secondary | ICD-10-CM | POA: Insufficient documentation

## 2021-12-31 DIAGNOSIS — Z5111 Encounter for antineoplastic chemotherapy: Secondary | ICD-10-CM | POA: Diagnosis present

## 2021-12-31 LAB — CMP (CANCER CENTER ONLY)
ALT: 20 U/L (ref 0–44)
AST: 15 U/L (ref 15–41)
Albumin: 3.9 g/dL (ref 3.5–5.0)
Alkaline Phosphatase: 177 U/L — ABNORMAL HIGH (ref 38–126)
Anion gap: 9 (ref 5–15)
BUN: 15 mg/dL (ref 6–20)
CO2: 26 mmol/L (ref 22–32)
Calcium: 9.4 mg/dL (ref 8.9–10.3)
Chloride: 99 mmol/L (ref 98–111)
Creatinine: 0.86 mg/dL (ref 0.61–1.24)
GFR, Estimated: >60 mL/min (ref >60)
Glucose, Bld: 130 mg/dL — ABNORMAL HIGH (ref 70–99)
Potassium: 4 mmol/L (ref 3.5–5.1)
Sodium: 134 mmol/L — ABNORMAL LOW (ref 135–145)
Total Bilirubin: 0.4 mg/dL (ref 0.3–1.2)
Total Protein: 6.8 g/dL (ref 6.5–8.1)

## 2021-12-31 LAB — CBC WITH DIFFERENTIAL (CANCER CENTER ONLY)
Abs Immature Granulocytes: 0.07 10*3/uL (ref 0.00–0.07)
Basophils Absolute: 0 10*3/uL (ref 0.0–0.1)
Basophils Relative: 1 %
Eosinophils Absolute: 0 10*3/uL (ref 0.0–0.5)
Eosinophils Relative: 0 %
HCT: 26.5 % — ABNORMAL LOW (ref 39.0–52.0)
Hemoglobin: 8.7 g/dL — ABNORMAL LOW (ref 13.0–17.0)
Immature Granulocytes: 1 %
Lymphocytes Relative: 34 %
Lymphs Abs: 1.7 10*3/uL (ref 0.7–4.0)
MCH: 29.9 pg (ref 26.0–34.0)
MCHC: 32.8 g/dL (ref 30.0–36.0)
MCV: 91.1 fL (ref 80.0–100.0)
Monocytes Absolute: 0.7 10*3/uL (ref 0.1–1.0)
Monocytes Relative: 15 %
Neutro Abs: 2.5 10*3/uL (ref 1.7–7.7)
Neutrophils Relative %: 49 %
Platelet Count: 257 10*3/uL (ref 150–400)
RBC: 2.91 MIL/uL — ABNORMAL LOW (ref 4.22–5.81)
RDW: 15.9 % — ABNORMAL HIGH (ref 11.5–15.5)
WBC Count: 5.1 10*3/uL (ref 4.0–10.5)
nRBC: 0 % (ref 0.0–0.2)

## 2021-12-31 LAB — MAGNESIUM: Magnesium: 1.7 mg/dL (ref 1.7–2.4)

## 2021-12-31 MED ORDER — SODIUM CHLORIDE 0.9 % IV SOLN: Freq: Once | INTRAVENOUS | Status: AC

## 2021-12-31 MED ORDER — PALONOSETRON HCL INJECTION 0.25 MG/5ML
0.2500 mg | Freq: Once | INTRAVENOUS | Status: AC
  Administered 2021-12-31: 0.25 mg via INTRAVENOUS
  Filled 2021-12-31: qty 5

## 2021-12-31 MED ORDER — SODIUM CHLORIDE 0.9 % IV SOLN
150.0000 mg | Freq: Once | INTRAVENOUS | Status: AC
  Administered 2021-12-31: 150 mg via INTRAVENOUS
  Filled 2021-12-31: qty 5

## 2021-12-31 MED ORDER — SODIUM CHLORIDE 0.9 % IV SOLN
10.0000 mg | Freq: Once | INTRAVENOUS | Status: AC
  Administered 2021-12-31: 10 mg via INTRAVENOUS
  Filled 2021-12-31: qty 1

## 2021-12-31 MED ORDER — HEPARIN SOD (PORK) LOCK FLUSH 100 UNIT/ML IV SOLN
500.0000 [IU] | Freq: Once | INTRAVENOUS | Status: AC | PRN
  Administered 2021-12-31: 500 [IU]

## 2021-12-31 MED ORDER — MAGNESIUM SULFATE 2 GM/50ML IV SOLN
2.0000 g | Freq: Once | INTRAVENOUS | Status: AC
  Administered 2021-12-31: 2 g via INTRAVENOUS
  Filled 2021-12-31: qty 50

## 2021-12-31 MED ORDER — SODIUM CHLORIDE 0.9 % IV SOLN
50.0000 mg | Freq: Once | INTRAVENOUS | Status: AC
  Administered 2021-12-31: 50 mg via INTRAVENOUS
  Filled 2021-12-31: qty 50

## 2021-12-31 MED ORDER — SODIUM CHLORIDE 0.9 % IV SOLN
1000.0000 mg/m2 | Freq: Once | INTRAVENOUS | Status: AC
  Administered 2021-12-31: 2090 mg via INTRAVENOUS
  Filled 2021-12-31: qty 52.6

## 2021-12-31 MED ORDER — POTASSIUM CHLORIDE IN NACL 20-0.9 MEQ/L-% IV SOLN
Freq: Once | INTRAVENOUS | Status: AC
  Filled 2021-12-31: qty 1000

## 2021-12-31 MED ORDER — SODIUM CHLORIDE 0.9% FLUSH
10.0000 mL | INTRAVENOUS | Status: DC | PRN
  Administered 2021-12-31: 10 mL

## 2021-12-31 NOTE — Patient Instructions (Signed)
[facility]   Discharge Instructions: Thank you for choosing [facility] to provide your oncology and hematology care.   If you have a lab appointment with the [facility], please go directly to the [facility] and check in at the registration area.   Wear comfortable clothing and clothing appropriate for easy access to any Portacath or PICC line.   We strive to give you quality time with your provider. You may need to reschedule your appointment if you arrive late (15 or more minutes).  Arriving late affects you and other patients whose appointments are after yours.  Also, if you miss three or more appointments without notifying the office, you may be dismissed from the clinic at the provider's discretion.      For prescription refill requests, have your pharmacy contact our office and allow 72 hours for refills to be completed.    Today you received the following chemotherapy and/or immunotherapy agents Gemzar, Cisplatin.      To help prevent nausea and vomiting after your treatment, we encourage you to take your nausea medication as directed.  BELOW ARE SYMPTOMS THAT SHOULD BE REPORTED IMMEDIATELY: *FEVER GREATER THAN 100.4 F (38 C) OR HIGHER *CHILLS OR SWEATING *NAUSEA AND VOMITING THAT IS NOT CONTROLLED WITH YOUR NAUSEA MEDICATION *UNUSUAL SHORTNESS OF BREATH *UNUSUAL BRUISING OR BLEEDING *URINARY PROBLEMS (pain or burning when urinating, or frequent urination) *BOWEL PROBLEMS (unusual diarrhea, constipation, pain near the anus) TENDERNESS IN MOUTH AND THROAT WITH OR WITHOUT PRESENCE OF ULCERS (sore throat, sores in mouth, or a toothache) UNUSUAL RASH, SWELLING OR PAIN  UNUSUAL VAGINAL DISCHARGE OR ITCHING   Items with * indicate a potential emergency and should be followed up as soon as possible or go to the Emergency Department if any problems should occur.  Please show the CHEMOTHERAPY ALERT CARD or IMMUNOTHERAPY ALERT CARD at  check-in to the Emergency Department and triage nurse.  Should you have questions after your visit or need to cancel or reschedule your appointment, please contact [facility]  Dept: [phone]  and follow the prompts.  Office hours are 8:00 a.m. to 4:30 p.m. Monday - Friday. Please note that voicemails left after 4:00 p.m. may not be returned until the following business day.  We are closed weekends and major holidays. You have access to a nurse at all times for urgent questions. Please call the main number to the clinic Dept: [phone] and follow the prompts.   For any non-urgent questions, you may also contact your provider using MyChart. We now offer e-Visits for anyone 85 and older to request care online for non-urgent symptoms. For details visit [email or web address].   Also download the MyChart app! Go to the app store, search "MyChart", open the app, select [facility], and log in with your MyChart username and password.  Masks are optional in the cancer centers. If you would like for your care team to wear a mask while they are taking care of you, please let them know. You may have one support person who is at least 57 years old accompany you for your appointments.  Gemcitabine Injection What is this medication? GEMCITABINE (jem SYE ta been) treats some types of cancer. It works by slowing down the growth of cancer cells. This medicine may be used for other purposes; ask your health care provider or pharmacist if you have questions. COMMON BRAND NAME(S): Gemzar, Infugem What should I tell my care team before I  take this medication? They need to know if you have any of these conditions: Blood disorders Infection Kidney disease Liver disease Lung or breathing disease, such as asthma or COPD Recent or ongoing radiation therapy An unusual or allergic reaction to gemcitabine, other medications, foods, dyes, or preservatives If you or your partner are  pregnant or trying to get pregnant Breast-feeding How should I use this medication? This medication is injected into a vein. It is given by your care team in a hospital or clinic setting. Talk to your care team about the use of this medication in children. Special care may be needed. Overdosage: If you think you have taken too much of this medicine contact a poison control center or emergency room at once. NOTE: This medicine is only for you. Do not share this medicine with others. What if I miss a dose? Keep appointments for follow-up doses. It is important not to miss your dose. Call your care team if you are unable to keep an appointment. What may interact with this medication? Interactions have not been studied. This list may not describe all possible interactions. Give your health care provider a list of all the medicines, herbs, non-prescription drugs, or dietary supplements you use. Also tell them if you smoke, drink alcohol, or use illegal drugs. Some items may interact with your medicine. What should I watch for while using this medication? Your condition will be monitored carefully while you are receiving this medication. This medication may make you feel generally unwell. This is not uncommon, as chemotherapy can affect healthy cells as well as cancer cells. Report any side effects. Continue your course of treatment even though you feel ill unless your care team tells you to stop. In some cases, you may be given additional medications to help with side effects. Follow all directions for their use. This medication may increase your risk of getting an infection. Call your care team for advice if you get a fever, chills, sore throat, or other symptoms of a cold or flu. Do not treat yourself. Try to avoid being around people who are sick. This medication may increase your risk to bruise or bleed. Call your care team if you notice any unusual bleeding. Be careful brushing or flossing your  teeth or using a toothpick because you may get an infection or bleed more easily. If you have any dental work done, tell your dentist you are receiving this medication. Avoid taking medications that contain aspirin, acetaminophen, ibuprofen, naproxen, or ketoprofen unless instructed by your care team. These medications may hide a fever. Talk to your care team if you or your partner wish to become pregnant or think you might be pregnant. This medication can cause serious birth defects if taken during pregnancy and for 6 months after the last dose. A negative pregnancy test is required before starting this medication. A reliable form of contraception is recommended while taking this medication and for 6 months after the last dose. Talk to your care team about effective forms of contraception. Do not father a child while taking this medication and for 3 months after the last dose. Use a condom while having sex during this time period. Do not breastfeed while taking this medication and for at least 1 week after the last dose. This medication may cause infertility. Talk to your care team if you are concerned about your fertility. What side effects may I notice from receiving this medication? Side effects that you should report to your care team  as soon as possible: Allergic reactions--skin rash, itching, hives, swelling of the face, lips, tongue, or throat Capillary leak syndrome--stomach or muscle pain, unusual weakness or fatigue, feeling faint or lightheaded, decrease in the amount of urine, swelling of the ankles, hands, or feet, trouble breathing Infection--fever, chills, cough, sore throat, wounds that don't heal, pain or trouble when passing urine, general feeling of discomfort or being unwell Liver injury--right upper belly pain, loss of appetite, nausea, light-colored stool, dark yellow or brown urine, yellowing skin or eyes, unusual weakness or fatigue Low red blood cell level--unusual weakness or  fatigue, dizziness, headache, trouble breathing Lung injury--shortness of breath or trouble breathing, cough, spitting up blood, chest pain, fever Stomach pain, bloody diarrhea, pale skin, unusual weakness or fatigue, decrease in the amount of urine, which may be signs of hemolytic uremic syndrome Sudden and severe headache, confusion, change in vision, seizures, which may be signs of posterior reversible encephalopathy syndrome (PRES) Unusual bruising or bleeding Side effects that usually do not require medical attention (report to your care team if they continue or are bothersome): Diarrhea Drowsiness Hair loss Nausea Pain, redness, or swelling with sores inside the mouth or throat Vomiting This list may not describe all possible side effects. Call your doctor for medical advice about side effects. You may report side effects to FDA at 1-800-FDA-1088. Where should I keep my medication? This medication is given in a hospital or clinic. It will not be stored at home. NOTE: This sheet is a summary. It may not cover all possible information. If you have questions about this medicine, talk to your doctor, pharmacist, or health care provider.  2023 Elsevier/Gold Standard (2021-06-16 00:00:00)  Cisplatin Injection What is this medication? CISPLATIN (SIS pla tin) treats some types of cancer. It works by slowing down the growth of cancer cells. This medicine may be used for other purposes; ask your health care provider or pharmacist if you have questions. COMMON BRAND NAME(S): Platinol, Platinol -AQ What should I tell my care team before I take this medication? They need to know if you have any of these conditions: Eye disease, vision problems Hearing problems Kidney disease Low blood counts, such as low white cells, platelets, or red blood cells Tingling of the fingers or toes, or other nerve disorder An unusual or allergic reaction to cisplatin, carboplatin, oxaliplatin, other medications,  foods, dyes, or preservatives If you or your partner are pregnant or trying to get pregnant Breast-feeding How should I use this medication? This medication is injected into a vein. It is given by your care team in a hospital or clinic setting. Talk to your care team about the use of this medication in children. Special care may be needed. Overdosage: If you think you have taken too much of this medicine contact a poison control center or emergency room at once. NOTE: This medicine is only for you. Do not share this medicine with others. What if I miss a dose? Keep appointments for follow-up doses. It is important not to miss your dose. Call your care team if you are unable to keep an appointment. What may interact with this medication? Do not take this medication with any of the following: Live virus vaccines This medication may also interact with the following: Certain antibiotics, such as amikacin, gentamicin, neomycin, polymyxin B, streptomycin, tobramycin, vancomycin Foscarnet This list may not describe all possible interactions. Give your health care provider a list of all the medicines, herbs, non-prescription drugs, or dietary supplements you use. Also  tell them if you smoke, drink alcohol, or use illegal drugs. Some items may interact with your medicine. What should I watch for while using this medication? Your condition will be monitored carefully while you are receiving this medication. You may need blood work done while taking this medication. This medication may make you feel generally unwell. This is not uncommon, as chemotherapy can affect healthy cells as well as cancer cells. Report any side effects. Continue your course of treatment even though you feel ill unless your care team tells you to stop. This medication may increase your risk of getting an infection. Call your care team for advice if you get a fever, chills, sore throat, or other symptoms of a cold or flu. Do not treat  yourself. Try to avoid being around people who are sick. Avoid taking medications that contain aspirin, acetaminophen, ibuprofen, naproxen, or ketoprofen unless instructed by your care team. These medications may hide a fever. This medication may increase your risk to bruise or bleed. Call your care team if you notice any unusual bleeding. Be careful brushing or flossing your teeth or using a toothpick because you may get an infection or bleed more easily. If you have any dental work done, tell your dentist you are receiving this medication. Drink fluids as directed while you are taking this medication. This will help protect your kidneys. Call your care team if you get diarrhea. Do not treat yourself. Talk to your care team if you or your partner wish to become pregnant or think you might be pregnant. This medication can cause serious birth defects if taken during pregnancy and for 14 months after the last dose. A negative pregnancy test is required before starting this medication. A reliable form of contraception is recommended while taking this medication and for 14 months after the last dose. Talk to your care team about effective forms of contraception. Do not father a child while taking this medication and for 11 months after the last dose. Use a condom during sex during this time period. Do not breast-feed while taking this medication. This medication may cause infertility. Talk to your care team if you are concerned about your fertility. What side effects may I notice from receiving this medication? Side effects that you should report to your care team as soon as possible: Allergic reactions--skin rash, itching, hives, swelling of the face, lips, tongue, or throat Eye pain, change in vision, vision loss Hearing loss, ringing in ears Infection--fever, chills, cough, sore throat, wounds that don't heal, pain or trouble when passing urine, general feeling of discomfort or being unwell Kidney  injury--decrease in the amount of urine, swelling of the ankles, hands, or feet Low red blood cell level--unusual weakness or fatigue, dizziness, headache, trouble breathing Painful swelling, warmth, or redness of the skin, blisters or sores at the infusion site Pain, tingling, or numbness in the hands or feet Unusual bruising or bleeding Side effects that usually do not require medical attention (report to your care team if they continue or are bothersome): Hair loss Nausea Vomiting This list may not describe all possible side effects. Call your doctor for medical advice about side effects. You may report side effects to FDA at 1-800-FDA-1088. Where should I keep my medication? This medication is given in a hospital or clinic. It will not be stored at home. NOTE: This sheet is a summary. It may not cover all possible information. If you have questions about this medicine, talk to your doctor, pharmacist, or  health care provider.  2023 Elsevier/Gold Standard (2021-06-14 00:00:00)

## 2022-01-04 ENCOUNTER — Other Ambulatory Visit: Payer: Self-pay

## 2022-01-09 ENCOUNTER — Other Ambulatory Visit: Payer: Self-pay | Admitting: Oncology

## 2022-01-09 DIAGNOSIS — C221 Intrahepatic bile duct carcinoma: Secondary | ICD-10-CM

## 2022-01-13 ENCOUNTER — Inpatient Hospital Stay: Payer: Managed Care, Other (non HMO) | Admitting: Nurse Practitioner

## 2022-01-13 ENCOUNTER — Encounter: Payer: Self-pay | Admitting: Nurse Practitioner

## 2022-01-13 ENCOUNTER — Inpatient Hospital Stay: Payer: Managed Care, Other (non HMO)

## 2022-01-13 DIAGNOSIS — C221 Intrahepatic bile duct carcinoma: Secondary | ICD-10-CM | POA: Diagnosis not present

## 2022-01-13 DIAGNOSIS — Z5112 Encounter for antineoplastic immunotherapy: Secondary | ICD-10-CM | POA: Diagnosis not present

## 2022-01-13 LAB — CBC WITH DIFFERENTIAL (CANCER CENTER ONLY)
Abs Immature Granulocytes: 0.05 10*3/uL (ref 0.00–0.07)
Basophils Absolute: 0 10*3/uL (ref 0.0–0.1)
Basophils Relative: 0 %
Eosinophils Absolute: 0.2 10*3/uL (ref 0.0–0.5)
Eosinophils Relative: 3 %
HCT: 30.5 % — ABNORMAL LOW (ref 39.0–52.0)
Hemoglobin: 9.8 g/dL — ABNORMAL LOW (ref 13.0–17.0)
Immature Granulocytes: 1 %
Lymphocytes Relative: 27 %
Lymphs Abs: 1.7 10*3/uL (ref 0.7–4.0)
MCH: 30.2 pg (ref 26.0–34.0)
MCHC: 32.1 g/dL (ref 30.0–36.0)
MCV: 93.8 fL (ref 80.0–100.0)
Monocytes Absolute: 0.6 10*3/uL (ref 0.1–1.0)
Monocytes Relative: 10 %
Neutro Abs: 3.6 10*3/uL (ref 1.7–7.7)
Neutrophils Relative %: 59 %
Platelet Count: 210 10*3/uL (ref 150–400)
RBC: 3.25 MIL/uL — ABNORMAL LOW (ref 4.22–5.81)
RDW: 17.7 % — ABNORMAL HIGH (ref 11.5–15.5)
WBC Count: 6.2 10*3/uL (ref 4.0–10.5)
nRBC: 0 % (ref 0.0–0.2)

## 2022-01-13 LAB — CMP (CANCER CENTER ONLY)
ALT: 13 U/L (ref 0–44)
AST: 17 U/L (ref 15–41)
Albumin: 3.9 g/dL (ref 3.5–5.0)
Alkaline Phosphatase: 173 U/L — ABNORMAL HIGH (ref 38–126)
Anion gap: 9 (ref 5–15)
BUN: 17 mg/dL (ref 6–20)
CO2: 26 mmol/L (ref 22–32)
Calcium: 9.2 mg/dL (ref 8.9–10.3)
Chloride: 103 mmol/L (ref 98–111)
Creatinine: 0.88 mg/dL (ref 0.61–1.24)
GFR, Estimated: >60 mL/min (ref >60)
Glucose, Bld: 113 mg/dL — ABNORMAL HIGH (ref 70–99)
Potassium: 4.1 mmol/L (ref 3.5–5.1)
Sodium: 138 mmol/L (ref 135–145)
Total Bilirubin: 0.4 mg/dL (ref 0.3–1.2)
Total Protein: 6.6 g/dL (ref 6.5–8.1)

## 2022-01-13 LAB — SAMPLE TO BLOOD BANK

## 2022-01-13 LAB — TSH: TSH: 2.723 u[IU]/mL (ref 0.350–4.500)

## 2022-01-13 LAB — MAGNESIUM: Magnesium: 1.8 mg/dL (ref 1.7–2.4)

## 2022-01-13 MED ORDER — SODIUM CHLORIDE 0.9% FLUSH
10.0000 mL | Freq: Once | INTRAVENOUS | Status: AC
  Administered 2022-01-13: 10 mL via INTRAVENOUS

## 2022-01-13 MED ORDER — HEPARIN SOD (PORK) LOCK FLUSH 100 UNIT/ML IV SOLN
500.0000 [IU] | Freq: Once | INTRAVENOUS | Status: AC
  Administered 2022-01-13: 500 [IU] via INTRAVENOUS

## 2022-01-13 NOTE — Progress Notes (Signed)
[facility] OFFICE PROGRESS NOTE   Diagnosis: Cholangiocarcinoma  INTERVAL HISTORY:   Mr. Vernet returns as scheduled.  He completed cycle 5 gemcitabine/cisplatin/durvalumab beginning 12/24/2021.  He saw Dr. Fayrene Helper at [facility] 12/28/2021.  Recommendation is to complete 6 cycles of therapy prior to surgery.  He had mild nausea.  No vomiting.  No mouth sores.  No diarrhea.  Some jaw pain.  Neuropathy symptoms are better.  He notes fatigue following each treatment.  Pruritus at the upper back.  Objective:  Vital signs in last 24 hours:  Blood pressure (!) 148/84, pulse 67, temperature 98.2 F (36.8 C), temperature source Oral, resp. rate 18, height '6\' 2"'$  (1.88 m), weight 203 lb 12.8 oz (92.4 kg), SpO2 100 %.    HEENT: No thrush or ulcers. Resp: Lungs clear bilaterally. Cardio: Regular rate and rhythm. GI: Abdomen soft and nontender.  No hepatomegaly.  Right upper quadrant biliary drain site without erythema. Vascular: No leg edema. Skin: Mild acne type rash at the upper back. Port-A-Cath without erythema.  Lab Results:  Lab Results  Component Value Date   WBC 6.2 01/13/2022   HGB 9.8 (L) 01/13/2022   HCT 30.5 (L) 01/13/2022   MCV 93.8 01/13/2022   PLT 210 01/13/2022   NEUTROABS 3.6 01/13/2022    Imaging:  No results found.  Medications: I have reviewed the patient's current medications.  Assessment/Plan: Liver mass concerning for malignancy -CT abdomen/pelvis with contrast 08/14/1998 23-2.9 x 2.7 x 2.7 cm hypoenhancing mass in segment 5 near the liver hilum just above hepatic ductal confluence suspicious for primary bile duct malignancy or primary hepatic malignancy -MRCP 08/14/2021-irregular mass of segment 8 of the liver with perilesional enhancement concerning for cholangiocarcinoma -08/14/2021 CA 19.9 was elevated at 76 -08/16/2021 AFP 6.4 -08/16/2021 cytology from bile duct brushing showed atypical cells -08/19/2021 cytology from bile duct brushing showed  cells suspicious for malignancy -08/25/2021 CT-guided liver biopsy-moderately differentiated adenocarcinoma, CK7 positive, CDX2 positive in a small population of cells, TTF-1 negative, CK20 negative-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 -CTs at [facility] 09/06/2021-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, occlusion of the anterior branch of the right portal vein, periportal enhancement and thickening on delayed imaging potentially due to tumor infiltration, no change in the 2 x 3.2 cm mass in segment 8, mild to moderate left and right intrahepatic biliary dilatation, biliary stent in place, small portacaval and periportal nodes, no evidence of metastatic disease in the chest -Cycle 1 gemcitabine/cisplatin/durvalumab 10/01/2021 -Cycle 2 gemcitabine/cisplatin/durvalumab 10/22/2021 -Cycle 3 gemcitabine/cisplatin/durvalumab 11/11/2021, D1 cisplatin held due to neuropathy, cisplatin resumed day 8 -Cycle 4 gemcitabine/cisplatin/durvalumab 12/03/2021 -MRI abdomen 12/15/2021-segment 8 cholangiocarcinoma no longer measurable, residual biliary duct dilation in the anterior right and left hepatic lobes, no evidence of metastatic disease persistent anterior right and new peripheral left portal vein occlusion -Cycle 5 gemcitabine/cisplatin/durvalumab 12/24/2021 -Cycle 6 gemcitabine/cisplatin/durvalumab 01/13/2022 2.  Obstructive jaundice -08/16/2021 percutaneous biliary drain placement 3.  Hepatic steatosis 4.  GERD 5.  Hypertension 6.  Normocytic anemia 7.  Enlarged prostate seen on CT 8.  Tobacco use 9.  Enterococcus and Aerococcus bacteremia 08/20/2021-discharged 08/26/2021 to complete outpatient course of Augmentin 10.  Peripheral neuropathy 11.  Port-A-Cath placement 09/23/2021 12.  Biliary drain exchange 09/23/2021; drain capped 10/12/2021 13.  Rigors following biliary drain procedure 09/23/2021, biliary drain culture-Enterobacter Cloacae, Bactrim  x7 days 09/27/2021 14.  Right leg edema and pain 10/18/2021-Doppler negative for DVT      Disposition: Mr. Shaddock appears stable.  He  has completed 5 cycles of gemcitabine/cisplatin/durvalumab.  The plan is to complete 6 cycles prior to surgery with Dr. Fayrene Helper at [facility].  He will return to begin cycle 6 01/14/2022.  He continues to tolerate treatment well.  CBC and chemistry panel reviewed.  Labs adequate to proceed as above.  He will return for a follow-up appointment 02/17/2022.  We are available to see him sooner if needed.   Ned Card ANP/GNP-BC   01/13/2022  9:57 AM

## 2022-01-13 NOTE — Progress Notes (Signed)
Patient seen by Ned Card NP today  Vitals are within treatment parameters.  Labs reviewed by Ned Card NP and are within treatment parameters.  Per physician team, patient is ready for treatment and there are NO modifications to the treatment plan. Treatment will be 01/14/2022

## 2022-01-14 ENCOUNTER — Inpatient Hospital Stay: Payer: Managed Care, Other (non HMO)

## 2022-01-14 VITALS — BP 125/80 | HR 71 | Temp 98.1°F | Resp 20

## 2022-01-14 DIAGNOSIS — C221 Intrahepatic bile duct carcinoma: Secondary | ICD-10-CM

## 2022-01-14 DIAGNOSIS — Z5112 Encounter for antineoplastic immunotherapy: Secondary | ICD-10-CM | POA: Diagnosis not present

## 2022-01-14 LAB — T4: T4, Total: 7.7 ug/dL (ref 4.5–12.0)

## 2022-01-14 MED ORDER — SODIUM CHLORIDE 0.9% FLUSH
10.0000 mL | INTRAVENOUS | Status: DC | PRN
  Administered 2022-01-14: 10 mL

## 2022-01-14 MED ORDER — SODIUM CHLORIDE 0.9 % IV SOLN
2000.0000 mg | Freq: Once | INTRAVENOUS | Status: AC
  Administered 2022-01-14: 2000 mg via INTRAVENOUS
  Filled 2022-01-14: qty 52.6

## 2022-01-14 MED ORDER — MAGNESIUM SULFATE 2 GM/50ML IV SOLN
2.0000 g | Freq: Once | INTRAVENOUS | Status: AC
  Administered 2022-01-14: 2 g via INTRAVENOUS
  Filled 2022-01-14: qty 50

## 2022-01-14 MED ORDER — SODIUM CHLORIDE 0.9 % IV SOLN: Freq: Once | INTRAVENOUS | Status: AC

## 2022-01-14 MED ORDER — POTASSIUM CHLORIDE IN NACL 20-0.9 MEQ/L-% IV SOLN
Freq: Once | INTRAVENOUS | Status: AC
  Filled 2022-01-14: qty 1000

## 2022-01-14 MED ORDER — SODIUM CHLORIDE 0.9 % IV SOLN
24.0000 mg/m2 | Freq: Once | INTRAVENOUS | Status: AC
  Administered 2022-01-14: 50 mg via INTRAVENOUS
  Filled 2022-01-14: qty 50

## 2022-01-14 MED ORDER — HEPARIN SOD (PORK) LOCK FLUSH 100 UNIT/ML IV SOLN
500.0000 [IU] | Freq: Once | INTRAVENOUS | Status: AC | PRN
  Administered 2022-01-14: 500 [IU]

## 2022-01-14 MED ORDER — SODIUM CHLORIDE 0.9 % IV SOLN
1500.0000 mg | Freq: Once | INTRAVENOUS | Status: AC
  Administered 2022-01-14: 1500 mg via INTRAVENOUS
  Filled 2022-01-14: qty 30

## 2022-01-14 MED ORDER — PALONOSETRON HCL INJECTION 0.25 MG/5ML
0.2500 mg | Freq: Once | INTRAVENOUS | Status: AC
  Administered 2022-01-14: 0.25 mg via INTRAVENOUS
  Filled 2022-01-14: qty 5

## 2022-01-14 MED ORDER — SODIUM CHLORIDE 0.9 % IV SOLN
150.0000 mg | Freq: Once | INTRAVENOUS | Status: AC
  Administered 2022-01-14: 150 mg via INTRAVENOUS
  Filled 2022-01-14: qty 150

## 2022-01-14 MED ORDER — SODIUM CHLORIDE 0.9 % IV SOLN
10.0000 mg | Freq: Once | INTRAVENOUS | Status: AC
  Administered 2022-01-14: 10 mg via INTRAVENOUS
  Filled 2022-01-14: qty 10

## 2022-01-14 NOTE — Patient Instructions (Signed)
[facility] DISCHARGE INSTRUCTIONS   Discharge Instructions: Thank you for choosing [facility] to provide your oncology and hematology care.   If you have a lab appointment with the [facility], please go directly to the [facility] and check in at the registration area.   Wear comfortable clothing and clothing appropriate for easy access to any Portacath or PICC line.   We strive to give you quality time with your provider. You may need to reschedule your appointment if you arrive late (15 or more minutes).  Arriving late affects you and other patients whose appointments are after yours.  Also, if you miss three or more appointments without notifying the office, you may be dismissed from the clinic at the provider's discretion.      For prescription refill requests, have your pharmacy contact our office and allow 72 hours for refills to be completed.    Today you received the following chemotherapy and/or immunotherapy agents Imfinzi, Gemzar, Cisplatin.      To help prevent nausea and vomiting after your treatment, we encourage you to take your nausea medication as directed.  BELOW ARE SYMPTOMS THAT SHOULD BE REPORTED IMMEDIATELY: *FEVER GREATER THAN 100.4 F (38 C) OR HIGHER *CHILLS OR SWEATING *NAUSEA AND VOMITING THAT IS NOT CONTROLLED WITH YOUR NAUSEA MEDICATION *UNUSUAL SHORTNESS OF BREATH *UNUSUAL BRUISING OR BLEEDING *URINARY PROBLEMS (pain or burning when urinating, or frequent urination) *BOWEL PROBLEMS (unusual diarrhea, constipation, pain near the anus) TENDERNESS IN MOUTH AND THROAT WITH OR WITHOUT PRESENCE OF ULCERS (sore throat, sores in mouth, or a toothache) UNUSUAL RASH, SWELLING OR PAIN  UNUSUAL VAGINAL DISCHARGE OR ITCHING   Items with * indicate a potential emergency and should be followed up as soon as possible or go to the Emergency Department if any problems should occur.  Please show the CHEMOTHERAPY ALERT CARD or IMMUNOTHERAPY ALERT CARD at  check-in to the Emergency Department and triage nurse.  Should you have questions after your visit or need to cancel or reschedule your appointment, please contact [facility]  Dept: [phone]  and follow the prompts.  Office hours are 8:00 a.m. to 4:30 p.m. Monday - Friday. Please note that voicemails left after 4:00 p.m. may not be returned until the following business day.  We are closed weekends and major holidays. You have access to a nurse at all times for urgent questions. Please call the main number to the clinic Dept: [phone] and follow the prompts.   For any non-urgent questions, you may also contact your provider using MyChart. We now offer e-Visits for anyone 32 and older to request care online for non-urgent symptoms. For details visit [email or web address].   Also download the MyChart app! Go to the app store, search "MyChart", open the app, select [facility], and log in with your MyChart username and password.  Masks are optional in the cancer centers. If you would like for your care team to wear a mask while they are taking care of you, please let them know. You may have one support person who is at least 57 years old accompany you for your appointments.  Durvalumab Injection What is this medication? DURVALUMAB (dur VAL ue mab) treats some types of cancer. It works by helping your immune system slow or stop the spread of cancer cells. It is a monoclonal antibody. This medicine may be used for other purposes; ask your health care provider or pharmacist if you have questions. COMMON BRAND NAME(S): IMFINZI What should  I tell my care team before I take this medication? They need to know if you have any of these conditions: Allogeneic stem cell transplant (uses someone else's stem cells) Autoimmune diseases, such as Crohn disease, ulcerative colitis, lupus History of chest radiation Nervous system problems, such as Guillain-Barre syndrome,  myasthenia gravis Organ transplant An unusual or allergic reaction to durvalumab, other medications, foods, dyes, or preservatives Pregnant or trying to get pregnant Breast-feeding How should I use this medication? This medication is infused into a vein. It is given by your care team in a hospital or clinic setting. A special MedGuide will be given to you before each treatment. Be sure to read this information carefully each time. Talk to your care team about the use of this medication in children. Special care may be needed. Overdosage: If you think you have taken too much of this medicine contact a poison control center or emergency room at once. NOTE: This medicine is only for you. Do not share this medicine with others. What if I miss a dose? Keep appointments for follow-up doses. It is important not to miss your dose. Call your care team if you are unable to keep an appointment. What may interact with this medication? Interactions have not been studied. This list may not describe all possible interactions. Give your health care provider a list of all the medicines, herbs, non-prescription drugs, or dietary supplements you use. Also tell them if you smoke, drink alcohol, or use illegal drugs. Some items may interact with your medicine. What should I watch for while using this medication? Your condition will be monitored carefully while you are receiving this medication. You may need blood work while taking this medication. This medication may cause serious skin reactions. They can happen weeks to months after starting the medication. Contact your care team right away if you notice fevers or flu-like symptoms with a rash. The rash may be red or purple and then turn into blisters or peeling of the skin. You may also notice a red rash with swelling of the face, lips, or lymph nodes in your neck or under your arms. Tell your care team right away if you have any change in your eyesight. Talk to  your care team if you may be pregnant. Serious birth defects can occur if you take this medication during pregnancy and for 3 months after the last dose. You will need a negative pregnancy test before starting this medication. Contraception is recommended while taking this medication and for 3 months after the last dose. Your care team can help you find the option that works for you. Do not breastfeed while taking this medication and for 3 months after the last dose. What side effects may I notice from receiving this medication? Side effects that you should report to your care team as soon as possible: Allergic reactions--skin rash, itching, hives, swelling of the face, lips, tongue, or throat Dry cough, shortness of breath or trouble breathing Eye pain, redness, irritation, or discharge with blurry or decreased vision Heart muscle inflammation--unusual weakness or fatigue, shortness of breath, chest pain, fast or irregular heartbeat, dizziness, swelling of the ankles, feet, or hands Hormone gland problems--headache, sensitivity to light, unusual weakness or fatigue, dizziness, fast or irregular heartbeat, increased sensitivity to cold or heat, excessive sweating, constipation, hair loss, increased thirst or amount of urine, tremors or shaking, irritability Infusion reactions--chest pain, shortness of breath or trouble breathing, feeling faint or lightheaded Kidney injury (glomerulonephritis)--decrease in the amount of  urine, red or dark brown urine, foamy or bubbly urine, swelling of the ankles, hands, or feet Liver injury--right upper belly pain, loss of appetite, nausea, light-colored stool, dark yellow or brown urine, yellowing skin or eyes, unusual weakness or fatigue Pain, tingling, or numbness in the hands or feet, muscle weakness, change in vision, confusion or trouble speaking, loss of balance or coordination, trouble walking, seizures Rash, fever, and swollen lymph nodes Redness, blistering,  peeling, or loosening of the skin, including inside the mouth Sudden or severe stomach pain, bloody diarrhea, fever, nausea, vomiting Side effects that usually do not require medical attention (report these to your care team if they continue or are bothersome): Bone, joint, or muscle pain Diarrhea Fatigue Loss of appetite Nausea Skin rash This list may not describe all possible side effects. Call your doctor for medical advice about side effects. You may report side effects to FDA at 1-800-FDA-1088. Where should I keep my medication? This medication is given in a hospital or clinic. It will not be stored at home. NOTE: This sheet is a summary. It may not cover all possible information. If you have questions about this medicine, talk to your doctor, pharmacist, or health care provider.  2023 Elsevier/Gold Standard (2021-06-07 00:00:00)  Gemcitabine Injection What is this medication? GEMCITABINE (jem SYE ta been) treats some types of cancer. It works by slowing down the growth of cancer cells. This medicine may be used for other purposes; ask your health care provider or pharmacist if you have questions. COMMON BRAND NAME(S): Gemzar, Infugem What should I tell my care team before I take this medication? They need to know if you have any of these conditions: Blood disorders Infection Kidney disease Liver disease Lung or breathing disease, such as asthma or COPD Recent or ongoing radiation therapy An unusual or allergic reaction to gemcitabine, other medications, foods, dyes, or preservatives If you or your partner are pregnant or trying to get pregnant Breast-feeding How should I use this medication? This medication is injected into a vein. It is given by your care team in a hospital or clinic setting. Talk to your care team about the use of this medication in children. Special care may be needed. Overdosage: If you think you have taken too much of this medicine contact a poison  control center or emergency room at once. NOTE: This medicine is only for you. Do not share this medicine with others. What if I miss a dose? Keep appointments for follow-up doses. It is important not to miss your dose. Call your care team if you are unable to keep an appointment. What may interact with this medication? Interactions have not been studied. This list may not describe all possible interactions. Give your health care provider a list of all the medicines, herbs, non-prescription drugs, or dietary supplements you use. Also tell them if you smoke, drink alcohol, or use illegal drugs. Some items may interact with your medicine. What should I watch for while using this medication? Your condition will be monitored carefully while you are receiving this medication. This medication may make you feel generally unwell. This is not uncommon, as chemotherapy can affect healthy cells as well as cancer cells. Report any side effects. Continue your course of treatment even though you feel ill unless your care team tells you to stop. In some cases, you may be given additional medications to help with side effects. Follow all directions for their use. This medication may increase your risk of getting an infection.  Call your care team for advice if you get a fever, chills, sore throat, or other symptoms of a cold or flu. Do not treat yourself. Try to avoid being around people who are sick. This medication may increase your risk to bruise or bleed. Call your care team if you notice any unusual bleeding. Be careful brushing or flossing your teeth or using a toothpick because you may get an infection or bleed more easily. If you have any dental work done, tell your dentist you are receiving this medication. Avoid taking medications that contain aspirin, acetaminophen, ibuprofen, naproxen, or ketoprofen unless instructed by your care team. These medications may hide a fever. Talk to your care team if you or  your partner wish to become pregnant or think you might be pregnant. This medication can cause serious birth defects if taken during pregnancy and for 6 months after the last dose. A negative pregnancy test is required before starting this medication. A reliable form of contraception is recommended while taking this medication and for 6 months after the last dose. Talk to your care team about effective forms of contraception. Do not father a child while taking this medication and for 3 months after the last dose. Use a condom while having sex during this time period. Do not breastfeed while taking this medication and for at least 1 week after the last dose. This medication may cause infertility. Talk to your care team if you are concerned about your fertility. What side effects may I notice from receiving this medication? Side effects that you should report to your care team as soon as possible: Allergic reactions--skin rash, itching, hives, swelling of the face, lips, tongue, or throat Capillary leak syndrome--stomach or muscle pain, unusual weakness or fatigue, feeling faint or lightheaded, decrease in the amount of urine, swelling of the ankles, hands, or feet, trouble breathing Infection--fever, chills, cough, sore throat, wounds that don't heal, pain or trouble when passing urine, general feeling of discomfort or being unwell Liver injury--right upper belly pain, loss of appetite, nausea, light-colored stool, dark yellow or brown urine, yellowing skin or eyes, unusual weakness or fatigue Low red blood cell level--unusual weakness or fatigue, dizziness, headache, trouble breathing Lung injury--shortness of breath or trouble breathing, cough, spitting up blood, chest pain, fever Stomach pain, bloody diarrhea, pale skin, unusual weakness or fatigue, decrease in the amount of urine, which may be signs of hemolytic uremic syndrome Sudden and severe headache, confusion, change in vision, seizures, which  may be signs of posterior reversible encephalopathy syndrome (PRES) Unusual bruising or bleeding Side effects that usually do not require medical attention (report to your care team if they continue or are bothersome): Diarrhea Drowsiness Hair loss Nausea Pain, redness, or swelling with sores inside the mouth or throat Vomiting This list may not describe all possible side effects. Call your doctor for medical advice about side effects. You may report side effects to FDA at 1-800-FDA-1088. Where should I keep my medication? This medication is given in a hospital or clinic. It will not be stored at home. NOTE: This sheet is a summary. It may not cover all possible information. If you have questions about this medicine, talk to your doctor, pharmacist, or health care provider.  2023 Elsevier/Gold Standard (2007-04-07 00:00:00)  Cisplatin Injection What is this medication? CISPLATIN (SIS pla tin) treats some types of cancer. It works by slowing down the growth of cancer cells. This medicine may be used for other purposes; ask your health care provider  or pharmacist if you have questions. COMMON BRAND NAME(S): Platinol, Platinol -AQ What should I tell my care team before I take this medication? They need to know if you have any of these conditions: Eye disease, vision problems Hearing problems Kidney disease Low blood counts, such as low white cells, platelets, or red blood cells Tingling of the fingers or toes, or other nerve disorder An unusual or allergic reaction to cisplatin, carboplatin, oxaliplatin, other medications, foods, dyes, or preservatives If you or your partner are pregnant or trying to get pregnant Breast-feeding How should I use this medication? This medication is injected into a vein. It is given by your care team in a hospital or clinic setting. Talk to your care team about the use of this medication in children. Special care may be needed. Overdosage: If you think  you have taken too much of this medicine contact a poison control center or emergency room at once. NOTE: This medicine is only for you. Do not share this medicine with others. What if I miss a dose? Keep appointments for follow-up doses. It is important not to miss your dose. Call your care team if you are unable to keep an appointment. What may interact with this medication? Do not take this medication with any of the following: Live virus vaccines This medication may also interact with the following: Certain antibiotics, such as amikacin, gentamicin, neomycin, polymyxin B, streptomycin, tobramycin, vancomycin Foscarnet This list may not describe all possible interactions. Give your health care provider a list of all the medicines, herbs, non-prescription drugs, or dietary supplements you use. Also tell them if you smoke, drink alcohol, or use illegal drugs. Some items may interact with your medicine. What should I watch for while using this medication? Your condition will be monitored carefully while you are receiving this medication. You may need blood work done while taking this medication. This medication may make you feel generally unwell. This is not uncommon, as chemotherapy can affect healthy cells as well as cancer cells. Report any side effects. Continue your course of treatment even though you feel ill unless your care team tells you to stop. This medication may increase your risk of getting an infection. Call your care team for advice if you get a fever, chills, sore throat, or other symptoms of a cold or flu. Do not treat yourself. Try to avoid being around people who are sick. Avoid taking medications that contain aspirin, acetaminophen, ibuprofen, naproxen, or ketoprofen unless instructed by your care team. These medications may hide a fever. This medication may increase your risk to bruise or bleed. Call your care team if you notice any unusual bleeding. Be careful brushing or  flossing your teeth or using a toothpick because you may get an infection or bleed more easily. If you have any dental work done, tell your dentist you are receiving this medication. Drink fluids as directed while you are taking this medication. This will help protect your kidneys. Call your care team if you get diarrhea. Do not treat yourself. Talk to your care team if you or your partner wish to become pregnant or think you might be pregnant. This medication can cause serious birth defects if taken during pregnancy and for 14 months after the last dose. A negative pregnancy test is required before starting this medication. A reliable form of contraception is recommended while taking this medication and for 14 months after the last dose. Talk to your care team about effective forms of contraception. Do  not father a child while taking this medication and for 11 months after the last dose. Use a condom during sex during this time period. Do not breast-feed while taking this medication. This medication may cause infertility. Talk to your care team if you are concerned about your fertility. What side effects may I notice from receiving this medication? Side effects that you should report to your care team as soon as possible: Allergic reactions--skin rash, itching, hives, swelling of the face, lips, tongue, or throat Eye pain, change in vision, vision loss Hearing loss, ringing in ears Infection--fever, chills, cough, sore throat, wounds that don't heal, pain or trouble when passing urine, general feeling of discomfort or being unwell Kidney injury--decrease in the amount of urine, swelling of the ankles, hands, or feet Low red blood cell level--unusual weakness or fatigue, dizziness, headache, trouble breathing Painful swelling, warmth, or redness of the skin, blisters or sores at the infusion site Pain, tingling, or numbness in the hands or feet Unusual bruising or bleeding Side effects that usually  do not require medical attention (report to your care team if they continue or are bothersome): Hair loss Nausea Vomiting This list may not describe all possible side effects. Call your doctor for medical advice about side effects. You may report side effects to FDA at 1-800-FDA-1088. Where should I keep my medication? This medication is given in a hospital or clinic. It will not be stored at home. NOTE: This sheet is a summary. It may not cover all possible information. If you have questions about this medicine, talk to your doctor, pharmacist, or health care provider.  2023 Elsevier/Gold Standard (2021-06-11 00:00:00)

## 2022-01-15 ENCOUNTER — Other Ambulatory Visit: Payer: Self-pay

## 2022-01-19 ENCOUNTER — Encounter: Payer: Self-pay | Admitting: *Deleted

## 2022-01-19 ENCOUNTER — Other Ambulatory Visit: Payer: Self-pay | Admitting: *Deleted

## 2022-01-19 ENCOUNTER — Other Ambulatory Visit: Payer: Self-pay | Admitting: Oncology

## 2022-01-19 ENCOUNTER — Inpatient Hospital Stay: Payer: Managed Care, Other (non HMO)

## 2022-01-19 VITALS — BP 134/82 | HR 75 | Temp 98.6°F | Resp 18 | Ht 74.0 in | Wt 202.2 lb

## 2022-01-19 DIAGNOSIS — C221 Intrahepatic bile duct carcinoma: Secondary | ICD-10-CM

## 2022-01-19 DIAGNOSIS — Z5112 Encounter for antineoplastic immunotherapy: Secondary | ICD-10-CM | POA: Diagnosis not present

## 2022-01-19 LAB — CBC WITH DIFFERENTIAL (CANCER CENTER ONLY)
Abs Immature Granulocytes: 0.02 10*3/uL (ref 0.00–0.07)
Basophils Absolute: 0 10*3/uL (ref 0.0–0.1)
Basophils Relative: 1 %
Eosinophils Absolute: 0.2 10*3/uL (ref 0.0–0.5)
Eosinophils Relative: 4 %
HCT: 31.2 % — ABNORMAL LOW (ref 39.0–52.0)
Hemoglobin: 10.1 g/dL — ABNORMAL LOW (ref 13.0–17.0)
Immature Granulocytes: 0 %
Lymphocytes Relative: 29 %
Lymphs Abs: 1.3 10*3/uL (ref 0.7–4.0)
MCH: 30.6 pg (ref 26.0–34.0)
MCHC: 32.4 g/dL (ref 30.0–36.0)
MCV: 94.5 fL (ref 80.0–100.0)
Monocytes Absolute: 0.1 10*3/uL (ref 0.1–1.0)
Monocytes Relative: 2 %
Neutro Abs: 2.9 10*3/uL (ref 1.7–7.7)
Neutrophils Relative %: 64 %
Platelet Count: 302 10*3/uL (ref 150–400)
RBC: 3.3 MIL/uL — ABNORMAL LOW (ref 4.22–5.81)
RDW: 16.4 % — ABNORMAL HIGH (ref 11.5–15.5)
WBC Count: 4.5 10*3/uL (ref 4.0–10.5)
nRBC: 0 % (ref 0.0–0.2)

## 2022-01-19 LAB — CMP (CANCER CENTER ONLY)
ALT: 33 U/L (ref 0–44)
AST: 26 U/L (ref 15–41)
Albumin: 4 g/dL (ref 3.5–5.0)
Alkaline Phosphatase: 149 U/L — ABNORMAL HIGH (ref 38–126)
Anion gap: 8 (ref 5–15)
BUN: 15 mg/dL (ref 6–20)
CO2: 25 mmol/L (ref 22–32)
Calcium: 9.4 mg/dL (ref 8.9–10.3)
Chloride: 102 mmol/L (ref 98–111)
Creatinine: 0.84 mg/dL (ref 0.61–1.24)
GFR, Estimated: >60 mL/min (ref >60)
Glucose, Bld: 128 mg/dL — ABNORMAL HIGH (ref 70–99)
Potassium: 4.1 mmol/L (ref 3.5–5.1)
Sodium: 135 mmol/L (ref 135–145)
Total Bilirubin: 0.6 mg/dL (ref 0.3–1.2)
Total Protein: 6.7 g/dL (ref 6.5–8.1)

## 2022-01-19 LAB — MAGNESIUM: Magnesium: 1.7 mg/dL (ref 1.7–2.4)

## 2022-01-19 MED ORDER — SODIUM CHLORIDE 0.9% FLUSH
10.0000 mL | INTRAVENOUS | Status: DC | PRN
  Administered 2022-01-19: 10 mL

## 2022-01-19 MED ORDER — POTASSIUM CHLORIDE IN NACL 20-0.9 MEQ/L-% IV SOLN
Freq: Once | INTRAVENOUS | Status: AC
  Filled 2022-01-19: qty 1000

## 2022-01-19 MED ORDER — SODIUM CHLORIDE 0.9 % IV SOLN
150.0000 mg | Freq: Once | INTRAVENOUS | Status: AC
  Administered 2022-01-19: 150 mg via INTRAVENOUS
  Filled 2022-01-19: qty 5

## 2022-01-19 MED ORDER — SODIUM CHLORIDE 0.9 % IV SOLN
50.0000 mg | Freq: Once | INTRAVENOUS | Status: AC
  Administered 2022-01-19: 50 mg via INTRAVENOUS
  Filled 2022-01-19: qty 50

## 2022-01-19 MED ORDER — SODIUM CHLORIDE 0.9 % IV SOLN: Freq: Once | INTRAVENOUS | Status: AC

## 2022-01-19 MED ORDER — NEULASTA 6 MG/0.6ML ~~LOC~~ SOSY: 6.0000 mg | PREFILLED_SYRINGE | SUBCUTANEOUS | 0 refills | Status: DC

## 2022-01-19 MED ORDER — SODIUM CHLORIDE 0.9 % IV SOLN
10.0000 mg | Freq: Once | INTRAVENOUS | Status: AC
  Administered 2022-01-19: 10 mg via INTRAVENOUS
  Filled 2022-01-19: qty 1

## 2022-01-19 MED ORDER — SODIUM CHLORIDE 0.9 % IV SOLN
2000.0000 mg | Freq: Once | INTRAVENOUS | Status: AC
  Administered 2022-01-19: 2000 mg via INTRAVENOUS
  Filled 2022-01-19: qty 52.6

## 2022-01-19 MED ORDER — PALONOSETRON HCL INJECTION 0.25 MG/5ML
0.2500 mg | Freq: Once | INTRAVENOUS | Status: AC
  Administered 2022-01-19: 0.25 mg via INTRAVENOUS
  Filled 2022-01-19: qty 5

## 2022-01-19 MED ORDER — MAGNESIUM SULFATE 2 GM/50ML IV SOLN
2.0000 g | Freq: Once | INTRAVENOUS | Status: AC
  Administered 2022-01-19: 2 g via INTRAVENOUS
  Filled 2022-01-19: qty 50

## 2022-01-19 MED ORDER — TRAMADOL HCL 50 MG PO TABS: 25.0000 mg | ORAL_TABLET | Freq: Four times a day (QID) | ORAL | 0 refills | Status: DC | PRN

## 2022-01-19 MED ORDER — SODIUM CHLORIDE 0.9 % IV SOLN: Freq: Once | INTRAVENOUS | Status: DC

## 2022-01-19 MED ORDER — HEPARIN SOD (PORK) LOCK FLUSH 100 UNIT/ML IV SOLN
500.0000 [IU] | Freq: Once | INTRAVENOUS | Status: AC | PRN
  Administered 2022-01-19: 500 [IU]

## 2022-01-19 NOTE — Progress Notes (Signed)
Approval received for Nwulasta inj '6mg'$ /0.41m from [employer] 01/19/22-04/21/22.

## 2022-01-19 NOTE — Patient Instructions (Signed)
[facility]   Discharge Instructions: Thank you for choosing [facility] to provide your oncology and hematology care.   If you have a lab appointment with the [facility], please go directly to the [facility] and check in at the registration area.   Wear comfortable clothing and clothing appropriate for easy access to any Portacath or PICC line.   We strive to give you quality time with your provider. You may need to reschedule your appointment if you arrive late (15 or more minutes).  Arriving late affects you and other patients whose appointments are after yours.  Also, if you miss three or more appointments without notifying the office, you may be dismissed from the clinic at the provider's discretion.      For prescription refill requests, have your pharmacy contact our office and allow 72 hours for refills to be completed.    Today you received the following chemotherapy and/or immunotherapy agents Gemzar, Cisplatin.      To help prevent nausea and vomiting after your treatment, we encourage you to take your nausea medication as directed.  BELOW ARE SYMPTOMS THAT SHOULD BE REPORTED IMMEDIATELY: *FEVER GREATER THAN 100.4 F (38 C) OR HIGHER *CHILLS OR SWEATING *NAUSEA AND VOMITING THAT IS NOT CONTROLLED WITH YOUR NAUSEA MEDICATION *UNUSUAL SHORTNESS OF BREATH *UNUSUAL BRUISING OR BLEEDING *URINARY PROBLEMS (pain or burning when urinating, or frequent urination) *BOWEL PROBLEMS (unusual diarrhea, constipation, pain near the anus) TENDERNESS IN MOUTH AND THROAT WITH OR WITHOUT PRESENCE OF ULCERS (sore throat, sores in mouth, or a toothache) UNUSUAL RASH, SWELLING OR PAIN  UNUSUAL VAGINAL DISCHARGE OR ITCHING   Items with * indicate a potential emergency and should be followed up as soon as possible or go to the Emergency Department if any problems should occur.  Please show the CHEMOTHERAPY ALERT CARD or IMMUNOTHERAPY ALERT CARD at  check-in to the Emergency Department and triage nurse.  Should you have questions after your visit or need to cancel or reschedule your appointment, please contact [facility]  Dept: [phone]  and follow the prompts.  Office hours are 8:00 a.m. to 4:30 p.m. Monday - Friday. Please note that voicemails left after 4:00 p.m. may not be returned until the following business day.  We are closed weekends and major holidays. You have access to a nurse at all times for urgent questions. Please call the main number to the clinic Dept: [phone] and follow the prompts.   For any non-urgent questions, you may also contact your provider using MyChart. We now offer e-Visits for anyone 45 and older to request care online for non-urgent symptoms. For details visit [email or web address].   Also download the MyChart app! Go to the app store, search "MyChart", open the app, select [facility], and log in with your MyChart username and password.  Masks are optional in the cancer centers. If you would like for your care team to wear a mask while they are taking care of you, please let them know. You may have one support person who is at least 57 years old accompany you for your appointments.  Gemcitabine Injection What is this medication? GEMCITABINE (jem SYE ta been) treats some types of cancer. It works by slowing down the growth of cancer cells. This medicine may be used for other purposes; ask your health care provider or pharmacist if you have questions. COMMON BRAND NAME(S): Gemzar, Infugem What should I tell my care team before I  take this medication? They need to know if you have any of these conditions: Blood disorders Infection Kidney disease Liver disease Lung or breathing disease, such as asthma or COPD Recent or ongoing radiation therapy An unusual or allergic reaction to gemcitabine, other medications, foods, dyes, or preservatives If you or your partner are  pregnant or trying to get pregnant Breast-feeding How should I use this medication? This medication is injected into a vein. It is given by your care team in a hospital or clinic setting. Talk to your care team about the use of this medication in children. Special care may be needed. Overdosage: If you think you have taken too much of this medicine contact a poison control center or emergency room at once. NOTE: This medicine is only for you. Do not share this medicine with others. What if I miss a dose? Keep appointments for follow-up doses. It is important not to miss your dose. Call your care team if you are unable to keep an appointment. What may interact with this medication? Interactions have not been studied. This list may not describe all possible interactions. Give your health care provider a list of all the medicines, herbs, non-prescription drugs, or dietary supplements you use. Also tell them if you smoke, drink alcohol, or use illegal drugs. Some items may interact with your medicine. What should I watch for while using this medication? Your condition will be monitored carefully while you are receiving this medication. This medication may make you feel generally unwell. This is not uncommon, as chemotherapy can affect healthy cells as well as cancer cells. Report any side effects. Continue your course of treatment even though you feel ill unless your care team tells you to stop. In some cases, you may be given additional medications to help with side effects. Follow all directions for their use. This medication may increase your risk of getting an infection. Call your care team for advice if you get a fever, chills, sore throat, or other symptoms of a cold or flu. Do not treat yourself. Try to avoid being around people who are sick. This medication may increase your risk to bruise or bleed. Call your care team if you notice any unusual bleeding. Be careful brushing or flossing your  teeth or using a toothpick because you may get an infection or bleed more easily. If you have any dental work done, tell your dentist you are receiving this medication. Avoid taking medications that contain aspirin, acetaminophen, ibuprofen, naproxen, or ketoprofen unless instructed by your care team. These medications may hide a fever. Talk to your care team if you or your partner wish to become pregnant or think you might be pregnant. This medication can cause serious birth defects if taken during pregnancy and for 6 months after the last dose. A negative pregnancy test is required before starting this medication. A reliable form of contraception is recommended while taking this medication and for 6 months after the last dose. Talk to your care team about effective forms of contraception. Do not father a child while taking this medication and for 3 months after the last dose. Use a condom while having sex during this time period. Do not breastfeed while taking this medication and for at least 1 week after the last dose. This medication may cause infertility. Talk to your care team if you are concerned about your fertility. What side effects may I notice from receiving this medication? Side effects that you should report to your care team  as soon as possible: Allergic reactions--skin rash, itching, hives, swelling of the face, lips, tongue, or throat Capillary leak syndrome--stomach or muscle pain, unusual weakness or fatigue, feeling faint or lightheaded, decrease in the amount of urine, swelling of the ankles, hands, or feet, trouble breathing Infection--fever, chills, cough, sore throat, wounds that don't heal, pain or trouble when passing urine, general feeling of discomfort or being unwell Liver injury--right upper belly pain, loss of appetite, nausea, light-colored stool, dark yellow or brown urine, yellowing skin or eyes, unusual weakness or fatigue Low red blood cell level--unusual weakness or  fatigue, dizziness, headache, trouble breathing Lung injury--shortness of breath or trouble breathing, cough, spitting up blood, chest pain, fever Stomach pain, bloody diarrhea, pale skin, unusual weakness or fatigue, decrease in the amount of urine, which may be signs of hemolytic uremic syndrome Sudden and severe headache, confusion, change in vision, seizures, which may be signs of posterior reversible encephalopathy syndrome (PRES) Unusual bruising or bleeding Side effects that usually do not require medical attention (report to your care team if they continue or are bothersome): Diarrhea Drowsiness Hair loss Nausea Pain, redness, or swelling with sores inside the mouth or throat Vomiting This list may not describe all possible side effects. Call your doctor for medical advice about side effects. You may report side effects to FDA at 1-800-FDA-1088. Where should I keep my medication? This medication is given in a hospital or clinic. It will not be stored at home. NOTE: This sheet is a summary. It may not cover all possible information. If you have questions about this medicine, talk to your doctor, pharmacist, or health care provider.  2023 Elsevier/Gold Standard (2021-06-16 00:00:00)  Cisplatin Injection What is this medication? CISPLATIN (SIS pla tin) treats some types of cancer. It works by slowing down the growth of cancer cells. This medicine may be used for other purposes; ask your health care provider or pharmacist if you have questions. COMMON BRAND NAME(S): Platinol, Platinol -AQ What should I tell my care team before I take this medication? They need to know if you have any of these conditions: Eye disease, vision problems Hearing problems Kidney disease Low blood counts, such as low white cells, platelets, or red blood cells Tingling of the fingers or toes, or other nerve disorder An unusual or allergic reaction to cisplatin, carboplatin, oxaliplatin, other medications,  foods, dyes, or preservatives If you or your partner are pregnant or trying to get pregnant Breast-feeding How should I use this medication? This medication is injected into a vein. It is given by your care team in a hospital or clinic setting. Talk to your care team about the use of this medication in children. Special care may be needed. Overdosage: If you think you have taken too much of this medicine contact a poison control center or emergency room at once. NOTE: This medicine is only for you. Do not share this medicine with others. What if I miss a dose? Keep appointments for follow-up doses. It is important not to miss your dose. Call your care team if you are unable to keep an appointment. What may interact with this medication? Do not take this medication with any of the following: Live virus vaccines This medication may also interact with the following: Certain antibiotics, such as amikacin, gentamicin, neomycin, polymyxin B, streptomycin, tobramycin, vancomycin Foscarnet This list may not describe all possible interactions. Give your health care provider a list of all the medicines, herbs, non-prescription drugs, or dietary supplements you use. Also  tell them if you smoke, drink alcohol, or use illegal drugs. Some items may interact with your medicine. What should I watch for while using this medication? Your condition will be monitored carefully while you are receiving this medication. You may need blood work done while taking this medication. This medication may make you feel generally unwell. This is not uncommon, as chemotherapy can affect healthy cells as well as cancer cells. Report any side effects. Continue your course of treatment even though you feel ill unless your care team tells you to stop. This medication may increase your risk of getting an infection. Call your care team for advice if you get a fever, chills, sore throat, or other symptoms of a cold or flu. Do not treat  yourself. Try to avoid being around people who are sick. Avoid taking medications that contain aspirin, acetaminophen, ibuprofen, naproxen, or ketoprofen unless instructed by your care team. These medications may hide a fever. This medication may increase your risk to bruise or bleed. Call your care team if you notice any unusual bleeding. Be careful brushing or flossing your teeth or using a toothpick because you may get an infection or bleed more easily. If you have any dental work done, tell your dentist you are receiving this medication. Drink fluids as directed while you are taking this medication. This will help protect your kidneys. Call your care team if you get diarrhea. Do not treat yourself. Talk to your care team if you or your partner wish to become pregnant or think you might be pregnant. This medication can cause serious birth defects if taken during pregnancy and for 14 months after the last dose. A negative pregnancy test is required before starting this medication. A reliable form of contraception is recommended while taking this medication and for 14 months after the last dose. Talk to your care team about effective forms of contraception. Do not father a child while taking this medication and for 11 months after the last dose. Use a condom during sex during this time period. Do not breast-feed while taking this medication. This medication may cause infertility. Talk to your care team if you are concerned about your fertility. What side effects may I notice from receiving this medication? Side effects that you should report to your care team as soon as possible: Allergic reactions--skin rash, itching, hives, swelling of the face, lips, tongue, or throat Eye pain, change in vision, vision loss Hearing loss, ringing in ears Infection--fever, chills, cough, sore throat, wounds that don't heal, pain or trouble when passing urine, general feeling of discomfort or being unwell Kidney  injury--decrease in the amount of urine, swelling of the ankles, hands, or feet Low red blood cell level--unusual weakness or fatigue, dizziness, headache, trouble breathing Painful swelling, warmth, or redness of the skin, blisters or sores at the infusion site Pain, tingling, or numbness in the hands or feet Unusual bruising or bleeding Side effects that usually do not require medical attention (report to your care team if they continue or are bothersome): Hair loss Nausea Vomiting This list may not describe all possible side effects. Call your doctor for medical advice about side effects. You may report side effects to FDA at 1-800-FDA-1088. Where should I keep my medication? This medication is given in a hospital or clinic. It will not be stored at home. NOTE: This sheet is a summary. It may not cover all possible information. If you have questions about this medicine, talk to your doctor, pharmacist, or  health care provider.  2023 Elsevier/Gold Standard (2021-06-14 00:00:00)

## 2022-01-19 NOTE — Patient Instructions (Signed)

## 2022-02-10 ENCOUNTER — Other Ambulatory Visit: Payer: Self-pay | Admitting: Oncology

## 2022-02-14 DIAGNOSIS — Z01818 Encounter for other preprocedural examination: Secondary | ICD-10-CM | POA: Insufficient documentation

## 2022-02-14 DIAGNOSIS — C240 Malignant neoplasm of extrahepatic bile duct: Secondary | ICD-10-CM | POA: Insufficient documentation

## 2022-02-17 ENCOUNTER — Inpatient Hospital Stay: Payer: Managed Care, Other (non HMO) | Attending: Oncology | Admitting: Oncology

## 2022-02-17 VITALS — BP 132/85 | HR 74 | Temp 98.2°F | Resp 18 | Ht 75.0 in | Wt 202.6 lb

## 2022-02-17 DIAGNOSIS — C221 Intrahepatic bile duct carcinoma: Secondary | ICD-10-CM | POA: Diagnosis not present

## 2022-02-17 DIAGNOSIS — D649 Anemia, unspecified: Secondary | ICD-10-CM | POA: Diagnosis not present

## 2022-02-17 DIAGNOSIS — G629 Polyneuropathy, unspecified: Secondary | ICD-10-CM | POA: Insufficient documentation

## 2022-02-17 DIAGNOSIS — I10 Essential (primary) hypertension: Secondary | ICD-10-CM | POA: Insufficient documentation

## 2022-02-17 DIAGNOSIS — Z9221 Personal history of antineoplastic chemotherapy: Secondary | ICD-10-CM | POA: Insufficient documentation

## 2022-02-17 DIAGNOSIS — K219 Gastro-esophageal reflux disease without esophagitis: Secondary | ICD-10-CM | POA: Insufficient documentation

## 2022-02-17 DIAGNOSIS — N400 Enlarged prostate without lower urinary tract symptoms: Secondary | ICD-10-CM | POA: Diagnosis not present

## 2022-02-17 NOTE — Progress Notes (Signed)
[facility] OFFICE PROGRESS NOTE   Diagnosis: Cholangiocarcinoma  INTERVAL HISTORY:   Jeffrey Blevins returns as scheduled.  He feels well.  Neuropathy symptoms have improved.  He has mild discomfort from the biliary drain.  No other complaint. He saw Dr. Fayrene Helper 02/14/2022.  He underwent restaging CTs and an MRI.  There is a possible new lesion in the caudate.  Dr.Lidsky is planning further review of the imaging prior to deciding on proceeding with surgery.  Objective:  Vital signs in last 24 hours:  Blood pressure 132/85, pulse 74, temperature 98.2 F (36.8 C), temperature source Oral, resp. rate 18, height '6\' 3"'$  (1.905 m), weight 202 lb 9.6 oz (91.9 kg), SpO2 100 %.     Lymphatics: No cervical, supraclavicular, axillary, or inguinal nodes Resp: Lungs clear bilaterally Cardio: Regular rate and rhythm GI: No hepatosplenomegaly, nontender, no mass, right upper quadrant biliary drain site without evidence of infection Vascular: No leg edema   Portacath/PICC-without erythema  Lab Results:  Lab Results  Component Value Date   WBC 4.5 01/19/2022   HGB 10.1 (L) 01/19/2022   HCT 31.2 (L) 01/19/2022   MCV 94.5 01/19/2022   PLT 302 01/19/2022   NEUTROABS 2.9 01/19/2022    CMP  Lab Results  Component Value Date   NA 135 01/19/2022   K 4.1 01/19/2022   CL 102 01/19/2022   CO2 25 01/19/2022   GLUCOSE 128 (H) 01/19/2022   BUN 15 01/19/2022   CREATININE 0.84 01/19/2022   CALCIUM 9.4 01/19/2022   PROT 6.7 01/19/2022   ALBUMIN 4.0 01/19/2022   AST 26 01/19/2022   ALT 33 01/19/2022   ALKPHOS 149 (H) 01/19/2022   BILITOT 0.6 01/19/2022   GFRNONAA >60 01/19/2022    Lab Results  Component Value Date   CAN199 159 (H) 09/30/2021   Medications: I have reviewed the patient's current medications.   Assessment/Plan: Liver mass concerning for malignancy -CT abdomen/pelvis with contrast 08/14/1998 23-2.9 x 2.7 x 2.7 cm hypoenhancing mass in segment 5 near the liver  hilum just above hepatic ductal confluence suspicious for primary bile duct malignancy or primary hepatic malignancy -MRCP 08/14/2021-irregular mass of segment 8 of the liver with perilesional enhancement concerning for cholangiocarcinoma -08/14/2021 CA 19.9 was elevated at 76 -08/16/2021 AFP 6.4 -08/16/2021 cytology from bile duct brushing showed atypical cells -08/19/2021 cytology from bile duct brushing showed cells suspicious for malignancy -08/25/2021 CT-guided liver biopsy-moderately differentiated adenocarcinoma, CK7 positive, CDX2 positive in a small population of cells, TTF-1 negative, CK20 negative-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 -CT[facility] at [facility] 09/06/2021-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, occlusion of the anterior branch of the right portal vein, periportal enhancement and thickening on delayed imaging potentially due to tumor infiltration, no change in the 2 x 3.2 cm mass in segment 8, mild to moderate left and right intrahepatic biliary dilatation, biliary stent in place, small portacaval and periportal nodes, no evidence of metastatic disease in the chest -Cycle 1 gemcitabine/cisplatin/durvalumab 10/01/2021 -Cycle 2 gemcitabine/cisplatin/durvalumab 10/22/2021 -Cycle 3 gemcitabine/cisplatin/durvalumab 11/11/2021, D1 cisplatin held due to neuropathy, cisplatin resumed day 8 -Cycle 4 gemcitabine/cisplatin/durvalumab 12/03/2021 -MRI abdomen 12/15/2021-segment 8 cholangiocarcinoma no longer measurable, residual biliary duct dilation in the anterior right and left hepatic lobes, no evidence of metastatic disease persistent anterior right and new peripheral left portal vein occlusion -Cycle 5 gemcitabine/cisplatin/durvalumab 12/24/2021 -Cycle 6 gemcitabine/cisplatin/durvalumab 01/13/2022 -CTs at [facility] 02/14/2022-fine hypoattenuating masses in the left and right hepatic lobes, more prominent compared to the 12/15/2021  MRI,  potentially representing multifocal cholangiocarcinoma, persistent occlusion of left portal and anterior branch of the right portal vein, no extrahepatic lymphadenopathy or metastatic disease -MRI liver 1219 2023-1.3 x 0.8 cm hypoenhancing lesion in segment 8, ill-defined area of delayed enhancement in the caudate measuring 1.7 cm, no left liver lesion, 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  Obstructive jaundice -08/16/2021 percutaneous biliary drain placement 3.  Hepatic steatosis 4.  GERD 5.  Hypertension 6.  Normocytic anemia 7.  Enlarged prostate seen on CT 8.  Tobacco use 9.  Enterococcus and Aerococcus bacteremia 08/20/2021-discharged 08/26/2021 to complete outpatient course of Augmentin 10.  Peripheral neuropathy 11.  Port-A-Cath placement 09/23/2021 12.  Biliary drain exchange 09/23/2021; drain capped 10/12/2021 13.  Rigors following biliary drain procedure 09/23/2021, biliary drain culture-Enterobacter Cloacae, Bactrim x7 days 09/27/2021 14.  Right leg edema and pain 10/18/2021-Doppler negative for DVT       Disposition: Jeffrey Blevins has cholangiocarcinoma.  He completed 6 cycles of gemcitabine/cisplatin/durvalumab.  His clinical status appears stable.  There is no evidence of extrahepatic disease. I discussed the case with Dr. Fayrene Helper yesterday.  He plans to have additional discussions with his colleagues regarding the possibility of a caudate lesion.  He will then decide on the indication for surgery versus additional systemic therapy.  Jeffrey Blevins is currently scheduled for surgery on 03/08/2022.  He will return for an office visit in 2 weeks.  We will contact him if Dr. Fayrene Helper does not recommend surgery.  Betsy Coder, MD  02/17/2022  10:44 AM

## 2022-02-18 ENCOUNTER — Other Ambulatory Visit: Payer: Self-pay

## 2022-03-04 ENCOUNTER — Inpatient Hospital Stay: Payer: Managed Care, Other (non HMO) | Admitting: Nurse Practitioner

## 2022-03-04 ENCOUNTER — Inpatient Hospital Stay: Payer: Managed Care, Other (non HMO)

## 2022-03-07 ENCOUNTER — Other Ambulatory Visit: Payer: Self-pay

## 2022-03-09 ENCOUNTER — Other Ambulatory Visit: Payer: Self-pay

## 2022-03-09 ENCOUNTER — Telehealth: Payer: Self-pay | Admitting: Oncology

## 2022-03-09 NOTE — Telephone Encounter (Signed)
Contacted patient in regards to scheduling follow up, patient advised that he was in the hospital at [facility] due to a surgery advised that he will call us back to schedule. We also set reminder to call back on 1/22 to attempt scheduling.

## 2022-03-11 ENCOUNTER — Other Ambulatory Visit: Payer: Self-pay

## 2022-03-14 ENCOUNTER — Other Ambulatory Visit: Payer: Self-pay

## 2022-03-15 ENCOUNTER — Other Ambulatory Visit: Payer: Self-pay

## 2022-03-17 ENCOUNTER — Other Ambulatory Visit: Payer: Self-pay

## 2022-03-17 ENCOUNTER — Emergency Department (HOSPITAL_BASED_OUTPATIENT_CLINIC_OR_DEPARTMENT_OTHER): Payer: Managed Care, Other (non HMO)

## 2022-03-17 ENCOUNTER — Encounter (HOSPITAL_BASED_OUTPATIENT_CLINIC_OR_DEPARTMENT_OTHER): Payer: Self-pay

## 2022-03-17 ENCOUNTER — Emergency Department (HOSPITAL_BASED_OUTPATIENT_CLINIC_OR_DEPARTMENT_OTHER)
Admission: EM | Admit: 2022-03-17 | Discharge: 2022-03-18 | Disposition: A | Discharge status: Other Acute Inpatient Hospital | Payer: Managed Care, Other (non HMO) | Attending: Emergency Medicine | Admitting: Emergency Medicine

## 2022-03-17 DIAGNOSIS — K651 Peritoneal abscess: Secondary | ICD-10-CM | POA: Insufficient documentation

## 2022-03-17 DIAGNOSIS — R000 Tachycardia, unspecified: Secondary | ICD-10-CM | POA: Insufficient documentation

## 2022-03-17 DIAGNOSIS — Z79899 Other long term (current) drug therapy: Secondary | ICD-10-CM | POA: Insufficient documentation

## 2022-03-17 DIAGNOSIS — E871 Hypo-osmolality and hyponatremia: Secondary | ICD-10-CM | POA: Insufficient documentation

## 2022-03-17 DIAGNOSIS — D72829 Elevated white blood cell count, unspecified: Secondary | ICD-10-CM | POA: Diagnosis not present

## 2022-03-17 DIAGNOSIS — T8149XA Infection following a procedure, other surgical site, initial encounter: Secondary | ICD-10-CM | POA: Diagnosis not present

## 2022-03-17 DIAGNOSIS — R109 Unspecified abdominal pain: Secondary | ICD-10-CM | POA: Diagnosis present

## 2022-03-17 DIAGNOSIS — Z7901 Long term (current) use of anticoagulants: Secondary | ICD-10-CM | POA: Insufficient documentation

## 2022-03-17 DIAGNOSIS — T8143XA Infection following a procedure, organ and space surgical site, initial encounter: Secondary | ICD-10-CM

## 2022-03-17 LAB — LIPASE, BLOOD: Lipase: 18 U/L (ref 11–51)

## 2022-03-17 LAB — COMPREHENSIVE METABOLIC PANEL
ALT: 13 U/L (ref 0–44)
AST: 20 U/L (ref 15–41)
Albumin: 3.2 g/dL — ABNORMAL LOW (ref 3.5–5.0)
Alkaline Phosphatase: 118 U/L (ref 38–126)
Anion gap: 9 (ref 5–15)
BUN: 14 mg/dL (ref 6–20)
CO2: 22 mmol/L (ref 22–32)
Calcium: 8.7 mg/dL — ABNORMAL LOW (ref 8.9–10.3)
Chloride: 96 mmol/L — ABNORMAL LOW (ref 98–111)
Creatinine, Ser: 0.9 mg/dL (ref 0.61–1.24)
GFR, Estimated: >60 mL/min (ref >60)
Glucose, Bld: 140 mg/dL — ABNORMAL HIGH (ref 70–99)
Potassium: 3.9 mmol/L (ref 3.5–5.1)
Sodium: 127 mmol/L — ABNORMAL LOW (ref 135–145)
Total Bilirubin: 1 mg/dL (ref 0.3–1.2)
Total Protein: 6.5 g/dL (ref 6.5–8.1)

## 2022-03-17 LAB — CBC
HCT: 29.5 % — ABNORMAL LOW (ref 39.0–52.0)
Hemoglobin: 9.6 g/dL — ABNORMAL LOW (ref 13.0–17.0)
MCH: 27 pg (ref 26.0–34.0)
MCHC: 32.5 g/dL (ref 30.0–36.0)
MCV: 83.1 fL (ref 80.0–100.0)
Platelets: 349 10*3/uL (ref 150–400)
RBC: 3.55 MIL/uL — ABNORMAL LOW (ref 4.22–5.81)
RDW: 15.9 % — ABNORMAL HIGH (ref 11.5–15.5)
WBC: 20.2 10*3/uL — ABNORMAL HIGH (ref 4.0–10.5)
nRBC: 0 % (ref 0.0–0.2)

## 2022-03-17 LAB — LACTIC ACID, PLASMA: Lactic Acid, Venous: 1.5 mmol/L (ref 0.5–1.9)

## 2022-03-17 MED ORDER — HYDROMORPHONE HCL 1 MG/ML IJ SOLN
0.5000 mg | Freq: Once | INTRAMUSCULAR | Status: AC
  Administered 2022-03-17: 0.5 mg via INTRAVENOUS
  Filled 2022-03-17: qty 1

## 2022-03-17 MED ORDER — SODIUM CHLORIDE 0.9 % IV BOLUS (SEPSIS)
1000.0000 mL | Freq: Once | INTRAVENOUS | Status: AC
  Administered 2022-03-17: 1000 mL via INTRAVENOUS

## 2022-03-17 MED ORDER — HYDROMORPHONE HCL 1 MG/ML IJ SOLN
0.5000 mg | INTRAMUSCULAR | Status: DC | PRN
  Administered 2022-03-18 (×2): 0.5 mg via INTRAVENOUS
  Filled 2022-03-17 (×2): qty 1

## 2022-03-17 MED ORDER — HYDROMORPHONE HCL 1 MG/ML IJ SOLN: 0.5000 mg | INTRAMUSCULAR | Status: DC | PRN

## 2022-03-17 MED ORDER — ONDANSETRON HCL 4 MG/2ML IJ SOLN
4.0000 mg | Freq: Once | INTRAMUSCULAR | Status: AC
  Administered 2022-03-17: 4 mg via INTRAVENOUS
  Filled 2022-03-17: qty 2

## 2022-03-17 MED ORDER — HYDROMORPHONE HCL 1 MG/ML IJ SOLN
1.0000 mg | Freq: Once | INTRAMUSCULAR | Status: AC
  Administered 2022-03-17: 1 mg via INTRAVENOUS
  Filled 2022-03-17: qty 1

## 2022-03-17 MED ORDER — SODIUM CHLORIDE 0.9 % IV SOLN
1000.0000 mL | INTRAVENOUS | Status: DC
  Administered 2022-03-17: 1000 mL via INTRAVENOUS

## 2022-03-17 MED ORDER — PIPERACILLIN-TAZOBACTAM 3.375 G IVPB 30 MIN
3.3750 g | Freq: Once | INTRAVENOUS | Status: AC
  Administered 2022-03-17: 3.375 g via INTRAVENOUS
  Filled 2022-03-17: qty 50

## 2022-03-17 MED ORDER — HYDROMORPHONE HCL 1 MG/ML IJ SOLN: 1.0000 mg | Freq: Once | INTRAMUSCULAR | Status: DC

## 2022-03-17 MED ORDER — IOHEXOL 300 MG/ML  SOLN
100.0000 mL | Freq: Once | INTRAMUSCULAR | Status: AC | PRN
  Administered 2022-03-17: 100 mL via INTRAVENOUS

## 2022-03-17 NOTE — ED Provider Notes (Incomplete)
Patient is a 57 year old male with history of cholangiocarcinoma with recent surgery at [facility] with partial removal of his liver, biliary stent and cholecystectomy who went home 2 days ago and was doing well until today developed severe abdominal pain that has not improved.  The pain is just distal to his surgical incision.  He denies any vomiting but has had some nausea.  He has had no drainage from his site.  Last bowel movement was today.  Lower suspicion for obstruction at this time but concern for postsurgical complication.  Patient has a white count today of 20,000, hyponatremia 127 with normal creatinine and GFR, lipase and lactic acid are within normal limits and LFTs and total bilirubin are within normal limits.  CT shows concern for possible early developing abscess.  Here patient is tachycardic and sats are 92% on room air.  Patient was found to have a consolidation in his right rote lower lobe but radiology felt this is most likely atelectasis.  Patient was given Zosyn.  Will discuss with [facility].   Blanchie Dessert, MD 03/23/22 1745

## 2022-03-17 NOTE — ED Provider Notes (Signed)
I assumed care of this patient.  Please see previous provider note for further details of Hx, PE.  Briefly patient is a 57 y.o. male who presented for abd pain. H/o recent surgery for cholangiocarcinoma at [facility]. Found to have developing intraabdominal abscess. Got Abx.  [facility] accepted under Dr. Binnie Rail. Pending bed availability.  3:33 AM Transport here.      Fatima Blank, MD 03/18/22 [phone]

## 2022-03-17 NOTE — ED Provider Notes (Cosign Needed Addendum)
[facility] EMERGENCY DEPT Provider Note   CSN: [id number] Arrival date & time: 03/17/22  2010     History  Chief Complaint  Patient presents with   Abdominal Pain    Jeffrey Blevins is a 57 y.o. male with PMH significant for hilar cholangiocarcinoma who presents with abdominal pain. Patient recently discharged from [place] 2 days ago after admission for surgical management of his cholangiocarcinoma. Per chart review, patient underwent laparoscopy with portal lymphadenectomy, cholecystectomy, wedge resection of caudate lobe, and vascularized pedicle flap (falciform ligament) on 03/08/22. A few days post-op he underwent IR placement of biliary stent into the R hepatic ducts in place of a biliary drain.   Patient had been doing well at home until this afternoon when he developed sudden onset abdominal pain. Felt like something ripped or burst in the region of his surgical site. Pain has progressively worsened since it's onset a few hours ago. He took his home oxycodone '5mg'$  with no change in his pain. Has associated nausea which he attributes to the severe pain. No vomiting. Had a normal BM today. No fever, chest pain, or other complaints. Had been tolerating PO up to this point.      Home Medications Prior to Admission medications   Medication Sig Start Date End Date Taking? Authorizing Provider  acetaminophen (TYLENOL) 500 MG tablet Take 1,000 mg by mouth 3 (three) times daily as needed for headache (pain). Patient not taking: Reported on 12/23/2021    [provider]  amLODipine (NORVASC) 10 MG tablet Take 1 tablet (10 mg total) by mouth daily. 12/23/21   Ladell Pier, MD  apixaban (ELIQUIS) 5 MG TABS tablet TAKE 1 TABLET BY MOUTH TWICE A DAY 02/11/22   Ladell Pier, MD  esomeprazole (NEXIUM) 20 MG capsule Take 20 mg by mouth every morning.    [provider]  ibuprofen (ADVIL) 200 MG tablet Take 400 mg by mouth 3 (three) times daily as needed for headache  (pain).    [provider]  irbesartan (AVAPRO) 150 MG tablet Take 150 mg by mouth daily. Patient not taking: Reported on 09/30/2021    [provider]  lidocaine-prilocaine (EMLA) cream Apply 1 Application topically as needed. 12/23/21   Ladell Pier, MD  LORazepam (ATIVAN) 0.5 MG tablet Take 1 tablet at bedtime as needed for insomnia Patient not taking: Reported on 02/17/2022 12/03/21   Ladell Pier, MD  NEULASTA 6 MG/0.6ML injection Inject 0.6 mLs (6 mg total) into the skin as directed. Administer 0.6 ml on day 9 of each chemo cycle at least 24 hours after chemo administration. Patient not taking: Reported on 02/17/2022 01/19/22   Ladell Pier, MD  ondansetron (ZOFRAN) 8 MG tablet Take 1 tablet (8 mg total) by mouth every 8 (eight) hours as needed for nausea or vomiting (Starting day 4 after chemo as needed). Patient not taking: Reported on 02/17/2022 12/03/21   Ladell Pier, MD  prochlorperazine (COMPAZINE) 10 MG tablet Take 1 tablet (10 mg total) by mouth every 6 (six) hours as needed for nausea or vomiting. Patient not taking: Reported on 02/17/2022 10/28/21   Ladell Pier, MD  traMADol (ULTRAM) 50 MG tablet Take 0.5-1 tablets (25-50 mg total) by mouth every 6 (six) hours as needed. 01/19/22   Ladell Pier, MD      Allergies    Codeine    Review of Systems   Review of Systems  Constitutional:  Negative for chills and fever.  Respiratory:  Negative for shortness of breath.   Cardiovascular:  Negative for chest pain.  Gastrointestinal:  Positive for abdominal pain and nausea. Negative for constipation and vomiting.  Genitourinary:  Negative for difficulty urinating and dysuria.  Neurological:  Negative for dizziness.    Physical Exam Updated Vital Signs BP (!) 132/113   Pulse (!) 126   Temp 97.7 F (36.5 C) (Oral)   Resp 20   Ht '6\' 3"'$  (1.905 m)   Wt 91.9 kg   SpO2 92%   BMI 25.32 kg/m  Physical Exam Constitutional:      Appearance: He  is ill-appearing.  HENT:     Mouth/Throat:     Mouth: Mucous membranes are moist.  Cardiovascular:     Rate and Rhythm: Tachycardia present.     Heart sounds: Normal heart sounds.  Pulmonary:     Effort: Pulmonary effort is normal.     Breath sounds: Normal breath sounds.  Abdominal:     General: Bowel sounds are normal. There is no distension.     Tenderness: There is abdominal tenderness.     Comments: Well-appearing surgical site, staples in place, no dehiscence or surrounding erythema, drainage  Skin:    Coloration: Skin is pale.  Neurological:     General: No focal deficit present.     Mental Status: He is alert.     ED Results / Procedures / Treatments   Labs (all labs ordered are listed, but only abnormal results are displayed) Labs Reviewed  COMPREHENSIVE METABOLIC PANEL - Abnormal; Notable for the following components:      Result Value   Sodium 127 (*)    Chloride 96 (*)    Glucose, Bld 140 (*)    Calcium 8.7 (*)    Albumin 3.2 (*)    All other components within normal limits  CBC - Abnormal; Notable for the following components:   WBC 20.2 (*)    RBC 3.55 (*)    Hemoglobin 9.6 (*)    HCT 29.5 (*)    RDW 15.9 (*)    All other components within normal limits  LIPASE, BLOOD  LACTIC ACID, PLASMA  URINALYSIS, ROUTINE W REFLEX MICROSCOPIC  LACTIC ACID, PLASMA    EKG None  Radiology CT Abdomen Pelvis W Contrast  Result Date: 03/17/2022 CLINICAL DATA:  Abdominal pain for 5 hours, history of liver carcinoma with biliary stenting and recent gallbladder removal 10 days ago EXAM: CT ABDOMEN AND PELVIS WITH CONTRAST TECHNIQUE: Multidetector CT imaging of the abdomen and pelvis was performed using the standard protocol following bolus administration of intravenous contrast. RADIATION DOSE REDUCTION: This exam was performed according to the departmental dose-optimization program which includes automated exposure control, adjustment of the mA and/or kV according to  patient size and/or use of iterative reconstruction technique. CONTRAST:  80 mL Omnipaque 300 COMPARISON:  MRI from 12/16/2018 FINDINGS: Lower chest: Left lung base is within normal limits. Right lung base demonstrates considerable consolidation in the right lower lobe with associated effusion. Reflux is noted in the distal esophagus. Hepatobiliary: Biliary stent is noted in the common bile duct. The gallbladder has been surgically removed. Some persistent biliary ductal dilatation is noted anteriorly within the right lobe of the liver as well as within the visualized left lobe of the liver. Free air and mild fluid is noted surrounding the liver consistent with the recent surgical intervention. Additionally in the gallbladder fossa there is a 6.6 x 4.6 cm air-fluid collection consistent with focal abscess. This  appears to extend along the medial margin of the inferior liver with a second smaller collection adjacent to the duodenum which measures 4.0 x 3.2 cm. Pancreas: Unremarkable. No pancreatic ductal dilatation or surrounding inflammatory changes. Spleen: Normal in size without focal abnormality. Mild perisplenic fluid is noted. Adrenals/Urinary Tract: Adrenal glands are within normal limits. 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 The bladder is well distended. Stomach/Bowel: Contrast and fecal material is noted throughout the colon. The appendix is filled with contrast material. Small bowel and stomach appear within normal limits. Vascular/Lymphatic: Aortic atherosclerosis. No enlarged abdominal or pelvic lymph nodes. Reproductive: Prostate is unremarkable. Other: Free fluid is noted within the pelvis as well as a mild amount of free air consistent with the recent surgical procedure. There are however, organized air-fluid collections identified in the gallbladder fossa as well as adjacent to the distal stomach and along the second portion of the duodenum. The  perigastric collection measures 4.1 x 3.6 cm. These are all most consistent with postoperative abscesses. Musculoskeletal: No acute or significant osseous findings. IMPRESSION: Postsurgical changes consistent with the given clinical history. Multiple postoperative air-fluid collections are identified in the right upper quadrant as described consistent with developing postoperative abscesses. Considerable free fluid is noted within the abdomen and pelvis some of which may be related to the prior surgery although additionally related to the developing abscesses. Right-sided effusion and right lower lobe consolidation. Critical Value/emergent results were called by telephone at the time of interpretation on 03/17/2022 at 10:17 pm to Dr. Blanchie Dessert , who verbally acknowledged these results. Electronically Signed   By: Inez Catalina M.D.   On: 03/17/2022 22:21    Procedures Procedures    Medications Ordered in ED Medications  piperacillin-tazobactam (ZOSYN) IVPB 3.375 g (3.375 g Intravenous New Bag/Given 03/17/22 2254)  HYDROmorphone (DILAUDID) injection 1 mg (1 mg Intravenous Given 03/17/22 2105)  ondansetron (ZOFRAN) injection 4 mg (4 mg Intravenous Given 03/17/22 2104)  HYDROmorphone (DILAUDID) injection 0.5 mg (0.5 mg Intravenous Given 03/17/22 2249)  iohexol (OMNIPAQUE) 300 MG/ML solution 100 mL (100 mLs Intravenous Contrast Given 03/17/22 2149)    ED Course/ Medical Decision Making/ A&P                             Medical Decision Making This is a 57 year old male with PMH significant for cholangiocarcinoma who recently underwent abdominal laparoscopy with portal lymphadenectomy, cholecystectomy, wedge resection of caudate lobe, vascularized pedicle flap and biliary stent placement at [facility] on 1/9 who presents with severe abdominal pain.  Patient pale, ill-appearing, in obvious distress secondary to pain on arrival.  Tachycardic to 120s but remainder of vitals within normal limits.  Abdomen  diffusely tender but surgical site well-appearing on exam.  Concern for postop complication including hematoma, abscess, or perforation.  Will obtain CBC, CMP, lipase, lactate and CT abdomen pelvis.  IV Dilaudid and Zofran ordered for symptomatic relief.  CBC significant for leukocytosis to 20.2, hemoglobin 9.6 which is stable from prior values.  CMP with sodium of 127, but interestingly LFTs and bili within normal range.  Reassuringly lactate 1.5.  Patient reports slight improvement in pain after 1 mg of Dilaudid.  Additional 0.5 mg ordered.  CT images independently reviewed, demonstrate multiple air-fluid levels concerning for developing abscess.  Results discussed with patient at bedside.  Spoke with [facility] transfer line, patient accepted for transfer to inpatient bed by Dr. Binnie Rail. [facility] to call back when  bed becomes available.   Amount and/or Complexity of Data Reviewed Labs: ordered. Radiology: ordered.  Risk Prescription drug management.   Final Clinical Impression(s) / ED Diagnoses Final diagnoses:  Abdominal pain, unspecified abdominal location  Postprocedural intraabdominal abscess    Rx / DC Orders ED Discharge Orders     None         Alcus Dad, MD 03/17/22 [id number]    Alcus Dad, MD 03/17/22 [id number]    Blanchie Dessert, MD 03/23/22 1501

## 2022-03-17 NOTE — ED Triage Notes (Addendum)
Patient here POV from Home.  Endorses Mid ABD Pain that began 5 Hours ago. Some nausea. No Emesis. No Diarrhea.   History of Liver Cancer. Not currently under Chemotherapy. Recent Gall Bladder removal 10 days ago.  NAD Noted during Triage, A&Ox4. GCS 15. BIB Wheelchair

## 2022-03-18 NOTE — ED Notes (Signed)
RT placed pt on [place] 3 Lpm for desaturation to <90% on RA. Pt sats post placement are currently 95% on [place] 3 Lpm. Pt respiratory status is stable w/no distress noted at this time.

## 2022-03-21 ENCOUNTER — Other Ambulatory Visit: Payer: Self-pay

## 2022-03-23 ENCOUNTER — Other Ambulatory Visit: Payer: Self-pay

## 2022-03-25 ENCOUNTER — Other Ambulatory Visit: Payer: Self-pay

## 2022-03-28 DIAGNOSIS — Z4802 Encounter for removal of sutures: Secondary | ICD-10-CM | POA: Insufficient documentation

## 2022-04-05 ENCOUNTER — Telehealth: Payer: Self-pay | Admitting: *Deleted

## 2022-04-05 ENCOUNTER — Other Ambulatory Visit: Payer: Self-pay

## 2022-04-05 NOTE — Telephone Encounter (Signed)
Called Jeffrey Blevins to determine if he was ready to see Dr. Benay Spice again and he agrees. Per Dr. Benay Spice, schedule for 2/16 since he sees Dr. Fayrene Helper on 04/11/22. Scheduling message sent.

## 2022-04-06 ENCOUNTER — Other Ambulatory Visit: Payer: Self-pay

## 2022-04-07 ENCOUNTER — Other Ambulatory Visit: Payer: Self-pay

## 2022-04-08 ENCOUNTER — Other Ambulatory Visit: Payer: Self-pay | Admitting: *Deleted

## 2022-04-08 DIAGNOSIS — C221 Intrahepatic bile duct carcinoma: Secondary | ICD-10-CM

## 2022-04-09 ENCOUNTER — Other Ambulatory Visit: Payer: Self-pay | Admitting: Oncology

## 2022-04-10 ENCOUNTER — Other Ambulatory Visit: Payer: Self-pay | Admitting: Oncology

## 2022-04-10 DIAGNOSIS — C221 Intrahepatic bile duct carcinoma: Secondary | ICD-10-CM

## 2022-04-15 ENCOUNTER — Inpatient Hospital Stay: Payer: Managed Care, Other (non HMO) | Attending: Oncology

## 2022-04-15 ENCOUNTER — Inpatient Hospital Stay (HOSPITAL_BASED_OUTPATIENT_CLINIC_OR_DEPARTMENT_OTHER): Payer: Managed Care, Other (non HMO) | Admitting: Oncology

## 2022-04-15 ENCOUNTER — Inpatient Hospital Stay: Payer: Managed Care, Other (non HMO)

## 2022-04-15 VITALS — BP 136/83 | HR 94 | Temp 98.2°F | Resp 18 | Ht 75.0 in | Wt 188.6 lb

## 2022-04-15 DIAGNOSIS — D649 Anemia, unspecified: Secondary | ICD-10-CM | POA: Diagnosis not present

## 2022-04-15 DIAGNOSIS — C221 Intrahepatic bile duct carcinoma: Secondary | ICD-10-CM

## 2022-04-15 DIAGNOSIS — J90 Pleural effusion, not elsewhere classified: Secondary | ICD-10-CM | POA: Diagnosis not present

## 2022-04-15 LAB — CBC WITH DIFFERENTIAL (CANCER CENTER ONLY)
Abs Immature Granulocytes: 0.03 10*3/uL (ref 0.00–0.07)
Basophils Absolute: 0.1 10*3/uL (ref 0.0–0.1)
Basophils Relative: 1 %
Eosinophils Absolute: 0.7 10*3/uL — ABNORMAL HIGH (ref 0.0–0.5)
Eosinophils Relative: 7 %
HCT: 29.5 % — ABNORMAL LOW (ref 39.0–52.0)
Hemoglobin: 9.2 g/dL — ABNORMAL LOW (ref 13.0–17.0)
Immature Granulocytes: 0 %
Lymphocytes Relative: 21 %
Lymphs Abs: 2 10*3/uL (ref 0.7–4.0)
MCH: 25.3 pg — ABNORMAL LOW (ref 26.0–34.0)
MCHC: 31.2 g/dL (ref 30.0–36.0)
MCV: 81.3 fL (ref 80.0–100.0)
Monocytes Absolute: 0.6 10*3/uL (ref 0.1–1.0)
Monocytes Relative: 6 %
Neutro Abs: 6.2 10*3/uL (ref 1.7–7.7)
Neutrophils Relative %: 65 %
Platelet Count: 341 10*3/uL (ref 150–400)
RBC: 3.63 MIL/uL — ABNORMAL LOW (ref 4.22–5.81)
RDW: 16.9 % — ABNORMAL HIGH (ref 11.5–15.5)
WBC Count: 9.5 10*3/uL (ref 4.0–10.5)
nRBC: 0 % (ref 0.0–0.2)

## 2022-04-15 LAB — CMP (CANCER CENTER ONLY)
ALT: 10 U/L (ref 0–44)
AST: 16 U/L (ref 15–41)
Albumin: 3.4 g/dL — ABNORMAL LOW (ref 3.5–5.0)
Alkaline Phosphatase: 120 U/L (ref 38–126)
Anion gap: 8 (ref 5–15)
BUN: 11 mg/dL (ref 6–20)
CO2: 26 mmol/L (ref 22–32)
Calcium: 9.1 mg/dL (ref 8.9–10.3)
Chloride: 102 mmol/L (ref 98–111)
Creatinine: 0.71 mg/dL (ref 0.61–1.24)
GFR, Estimated: >60 mL/min (ref >60)
Glucose, Bld: 103 mg/dL — ABNORMAL HIGH (ref 70–99)
Potassium: 4.1 mmol/L (ref 3.5–5.1)
Sodium: 136 mmol/L (ref 135–145)
Total Bilirubin: 0.3 mg/dL (ref 0.3–1.2)
Total Protein: 7.4 g/dL (ref 6.5–8.1)

## 2022-04-15 NOTE — Progress Notes (Signed)
[facility]   OFFICE PROGRESS NOTE   Diagnosis: Cholangiocarcinoma  INTERVAL HISTORY:   Mr. Fairbairn underwent a laparoscopy, exploratory laparotomy, portal lymphadenectomy, cholecystectomy, and wedge resection of the caudate lobe on 03/08/2022.  There was no evidence of extrahepatic disease.  2 subcapsular lesions were identified in the caudate lobe that were not seen on imaging.  The ultrasound also identified the previously noted caudate lesion seen on CT and MRI.  A biopsy was "highly suspicious "for adenocarcinoma versus treated cancer.  The plan resection was aborted. The final pathology revealed no residual adenocarcinoma and 0/4 nodes involved with malignancy.  He was readmitted 03/18/2022 with a right upper quadrant abscess.  He underwent a CT-guided drain placement and a culture was positive for Klebsiella.  Repeat CTs on 03/21/2022 revealed moderate volume ascites, a decrease in the Sev hepatic air-filled collection and a resolved right pneumothorax.  He saw Dr.Lidsky on 04/11/2022.  He is scheduled for follow-up at [facility] with repeat imaging on 04/25/2022.  He complains of mild exertional dyspnea.  He has discomfort at the right chest wall drain site.  Objective:  Vital signs in last 24 hours:  Blood pressure 136/83, pulse 94, temperature 98.2 F (36.8 C), temperature source Oral, resp. rate 18, height 6' 3"$  (1.905 m), weight 188 lb 9.6 oz (85.5 kg), SpO2 99 %.    Lymphatics: No cervical, supraclavicular, axillary, or inguinal nodes Resp: Decreased breath sounds at the right lower chest, no respiratory distress Cardio: Regular rate and rhythm GI: No hepatosplenomegaly, right low anterior chest drain Vascular: No leg edema    Portacath/PICC-without erythema  Lab Results:  Lab Results  Component Value Date   WBC 9.5 04/15/2022   HGB 9.2 (L) 04/15/2022   HCT 29.5 (L) 04/15/2022   MCV 81.3 04/15/2022   PLT 341 04/15/2022   NEUTROABS 6.2 04/15/2022     CMP  Lab Results  Component Value Date   NA 127 (L) 03/17/2022   K 3.9 03/17/2022   CL 96 (L) 03/17/2022   CO2 22 03/17/2022   GLUCOSE 140 (H) 03/17/2022   BUN 14 03/17/2022   CREATININE 0.90 03/17/2022   CALCIUM 8.7 (L) 03/17/2022   PROT 6.5 03/17/2022   ALBUMIN 3.2 (L) 03/17/2022   AST 20 03/17/2022   ALT 13 03/17/2022   ALKPHOS 118 03/17/2022   BILITOT 1.0 03/17/2022   GFRNONAA >60 03/17/2022    Lab Results  Component Value Date   [id number] 159 (H) 09/30/2021    Medications: I have reviewed the patient's current medications.   Assessment/Plan:  Liver mass concerning for malignancy -CT abdomen/pelvis with contrast 08/14/1998 23-2.9 x 2.7 x 2.7 cm hypoenhancing mass in segment 5 near the liver hilum just above hepatic ductal confluence suspicious for primary bile duct malignancy or primary hepatic malignancy -MRCP 08/14/2021-irregular mass of segment 8 of the liver with perilesional enhancement concerning for cholangiocarcinoma -08/14/2021 CA 19.9 was elevated at 76 -08/16/2021 AFP 6.4 -08/16/2021 cytology from bile duct brushing showed atypical cells -08/19/2021 cytology from bile duct brushing showed cells suspicious for malignancy -08/25/2021 CT-guided liver biopsy-moderately differentiated adenocarcinoma, CK7 positive, CDX2 positive in a small population of cells, TTF-1 negative, CK20 negative-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 -CTs at [facility] 09/06/2021-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, occlusion of the anterior branch of the right portal vein, periportal enhancement and thickening on delayed imaging potentially due to tumor infiltration, no change in the 2 x 3.2 cm mass in segment 8, mild  to moderate left and right intrahepatic biliary dilatation, biliary stent in place, small portacaval and periportal nodes, no evidence of metastatic disease in the chest -Cycle 1 gemcitabine/cisplatin/durvalumab  10/01/2021 -Cycle 2 gemcitabine/cisplatin/durvalumab 10/22/2021 -Cycle 3 gemcitabine/cisplatin/durvalumab 11/11/2021, D1 cisplatin held due to neuropathy, cisplatin resumed day 8 -Cycle 4 gemcitabine/cisplatin/durvalumab 12/03/2021 -MRI abdomen 12/15/2021-segment 8 cholangiocarcinoma no longer measurable, residual biliary duct dilation in the anterior right and left hepatic lobes, no evidence of metastatic disease persistent anterior right and new peripheral left portal vein occlusion -Cycle 5 gemcitabine/cisplatin/durvalumab 12/24/2021 -Cycle 6 gemcitabine/cisplatin/durvalumab 01/13/2022 -CTs at [facility] 02/14/2022-fine hypoattenuating masses in the left and right hepatic lobes, more prominent compared to the 12/15/2021 MRI, potentially representing multifocal cholangiocarcinoma, persistent occlusion of left portal and anterior branch of the right portal vein, no extrahepatic lymphadenopathy or metastatic disease -MRI liver 12/19 /2023-1.3 x 0.8 cm hypoenhancing lesion in segment 8, ill-defined area of delayed enhancement in the caudate measuring 1.7 cm, no left liver lesion, 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  Obstructive jaundice -08/16/2021 percutaneous biliary drain placement 3.  Hepatic steatosis 4.  GERD 5.  Hypertension 6.  Normocytic anemia 7.  Enlarged prostate seen on CT 8.  Tobacco use 9.  Enterococcus and Aerococcus bacteremia 08/20/2021-discharged 08/26/2021 to complete outpatient course of Augmentin 10.  Peripheral neuropathy 11.  Port-A-Cath placement 09/23/2021 12.  Biliary drain  exchange 09/23/2021; drain capped 10/12/2021 13.  Rigors following biliary drain procedure 09/23/2021, biliary drain culture-Enterobacter Cloacae, Bactrim x7 days 09/27/2021 14.  Right leg edema and pain 10/18/2021-Doppler negative for DVT 15.  Admission 03/18/2022 with a right upper quadrant abscess, status post drain placement, culture positive for Klebsiella CTs 03/21/2022-no change in moderate volume ascites, interval decrease in subhepatic air-filled collection with catheter in appropriate position, resolved right pneumothorax, no change in bilateral pleural effusions-moderate on right and small on left        Disposition: Mr. 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  He completed a course of gemcitabine/cisplatin/durvalumab prior to planned resection of the caudate lesion.  He underwent an exploratory laparotomy on 03/08/2022.  He was noted to have multiple lesions in the caudate precluding the planned resection.  The postoperative course was complicated by development of a right upper quadrant abscess.  A drain remains in place.  He is scheduled for repeat imaging and follow-up at [facility] in 2 weeks.  I have discussed the case with Dr. Fayrene Helper.  He recommends resuming durvalumab.  We will plan to resume durvalumab when Mr Kumagai returns on 04/27/2022.  I will contact Dr. Fayrene Helper to discuss the indication for continuing anticoagulation therapy.    Betsy Coder, MD  04/15/2022  9:40 AM

## 2022-04-17 ENCOUNTER — Other Ambulatory Visit: Payer: Self-pay

## 2022-04-23 ENCOUNTER — Other Ambulatory Visit: Payer: Self-pay

## 2022-04-24 ENCOUNTER — Other Ambulatory Visit: Payer: Self-pay | Admitting: Oncology

## 2022-04-26 ENCOUNTER — Inpatient Hospital Stay: Payer: Managed Care, Other (non HMO)

## 2022-04-26 ENCOUNTER — Encounter: Payer: Self-pay | Admitting: Nurse Practitioner

## 2022-04-26 ENCOUNTER — Telehealth: Payer: Self-pay | Admitting: Oncology

## 2022-04-26 ENCOUNTER — Inpatient Hospital Stay: Payer: Managed Care, Other (non HMO) | Admitting: Nurse Practitioner

## 2022-04-26 VITALS — BP 134/92 | HR 84 | Temp 98.1°F | Resp 18 | Ht 75.0 in | Wt 185.0 lb

## 2022-04-26 DIAGNOSIS — J90 Pleural effusion, not elsewhere classified: Secondary | ICD-10-CM

## 2022-04-26 DIAGNOSIS — C221 Intrahepatic bile duct carcinoma: Secondary | ICD-10-CM | POA: Diagnosis not present

## 2022-04-26 NOTE — Telephone Encounter (Signed)
Patient came in today and I attempted to schedule appointment for nutrition but patient advised that at the moment he will pass on the appointment, he also gets nutritionist phone calls through his insurance .

## 2022-04-26 NOTE — Progress Notes (Signed)
[facility] OFFICE PROGRESS NOTE   Diagnosis: Cholangiocarcinoma  INTERVAL HISTORY:   [name] returns as scheduled.  He had a CT scan at [facility] yesterday-3.1 x 2.0 cm fluid collection in the abdomen and pelvis, decreased in volume and increased in organization; resolution of subhepatic collection; increased partially visualized large right pleural effusion with near complete collapse of the right lower lobe.  The right abdomen drainage catheter was removed yesterday.  He has mild pain at the drain site.  He denies shortness of breath.  No cough or fever.  No nausea or vomiting.  Bowels are moving.  He is intermittently dizzy with position change.  He feels fluid intake is adequate.  He estimates appetite is at 50% to 60% of normal.    Dr. Fayrene Helper contacted Dr. Benay Spice yesterday and requested Mr. Desantos have a thoracentesis before resuming Durvalumab.  Objective:  Vital signs in last 24 hours:  Blood pressure (!) 140/96, pulse 95, temperature 98.1 F (36.7 C), temperature source Oral, resp. rate 18, height '6\' 3"'$  (1.905 m), weight 185 lb (83.9 kg), SpO2 100 %.    HEENT: No thrush or ulcers. Resp: Breath sounds diminished right lower one half posteriorly.  No respiratory distress. Cardio: Regular rate and rhythm. GI: Abdomen soft and nontender.  No hepatomegaly. Vascular: No leg edema. Neuro: Alert and oriented. Port-A-Cath without erythema.  Lab Results:  Lab Results  Component Value Date   WBC 9.5 04/15/2022   HGB 9.2 (L) 04/15/2022   HCT 29.5 (L) 04/15/2022   MCV 81.3 04/15/2022   PLT 341 04/15/2022   NEUTROABS 6.2 04/15/2022    Imaging:  No results found.  Medications: I have reviewed the patient's current medications.  Assessment/Plan: Liver mass concerning for malignancy -CT abdomen/pelvis with contrast 08/14/1998 23-2.9 x 2.7 x 2.7 cm hypoenhancing mass in segment 5 near the liver hilum just above hepatic ductal confluence suspicious for primary bile  duct malignancy or primary hepatic malignancy -MRCP 08/14/2021-irregular mass of segment 8 of the liver with perilesional enhancement concerning for cholangiocarcinoma -08/14/2021 CA 19.9 was elevated at 76 -08/16/2021 AFP 6.4 -08/16/2021 cytology from bile duct brushing showed atypical cells -08/19/2021 cytology from bile duct brushing showed cells suspicious for malignancy -08/25/2021 CT-guided liver biopsy-moderately differentiated adenocarcinoma, CK7 positive, CDX2 positive in a small population of cells, TTF-1 negative, CK20 negative-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 -CTs at [facility] 09/06/2021-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, occlusion of the anterior branch of the right portal vein, periportal enhancement and thickening on delayed imaging potentially due to tumor infiltration, no change in the 2 x 3.2 cm mass in segment 8, mild to moderate left and right intrahepatic biliary dilatation, biliary stent in place, small portacaval and periportal nodes, no evidence of metastatic disease in the chest -Cycle 1 gemcitabine/cisplatin/durvalumab 10/01/2021 -Cycle 2 gemcitabine/cisplatin/durvalumab 10/22/2021 -Cycle 3 gemcitabine/cisplatin/durvalumab 11/11/2021, D1 cisplatin held due to neuropathy, cisplatin resumed day 8 -Cycle 4 gemcitabine/cisplatin/durvalumab 12/03/2021 -MRI abdomen 12/15/2021-segment 8 cholangiocarcinoma no longer measurable, residual biliary duct dilation in the anterior right and left hepatic lobes, no evidence of metastatic disease persistent anterior right and new peripheral left portal vein occlusion -Cycle 5 gemcitabine/cisplatin/durvalumab 12/24/2021 -Cycle 6 gemcitabine/cisplatin/durvalumab 01/13/2022 -CTs at [facility] 02/14/2022-fine hypoattenuating masses in the left and right hepatic lobes, more prominent compared to the 12/15/2021 MRI, potentially representing multifocal cholangiocarcinoma, persistent  occlusion of left portal and anterior branch of the right portal vein, no extrahepatic lymphadenopathy or metastatic disease -MRI liver 12/19 /2023-1.3 x  0.8 cm hypoenhancing lesion in segment 8, ill-defined area of delayed enhancement in the caudate measuring 1.7 cm, no left liver lesion, 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  Obstructive jaundice -08/16/2021 percutaneous biliary drain placement 3.  Hepatic steatosis 4.  GERD 5.  Hypertension 6.  Normocytic anemia 7.  Enlarged prostate seen on CT 8.  Tobacco use 9.  Enterococcus and Aerococcus bacteremia 08/20/2021-discharged 08/26/2021 to complete outpatient course of Augmentin 10.  Peripheral neuropathy 11.  Port-A-Cath placement 09/23/2021 12.  Biliary drain exchange 09/23/2021; drain capped 10/12/2021 13.  Rigors following biliary drain procedure 09/23/2021, biliary drain culture-Enterobacter Cloacae, Bactrim x7 days 09/27/2021 14.  Right leg edema and pain 10/18/2021-Doppler negative for DVT 15.  Admission 03/18/2022 with a right upper quadrant abscess, status post drain placement, culture positive for Klebsiella CTs 03/21/2022-no change in moderate volume ascites, interval decrease in subhepatic air-filled collection with catheter in appropriate position, resolved right pneumothorax, no change in bilateral pleural effusions-moderate on right and small on left CTs 04/25/2022 at [facility]-3.1 x 2.0 cm fluid collection in the abdomen and pelvis, decreased in volume and increased in organization; resolution of subhepatic collection; increased partially  visualized large right pleural effusion with near complete collapse of the right lower lobe. Drainage catheter removed 04/25/2022 16.  Large right pleural effusion on CT 04/25/2022-referred for thoracentesis    Disposition: Mr. Garvey appears stable.  He was noted to have a large right pleural effusion on CT scan done at [facility] yesterday.  We are referring him for a thoracentesis, diagnostic/therapeutic.  Durvalumab is being placed on hold pending thoracentesis results.  He will return for follow-up, possible Durvalumab in 1 to 2 weeks.  Patient seen with Dr. Benay Spice.    Ned Card ANP/GNP-BC   04/26/2022  8:50 AM This was a shared visit with Ned Card.  Mr Galante was interviewed and examined.  I discussed the case with Dr.Lidsky today.  He will be referred for a diagnostic/therapeutic right thoracentesis.  The plan is to proceed with durvalumab if the pleural fluid cytology is negative.  I was present for greater than 50% of today's visit.  I performed medical decision making.  Julieanne Manson, MD

## 2022-04-27 ENCOUNTER — Other Ambulatory Visit: Payer: Self-pay | Admitting: Oncology

## 2022-04-27 ENCOUNTER — Other Ambulatory Visit: Payer: Self-pay

## 2022-04-29 ENCOUNTER — Ambulatory Visit (HOSPITAL_COMMUNITY)
Admission: RE | Admit: 2022-04-29 | Discharge: 2022-04-29 | Disposition: A | Discharge status: Home / Self Care | Payer: Managed Care, Other (non HMO) | Source: Ambulatory Visit | Attending: Internal Medicine | Admitting: Internal Medicine

## 2022-04-29 ENCOUNTER — Ambulatory Visit (HOSPITAL_COMMUNITY)
Admission: RE | Admit: 2022-04-29 | Discharge: 2022-04-29 | Disposition: A | Discharge status: Home / Self Care | Payer: Managed Care, Other (non HMO) | Source: Ambulatory Visit | Attending: Nurse Practitioner | Admitting: Nurse Practitioner

## 2022-04-29 DIAGNOSIS — J90 Pleural effusion, not elsewhere classified: Secondary | ICD-10-CM

## 2022-04-29 DIAGNOSIS — C221 Intrahepatic bile duct carcinoma: Secondary | ICD-10-CM | POA: Insufficient documentation

## 2022-04-29 LAB — BODY FLUID CELL COUNT WITH DIFFERENTIAL
Eos, Fluid: 51 %
Lymphs, Fluid: 22 %
Monocyte-Macrophage-Serous Fluid: 26 % — ABNORMAL LOW (ref 50–90)
Neutrophil Count, Fluid: 1 % (ref 0–25)
Total Nucleated Cell Count, Fluid: 1397 cu mm — ABNORMAL HIGH (ref 0–1000)

## 2022-04-29 MED ORDER — LIDOCAINE HCL 1 % IJ SOLN
INTRAMUSCULAR | Status: AC
  Filled 2022-04-29: qty 20

## 2022-04-29 NOTE — Procedures (Signed)
PROCEDURE SUMMARY:  Successful US guided diagnostic and therapeutic right thoracentesis. Yielded 1.75 L of clear, yellow fluid. Pt tolerated procedure well. No immediate complications.  Specimen was sent for labs. CXR ordered.  EBL < 1 mL  Tyson Alias, AGNP 04/29/2022 11:21 AM

## 2022-04-30 LAB — TRIGLYCERIDES, BODY FLUIDS: Triglycerides, Fluid: 29 mg/dL

## 2022-05-02 LAB — BODY FLUID CULTURE W GRAM STAIN: Culture: NO GROWTH

## 2022-05-03 ENCOUNTER — Ambulatory Visit (HOSPITAL_COMMUNITY): Payer: Managed Care, Other (non HMO)

## 2022-05-04 LAB — CYTOLOGY - NON PAP

## 2022-05-05 ENCOUNTER — Inpatient Hospital Stay: Payer: Managed Care, Other (non HMO) | Admitting: Nurse Practitioner

## 2022-05-05 ENCOUNTER — Ambulatory Visit (HOSPITAL_BASED_OUTPATIENT_CLINIC_OR_DEPARTMENT_OTHER)
Admission: RE | Admit: 2022-05-05 | Discharge: 2022-05-05 | Disposition: A | Discharge status: Home / Self Care | Payer: Managed Care, Other (non HMO) | Source: Ambulatory Visit | Attending: Nurse Practitioner | Admitting: Nurse Practitioner

## 2022-05-05 ENCOUNTER — Inpatient Hospital Stay: Payer: Managed Care, Other (non HMO) | Attending: Oncology

## 2022-05-05 ENCOUNTER — Inpatient Hospital Stay: Payer: Managed Care, Other (non HMO)

## 2022-05-05 ENCOUNTER — Encounter: Payer: Self-pay | Admitting: Nurse Practitioner

## 2022-05-05 VITALS — BP 136/92 | HR 75 | Temp 98.1°F | Resp 18 | Ht 75.0 in | Wt 181.0 lb

## 2022-05-05 VITALS — BP 145/89 | HR 68

## 2022-05-05 DIAGNOSIS — Z79620 Long term (current) use of immunosuppressive biologic: Secondary | ICD-10-CM | POA: Diagnosis not present

## 2022-05-05 DIAGNOSIS — C221 Intrahepatic bile duct carcinoma: Secondary | ICD-10-CM | POA: Insufficient documentation

## 2022-05-05 DIAGNOSIS — Z5112 Encounter for antineoplastic immunotherapy: Secondary | ICD-10-CM | POA: Insufficient documentation

## 2022-05-05 LAB — CMP (CANCER CENTER ONLY)
ALT: 11 U/L (ref 0–44)
AST: 19 U/L (ref 15–41)
Albumin: 3.6 g/dL (ref 3.5–5.0)
Alkaline Phosphatase: 126 U/L (ref 38–126)
Anion gap: 7 (ref 5–15)
BUN: 17 mg/dL (ref 6–20)
CO2: 26 mmol/L (ref 22–32)
Calcium: 9.3 mg/dL (ref 8.9–10.3)
Chloride: 103 mmol/L (ref 98–111)
Creatinine: 0.91 mg/dL (ref 0.61–1.24)
GFR, Estimated: >60 mL/min (ref >60)
Glucose, Bld: 102 mg/dL — ABNORMAL HIGH (ref 70–99)
Potassium: 4.1 mmol/L (ref 3.5–5.1)
Sodium: 136 mmol/L (ref 135–145)
Total Bilirubin: 0.5 mg/dL (ref 0.3–1.2)
Total Protein: 7.6 g/dL (ref 6.5–8.1)

## 2022-05-05 LAB — CBC WITH DIFFERENTIAL (CANCER CENTER ONLY)
Abs Immature Granulocytes: 0.03 10*3/uL (ref 0.00–0.07)
Basophils Absolute: 0 10*3/uL (ref 0.0–0.1)
Basophils Relative: 0 %
Eosinophils Absolute: 0.8 10*3/uL — ABNORMAL HIGH (ref 0.0–0.5)
Eosinophils Relative: 11 %
HCT: 32.7 % — ABNORMAL LOW (ref 39.0–52.0)
Hemoglobin: 10.3 g/dL — ABNORMAL LOW (ref 13.0–17.0)
Immature Granulocytes: 0 %
Lymphocytes Relative: 29 %
Lymphs Abs: 2.3 10*3/uL (ref 0.7–4.0)
MCH: 24.7 pg — ABNORMAL LOW (ref 26.0–34.0)
MCHC: 31.5 g/dL (ref 30.0–36.0)
MCV: 78.4 fL — ABNORMAL LOW (ref 80.0–100.0)
Monocytes Absolute: 0.5 10*3/uL (ref 0.1–1.0)
Monocytes Relative: 6 %
Neutro Abs: 4.1 10*3/uL (ref 1.7–7.7)
Neutrophils Relative %: 54 %
Platelet Count: 250 10*3/uL (ref 150–400)
RBC: 4.17 MIL/uL — ABNORMAL LOW (ref 4.22–5.81)
RDW: 16.5 % — ABNORMAL HIGH (ref 11.5–15.5)
WBC Count: 7.8 10*3/uL (ref 4.0–10.5)
nRBC: 0 % (ref 0.0–0.2)

## 2022-05-05 LAB — TSH: TSH: 2.906 u[IU]/mL (ref 0.350–4.500)

## 2022-05-05 MED ORDER — SODIUM CHLORIDE 0.9% FLUSH
10.0000 mL | INTRAVENOUS | Status: DC | PRN
  Administered 2022-05-05: 10 mL

## 2022-05-05 MED ORDER — SODIUM CHLORIDE 0.9 % IV SOLN
1500.0000 mg | Freq: Once | INTRAVENOUS | Status: AC
  Administered 2022-05-05: 1500 mg via INTRAVENOUS
  Filled 2022-05-05: qty 30

## 2022-05-05 MED ORDER — HEPARIN SOD (PORK) LOCK FLUSH 100 UNIT/ML IV SOLN
500.0000 [IU] | Freq: Once | INTRAVENOUS | Status: AC | PRN
  Administered 2022-05-05: 500 [IU]

## 2022-05-05 MED ORDER — SODIUM CHLORIDE 0.9 % IV SOLN: Freq: Once | INTRAVENOUS | Status: AC

## 2022-05-05 NOTE — Progress Notes (Signed)
Patient seen by Lisa Thomas, NP today  Vitals are within treatment parameters.   Labs reviewed by Lisa Thomas, NP and are within treatment parameters.  Per physician team, patient is ready for treatment and there are NO modifications to the treatment plan. 

## 2022-05-05 NOTE — Progress Notes (Signed)
[facility] OFFICE PROGRESS NOTE   Diagnosis: Cholangiocarcinoma  INTERVAL HISTORY:   [name] returns as scheduled.  He underwent the thoracentesis procedure on 04/29/2022.  For the past several days he has felt weak, nauseated and more short of breath both at rest and with exertion.  He has a cough and has also been sneezing.  He notes "sinus drainage".  No fever.  Objective:  Vital signs in last 24 hours:  Blood pressure (!) 136/92, pulse 75, temperature 98.1 F (36.7 C), temperature source Oral, resp. rate 18, height '6\' 3"'$  (1.905 m), weight 181 lb (82.1 kg), SpO2 100 %.    HEENT: No thrush or ulcers. Resp: Decreased breath sounds right lower lung field.  No respiratory distress. Cardio: Regular rate and rhythm. GI: Abdomen is tender along the right surgical scar and at the right lower abdomen. Vascular: No leg edema. Neuro: Alert and oriented. Port-A-Cath without erythema.  Lab Results:  Lab Results  Component Value Date   WBC 7.8 05/05/2022   HGB 10.3 (L) 05/05/2022   HCT 32.7 (L) 05/05/2022   MCV 78.4 (L) 05/05/2022   PLT 250 05/05/2022   NEUTROABS 4.1 05/05/2022    Imaging:  No results found.  Medications: I have reviewed the patient's current medications.  Assessment/Plan: Liver mass concerning for malignancy -CT abdomen/pelvis with contrast 08/14/1998 23-2.9 x 2.7 x 2.7 cm hypoenhancing mass in segment 5 near the liver hilum just above hepatic ductal confluence suspicious for primary bile duct malignancy or primary hepatic malignancy -MRCP 08/14/2021-irregular mass of segment 8 of the liver with perilesional enhancement concerning for cholangiocarcinoma -08/14/2021 CA 19.9 was elevated at 76 -08/16/2021 AFP 6.4 -08/16/2021 cytology from bile duct brushing showed atypical cells -08/19/2021 cytology from bile duct brushing showed cells suspicious for malignancy -08/25/2021 CT-guided liver biopsy-moderately differentiated adenocarcinoma, CK7 positive,  CDX2 positive in a small population of cells, TTF-1 negative, CK20 negative-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 -CTs at [facility] 09/06/2021-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, occlusion of the anterior branch of the right portal vein, periportal enhancement and thickening on delayed imaging potentially due to tumor infiltration, no change in the 2 x 3.2 cm mass in segment 8, mild to moderate left and right intrahepatic biliary dilatation, biliary stent in place, small portacaval and periportal nodes, no evidence of metastatic disease in the chest -Cycle 1 gemcitabine/cisplatin/durvalumab 10/01/2021 -Cycle 2 gemcitabine/cisplatin/durvalumab 10/22/2021 -Cycle 3 gemcitabine/cisplatin/durvalumab 11/11/2021, D1 cisplatin held due to neuropathy, cisplatin resumed day 8 -Cycle 4 gemcitabine/cisplatin/durvalumab 12/03/2021 -MRI abdomen 12/15/2021-segment 8 cholangiocarcinoma no longer measurable, residual biliary duct dilation in the anterior right and left hepatic lobes, no evidence of metastatic disease persistent anterior right and new peripheral left portal vein occlusion -Cycle 5 gemcitabine/cisplatin/durvalumab 12/24/2021 -Cycle 6 gemcitabine/cisplatin/durvalumab 01/13/2022 -CTs at [facility] 02/14/2022-fine hypoattenuating masses in the left and right hepatic lobes, more prominent compared to the 12/15/2021 MRI, potentially representing multifocal cholangiocarcinoma, persistent occlusion of left portal and anterior branch of the right portal vein, no extrahepatic lymphadenopathy or metastatic disease -MRI liver 12/19 /2023-1.3 x 0.8 cm hypoenhancing lesion in segment 8, ill-defined area of delayed enhancement in the caudate measuring 1.7 cm, no left liver lesion, 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  Obstructive jaundice -08/16/2021 percutaneous biliary drain placement 3.  Hepatic steatosis 4.  GERD 5.  Hypertension 6.  Normocytic anemia 7.  Enlarged prostate seen on CT 8.  Tobacco use 9.  Enterococcus and Aerococcus bacteremia 08/20/2021-discharged 08/26/2021 to complete outpatient course of Augmentin 10.  Peripheral neuropathy 11.  Port-A-Cath placement 09/23/2021 12.  Biliary drain exchange 09/23/2021; drain capped 10/12/2021 13.  Rigors following biliary drain procedure 09/23/2021, biliary drain culture-Enterobacter Cloacae, Bactrim x7 days 09/27/2021 14.  Right leg edema and pain 10/18/2021-Doppler negative for DVT 15.  Admission 03/18/2022 with a right upper quadrant abscess, status post drain placement, culture positive for Klebsiella CTs 03/21/2022-no change in moderate volume ascites, interval decrease in subhepatic air-filled collection with catheter in appropriate position, resolved right pneumothorax, no change in bilateral pleural effusions-moderate on right and small on left CTs 04/25/2022 at [facility]-3.1 x 2.0 cm fluid collection in the abdomen and pelvis, decreased in volume and increased in organization; resolution of subhepatic collection; increased partially visualized large right pleural effusion with near complete collapse of the right lower lobe. Drainage catheter removed 04/25/2022 16.  Large right pleural effusion on CT 04/25/2022-thoracentesis 04/29/2022, 1.75 L of pleural fluid removed; culture negative, cytology with reactive mesothelial cells present, acute inflammation  Disposition: Jeffrey Blevins appears stable.  Cytology on the pleural fluid returned negative.  Plan to proceed with Durvalumab today as scheduled, then  every 4 weeks.  He agrees with this plan.  Chest x-ray from today shows trace R pleural effusion, decreased from prior.  CBC and chemistry panel reviewed.  Labs adequate to proceed as above.  The right abdomen pain may be related to nerve damage.  This will hopefully improve over the next few weeks.  He will return for lab, follow-up, Durvalumab in 4 weeks.  He will contact the office in the interim with any problems.  Patient seen with Dr. Benay Spice.  Ned Card ANP/GNP-BC   05/05/2022  9:12 AM This was a shared visit with Ned Card.  Jeffrey Blevins was interviewed and examined.  Right pleural fluid cytology was negative for malignancy.  We reviewed a follow-up chest x-ray today.  He appears to have mild upper respiratory infection.  The plan is to proceed with durvalumab.  Right abdomen pain/tenderness is likely related to postoperative change.  I was present for greater than 50% of today's visit.  I performed medical decision making.  Julieanne Manson, MD

## 2022-05-05 NOTE — Patient Instructions (Signed)
[facility]   Discharge Instructions: Thank you for choosing [facility] to provide your oncology and hematology care.   If you have a lab appointment with the [facility], please go directly to the [facility] and check in at the registration area.   Wear comfortable clothing and clothing appropriate for easy access to any Portacath or PICC line.   We strive to give you quality time with your provider. You may need to reschedule your appointment if you arrive late (15 or more minutes).  Arriving late affects you and other patients whose appointments are after yours.  Also, if you miss three or more appointments without notifying the office, you may be dismissed from the clinic at the provider's discretion.      For prescription refill requests, have your pharmacy contact our office and allow 72 hours for refills to be completed.    Today you received the following chemotherapy and/or immunotherapy agents Imfinzi      To help prevent nausea and vomiting after your treatment, we encourage you to take your nausea medication as directed.  BELOW ARE SYMPTOMS THAT SHOULD BE REPORTED IMMEDIATELY: *FEVER GREATER THAN 100.4 F (38 C) OR HIGHER *CHILLS OR SWEATING *NAUSEA AND VOMITING THAT IS NOT CONTROLLED WITH YOUR NAUSEA MEDICATION *UNUSUAL SHORTNESS OF BREATH *UNUSUAL BRUISING OR BLEEDING *URINARY PROBLEMS (pain or burning when urinating, or frequent urination) *BOWEL PROBLEMS (unusual diarrhea, constipation, pain near the anus) TENDERNESS IN MOUTH AND THROAT WITH OR WITHOUT PRESENCE OF ULCERS (sore throat, sores in mouth, or a toothache) UNUSUAL RASH, SWELLING OR PAIN  UNUSUAL VAGINAL DISCHARGE OR ITCHING   Items with * indicate a potential emergency and should be followed up as soon as possible or go to the Emergency Department if any problems should occur.  Please show the CHEMOTHERAPY ALERT CARD or IMMUNOTHERAPY ALERT CARD at check-in  to the Emergency Department and triage nurse.  Should you have questions after your visit or need to cancel or reschedule your appointment, please contact [facility]  Dept: [phone]  and follow the prompts.  Office hours are 8:00 a.m. to 4:30 p.m. Monday - Friday. Please note that voicemails left after 4:00 p.m. may not be returned until the following business day.  We are closed weekends and major holidays. You have access to a nurse at all times for urgent questions. Please call the main number to the clinic Dept: [phone] and follow the prompts.   For any non-urgent questions, you may also contact your provider using MyChart. We now offer e-Visits for anyone 47 and older to request care online for non-urgent symptoms. For details visit [email or web address].   Also download the MyChart app! Go to the app store, search "MyChart", open the app, select [facility], and log in with your MyChart username and password.  Durvalumab Injection What is this medication? DURVALUMAB (dur VAL ue mab) treats some types of cancer. It works by helping your immune system slow or stop the spread of cancer cells. It is a monoclonal antibody. This medicine may be used for other purposes; ask your health care provider or pharmacist if you have questions. COMMON BRAND NAME(S): IMFINZI What should I tell my care team before I take this medication? They need to know if you have any of these conditions: Allogeneic stem cell transplant (uses someone else's stem cells) Autoimmune diseases, such as Crohn disease, ulcerative colitis, lupus History of chest radiation Nervous system problems,  such as Guillain-Barre syndrome, myasthenia gravis Organ transplant An unusual or allergic reaction to durvalumab, other medications, foods, dyes, or preservatives Pregnant or trying to get pregnant Breast-feeding How should I use this medication? This medication is infused into a vein.  It is given by your care team in a hospital or clinic setting. A special MedGuide will be given to you before each treatment. Be sure to read this information carefully each time. Talk to your care team about the use of this medication in children. Special care may be needed. Overdosage: If you think you have taken too much of this medicine contact a poison control center or emergency room at once. NOTE: This medicine is only for you. Do not share this medicine with others. What if I miss a dose? Keep appointments for follow-up doses. It is important not to miss your dose. Call your care team if you are unable to keep an appointment. What may interact with this medication? Interactions have not been studied. This list may not describe all possible interactions. Give your health care provider a list of all the medicines, herbs, non-prescription drugs, or dietary supplements you use. Also tell them if you smoke, drink alcohol, or use illegal drugs. Some items may interact with your medicine. What should I watch for while using this medication? Your condition will be monitored carefully while you are receiving this medication. You may need blood work while taking this medication. This medication may cause serious skin reactions. They can happen weeks to months after starting the medication. Contact your care team right away if you notice fevers or flu-like symptoms with a rash. The rash may be red or purple and then turn into blisters or peeling of the skin. You may also notice a red rash with swelling of the face, lips, or lymph nodes in your neck or under your arms. Tell your care team right away if you have any change in your eyesight. Talk to your care team if you may be pregnant. Serious birth defects can occur if you take this medication during pregnancy and for 3 months after the last dose. You will need a negative pregnancy test before starting this medication. Contraception is recommended while  taking this medication and for 3 months after the last dose. Your care team can help you find the option that works for you. Do not breastfeed while taking this medication and for 3 months after the last dose. What side effects may I notice from receiving this medication? Side effects that you should report to your care team as soon as possible: Allergic reactions--skin rash, itching, hives, swelling of the face, lips, tongue, or throat Dry cough, shortness of breath or trouble breathing Eye pain, redness, irritation, or discharge with blurry or decreased vision Heart muscle inflammation--unusual weakness or fatigue, shortness of breath, chest pain, fast or irregular heartbeat, dizziness, swelling of the ankles, feet, or hands Hormone gland problems--headache, sensitivity to light, unusual weakness or fatigue, dizziness, fast or irregular heartbeat, increased sensitivity to cold or heat, excessive sweating, constipation, hair loss, increased thirst or amount of urine, tremors or shaking, irritability Infusion reactions--chest pain, shortness of breath or trouble breathing, feeling faint or lightheaded Kidney injury (glomerulonephritis)--decrease in the amount of urine, red or dark brown urine, foamy or bubbly urine, swelling of the ankles, hands, or feet Liver injury--right upper belly pain, loss of appetite, nausea, light-colored stool, dark yellow or brown urine, yellowing skin or eyes, unusual weakness or fatigue Pain, tingling, or numbness in  the hands or feet, muscle weakness, change in vision, confusion or trouble speaking, loss of balance or coordination, trouble walking, seizures Rash, fever, and swollen lymph nodes Redness, blistering, peeling, or loosening of the skin, including inside the mouth Sudden or severe stomach pain, bloody diarrhea, fever, nausea, vomiting Side effects that usually do not require medical attention (report these to your care team if they continue or are  bothersome): Bone, joint, or muscle pain Diarrhea Fatigue Loss of appetite Nausea Skin rash This list may not describe all possible side effects. Call your doctor for medical advice about side effects. You may report side effects to FDA at 1-800-FDA-1088. Where should I keep my medication? This medication is given in a hospital or clinic. It will not be stored at home. NOTE: This sheet is a summary. It may not cover all possible information. If you have questions about this medicine, talk to your doctor, pharmacist, or health care provider.  2023 Elsevier/Gold Standard (2021-06-18 00:00:00)

## 2022-05-07 LAB — T4: T4, Total: 8 ug/dL (ref 4.5–12.0)

## 2022-05-18 ENCOUNTER — Encounter: Payer: Self-pay | Admitting: Oncology

## 2022-05-18 ENCOUNTER — Other Ambulatory Visit: Payer: Self-pay | Admitting: Hematology and Oncology

## 2022-05-18 NOTE — Progress Notes (Signed)
OFF PATHWAY REGIMEN - Other  No Change  Continue With Treatment as Ordered.  Original Decision Date/Time: 09/15/2021 13:48   [id number]:Cisplatin IV D1,8 + Durvalumab 1,500 mg IV D1 + Gemcitabine IV D1,8 q21 Days for up to 8 Cycles Followed by Durvalumab 1,500 mg IV D1 q28 Days:   Cycles 1 through up to 8: A cycle is every 21 days:     Durvalumab      Gemcitabine      Cisplatin    Cycles 9 and beyond: A cycle is every 28 days:     Durvalumab   **Always confirm dose/schedule in your pharmacy ordering system**  Patient Characteristics: Intent of Therapy: Non-Curative / Palliative Intent, Discussed with Patient 

## 2022-05-28 ENCOUNTER — Other Ambulatory Visit: Payer: Self-pay | Admitting: Oncology

## 2022-06-01 ENCOUNTER — Inpatient Hospital Stay: Payer: Managed Care, Other (non HMO)

## 2022-06-01 ENCOUNTER — Inpatient Hospital Stay: Payer: Managed Care, Other (non HMO) | Admitting: Nutrition

## 2022-06-01 ENCOUNTER — Inpatient Hospital Stay: Payer: Managed Care, Other (non HMO) | Attending: Oncology

## 2022-06-01 ENCOUNTER — Inpatient Hospital Stay: Payer: Managed Care, Other (non HMO) | Admitting: Oncology

## 2022-06-01 ENCOUNTER — Encounter: Payer: Self-pay | Admitting: *Deleted

## 2022-06-01 ENCOUNTER — Other Ambulatory Visit: Payer: Self-pay | Admitting: *Deleted

## 2022-06-01 VITALS — BP 131/89 | HR 57

## 2022-06-01 VITALS — BP 142/87 | HR 61 | Temp 98.2°F | Resp 18 | Ht 75.0 in | Wt 186.0 lb

## 2022-06-01 DIAGNOSIS — Z79620 Long term (current) use of immunosuppressive biologic: Secondary | ICD-10-CM | POA: Diagnosis not present

## 2022-06-01 DIAGNOSIS — C221 Intrahepatic bile duct carcinoma: Secondary | ICD-10-CM

## 2022-06-01 DIAGNOSIS — Z5112 Encounter for antineoplastic immunotherapy: Secondary | ICD-10-CM | POA: Diagnosis present

## 2022-06-01 DIAGNOSIS — D649 Anemia, unspecified: Secondary | ICD-10-CM | POA: Diagnosis not present

## 2022-06-01 LAB — CBC WITH DIFFERENTIAL (CANCER CENTER ONLY)
Abs Immature Granulocytes: 0.01 10*3/uL (ref 0.00–0.07)
Basophils Absolute: 0.1 10*3/uL (ref 0.0–0.1)
Basophils Relative: 1 %
Eosinophils Absolute: 1.3 10*3/uL — ABNORMAL HIGH (ref 0.0–0.5)
Eosinophils Relative: 17 %
HCT: 35.3 % — ABNORMAL LOW (ref 39.0–52.0)
Hemoglobin: 11.1 g/dL — ABNORMAL LOW (ref 13.0–17.0)
Immature Granulocytes: 0 %
Lymphocytes Relative: 31 %
Lymphs Abs: 2.3 10*3/uL (ref 0.7–4.0)
MCH: 25.5 pg — ABNORMAL LOW (ref 26.0–34.0)
MCHC: 31.4 g/dL (ref 30.0–36.0)
MCV: 81.1 fL (ref 80.0–100.0)
Monocytes Absolute: 0.4 10*3/uL (ref 0.1–1.0)
Monocytes Relative: 6 %
Neutro Abs: 3.4 10*3/uL (ref 1.7–7.7)
Neutrophils Relative %: 45 %
Platelet Count: 230 10*3/uL (ref 150–400)
RBC: 4.35 MIL/uL (ref 4.22–5.81)
RDW: 18.6 % — ABNORMAL HIGH (ref 11.5–15.5)
WBC Count: 7.4 10*3/uL (ref 4.0–10.5)
nRBC: 0 % (ref 0.0–0.2)

## 2022-06-01 LAB — CMP (CANCER CENTER ONLY)
ALT: 16 U/L (ref 0–44)
AST: 22 U/L (ref 15–41)
Albumin: 3.8 g/dL (ref 3.5–5.0)
Alkaline Phosphatase: 107 U/L (ref 38–126)
Anion gap: 6 (ref 5–15)
BUN: 20 mg/dL (ref 6–20)
CO2: 26 mmol/L (ref 22–32)
Calcium: 9.4 mg/dL (ref 8.9–10.3)
Chloride: 105 mmol/L (ref 98–111)
Creatinine: 0.8 mg/dL (ref 0.61–1.24)
GFR, Estimated: >60 mL/min (ref >60)
Glucose, Bld: 83 mg/dL (ref 70–99)
Potassium: 4 mmol/L (ref 3.5–5.1)
Sodium: 137 mmol/L (ref 135–145)
Total Bilirubin: 0.4 mg/dL (ref 0.3–1.2)
Total Protein: 7.1 g/dL (ref 6.5–8.1)

## 2022-06-01 LAB — TSH: TSH: 1.683 u[IU]/mL (ref 0.350–4.500)

## 2022-06-01 MED ORDER — SODIUM CHLORIDE 0.9 % IV SOLN: Freq: Once | INTRAVENOUS | Status: AC

## 2022-06-01 MED ORDER — SODIUM CHLORIDE 0.9% FLUSH
10.0000 mL | INTRAVENOUS | Status: DC | PRN
  Administered 2022-06-01: 10 mL

## 2022-06-01 MED ORDER — HEPARIN SOD (PORK) LOCK FLUSH 100 UNIT/ML IV SOLN
500.0000 [IU] | Freq: Once | INTRAVENOUS | Status: AC | PRN
  Administered 2022-06-01: 500 [IU]

## 2022-06-01 MED ORDER — SODIUM CHLORIDE 0.9 % IV SOLN
1500.0000 mg | Freq: Once | INTRAVENOUS | Status: AC
  Administered 2022-06-01: 1500 mg via INTRAVENOUS
  Filled 2022-06-01: qty 30

## 2022-06-01 NOTE — Progress Notes (Signed)
[place] OFFICE PROGRESS NOTE   Diagnosis: Cholangiocarcinoma  INTERVAL HISTORY:   Jeffrey Blevins returns as scheduled.  He completed another cycle of durvalumab on 05/05/2022.  No diarrhea.  He reports pruritus over the chest and thighs with a mild rash.  Mild discomfort at the right upper abdomen.  No dyspnea.  Objective:  Vital signs in last 24 hours:  Blood pressure (!) 142/87, pulse 61, temperature 98.2 F (36.8 C), temperature source Oral, resp. rate 18, height 6\' 3"  (1.905 m), weight 186 lb (84.4 kg), SpO2 100 %.    Resp: Lungs clear bilaterally Cardio: Regular rate and rhythm GI: Mild induration surrounding the right subcostal incision, no discrete mass, mild tenderness Vascular: No leg edema  Skin: Few 1-2 mm erythematous lesions over the chest and thighs  Portacath/PICC-without erythema  Lab Results:  Lab Results  Component Value Date   WBC 7.4 06/01/2022   HGB 11.1 (L) 06/01/2022   HCT 35.3 (L) 06/01/2022   MCV 81.1 06/01/2022   PLT 230 06/01/2022   NEUTROABS 3.4 06/01/2022    CMP  Lab Results  Component Value Date   NA 136 05/05/2022   K 4.1 05/05/2022   CL 103 05/05/2022   CO2 26 05/05/2022   GLUCOSE 102 (H) 05/05/2022   BUN 17 05/05/2022   CREATININE 0.91 05/05/2022   CALCIUM 9.3 05/05/2022   PROT 7.6 05/05/2022   ALBUMIN 3.6 05/05/2022   AST 19 05/05/2022   ALT 11 05/05/2022   ALKPHOS 126 05/05/2022   BILITOT 0.5 05/05/2022   GFRNONAA >60 05/05/2022    Lab Results  Component Value Date   CAN199 159 (H) 09/30/2021    Medications: I have reviewed the patient's current medications.   Assessment/Plan: Liver mass concerning for malignancy -CT abdomen/pelvis with contrast 08/14/1998 23-2.9 x 2.7 x 2.7 cm hypoenhancing mass in segment 5 near the liver hilum just above hepatic ductal confluence suspicious for primary bile duct malignancy or primary hepatic malignancy -MRCP 08/14/2021-irregular mass of segment 8 of the liver with  perilesional enhancement concerning for cholangiocarcinoma -08/14/2021 CA 19.9 was elevated at 76 -08/16/2021 AFP 6.4 -08/16/2021 cytology from bile duct brushing showed atypical cells -08/19/2021 cytology from bile duct brushing showed cells suspicious for malignancy -08/25/2021 CT-guided liver biopsy-moderately differentiated adenocarcinoma, CK7 positive, CDX2 positive in a small population of cells, TTF-1 negative, CK20 negative-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 -CTs at [facility] 09/06/2021-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, occlusion of the anterior branch of the right portal vein, periportal enhancement and thickening on delayed imaging potentially due to tumor infiltration, no change in the 2 x 3.2 cm mass in segment 8, mild to moderate left and right intrahepatic biliary dilatation, biliary stent in place, small portacaval and periportal nodes, no evidence of metastatic disease in the chest -Cycle 1 gemcitabine/cisplatin/durvalumab 10/01/2021 -Cycle 2 gemcitabine/cisplatin/durvalumab 10/22/2021 -Cycle 3 gemcitabine/cisplatin/durvalumab 11/11/2021, D1 cisplatin held due to neuropathy, cisplatin resumed day 8 -Cycle 4 gemcitabine/cisplatin/durvalumab 12/03/2021 -MRI abdomen 12/15/2021-segment 8 cholangiocarcinoma no longer measurable, residual biliary duct dilation in the anterior right and left hepatic lobes, no evidence of metastatic disease persistent anterior right and new peripheral left portal vein occlusion -Cycle 5 gemcitabine/cisplatin/durvalumab 12/24/2021 -Cycle 6 gemcitabine/cisplatin/durvalumab 01/13/2022 -CTs at [facility] 02/14/2022-fine hypoattenuating masses in the left and right hepatic lobes, more prominent compared to the 12/15/2021 MRI, potentially representing multifocal cholangiocarcinoma, persistent occlusion of left portal and anterior branch of the right portal vein, no extrahepatic lymphadenopathy or metastatic  disease -MRI liver  12/19 /2023-1.3 x 0.8 cm hypoenhancing lesion in segment 8, ill-defined area of delayed enhancement in the caudate measuring 1.7 cm, no left liver lesion, 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  Obstructive jaundice -08/16/2021 percutaneous biliary drain placement 3.  Hepatic steatosis 4.  GERD 5.  Hypertension 6.  Normocytic anemia 7.  Enlarged prostate seen on CT 8.  Tobacco use 9.  Enterococcus and Aerococcus bacteremia 08/20/2021-discharged 08/26/2021 to complete outpatient course of Augmentin 10.  Peripheral neuropathy 11.  Port-A-Cath placement 09/23/2021 12.  Biliary drain exchange 09/23/2021; drain capped 10/12/2021 13.  Rigors following biliary drain procedure 09/23/2021, biliary drain culture-Enterobacter Cloacae, Bactrim x7 days 09/27/2021 14.  Right leg edema and pain 10/18/2021-Doppler negative for DVT 15.  Admission 03/18/2022 with a right upper quadrant abscess, status post drain placement, culture positive for Klebsiella CTs 03/21/2022-no change in moderate volume ascites, interval decrease in subhepatic air-filled collection with catheter in appropriate position, resolved right pneumothorax, no change in bilateral pleural effusions-moderate on right and small on left CTs 04/25/2022 at [facility]-3.1 x 2.0 cm fluid collection in the abdomen and pelvis, decreased in volume and increased in organization; resolution of subhepatic collection; increased partially visualized large right pleural effusion with near complete collapse of the right lower  lobe. Drainage catheter removed 04/25/2022 16.  Large right pleural effusion on CT 04/25/2022-thoracentesis 04/29/2022, 1.75 L of pleural fluid removed; culture negative, cytology with reactive mesothelial cells present, acute inflammation    Disposition: Jeffrey Blevins appears stable.  He is tolerating the durvalumab well.  There is no clinical evidence for recurrence of the right pleural effusion.  I doubt the skin rash is related to durvalumab, though he may have dryness and pruritus from durvalumab.  He will try a moisturizer.  He will complete another treatment today.  Jeffrey Blevins will return for an office visit and durvalumab in 4 weeks.  We will plan for a restaging CT evaluation in 3-4 months.  Betsy Coder, MD  06/01/2022  10:00 AM

## 2022-06-01 NOTE — Progress Notes (Signed)
57 yo male diagnosed with Cholangiocarcinoma S/p thoracentesis and followed by Dr. Benay Spice.  PMH includes GERD, Hiatal Hernia, Barrett's Esophagus, Esophageal Dilatation, HTN, Raynaud's disease.  Medications include Nexium, Ativan, Zofran, and Compazine.  Labs reviewed.  Height: 63 inches. Weight: 186 pounds  UBW: 200 pounds  BMI: 23.25.  Patient feels well. He denies diarrhea. He experience GERD. He takes Nexium which helps. He drinks one carton of [employer] Nutrition Plan daily providing 150 cal and 30 gm protein. Denies problems at this time. He is happy at current weight. Denies questions or concerns.  Nutrition Diagnosis: Unintended wt loss related to cancer as evidenced by 7% wt loss from usual body weight.  Intervention: Educated to continue small frequent meals and snacks with adequate calories and protein for wt maintenance. Continue ONS as desired. Educated on strategies to minimize GERD.  Monitoring, Evaluation, Goals:  Tolerate oral intake for wt maintenance  No follow up scheduled at this time. Happy to see patient as needed.

## 2022-06-01 NOTE — Patient Instructions (Signed)

## 2022-06-01 NOTE — Patient Instructions (Signed)
[facility]   Discharge Instructions: Thank you for choosing [facility] to provide your oncology and hematology care.   If you have a lab appointment with the Cancer Center, please go directly to the Cancer Center and check in at the registration area.   Wear comfortable clothing and clothing appropriate for easy access to any Portacath or PICC line.   We strive to give you quality time with your provider. You may need to reschedule your appointment if you arrive late (15 or more minutes).  Arriving late affects you and other patients whose appointments are after yours.  Also, if you miss three or more appointments without notifying the office, you may be dismissed from the clinic at the provider's discretion.      For prescription refill requests, have your pharmacy contact our office and allow 72 hours for refills to be completed.    Today you received the following chemotherapy and/or immunotherapy agents Imfinzi      To help prevent nausea and vomiting after your treatment, we encourage you to take your nausea medication as directed.  BELOW ARE SYMPTOMS THAT SHOULD BE REPORTED IMMEDIATELY: *FEVER GREATER THAN 100.4 F (38 C) OR HIGHER *CHILLS OR SWEATING *NAUSEA AND VOMITING THAT IS NOT CONTROLLED WITH YOUR NAUSEA MEDICATION *UNUSUAL SHORTNESS OF BREATH *UNUSUAL BRUISING OR BLEEDING *URINARY PROBLEMS (pain or burning when urinating, or frequent urination) *BOWEL PROBLEMS (unusual diarrhea, constipation, pain near the anus) TENDERNESS IN MOUTH AND THROAT WITH OR WITHOUT PRESENCE OF ULCERS (sore throat, sores in mouth, or a toothache) UNUSUAL RASH, SWELLING OR PAIN  UNUSUAL VAGINAL DISCHARGE OR ITCHING   Items with * indicate a potential emergency and should be followed up as soon as possible or go to the Emergency Department if any problems should occur.  Please show the CHEMOTHERAPY ALERT CARD or IMMUNOTHERAPY ALERT CARD at check-in  to the Emergency Department and triage nurse.  Should you have questions after your visit or need to cancel or reschedule your appointment, please contact [facility]  Dept: [phone]  and follow the prompts.  Office hours are 8:00 a.m. to 4:30 p.m. Monday - Friday. Please note that voicemails left after 4:00 p.m. may not be returned until the following business day.  We are closed weekends and major holidays. You have access to a nurse at all times for urgent questions. Please call the main number to the clinic Dept: [phone] and follow the prompts.   For any non-urgent questions, you may also contact your provider using MyChart. We now offer e-Visits for anyone 18 and older to request care online for non-urgent symptoms. For details visit [email or web address].   Also download the MyChart app! Go to the app store, search "MyChart", open the app, select [facility], and log in with your MyChart username and password.  Durvalumab Injection What is this medication? DURVALUMAB (dur VAL ue mab) treats some types of cancer. It works by helping your immune system slow or stop the spread of cancer cells. It is a monoclonal antibody. This medicine may be used for other purposes; ask your health care provider or pharmacist if you have questions. COMMON BRAND NAME(S): IMFINZI What should I tell my care team before I take this medication? They need to know if you have any of these conditions: Allogeneic stem cell transplant (uses someone else's stem cells) Autoimmune diseases, such as Crohn disease, ulcerative colitis, lupus History of chest radiation Nervous system problems,   such as Guillain-Barre syndrome, myasthenia gravis Organ transplant An unusual or allergic reaction to durvalumab, other medications, foods, dyes, or preservatives Pregnant or trying to get pregnant Breast-feeding How should I use this medication? This medication is infused into a vein.  It is given by your care team in a hospital or clinic setting. A special MedGuide will be given to you before each treatment. Be sure to read this information carefully each time. Talk to your care team about the use of this medication in children. Special care may be needed. Overdosage: If you think you have taken too much of this medicine contact a poison control center or emergency room at once. NOTE: This medicine is only for you. Do not share this medicine with others. What if I miss a dose? Keep appointments for follow-up doses. It is important not to miss your dose. Call your care team if you are unable to keep an appointment. What may interact with this medication? Interactions have not been studied. This list may not describe all possible interactions. Give your health care provider a list of all the medicines, herbs, non-prescription drugs, or dietary supplements you use. Also tell them if you smoke, drink alcohol, or use illegal drugs. Some items may interact with your medicine. What should I watch for while using this medication? Your condition will be monitored carefully while you are receiving this medication. You may need blood work while taking this medication. This medication may cause serious skin reactions. They can happen weeks to months after starting the medication. Contact your care team right away if you notice fevers or flu-like symptoms with a rash. The rash may be red or purple and then turn into blisters or peeling of the skin. You may also notice a red rash with swelling of the face, lips, or lymph nodes in your neck or under your arms. Tell your care team right away if you have any change in your eyesight. Talk to your care team if you may be pregnant. Serious birth defects can occur if you take this medication during pregnancy and for 3 months after the last dose. You will need a negative pregnancy test before starting this medication. Contraception is recommended while  taking this medication and for 3 months after the last dose. Your care team can help you find the option that works for you. Do not breastfeed while taking this medication and for 3 months after the last dose. What side effects may I notice from receiving this medication? Side effects that you should report to your care team as soon as possible: Allergic reactions--skin rash, itching, hives, swelling of the face, lips, tongue, or throat Dry cough, shortness of breath or trouble breathing Eye pain, redness, irritation, or discharge with blurry or decreased vision Heart muscle inflammation--unusual weakness or fatigue, shortness of breath, chest pain, fast or irregular heartbeat, dizziness, swelling of the ankles, feet, or hands Hormone gland problems--headache, sensitivity to light, unusual weakness or fatigue, dizziness, fast or irregular heartbeat, increased sensitivity to cold or heat, excessive sweating, constipation, hair loss, increased thirst or amount of urine, tremors or shaking, irritability Infusion reactions--chest pain, shortness of breath or trouble breathing, feeling faint or lightheaded Kidney injury (glomerulonephritis)--decrease in the amount of urine, red or dark brown urine, foamy or bubbly urine, swelling of the ankles, hands, or feet Liver injury--right upper belly pain, loss of appetite, nausea, light-colored stool, dark yellow or brown urine, yellowing skin or eyes, unusual weakness or fatigue Pain, tingling, or numbness in   the hands or feet, muscle weakness, change in vision, confusion or trouble speaking, loss of balance or coordination, trouble walking, seizures Rash, fever, and swollen lymph nodes Redness, blistering, peeling, or loosening of the skin, including inside the mouth Sudden or severe stomach pain, bloody diarrhea, fever, nausea, vomiting Side effects that usually do not require medical attention (report these to your care team if they continue or are  bothersome): Bone, joint, or muscle pain Diarrhea Fatigue Loss of appetite Nausea Skin rash This list may not describe all possible side effects. Call your doctor for medical advice about side effects. You may report side effects to FDA at 1-800-FDA-1088. Where should I keep my medication? This medication is given in a hospital or clinic. It will not be stored at home. NOTE: This sheet is a summary. It may not cover all possible information. If you have questions about this medicine, talk to your doctor, pharmacist, or health care provider.  2023 Elsevier/Gold Standard (2021-06-18 00:00:00)   

## 2022-06-01 NOTE — Progress Notes (Signed)
Patient seen by Dr. Benay Spice today  Vitals are within treatment parameters.No intervention for BP 142/87  Labs reviewed by Dr. Benay Spice and are within treatment parameters.  Per physician team, patient is ready for treatment and there are NO modifications to the treatment plan.

## 2022-06-02 ENCOUNTER — Other Ambulatory Visit: Payer: Self-pay

## 2022-06-02 LAB — T4: T4, Total: 6.7 ug/dL (ref 4.5–12.0)

## 2022-06-14 ENCOUNTER — Other Ambulatory Visit: Payer: Self-pay

## 2022-06-17 ENCOUNTER — Other Ambulatory Visit: Payer: Self-pay

## 2022-06-26 ENCOUNTER — Other Ambulatory Visit: Payer: Self-pay | Admitting: Oncology

## 2022-06-29 ENCOUNTER — Inpatient Hospital Stay: Payer: Managed Care, Other (non HMO)

## 2022-06-29 ENCOUNTER — Inpatient Hospital Stay: Payer: Managed Care, Other (non HMO) | Admitting: Nurse Practitioner

## 2022-06-29 ENCOUNTER — Inpatient Hospital Stay: Payer: Managed Care, Other (non HMO) | Attending: Oncology

## 2022-06-29 ENCOUNTER — Encounter: Payer: Self-pay | Admitting: Nurse Practitioner

## 2022-06-29 VITALS — BP 151/96 | HR 60 | Resp 18

## 2022-06-29 VITALS — BP 144/86 | HR 64 | Temp 98.5°F | Resp 18 | Ht 75.0 in | Wt 191.9 lb

## 2022-06-29 DIAGNOSIS — C221 Intrahepatic bile duct carcinoma: Secondary | ICD-10-CM

## 2022-06-29 DIAGNOSIS — Z79620 Long term (current) use of immunosuppressive biologic: Secondary | ICD-10-CM | POA: Diagnosis not present

## 2022-06-29 DIAGNOSIS — Z5112 Encounter for antineoplastic immunotherapy: Secondary | ICD-10-CM | POA: Insufficient documentation

## 2022-06-29 LAB — CMP (CANCER CENTER ONLY)
ALT: 20 U/L (ref 0–44)
AST: 21 U/L (ref 15–41)
Albumin: 3.9 g/dL (ref 3.5–5.0)
Alkaline Phosphatase: 96 U/L (ref 38–126)
Anion gap: 7 (ref 5–15)
BUN: 18 mg/dL (ref 6–20)
CO2: 25 mmol/L (ref 22–32)
Calcium: 9.1 mg/dL (ref 8.9–10.3)
Chloride: 105 mmol/L (ref 98–111)
Creatinine: 0.83 mg/dL (ref 0.61–1.24)
GFR, Estimated: >60 mL/min (ref >60)
Glucose, Bld: 86 mg/dL (ref 70–99)
Potassium: 4.3 mmol/L (ref 3.5–5.1)
Sodium: 137 mmol/L (ref 135–145)
Total Bilirubin: 0.4 mg/dL (ref 0.3–1.2)
Total Protein: 6.8 g/dL (ref 6.5–8.1)

## 2022-06-29 LAB — CBC WITH DIFFERENTIAL (CANCER CENTER ONLY)
Abs Immature Granulocytes: 0.01 10*3/uL (ref 0.00–0.07)
Basophils Absolute: 0.1 10*3/uL (ref 0.0–0.1)
Basophils Relative: 1 %
Eosinophils Absolute: 0.4 10*3/uL (ref 0.0–0.5)
Eosinophils Relative: 6 %
HCT: 37.8 % — ABNORMAL LOW (ref 39.0–52.0)
Hemoglobin: 12.2 g/dL — ABNORMAL LOW (ref 13.0–17.0)
Immature Granulocytes: 0 %
Lymphocytes Relative: 38 %
Lymphs Abs: 2.2 10*3/uL (ref 0.7–4.0)
MCH: 26.6 pg (ref 26.0–34.0)
MCHC: 32.3 g/dL (ref 30.0–36.0)
MCV: 82.4 fL (ref 80.0–100.0)
Monocytes Absolute: 0.4 10*3/uL (ref 0.1–1.0)
Monocytes Relative: 7 %
Neutro Abs: 2.9 10*3/uL (ref 1.7–7.7)
Neutrophils Relative %: 48 %
Platelet Count: 189 10*3/uL (ref 150–400)
RBC: 4.59 MIL/uL (ref 4.22–5.81)
RDW: 17.3 % — ABNORMAL HIGH (ref 11.5–15.5)
WBC Count: 5.9 10*3/uL (ref 4.0–10.5)
nRBC: 0 % (ref 0.0–0.2)

## 2022-06-29 MED ORDER — SODIUM CHLORIDE 0.9 % IV SOLN
1500.0000 mg | Freq: Once | INTRAVENOUS | Status: AC
  Administered 2022-06-29: 1500 mg via INTRAVENOUS
  Filled 2022-06-29: qty 30

## 2022-06-29 MED ORDER — HEPARIN SOD (PORK) LOCK FLUSH 100 UNIT/ML IV SOLN
500.0000 [IU] | Freq: Once | INTRAVENOUS | Status: AC | PRN
  Administered 2022-06-29: 500 [IU]

## 2022-06-29 MED ORDER — SODIUM CHLORIDE 0.9% FLUSH
10.0000 mL | INTRAVENOUS | Status: DC | PRN
  Administered 2022-06-29: 10 mL

## 2022-06-29 MED ORDER — ESOMEPRAZOLE MAGNESIUM 20 MG PO CPDR
20.0000 mg | DELAYED_RELEASE_CAPSULE | Freq: Every morning | ORAL | 3 refills | Status: DC
Start: 2022-06-29 — End: 2023-04-06

## 2022-06-29 MED ORDER — SODIUM CHLORIDE 0.9 % IV SOLN: Freq: Once | INTRAVENOUS | Status: AC

## 2022-06-29 NOTE — Patient Instructions (Signed)
[facility]   Discharge Instructions: Thank you for choosing [facility] to provide your oncology and hematology care.   If you have a lab appointment with the Cancer Center, please go directly to the Cancer Center and check in at the registration area.   Wear comfortable clothing and clothing appropriate for easy access to any Portacath or PICC line.   We strive to give you quality time with your provider. You may need to reschedule your appointment if you arrive late (15 or more minutes).  Arriving late affects you and other patients whose appointments are after yours.  Also, if you miss three or more appointments without notifying the office, you may be dismissed from the clinic at the provider's discretion.      For prescription refill requests, have your pharmacy contact our office and allow 72 hours for refills to be completed.    Today you received the following chemotherapy and/or immunotherapy agents Durvalumab (IMFINZI).      To help prevent nausea and vomiting after your treatment, we encourage you to take your nausea medication as directed.  BELOW ARE SYMPTOMS THAT SHOULD BE REPORTED IMMEDIATELY: *FEVER GREATER THAN 100.4 F (38 C) OR HIGHER *CHILLS OR SWEATING *NAUSEA AND VOMITING THAT IS NOT CONTROLLED WITH YOUR NAUSEA MEDICATION *UNUSUAL SHORTNESS OF BREATH *UNUSUAL BRUISING OR BLEEDING *URINARY PROBLEMS (pain or burning when urinating, or frequent urination) *BOWEL PROBLEMS (unusual diarrhea, constipation, pain near the anus) TENDERNESS IN MOUTH AND THROAT WITH OR WITHOUT PRESENCE OF ULCERS (sore throat, sores in mouth, or a toothache) UNUSUAL RASH, SWELLING OR PAIN  UNUSUAL VAGINAL DISCHARGE OR ITCHING   Items with * indicate a potential emergency and should be followed up as soon as possible or go to the Emergency Department if any problems should occur.  Please show the CHEMOTHERAPY ALERT CARD or IMMUNOTHERAPY ALERT  CARD at check-in to the Emergency Department and triage nurse.  Should you have questions after your visit or need to cancel or reschedule your appointment, please contact [facility]  Dept: [phone]  and follow the prompts.  Office hours are 8:00 a.m. to 4:30 p.m. Monday - Friday. Please note that voicemails left after 4:00 p.m. may not be returned until the following business day.  We are closed weekends and major holidays. You have access to a nurse at all times for urgent questions. Please call the main number to the clinic Dept: [phone] and follow the prompts.   For any non-urgent questions, you may also contact your provider using MyChart. We now offer e-Visits for anyone 18 and older to request care online for non-urgent symptoms. For details visit [email or web address].   Also download the MyChart app! Go to the app store, search "MyChart", open the app, select , and log in with your MyChart username and password.  Durvalumab Injection What is this medication? DURVALUMAB (dur VAL ue mab) treats some types of cancer. It works by helping your immune system slow or stop the spread of cancer cells. It is a monoclonal antibody. This medicine may be used for other purposes; ask your health care provider or pharmacist if you have questions. COMMON BRAND NAME(S): IMFINZI What should I tell my care team before I take this medication? They need to know if you have any of these conditions: Allogeneic stem cell transplant (uses someone else's stem cells) Autoimmune diseases, such as Crohn disease, ulcerative colitis, lupus History of chest radiation Nervous system   problems, such as Guillain-Barre syndrome, myasthenia gravis Organ transplant An unusual or allergic reaction to durvalumab, other medications, foods, dyes, or preservatives Pregnant or trying to get pregnant Breast-feeding How should I use this medication? This medication is  infused into a vein. It is given by your care team in a hospital or clinic setting. A special MedGuide will be given to you before each treatment. Be sure to read this information carefully each time. Talk to your care team about the use of this medication in children. Special care may be needed. Overdosage: If you think you have taken too much of this medicine contact a poison control center or emergency room at once. NOTE: This medicine is only for you. Do not share this medicine with others. What if I miss a dose? Keep appointments for follow-up doses. It is important not to miss your dose. Call your care team if you are unable to keep an appointment. What may interact with this medication? Interactions have not been studied. This list may not describe all possible interactions. Give your health care provider a list of all the medicines, herbs, non-prescription drugs, or dietary supplements you use. Also tell them if you smoke, drink alcohol, or use illegal drugs. Some items may interact with your medicine. What should I watch for while using this medication? Your condition will be monitored carefully while you are receiving this medication. You may need blood work while taking this medication. This medication may cause serious skin reactions. They can happen weeks to months after starting the medication. Contact your care team right away if you notice fevers or flu-like symptoms with a rash. The rash may be red or purple and then turn into blisters or peeling of the skin. You may also notice a red rash with swelling of the face, lips, or lymph nodes in your neck or under your arms. Tell your care team right away if you have any change in your eyesight. Talk to your care team if you may be pregnant. Serious birth defects can occur if you take this medication during pregnancy and for 3 months after the last dose. You will need a negative pregnancy test before starting this medication. Contraception  is recommended while taking this medication and for 3 months after the last dose. Your care team can help you find the option that works for you. Do not breastfeed while taking this medication and for 3 months after the last dose. What side effects may I notice from receiving this medication? Side effects that you should report to your care team as soon as possible: Allergic reactions--skin rash, itching, hives, swelling of the face, lips, tongue, or throat Dry cough, shortness of breath or trouble breathing Eye pain, redness, irritation, or discharge with blurry or decreased vision Heart muscle inflammation--unusual weakness or fatigue, shortness of breath, chest pain, fast or irregular heartbeat, dizziness, swelling of the ankles, feet, or hands Hormone gland problems--headache, sensitivity to light, unusual weakness or fatigue, dizziness, fast or irregular heartbeat, increased sensitivity to cold or heat, excessive sweating, constipation, hair loss, increased thirst or amount of urine, tremors or shaking, irritability Infusion reactions--chest pain, shortness of breath or trouble breathing, feeling faint or lightheaded Kidney injury (glomerulonephritis)--decrease in the amount of urine, red or dark brown urine, foamy or bubbly urine, swelling of the ankles, hands, or feet Liver injury--right upper belly pain, loss of appetite, nausea, light-colored stool, dark yellow or brown urine, yellowing skin or eyes, unusual weakness or fatigue Pain, tingling, or numbness   in the hands or feet, muscle weakness, change in vision, confusion or trouble speaking, loss of balance or coordination, trouble walking, seizures Rash, fever, and swollen lymph nodes Redness, blistering, peeling, or loosening of the skin, including inside the mouth Sudden or severe stomach pain, bloody diarrhea, fever, nausea, vomiting Side effects that usually do not require medical attention (report these to your care team if they  continue or are bothersome): Bone, joint, or muscle pain Diarrhea Fatigue Loss of appetite Nausea Skin rash This list may not describe all possible side effects. Call your doctor for medical advice about side effects. You may report side effects to FDA at 1-800-FDA-1088. Where should I keep my medication? This medication is given in a hospital or clinic. It will not be stored at home. NOTE: This sheet is a summary. It may not cover all possible information. If you have questions about this medicine, talk to your doctor, pharmacist, or health care provider.  2023 Elsevier/Gold Standard (2021-06-18 00:00:00)

## 2022-06-29 NOTE — Progress Notes (Signed)
Patient seen by Lisa Thomas NP today  Vitals are within treatment parameters.  Labs reviewed by Lisa Thomas NP and are within treatment parameters.  Per physician team, patient is ready for treatment and there are NO modifications to the treatment plan.     

## 2022-06-29 NOTE — Progress Notes (Signed)
[facility] OFFICE PROGRESS NOTE   Diagnosis: Cholangiocarcinoma  INTERVAL HISTORY:   Jeffrey Blevins returns as scheduled.  He completed a cycle of Durvalumab 06/01/2022.  No rash or diarrhea.  No change in baseline joint pain.  No nausea or vomiting.  Minor discomfort at the right abdomen.  Appetite is good.  Objective:  Vital signs in last 24 hours:  Blood pressure (!) 144/86, pulse 64, temperature 98.5 F (36.9 C), temperature source Oral, resp. rate 18, height 6\' 3"  (1.905 m), weight 191 lb 14.4 oz (87 kg), SpO2 100 %.    Resp: Lungs clear bilaterally. Cardio: Regular rate and rhythm. GI: Mild tenderness right abdomen surrounding the surgical incision.  No erythema. Vascular: No leg edema. Skin: No rash. Port-A-Cath without erythema.  Lab Results:  Lab Results  Component Value Date   WBC 5.9 06/29/2022   HGB 12.2 (L) 06/29/2022   HCT 37.8 (L) 06/29/2022   MCV 82.4 06/29/2022   PLT 189 06/29/2022   NEUTROABS 2.9 06/29/2022    Imaging:  No results found.  Medications: I have reviewed the patient's current medications.  Assessment/Plan: Liver mass concerning for malignancy -CT abdomen/pelvis with contrast 08/14/1998 23-2.9 x 2.7 x 2.7 cm hypoenhancing mass in segment 5 near the liver hilum just above hepatic ductal confluence suspicious for primary bile duct malignancy or primary hepatic malignancy -MRCP 08/14/2021-irregular mass of segment 8 of the liver with perilesional enhancement concerning for cholangiocarcinoma -08/14/2021 CA 19.9 was elevated at 76 -08/16/2021 AFP 6.4 -08/16/2021 cytology from bile duct brushing showed atypical cells -08/19/2021 cytology from bile duct brushing showed cells suspicious for malignancy -08/25/2021 CT-guided liver biopsy-moderately differentiated adenocarcinoma, CK7 positive, CDX2 positive in a small population of cells, TTF-1 negative, CK20 negative-consistent with intrahepatic cholangiocarcinoma versus metastatic disease  from a pancreaticobiliary primary -CTs at [facility] 09/06/2021-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, occlusion of the anterior branch of the right portal vein, periportal enhancement and thickening on delayed imaging potentially due to tumor infiltration, no change in the 2 x 3.2 cm mass in segment 8, mild to moderate left and right intrahepatic biliary dilatation, biliary stent in place, small portacaval and periportal nodes, no evidence of metastatic disease in the chest -Cycle 1 gemcitabine/cisplatin/durvalumab 10/01/2021 -Cycle 2 gemcitabine/cisplatin/durvalumab 10/22/2021 -Cycle 3 gemcitabine/cisplatin/durvalumab 11/11/2021, D1 cisplatin held due to neuropathy, cisplatin resumed day 8 -Cycle 4 gemcitabine/cisplatin/durvalumab 12/03/2021 -MRI abdomen 12/15/2021-segment 8 cholangiocarcinoma no longer measurable, residual biliary duct dilation in the anterior right and left hepatic lobes, no evidence of metastatic disease persistent anterior right and new peripheral left portal vein occlusion -Cycle 5 gemcitabine/cisplatin/durvalumab 12/24/2021 -Cycle 6 gemcitabine/cisplatin/durvalumab 01/13/2022 -CTs at [facility] 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  Obstructive jaundice -08/16/2021 percutaneous biliary drain placement 3.  Hepatic steatosis 4.  GERD 5.  Hypertension 6.  Normocytic anemia 7.  Enlarged prostate seen on CT 8.  Tobacco use 9.  Enterococcus and Aerococcus bacteremia 08/20/2021-discharged 08/26/2021 to complete outpatient course of Augmentin 10.  Peripheral neuropathy 11.  Port-A-Cath placement 09/23/2021 12.  Biliary drain exchange 09/23/2021; drain capped 10/12/2021 13.  Rigors following biliary drain procedure 09/23/2021, biliary drain culture-Enterobacter Cloacae, Bactrim x7 days 09/27/2021 14.  Right leg edema and pain 10/18/2021-Doppler negative for DVT 15.  Admission 03/18/2022 with a right upper quadrant abscess, status post drain placement, culture positive for Klebsiella CTs 03/21/2022-no change in moderate volume ascites, interval decrease in subhepatic air-filled collection with catheter in appropriate position, resolved right pneumothorax, no change in bilateral pleural effusions-moderate on right and small on left CTs 04/25/2022 at [facility]-3.1 x 2.0 cm fluid collection in the abdomen and pelvis, decreased in volume and increased in organization; resolution of subhepatic collection; increased partially visualized large right pleural effusion with near complete collapse of the right lower lobe. Drainage catheter removed 04/25/2022 16.  Large right pleural effusion on CT 04/25/2022-thoracentesis 04/29/2022, 1.75 L of pleural fluid removed; culture negative, cytology with reactive mesothelial cells present, acute inflammation    Disposition: Jeffrey Blevins appears stable.  He is currently on treatment with Durvalumab every 4 weeks.  He is tolerating well.  Plan to continue the same.  CBC and chemistry panel reviewed.  Labs adequate to proceed with Durvalumab.  He will return for lab,  follow-up, Durvalumab in 4 weeks.  We are available to see him sooner if needed.  Lonna Cobb ANP/GNP-BC   06/29/2022  10:42 AM

## 2022-06-30 ENCOUNTER — Other Ambulatory Visit: Payer: Self-pay

## 2022-07-03 ENCOUNTER — Other Ambulatory Visit: Payer: Self-pay

## 2022-07-08 ENCOUNTER — Other Ambulatory Visit: Payer: Self-pay | Admitting: Oncology

## 2022-07-11 ENCOUNTER — Telehealth: Payer: Self-pay

## 2022-07-11 ENCOUNTER — Other Ambulatory Visit: Payer: Self-pay | Admitting: Nurse Practitioner

## 2022-07-11 ENCOUNTER — Other Ambulatory Visit: Payer: Self-pay

## 2022-07-11 DIAGNOSIS — C221 Intrahepatic bile duct carcinoma: Secondary | ICD-10-CM

## 2022-07-11 NOTE — Telephone Encounter (Signed)
Mrs. Colato contacted our office to request that Misty Stanley place an order for a CT scan for the patient. The patient is experiencing cognitive difficulties, specifically memory lapses while driving. Additionally, Mrs. Bradeen mentioned that the patient's health insurance will be ending at the beginning of June. I advised the patient's spouse that it would be best for the patient to refrain from driving at this time.

## 2022-07-22 ENCOUNTER — Ambulatory Visit (HOSPITAL_BASED_OUTPATIENT_CLINIC_OR_DEPARTMENT_OTHER)
Admission: RE | Admit: 2022-07-22 | Discharge: 2022-07-22 | Disposition: A | Discharge status: Home / Self Care | Payer: Managed Care, Other (non HMO) | Source: Ambulatory Visit | Attending: Nurse Practitioner | Admitting: Nurse Practitioner

## 2022-07-22 DIAGNOSIS — C221 Intrahepatic bile duct carcinoma: Secondary | ICD-10-CM | POA: Insufficient documentation

## 2022-07-22 MED ORDER — HEPARIN SOD (PORK) LOCK FLUSH 100 UNIT/ML IV SOLN
500.0000 [IU] | Freq: Once | INTRAVENOUS | Status: AC
  Administered 2022-07-22: 500 [IU] via INTRAVENOUS

## 2022-07-22 MED ORDER — SODIUM CHLORIDE 0.9% FLUSH: 10.0000 mL | INTRAVENOUS | Status: DC | PRN

## 2022-07-22 MED ORDER — IOHEXOL 300 MG/ML  SOLN
100.0000 mL | Freq: Once | INTRAMUSCULAR | Status: AC | PRN
  Administered 2022-07-22: 80 mL via INTRAVENOUS

## 2022-07-24 ENCOUNTER — Other Ambulatory Visit: Payer: Self-pay | Admitting: Oncology

## 2022-07-24 ENCOUNTER — Other Ambulatory Visit: Payer: Self-pay

## 2022-07-27 ENCOUNTER — Inpatient Hospital Stay: Payer: Managed Care, Other (non HMO)

## 2022-07-27 ENCOUNTER — Encounter: Payer: Self-pay | Admitting: Nurse Practitioner

## 2022-07-27 ENCOUNTER — Inpatient Hospital Stay: Payer: Managed Care, Other (non HMO) | Admitting: Nurse Practitioner

## 2022-07-27 VITALS — BP 144/90 | HR 68 | Temp 98.2°F | Resp 20 | Ht 75.0 in | Wt 196.3 lb

## 2022-07-27 DIAGNOSIS — Z5112 Encounter for antineoplastic immunotherapy: Secondary | ICD-10-CM | POA: Diagnosis not present

## 2022-07-27 DIAGNOSIS — Z95828 Presence of other vascular implants and grafts: Secondary | ICD-10-CM

## 2022-07-27 DIAGNOSIS — C221 Intrahepatic bile duct carcinoma: Secondary | ICD-10-CM | POA: Diagnosis not present

## 2022-07-27 LAB — CBC WITH DIFFERENTIAL (CANCER CENTER ONLY)
Abs Immature Granulocytes: 0.01 10*3/uL (ref 0.00–0.07)
Basophils Absolute: 0 10*3/uL (ref 0.0–0.1)
Basophils Relative: 1 %
Eosinophils Absolute: 0.3 10*3/uL (ref 0.0–0.5)
Eosinophils Relative: 5 %
HCT: 37.8 % — ABNORMAL LOW (ref 39.0–52.0)
Hemoglobin: 13.1 g/dL (ref 13.0–17.0)
Immature Granulocytes: 0 %
Lymphocytes Relative: 35 %
Lymphs Abs: 2.2 10*3/uL (ref 0.7–4.0)
MCH: 27.8 pg (ref 26.0–34.0)
MCHC: 34.7 g/dL (ref 30.0–36.0)
MCV: 80.3 fL (ref 80.0–100.0)
Monocytes Absolute: 0.5 10*3/uL (ref 0.1–1.0)
Monocytes Relative: 7 %
Neutro Abs: 3.3 10*3/uL (ref 1.7–7.7)
Neutrophils Relative %: 52 %
Platelet Count: 179 10*3/uL (ref 150–400)
RBC: 4.71 MIL/uL (ref 4.22–5.81)
RDW: 15.1 % (ref 11.5–15.5)
WBC Count: 6.3 10*3/uL (ref 4.0–10.5)
nRBC: 0 % (ref 0.0–0.2)

## 2022-07-27 LAB — CMP (CANCER CENTER ONLY)
ALT: 17 U/L (ref 0–44)
AST: 21 U/L (ref 15–41)
Albumin: 4.1 g/dL (ref 3.5–5.0)
Alkaline Phosphatase: 96 U/L (ref 38–126)
Anion gap: 7 (ref 5–15)
BUN: 22 mg/dL — ABNORMAL HIGH (ref 6–20)
CO2: 25 mmol/L (ref 22–32)
Calcium: 9.5 mg/dL (ref 8.9–10.3)
Chloride: 106 mmol/L (ref 98–111)
Creatinine: 0.89 mg/dL (ref 0.61–1.24)
GFR, Estimated: >60 mL/min (ref >60)
Glucose, Bld: 99 mg/dL (ref 70–99)
Potassium: 4 mmol/L (ref 3.5–5.1)
Sodium: 138 mmol/L (ref 135–145)
Total Bilirubin: 0.5 mg/dL (ref 0.3–1.2)
Total Protein: 7 g/dL (ref 6.5–8.1)

## 2022-07-27 LAB — TSH: TSH: 3.877 u[IU]/mL (ref 0.350–4.500)

## 2022-07-27 MED ORDER — HEPARIN SOD (PORK) LOCK FLUSH 100 UNIT/ML IV SOLN
500.0000 [IU] | Freq: Once | INTRAVENOUS | Status: AC | PRN
  Administered 2022-07-27: 500 [IU]

## 2022-07-27 MED ORDER — SODIUM CHLORIDE 0.9% FLUSH
10.0000 mL | INTRAVENOUS | Status: DC | PRN
  Administered 2022-07-27: 10 mL

## 2022-07-27 MED ORDER — SODIUM CHLORIDE 0.9 % IV SOLN
1500.0000 mg | Freq: Once | INTRAVENOUS | Status: AC
  Administered 2022-07-27: 1500 mg via INTRAVENOUS
  Filled 2022-07-27: qty 30

## 2022-07-27 MED ORDER — SODIUM CHLORIDE 0.9% FLUSH
10.0000 mL | INTRAVENOUS | Status: DC | PRN
  Administered 2022-07-27: 10 mL via INTRAVENOUS

## 2022-07-27 MED ORDER — SODIUM CHLORIDE 0.9 % IV SOLN: Freq: Once | INTRAVENOUS | Status: AC

## 2022-07-27 NOTE — Patient Instructions (Signed)
[facility]  Discharge Instructions: Thank you for choosing [facility] to provide your oncology and hematology care.   If you have a lab appointment with the Cancer Center, please go directly to the Cancer Center and check in at the registration area.   Wear comfortable clothing and clothing appropriate for easy access to any Portacath or PICC line.   We strive to give you quality time with your provider. You may need to reschedule your appointment if you arrive late (15 or more minutes).  Arriving late affects you and other patients whose appointments are after yours.  Also, if you miss three or more appointments without notifying the office, you may be dismissed from the clinic at the provider's discretion.      For prescription refill requests, have your pharmacy contact our office and allow 72 hours for refills to be completed.    Today you received the following chemotherapy and/or immunotherapy agents Imfinzi      To help prevent nausea and vomiting after your treatment, we encourage you to take your nausea medication as directed.  BELOW ARE SYMPTOMS THAT SHOULD BE REPORTED IMMEDIATELY: *FEVER GREATER THAN 100.4 F (38 C) OR HIGHER *CHILLS OR SWEATING *NAUSEA AND VOMITING THAT IS NOT CONTROLLED WITH YOUR NAUSEA MEDICATION *UNUSUAL SHORTNESS OF BREATH *UNUSUAL BRUISING OR BLEEDING *URINARY PROBLEMS (pain or burning when urinating, or frequent urination) *BOWEL PROBLEMS (unusual diarrhea, constipation, pain near the anus) TENDERNESS IN MOUTH AND THROAT WITH OR WITHOUT PRESENCE OF ULCERS (sore throat, sores in mouth, or a toothache) UNUSUAL RASH, SWELLING OR PAIN  UNUSUAL VAGINAL DISCHARGE OR ITCHING   Items with * indicate a potential emergency and should be followed up as soon as possible or go to the Emergency Department if any problems should occur.  Please show the CHEMOTHERAPY ALERT CARD or IMMUNOTHERAPY ALERT CARD at check-in  to the Emergency Department and triage nurse.  Should you have questions after your visit or need to cancel or reschedule your appointment, please contact [facility]  Dept: [phone]  and follow the prompts.  Office hours are 8:00 a.m. to 4:30 p.m. Monday - Friday. Please note that voicemails left after 4:00 p.m. may not be returned until the following business day.  We are closed weekends and major holidays. You have access to a nurse at all times for urgent questions. Please call the main number to the clinic Dept: [phone] and follow the prompts.   For any non-urgent questions, you may also contact your provider using MyChart. We now offer e-Visits for anyone 1 and older to request care online for non-urgent symptoms. For details visit [email or web address].   Also download the MyChart app! Go to the app store, search "MyChart", open the app, select [facility], and log in with your MyChart username and password.

## 2022-07-27 NOTE — Progress Notes (Signed)
Patient seen by Lisa Thomas NP today  Vitals are within treatment parameters.  Labs reviewed by Lisa Thomas NP and are within treatment parameters.  Per physician team, patient is ready for treatment and there are NO modifications to the treatment plan.     

## 2022-07-27 NOTE — Progress Notes (Signed)
[facility] OFFICE PROGRESS NOTE   Diagnosis: Cholangiocarcinoma  INTERVAL HISTORY:   Jeffrey Blevins returns as scheduled.  He completed another cycle of Durvalumab 06/29/2022.  No rash or diarrhea.  Slight pain at the shoulder and hip joints.  Objective:  Vital signs in last 24 hours:  Blood pressure (!) 144/90, pulse 68, temperature 98.2 F (36.8 C), temperature source Oral, resp. rate 20, height 6\' 3"  (1.905 m), weight 196 lb 4.8 oz (89 kg), SpO2 100 %.    Resp: Lungs clear bilaterally. Cardio: Regular rate and rhythm. GI: No hepatosplenomegaly.  Mild tenderness around the right upper quadrant surgical incision. Vascular: No leg edema. Neuro: Alert and oriented. Port-A-Cath without erythema.  Lab Results:  Lab Results  Component Value Date   WBC 6.3 07/27/2022   HGB 13.1 07/27/2022   HCT 37.8 (L) 07/27/2022   MCV 80.3 07/27/2022   PLT 179 07/27/2022   NEUTROABS 3.3 07/27/2022    Imaging:  No results found.  Medications: I have reviewed the patient's current medications.  Assessment/Plan: Liver mass concerning for malignancy -CT abdomen/pelvis with contrast 08/14/1998 23-2.9 x 2.7 x 2.7 cm hypoenhancing mass in segment 5 near the liver hilum just above hepatic ductal confluence suspicious for primary bile duct malignancy or primary hepatic malignancy -MRCP 08/14/2021-irregular mass of segment 8 of the liver with perilesional enhancement concerning for cholangiocarcinoma -08/14/2021 CA 19.9 was elevated at 76 -08/16/2021 AFP 6.4 -08/16/2021 cytology from bile duct brushing showed atypical cells -08/19/2021 cytology from bile duct brushing showed cells suspicious for malignancy -08/25/2021 CT-guided liver biopsy-moderately differentiated adenocarcinoma, CK7 positive, CDX2 positive in a small population of cells, TTF-1 negative, CK20 negative-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 -CTs at [facility]  09/06/2021-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, occlusion of the anterior branch of the right portal vein, periportal enhancement and thickening on delayed imaging potentially due to tumor infiltration, no change in the 2 x 3.2 cm mass in segment 8, mild to moderate left and right intrahepatic biliary dilatation, biliary stent in place, small portacaval and periportal nodes, no evidence of metastatic disease in the chest -Cycle 1 gemcitabine/cisplatin/durvalumab 10/01/2021 -Cycle 2 gemcitabine/cisplatin/durvalumab 10/22/2021 -Cycle 3 gemcitabine/cisplatin/durvalumab 11/11/2021, D1 cisplatin held due to neuropathy, cisplatin resumed day 8 -Cycle 4 gemcitabine/cisplatin/durvalumab 12/03/2021 -MRI abdomen 12/15/2021-segment 8 cholangiocarcinoma no longer measurable, residual biliary duct dilation in the anterior right and left hepatic lobes, no evidence of metastatic disease persistent anterior right and new peripheral left portal vein occlusion -Cycle 5 gemcitabine/cisplatin/durvalumab 12/24/2021 -Cycle 6 gemcitabine/cisplatin/durvalumab 01/13/2022 -CTs at [facility] 02/14/2022-fine hypoattenuating masses in the left and right hepatic lobes, more prominent compared to the 12/15/2021 MRI, potentially representing multifocal cholangiocarcinoma, persistent occlusion of left portal and anterior branch of the right portal vein, no extrahepatic lymphadenopathy or metastatic disease -MRI liver 12/19 /2023-1.3 x 0.8 cm hypoenhancing lesion in segment 8, ill-defined area of delayed enhancement in the caudate measuring 1.7 cm, no left liver lesion, 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  Partial hepatectomy anatomy.  Marked improvement postsurgical fluid collections.  Biliary stent in place.  Mild biliary duct dilatation in the central hepatic  lobes.  No evidence of metastatic disease in the abdomen/pelvis. -07/27/2022 continue every 4-week Durvalumab 2.  Obstructive jaundice -08/16/2021 percutaneous biliary drain placement 3.  Hepatic steatosis 4.  GERD 5.  Hypertension 6.  Normocytic anemia 7.  Enlarged prostate seen on CT 8.  Tobacco use 9.  Enterococcus and Aerococcus bacteremia 08/20/2021-discharged 08/26/2021 to complete outpatient course of Augmentin 10.  Peripheral neuropathy 11.  Port-A-Cath placement 09/23/2021 12.  Biliary drain exchange 09/23/2021; drain capped 10/12/2021 13.  Rigors following biliary drain procedure 09/23/2021, biliary drain culture-Enterobacter Cloacae, Bactrim x7 days 09/27/2021 14.  Right leg edema and pain 10/18/2021-Doppler negative for DVT 15.  Admission 03/18/2022 with a right upper quadrant abscess, status post drain placement, culture positive for Klebsiella CTs 03/21/2022-no change in moderate volume ascites, interval decrease in subhepatic air-filled collection with catheter in appropriate position, resolved right pneumothorax, no change in bilateral pleural effusions-moderate on right and small on left CTs 04/25/2022 at [facility]-3.1 x 2.0 cm fluid collection in the abdomen and pelvis, decreased in volume and increased in organization; resolution of subhepatic collection; increased partially visualized large right pleural effusion with near complete collapse of the right lower lobe. Drainage catheter removed 04/25/2022 16.  Large right pleural effusion on CT 04/25/2022-thoracentesis 04/29/2022, 1.75 L of pleural fluid removed; culture negative, cytology with reactive mesothelial cells  present, acute inflammation  Disposition: Jeffrey Blevins appears stable.  He is currently receiving Durvalumab every 4 weeks.  Restaging CTs show no evidence of recurrent/metastatic disease.  Plan to continue Durvalumab on the current schedule.  CBC and chemistry panel reviewed.  Labs adequate to proceed as above.  He will return for follow-up in 4 weeks.  We are available to see him sooner if needed.  Patient seen with Dr. Truett Perna.    Lonna Cobb ANP/GNP-BC   07/27/2022  9:14 AM  This was a shared visit with Lonna Cobb.  Jeffrey Blevins is tolerating the durvalumab well.  Surveillance imaging reveals no evidence of disease progression.  The plan is to continue Durvalumab.  We will contact Dr. Modesta Messing to discuss the indication for removing the bile duct stent.  Mancel Bale, MD

## 2022-07-28 LAB — T4: T4, Total: 6.7 ug/dL (ref 4.5–12.0)

## 2022-07-29 ENCOUNTER — Other Ambulatory Visit: Payer: Self-pay

## 2022-08-10 ENCOUNTER — Other Ambulatory Visit: Payer: Self-pay

## 2022-08-15 ENCOUNTER — Other Ambulatory Visit: Payer: Self-pay

## 2022-08-20 ENCOUNTER — Other Ambulatory Visit: Payer: Self-pay | Admitting: Oncology

## 2022-08-22 ENCOUNTER — Other Ambulatory Visit: Payer: Self-pay

## 2022-08-24 ENCOUNTER — Inpatient Hospital Stay (HOSPITAL_BASED_OUTPATIENT_CLINIC_OR_DEPARTMENT_OTHER): Payer: Managed Care, Other (non HMO) | Admitting: Oncology

## 2022-08-24 ENCOUNTER — Inpatient Hospital Stay: Payer: Managed Care, Other (non HMO)

## 2022-08-24 ENCOUNTER — Inpatient Hospital Stay: Payer: Managed Care, Other (non HMO) | Attending: Oncology

## 2022-08-24 ENCOUNTER — Inpatient Hospital Stay: Payer: Managed Care, Other (non HMO) | Admitting: Oncology

## 2022-08-24 VITALS — BP 135/86 | HR 51

## 2022-08-24 VITALS — BP 138/82 | HR 66 | Temp 98.2°F | Resp 18 | Ht 75.0 in | Wt 190.6 lb

## 2022-08-24 DIAGNOSIS — Z5112 Encounter for antineoplastic immunotherapy: Secondary | ICD-10-CM | POA: Diagnosis present

## 2022-08-24 DIAGNOSIS — C221 Intrahepatic bile duct carcinoma: Secondary | ICD-10-CM | POA: Insufficient documentation

## 2022-08-24 DIAGNOSIS — Z79620 Long term (current) use of immunosuppressive biologic: Secondary | ICD-10-CM | POA: Insufficient documentation

## 2022-08-24 LAB — CMP (CANCER CENTER ONLY)
ALT: 22 U/L (ref 0–44)
AST: 23 U/L (ref 15–41)
Albumin: 4.1 g/dL (ref 3.5–5.0)
Alkaline Phosphatase: 104 U/L (ref 38–126)
Anion gap: 6 (ref 5–15)
BUN: 20 mg/dL (ref 6–20)
CO2: 26 mmol/L (ref 22–32)
Calcium: 9.3 mg/dL (ref 8.9–10.3)
Chloride: 107 mmol/L (ref 98–111)
Creatinine: 0.92 mg/dL (ref 0.61–1.24)
GFR, Estimated: >60 mL/min (ref >60)
Glucose, Bld: 88 mg/dL (ref 70–99)
Potassium: 4 mmol/L (ref 3.5–5.1)
Sodium: 139 mmol/L (ref 135–145)
Total Bilirubin: 0.5 mg/dL (ref 0.3–1.2)
Total Protein: 6.9 g/dL (ref 6.5–8.1)

## 2022-08-24 LAB — CBC WITH DIFFERENTIAL (CANCER CENTER ONLY)
Abs Immature Granulocytes: 0.01 10*3/uL (ref 0.00–0.07)
Basophils Absolute: 0 10*3/uL (ref 0.0–0.1)
Basophils Relative: 1 %
Eosinophils Absolute: 0.3 10*3/uL (ref 0.0–0.5)
Eosinophils Relative: 5 %
HCT: 39.7 % (ref 39.0–52.0)
Hemoglobin: 13.4 g/dL (ref 13.0–17.0)
Immature Granulocytes: 0 %
Lymphocytes Relative: 33 %
Lymphs Abs: 2 10*3/uL (ref 0.7–4.0)
MCH: 27.8 pg (ref 26.0–34.0)
MCHC: 33.8 g/dL (ref 30.0–36.0)
MCV: 82.4 fL (ref 80.0–100.0)
Monocytes Absolute: 0.4 10*3/uL (ref 0.1–1.0)
Monocytes Relative: 6 %
Neutro Abs: 3.4 10*3/uL (ref 1.7–7.7)
Neutrophils Relative %: 55 %
Platelet Count: 156 10*3/uL (ref 150–400)
RBC: 4.82 MIL/uL (ref 4.22–5.81)
RDW: 14.2 % (ref 11.5–15.5)
WBC Count: 6.2 10*3/uL (ref 4.0–10.5)
nRBC: 0 % (ref 0.0–0.2)

## 2022-08-24 MED ORDER — HEPARIN SOD (PORK) LOCK FLUSH 100 UNIT/ML IV SOLN
500.0000 [IU] | Freq: Once | INTRAVENOUS | Status: AC | PRN
  Administered 2022-08-24: 500 [IU]

## 2022-08-24 MED ORDER — SODIUM CHLORIDE 0.9 % IV SOLN: Freq: Once | INTRAVENOUS | Status: AC

## 2022-08-24 MED ORDER — SODIUM CHLORIDE 0.9% FLUSH
10.0000 mL | INTRAVENOUS | Status: DC | PRN
  Administered 2022-08-24: 10 mL

## 2022-08-24 MED ORDER — SODIUM CHLORIDE 0.9 % IV SOLN
1500.0000 mg | Freq: Once | INTRAVENOUS | Status: AC
  Administered 2022-08-24: 1500 mg via INTRAVENOUS
  Filled 2022-08-24: qty 30

## 2022-08-24 NOTE — Progress Notes (Signed)
[facility] OFFICE PROGRESS NOTE   Diagnosis: Cholangiocarcinoma  INTERVAL HISTORY:   [name] completed another treatment Durvalumab on 07/27/2022.  No rash or diarrhea.  He reports stiffness and discomfort in the hand joints.  The joint pain and stiffness predated Durvalumab.  The pain is relieved with ibuprofen.  He has discomfort in the right shoulder when lying on the right side.  Good appetite.  Objective:  Vital signs in last 24 hours:  Blood pressure 138/82, pulse 66, temperature 98.2 F (36.8 C), temperature source Oral, resp. rate 18, height 6\' 3"  (1.905 m), weight 190 lb 9.6 oz (86.5 kg), SpO2 100 %.    Lymphatics: No cervical, supraclavicular, or axillary nodes Resp: Clear bilaterally Cardio: Regular rate and rhythm GI: No mass, no hepatosplenomegaly, tender surrounding the right subcostal scar Vascular: No leg edema Musculoskeletal: Hand joints without erythema or swelling, no pain with motion of the right shoulder  Portacath/PICC-without erythema  Lab Results:  Lab Results  Component Value Date   WBC 6.2 08/24/2022   HGB 13.4 08/24/2022   HCT 39.7 08/24/2022   MCV 82.4 08/24/2022   PLT 156 08/24/2022   NEUTROABS 3.4 08/24/2022    CMP  Lab Results  Component Value Date   NA 139 08/24/2022   K 4.0 08/24/2022   CL 107 08/24/2022   CO2 26 08/24/2022   GLUCOSE 88 08/24/2022   BUN 20 08/24/2022   CREATININE 0.92 08/24/2022   CALCIUM 9.3 08/24/2022   PROT 6.9 08/24/2022   ALBUMIN 4.1 08/24/2022   AST 23 08/24/2022   ALT 22 08/24/2022   ALKPHOS 104 08/24/2022   BILITOT 0.5 08/24/2022   GFRNONAA >60 08/24/2022    Lab Results  Component Value Date   ZOX096 159 (H) 09/30/2021     Medications: I have reviewed the patient's current medications.   Assessment/Plan: Liver mass concerning for malignancy -CT abdomen/pelvis with contrast 08/14/1998 23-2.9 x 2.7 x 2.7 cm hypoenhancing mass in segment 5 near the liver hilum just above  hepatic ductal confluence suspicious for primary bile duct malignancy or primary hepatic malignancy -MRCP 08/14/2021-irregular mass of segment 8 of the liver with perilesional enhancement concerning for cholangiocarcinoma -08/14/2021 CA 19.9 was elevated at 76 -08/16/2021 AFP 6.4 -08/16/2021 cytology from bile duct brushing showed atypical cells -08/19/2021 cytology from bile duct brushing showed cells suspicious for malignancy -08/25/2021 CT-guided liver biopsy-moderately differentiated adenocarcinoma, CK7 positive, CDX2 positive in a small population of cells, TTF-1 negative, CK20 negative-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 -CTs at [facility] 09/06/2021-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, occlusion of the anterior branch of the right portal vein, periportal enhancement and thickening on delayed imaging potentially due to tumor infiltration, no change in the 2 x 3.2 cm mass in segment 8, mild to moderate left and right intrahepatic biliary dilatation, biliary stent in place, small portacaval and periportal nodes, no evidence of metastatic disease in the chest -Cycle 1 gemcitabine/cisplatin/durvalumab 10/01/2021 -Cycle 2 gemcitabine/cisplatin/durvalumab 10/22/2021 -Cycle 3 gemcitabine/cisplatin/durvalumab 11/11/2021, D1 cisplatin held due to neuropathy, cisplatin resumed day 8 -Cycle 4 gemcitabine/cisplatin/durvalumab 12/03/2021 -MRI abdomen 12/15/2021-segment 8 cholangiocarcinoma no longer measurable, residual biliary duct dilation in the anterior right and left hepatic lobes, no evidence of metastatic disease persistent anterior right and new peripheral left portal vein occlusion -Cycle 5 gemcitabine/cisplatin/durvalumab 12/24/2021 -Cycle 6 gemcitabine/cisplatin/durvalumab 01/13/2022 -CTs at [facility] 02/14/2022-fine hypoattenuating masses in the left and right hepatic lobes, more prominent compared to the 12/15/2021 MRI, potentially  representing multifocal cholangiocarcinoma, persistent  occlusion of left portal and anterior branch of the right portal vein, no extrahepatic lymphadenopathy or metastatic disease -MRI liver 12/19 /2023-1.3 x 0.8 cm hypoenhancing lesion in segment 8, ill-defined area of delayed enhancement in the caudate measuring 1.7 cm, no left liver lesion, 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  Partial hepatectomy anatomy.  Marked improvement postsurgical fluid collections.  Biliary stent in place.  Mild biliary duct dilatation in the central hepatic lobes.  No evidence of metastatic disease in the abdomen/pelvis. -07/27/2022 continue every 4-week Durvalumab 2.  Obstructive jaundice -08/16/2021 percutaneous biliary drain placement 3.  Hepatic steatosis 4.  GERD 5.  Hypertension 6.  Normocytic anemia 7.  Enlarged prostate seen on CT 8.  Tobacco use 9.  Enterococcus and Aerococcus bacteremia 08/20/2021-discharged 08/26/2021 to complete outpatient course of Augmentin 10.  Peripheral neuropathy 11.  Port-A-Cath placement 09/23/2021 12.  Biliary drain exchange 09/23/2021; drain capped 10/12/2021 13.  Rigors following biliary drain procedure 09/23/2021, biliary drain culture-Enterobacter Cloacae, Bactrim x7 days 09/27/2021 14.  Right leg edema and pain 10/18/2021-Doppler negative for DVT 15.  Admission 03/18/2022 with a right upper quadrant abscess, status post drain placement, culture positive for Klebsiella CTs  03/21/2022-no change in moderate volume ascites, interval decrease in subhepatic air-filled collection with catheter in appropriate position, resolved right pneumothorax, no change in bilateral pleural effusions-moderate on right and small on left CTs 04/25/2022 at [facility]-3.1 x 2.0 cm fluid collection in the abdomen and pelvis, decreased in volume and increased in organization; resolution of subhepatic collection; increased partially visualized large right pleural effusion with near complete collapse of the right lower lobe. Drainage catheter removed 04/25/2022 16.  Large right pleural effusion on CT 04/25/2022-thoracentesis 04/29/2022, 1.75 L of pleural fluid removed; culture negative, cytology with reactive mesothelial cells present, acute inflammation    Disposition: [name] appears stable.  He is tolerating the durvalumab well.  I suspect the arthralgias are unrelated to durvalumab, but this is possible.  He will continue twice daily ibuprofen as needed.  He will complete another treatment with durvalumab today.  He will return for an office visit and durvalumab in 1 month.  He will complete at least a 1 year course of durvalumab.    Thornton Papas, MD  08/24/2022  9:20 AM

## 2022-08-24 NOTE — Patient Instructions (Signed)

## 2022-08-24 NOTE — Patient Instructions (Signed)
[facility]  Discharge Instructions: Thank you for choosing [facility] to provide your oncology and hematology care.   If you have a lab appointment with the Cancer Center, please go directly to the Cancer Center and check in at the registration area.   Wear comfortable clothing and clothing appropriate for easy access to any Portacath or PICC line.   We strive to give you quality time with your provider. You may need to reschedule your appointment if you arrive late (15 or more minutes).  Arriving late affects you and other patients whose appointments are after yours.  Also, if you miss three or more appointments without notifying the office, you may be dismissed from the clinic at the provider's discretion.      For prescription refill requests, have your pharmacy contact our office and allow 72 hours for refills to be completed.    Today you received the following chemotherapy and/or immunotherapy agents Imfinzi      To help prevent nausea and vomiting after your treatment, we encourage you to take your nausea medication as directed.  BELOW ARE SYMPTOMS THAT SHOULD BE REPORTED IMMEDIATELY: *FEVER GREATER THAN 100.4 F (38 C) OR HIGHER *CHILLS OR SWEATING *NAUSEA AND VOMITING THAT IS NOT CONTROLLED WITH YOUR NAUSEA MEDICATION *UNUSUAL SHORTNESS OF BREATH *UNUSUAL BRUISING OR BLEEDING *URINARY PROBLEMS (pain or burning when urinating, or frequent urination) *BOWEL PROBLEMS (unusual diarrhea, constipation, pain near the anus) TENDERNESS IN MOUTH AND THROAT WITH OR WITHOUT PRESENCE OF ULCERS (sore throat, sores in mouth, or a toothache) UNUSUAL RASH, SWELLING OR PAIN  UNUSUAL VAGINAL DISCHARGE OR ITCHING   Items with * indicate a potential emergency and should be followed up as soon as possible or go to the Emergency Department if any problems should occur.  Please show the CHEMOTHERAPY ALERT CARD or IMMUNOTHERAPY ALERT CARD at check-in  to the Emergency Department and triage nurse.  Should you have questions after your visit or need to cancel or reschedule your appointment, please contact [facility]  Dept: [phone]  and follow the prompts.  Office hours are 8:00 a.m. to 4:30 p.m. Monday - Friday. Please note that voicemails left after 4:00 p.m. may not be returned until the following business day.  We are closed weekends and major holidays. You have access to a nurse at all times for urgent questions. Please call the main number to the clinic Dept: [phone] and follow the prompts.   For any non-urgent questions, you may also contact your provider using MyChart. We now offer e-Visits for anyone 18 and older to request care online for non-urgent symptoms. For details visit [email or web address].   Also download the MyChart app! Go to the app store, search "MyChart", open the app, select [place], and log in with your MyChart username and password.   

## 2022-08-24 NOTE — Progress Notes (Signed)
Patient seen by Dr. Sherrill today ? ?Vitals are within treatment parameters. ? ?Labs reviewed by Dr. Sherrill and are within treatment parameters. ? ?Per physician team, patient is ready for treatment and there are NO modifications to the treatment plan.  ?

## 2022-09-06 ENCOUNTER — Other Ambulatory Visit: Payer: Self-pay

## 2022-09-08 ENCOUNTER — Other Ambulatory Visit: Payer: Self-pay

## 2022-09-08 ENCOUNTER — Telehealth: Payer: Self-pay | Admitting: Oncology

## 2022-09-08 NOTE — Telephone Encounter (Signed)
Scheduled 06/26 los and wq, patient has been called and notified.

## 2022-09-21 ENCOUNTER — Inpatient Hospital Stay: Payer: Managed Care, Other (non HMO)

## 2022-09-21 ENCOUNTER — Other Ambulatory Visit: Payer: Self-pay | Admitting: *Deleted

## 2022-09-21 ENCOUNTER — Inpatient Hospital Stay
Admission: RE | Admit: 2022-09-21 | Discharge: 2022-09-21 | Disposition: A | Discharge status: Home / Self Care | Payer: Self-pay | Source: Ambulatory Visit | Attending: Oncology | Admitting: Oncology

## 2022-09-21 ENCOUNTER — Inpatient Hospital Stay (HOSPITAL_BASED_OUTPATIENT_CLINIC_OR_DEPARTMENT_OTHER): Payer: Managed Care, Other (non HMO) | Admitting: Oncology

## 2022-09-21 ENCOUNTER — Inpatient Hospital Stay: Payer: Managed Care, Other (non HMO) | Attending: Oncology

## 2022-09-21 VITALS — BP 140/89 | HR 100 | Temp 98.1°F | Resp 18 | Ht 75.0 in | Wt 200.8 lb

## 2022-09-21 VITALS — BP 140/92 | HR 52

## 2022-09-21 DIAGNOSIS — C221 Intrahepatic bile duct carcinoma: Secondary | ICD-10-CM

## 2022-09-21 DIAGNOSIS — Z5112 Encounter for antineoplastic immunotherapy: Secondary | ICD-10-CM | POA: Insufficient documentation

## 2022-09-21 LAB — CMP (CANCER CENTER ONLY)
ALT: 21 U/L (ref 0–44)
AST: 24 U/L (ref 15–41)
Albumin: 4.4 g/dL (ref 3.5–5.0)
Alkaline Phosphatase: 89 U/L (ref 38–126)
Anion gap: 7 (ref 5–15)
BUN: 18 mg/dL (ref 6–20)
CO2: 27 mmol/L (ref 22–32)
Calcium: 9.7 mg/dL (ref 8.9–10.3)
Chloride: 105 mmol/L (ref 98–111)
Creatinine: 0.95 mg/dL (ref 0.61–1.24)
GFR, Estimated: >60 mL/min (ref >60)
Glucose, Bld: 103 mg/dL — ABNORMAL HIGH (ref 70–99)
Potassium: 4 mmol/L (ref 3.5–5.1)
Sodium: 139 mmol/L (ref 135–145)
Total Bilirubin: 0.7 mg/dL (ref 0.3–1.2)
Total Protein: 7.2 g/dL (ref 6.5–8.1)

## 2022-09-21 LAB — CBC WITH DIFFERENTIAL (CANCER CENTER ONLY)
Abs Immature Granulocytes: 0.02 10*3/uL (ref 0.00–0.07)
Basophils Absolute: 0 10*3/uL (ref 0.0–0.1)
Basophils Relative: 1 %
Eosinophils Absolute: 0.3 10*3/uL (ref 0.0–0.5)
Eosinophils Relative: 6 %
HCT: 40.4 % (ref 39.0–52.0)
Hemoglobin: 13.7 g/dL (ref 13.0–17.0)
Immature Granulocytes: 0 %
Lymphocytes Relative: 33 %
Lymphs Abs: 2 10*3/uL (ref 0.7–4.0)
MCH: 28.5 pg (ref 26.0–34.0)
MCHC: 33.9 g/dL (ref 30.0–36.0)
MCV: 84.2 fL (ref 80.0–100.0)
Monocytes Absolute: 0.5 10*3/uL (ref 0.1–1.0)
Monocytes Relative: 8 %
Neutro Abs: 3.2 10*3/uL (ref 1.7–7.7)
Neutrophils Relative %: 52 %
Platelet Count: 162 10*3/uL (ref 150–400)
RBC: 4.8 MIL/uL (ref 4.22–5.81)
RDW: 13.5 % (ref 11.5–15.5)
WBC Count: 6.1 10*3/uL (ref 4.0–10.5)
nRBC: 0 % (ref 0.0–0.2)

## 2022-09-21 MED ORDER — SODIUM CHLORIDE 0.9% FLUSH
10.0000 mL | INTRAVENOUS | Status: DC | PRN
  Administered 2022-09-21: 10 mL

## 2022-09-21 MED ORDER — SODIUM CHLORIDE 0.9 % IV SOLN
1500.0000 mg | Freq: Once | INTRAVENOUS | Status: AC
  Administered 2022-09-21: 1500 mg via INTRAVENOUS
  Filled 2022-09-21: qty 30

## 2022-09-21 MED ORDER — HEPARIN SOD (PORK) LOCK FLUSH 100 UNIT/ML IV SOLN
500.0000 [IU] | Freq: Once | INTRAVENOUS | Status: AC | PRN
  Administered 2022-09-21: 500 [IU]

## 2022-09-21 MED ORDER — SODIUM CHLORIDE 0.9 % IV SOLN: Freq: Once | INTRAVENOUS | Status: AC

## 2022-09-21 NOTE — Patient Instructions (Signed)

## 2022-09-21 NOTE — Progress Notes (Signed)
Patient seen by Dr. Sherrill today ? ?Vitals are within treatment parameters. ? ?Labs reviewed by Dr. Sherrill and are within treatment parameters. ? ?Per physician team, patient is ready for treatment and there are NO modifications to the treatment plan.  ?

## 2022-09-21 NOTE — Progress Notes (Signed)
Placed referral order to [facility] per Dr. Truett Perna request for right preauricular lesion. Also notified radiology to please import [facility] CT 04/25/22 for next CT to be compared with this.

## 2022-09-21 NOTE — Progress Notes (Signed)
[facility] OFFICE PROGRESS NOTE   Diagnosis: Cholangiocarcinoma  INTERVAL HISTORY:   [name] returns as scheduled.  He was last treated with durvalumab on 08/24/2022.  He feels well.  No rash or diarrhea.  He has mild arthralgias relieved with ibuprofen.  He has noted a lesion at the right preauricular area for the past several months.  The lesion has scabbed but persists.  No other complaint.  Objective:  Vital signs in last 24 hours:  Blood pressure (!) 140/89, pulse 100, temperature 98.1 F (36.7 C), temperature source Oral, resp. rate 18, height 6\' 3"  (1.905 m), weight 200 lb 12.8 oz (91.1 kg), SpO2 96%.    Resp: Lungs clear bilaterally Cardio: Regular rate and rhythm GI: No hepatosplenomegaly no mild tenderness at the lateral aspect of the right upper abdomen incision, no mass Vascular: No leg edema  Skin: Less than 1 cm raised scabbed lesion at the right preauricular area, there appears to be an underlying tan slightly raised lesion  Portacath/PICC-without erythema  Lab Results:  Lab Results  Component Value Date   WBC 6.1 09/21/2022   HGB 13.7 09/21/2022   HCT 40.4 09/21/2022   MCV 84.2 09/21/2022   PLT 162 09/21/2022   NEUTROABS 3.2 09/21/2022    CMP  Lab Results  Component Value Date   NA 139 08/24/2022   K 4.0 08/24/2022   CL 107 08/24/2022   CO2 26 08/24/2022   GLUCOSE 88 08/24/2022   BUN 20 08/24/2022   CREATININE 0.92 08/24/2022   CALCIUM 9.3 08/24/2022   PROT 6.9 08/24/2022   ALBUMIN 4.1 08/24/2022   AST 23 08/24/2022   ALT 22 08/24/2022   ALKPHOS 104 08/24/2022   BILITOT 0.5 08/24/2022   GFRNONAA >60 08/24/2022    Lab Results  Component Value Date   [id number] 159 (H) 09/30/2021     Medications: I have reviewed the patient's current medications.   Assessment/Plan: Liver mass concerning for malignancy -CT abdomen/pelvis with contrast 08/14/1998 23-2.9 x 2.7 x 2.7 cm hypoenhancing mass in segment 5 near the liver hilum just  above hepatic ductal confluence suspicious for primary bile duct malignancy or primary hepatic malignancy -MRCP 08/14/2021-irregular mass of segment 8 of the liver with perilesional enhancement concerning for cholangiocarcinoma -08/14/2021 CA 19.9 was elevated at 75 -08/16/2021 AFP 6.4 -08/16/2021 cytology from bile duct brushing showed atypical cells -08/19/2021 cytology from bile duct brushing showed cells suspicious for malignancy -08/25/2021 CT-guided liver biopsy-moderately differentiated adenocarcinoma, CK7 positive, CDX2 positive in a small population of cells, TTF-1 negative, CK20 negative-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 -CTs at [facility] 09/06/2021-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, occlusion of the anterior branch of the right portal vein, periportal enhancement and thickening on delayed imaging potentially due to tumor infiltration, no change in the 2 x 3.2 cm mass in segment 8, mild to moderate left and right intrahepatic biliary dilatation, biliary stent in place, small portacaval and periportal nodes, no evidence of metastatic disease in the chest -Cycle 1 gemcitabine/cisplatin/durvalumab 10/01/2021 -Cycle 2 gemcitabine/cisplatin/durvalumab 10/22/2021 -Cycle 3 gemcitabine/cisplatin/durvalumab 11/11/2021, D1 cisplatin held due to neuropathy, cisplatin resumed day 8 -Cycle 4 gemcitabine/cisplatin/durvalumab 12/03/2021 -MRI abdomen 12/15/2021-segment 8 cholangiocarcinoma no longer measurable, residual biliary duct dilation in the anterior right and left hepatic lobes, no evidence of metastatic disease persistent anterior right and new peripheral left portal vein occlusion -Cycle 5 gemcitabine/cisplatin/durvalumab 12/24/2021 -Cycle 6 gemcitabine/cisplatin/durvalumab 01/13/2022 -CTs at [facility] 02/14/2022-fine hypoattenuating masses in the left and right hepatic lobes,  more prominent compared to the 12/15/2021 MRI, potentially  representing multifocal cholangiocarcinoma, persistent occlusion of left portal and anterior branch of the right portal vein, no extrahepatic lymphadenopathy or metastatic disease -MRI liver 12/19 /2023-1.3 x 0.8 cm hypoenhancing lesion in segment 8, ill-defined area of delayed enhancement in the caudate measuring 1.7 cm, no left liver lesion, 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  Partial hepatectomy anatomy.  Marked improvement postsurgical fluid collections.  Biliary stent in place.  Mild biliary duct dilatation in the central hepatic lobes.  No evidence of metastatic disease in the abdomen/pelvis. -07/27/2022 continue every 4-week Durvalumab 2.  Obstructive jaundice -08/16/2021 percutaneous biliary drain placement 3.  Hepatic steatosis 4.  GERD 5.  Hypertension 6.  Normocytic anemia 7.  Enlarged prostate seen on CT 8.  Tobacco use 9.  Enterococcus and Aerococcus bacteremia 08/20/2021-discharged 08/26/2021 to complete outpatient course of Augmentin 10.  Peripheral neuropathy 11.  Port-A-Cath placement 09/23/2021 12.  Biliary drain exchange 09/23/2021; drain capped 10/12/2021 13.  Rigors following biliary drain procedure 09/23/2021, biliary drain culture-Enterobacter Cloacae, Bactrim x7 days 09/27/2021 14.  Right leg edema and pain 10/18/2021-Doppler negative for DVT 15.  Admission 03/18/2022 with a right upper quadrant abscess, status post drain placement, culture positive for Klebsiella CTs  03/21/2022-no change in moderate volume ascites, interval decrease in subhepatic air-filled collection with catheter in appropriate position, resolved right pneumothorax, no change in bilateral pleural effusions-moderate on right and small on left CTs 04/25/2022 at [facility]-3.1 x 2.0 cm fluid collection in the abdomen and pelvis, decreased in volume and increased in organization; resolution of subhepatic collection; increased partially visualized large right pleural effusion with near complete collapse of the right lower lobe. Drainage catheter removed 04/25/2022 16.  Large right pleural effusion on CT 04/25/2022-thoracentesis 04/29/2022, 1.75 L of pleural fluid removed; culture negative, cytology with reactive mesothelial cells present, acute inflammation      Disposition: Mr. Lanese appears stable.  He is tolerating the durvalumab well.  He will undergo his surveillance CTs prior to an office visit in 1 month. Will complete another treatment with durvalumab today. Thornton Papas, MD  09/21/2022  8:53 AM

## 2022-09-21 NOTE — Patient Instructions (Signed)
[facility]  Discharge Instructions: Thank you for choosing [facility] to provide your oncology and hematology care.   If you have a lab appointment with the Cancer Center, please go directly to the Cancer Center and check in at the registration area.   Wear comfortable clothing and clothing appropriate for easy access to any Portacath or PICC line.   We strive to give you quality time with your provider. You may need to reschedule your appointment if you arrive late (15 or more minutes).  Arriving late affects you and other patients whose appointments are after yours.  Also, if you miss three or more appointments without notifying the office, you may be dismissed from the clinic at the provider's discretion.      For prescription refill requests, have your pharmacy contact our office and allow 72 hours for refills to be completed.    Today you received the following chemotherapy and/or immunotherapy agents Imfinzi      To help prevent nausea and vomiting after your treatment, we encourage you to take your nausea medication as directed.  BELOW ARE SYMPTOMS THAT SHOULD BE REPORTED IMMEDIATELY: *FEVER GREATER THAN 100.4 F (38 C) OR HIGHER *CHILLS OR SWEATING *NAUSEA AND VOMITING THAT IS NOT CONTROLLED WITH YOUR NAUSEA MEDICATION *UNUSUAL SHORTNESS OF BREATH *UNUSUAL BRUISING OR BLEEDING *URINARY PROBLEMS (pain or burning when urinating, or frequent urination) *BOWEL PROBLEMS (unusual diarrhea, constipation, pain near the anus) TENDERNESS IN MOUTH AND THROAT WITH OR WITHOUT PRESENCE OF ULCERS (sore throat, sores in mouth, or a toothache) UNUSUAL RASH, SWELLING OR PAIN  UNUSUAL VAGINAL DISCHARGE OR ITCHING   Items with * indicate a potential emergency and should be followed up as soon as possible or go to the Emergency Department if any problems should occur.  Please show the CHEMOTHERAPY ALERT CARD or IMMUNOTHERAPY ALERT CARD at check-in  to the Emergency Department and triage nurse.  Should you have questions after your visit or need to cancel or reschedule your appointment, please contact [facility]  Dept: [phone]  and follow the prompts.  Office hours are 8:00 a.m. to 4:30 p.m. Monday - Friday. Please note that voicemails left after 4:00 p.m. may not be returned until the following business day.  We are closed weekends and major holidays. You have access to a nurse at all times for urgent questions. Please call the main number to the clinic Dept: [phone] and follow the prompts.   For any non-urgent questions, you may also contact your provider using MyChart. We now offer e-Visits for anyone 18 and older to request care online for non-urgent symptoms. For details visit [email or web address].   Also download the MyChart app! Go to the app store, search "MyChart", open the app, select [place], and log in with your MyChart username and password.   

## 2022-09-22 ENCOUNTER — Other Ambulatory Visit: Payer: Self-pay

## 2022-09-25 ENCOUNTER — Other Ambulatory Visit: Payer: Self-pay

## 2022-09-29 ENCOUNTER — Encounter: Payer: Self-pay | Admitting: *Deleted

## 2022-09-30 ENCOUNTER — Encounter: Payer: Self-pay | Admitting: *Deleted

## 2022-10-05 ENCOUNTER — Encounter: Payer: Self-pay | Admitting: *Deleted

## 2022-10-05 NOTE — Progress Notes (Signed)
Physician statement and office notes from 02/2022 faxed to [facility].  Fax confirmation received.

## 2022-10-14 ENCOUNTER — Encounter: Payer: Self-pay | Admitting: Oncology

## 2022-10-15 ENCOUNTER — Other Ambulatory Visit: Payer: Self-pay | Admitting: Oncology

## 2022-10-15 DIAGNOSIS — C221 Intrahepatic bile duct carcinoma: Secondary | ICD-10-CM

## 2022-10-18 ENCOUNTER — Ambulatory Visit (HOSPITAL_BASED_OUTPATIENT_CLINIC_OR_DEPARTMENT_OTHER)
Admission: RE | Admit: 2022-10-18 | Discharge: 2022-10-18 | Disposition: A | Discharge status: Home / Self Care | Payer: Managed Care, Other (non HMO) | Source: Ambulatory Visit | Attending: Oncology | Admitting: Oncology

## 2022-10-18 ENCOUNTER — Encounter: Payer: Self-pay | Admitting: Oncology

## 2022-10-18 DIAGNOSIS — C221 Intrahepatic bile duct carcinoma: Secondary | ICD-10-CM | POA: Insufficient documentation

## 2022-10-18 MED ORDER — HEPARIN SOD (PORK) LOCK FLUSH 100 UNIT/ML IV SOLN
500.0000 [IU] | Freq: Once | INTRAVENOUS | Status: AC
  Administered 2022-10-18: 500 [IU] via INTRAVENOUS

## 2022-10-18 MED ORDER — IOHEXOL 300 MG/ML  SOLN
100.0000 mL | Freq: Once | INTRAMUSCULAR | Status: AC | PRN
  Administered 2022-10-18: 85 mL via INTRAVENOUS

## 2022-10-19 ENCOUNTER — Inpatient Hospital Stay: Payer: Managed Care, Other (non HMO)

## 2022-10-19 ENCOUNTER — Other Ambulatory Visit: Payer: Self-pay | Admitting: *Deleted

## 2022-10-19 ENCOUNTER — Encounter: Payer: Self-pay | Admitting: *Deleted

## 2022-10-19 ENCOUNTER — Inpatient Hospital Stay (HOSPITAL_BASED_OUTPATIENT_CLINIC_OR_DEPARTMENT_OTHER): Payer: Managed Care, Other (non HMO) | Admitting: Oncology

## 2022-10-19 ENCOUNTER — Inpatient Hospital Stay: Payer: Managed Care, Other (non HMO) | Attending: Oncology

## 2022-10-19 VITALS — BP 135/89 | HR 58 | Temp 98.1°F | Resp 18 | Ht 75.0 in | Wt 207.1 lb

## 2022-10-19 VITALS — BP 160/86 | HR 54 | Temp 98.2°F | Resp 18

## 2022-10-19 DIAGNOSIS — C221 Intrahepatic bile duct carcinoma: Secondary | ICD-10-CM | POA: Diagnosis present

## 2022-10-19 DIAGNOSIS — Z5112 Encounter for antineoplastic immunotherapy: Secondary | ICD-10-CM | POA: Diagnosis present

## 2022-10-19 DIAGNOSIS — Z79620 Long term (current) use of immunosuppressive biologic: Secondary | ICD-10-CM | POA: Insufficient documentation

## 2022-10-19 LAB — CBC WITH DIFFERENTIAL (CANCER CENTER ONLY)
Abs Immature Granulocytes: 0.01 10*3/uL (ref 0.00–0.07)
Basophils Absolute: 0 10*3/uL (ref 0.0–0.1)
Basophils Relative: 1 %
Eosinophils Absolute: 0.3 10*3/uL (ref 0.0–0.5)
Eosinophils Relative: 7 %
HCT: 38.8 % — ABNORMAL LOW (ref 39.0–52.0)
Hemoglobin: 13.3 g/dL (ref 13.0–17.0)
Immature Granulocytes: 0 %
Lymphocytes Relative: 39 %
Lymphs Abs: 1.8 10*3/uL (ref 0.7–4.0)
MCH: 29 pg (ref 26.0–34.0)
MCHC: 34.3 g/dL (ref 30.0–36.0)
MCV: 84.5 fL (ref 80.0–100.0)
Monocytes Absolute: 0.3 10*3/uL (ref 0.1–1.0)
Monocytes Relative: 7 %
Neutro Abs: 2.1 10*3/uL (ref 1.7–7.7)
Neutrophils Relative %: 46 %
Platelet Count: 156 10*3/uL (ref 150–400)
RBC: 4.59 MIL/uL (ref 4.22–5.81)
RDW: 13.3 % (ref 11.5–15.5)
WBC Count: 4.5 10*3/uL (ref 4.0–10.5)
nRBC: 0 % (ref 0.0–0.2)

## 2022-10-19 LAB — CMP (CANCER CENTER ONLY)
ALT: 18 U/L (ref 0–44)
AST: 22 U/L (ref 15–41)
Albumin: 4.1 g/dL (ref 3.5–5.0)
Alkaline Phosphatase: 83 U/L (ref 38–126)
Anion gap: 8 (ref 5–15)
BUN: 17 mg/dL (ref 6–20)
CO2: 25 mmol/L (ref 22–32)
Calcium: 8.8 mg/dL — ABNORMAL LOW (ref 8.9–10.3)
Chloride: 107 mmol/L (ref 98–111)
Creatinine: 0.98 mg/dL (ref 0.61–1.24)
GFR, Estimated: >60 mL/min (ref >60)
Glucose, Bld: 104 mg/dL — ABNORMAL HIGH (ref 70–99)
Potassium: 4.1 mmol/L (ref 3.5–5.1)
Sodium: 140 mmol/L (ref 135–145)
Total Bilirubin: 0.6 mg/dL (ref 0.3–1.2)
Total Protein: 6.7 g/dL (ref 6.5–8.1)

## 2022-10-19 LAB — TSH: TSH: 8.927 u[IU]/mL — ABNORMAL HIGH (ref 0.350–4.500)

## 2022-10-19 MED ORDER — SODIUM CHLORIDE 0.9% FLUSH
10.0000 mL | INTRAVENOUS | Status: DC | PRN
  Administered 2022-10-19: 10 mL

## 2022-10-19 MED ORDER — SODIUM CHLORIDE 0.9 % IV SOLN: Freq: Once | INTRAVENOUS | Status: AC

## 2022-10-19 MED ORDER — SODIUM CHLORIDE 0.9 % IV SOLN
1500.0000 mg | Freq: Once | INTRAVENOUS | Status: AC
  Administered 2022-10-19: 1500 mg via INTRAVENOUS
  Filled 2022-10-19: qty 30

## 2022-10-19 MED ORDER — HEPARIN SOD (PORK) LOCK FLUSH 100 UNIT/ML IV SOLN
500.0000 [IU] | Freq: Once | INTRAVENOUS | Status: AC | PRN
  Administered 2022-10-19: 500 [IU]

## 2022-10-19 NOTE — Progress Notes (Signed)
Patient seen by Dr. Truett Perna today  Vitals are within treatment parameters.Pulse 58 and BP 135/84--OK to proceed  Labs reviewed by Dr. Truett Perna and are within treatment parameters.  Per physician team, patient is ready for treatment and there are NO modifications to the treatment plan.

## 2022-10-19 NOTE — Progress Notes (Signed)
[facility] OFFICE PROGRESS NOTE   Diagnosis: Cholangiocarcinoma  INTERVAL HISTORY:   Jeffrey Blevins completed another cycle of durvalumab on 09/21/2022.  No rash or diarrhea.  He continues to have neuropathy symptoms.  He has arthralgias in the hands and shoulders.  No joint swelling or erythema.  No other complaint  Objective:  Vital signs in last 24 hours:  Blood pressure 135/89, pulse (!) 53, temperature 98.1 F (36.7 C), temperature source Oral, resp. rate 18, height 6\' 3"  (1.905 m), weight 207 lb 1.6 oz (93.9 kg), SpO2 100%. Lymphatics: No cervical, supraclavicular, axillary, or inguinal nodes Resp: Lungs clear bilaterally Cardio: Regular rate and rhythm GI: No hepatosplenomegaly, firm soft tissue surrounding the right upper abdomen scar, no mass Vascular: No leg edema Musculoskeletal: Joints without erythema or swelling Skin: No rash, skin lesion posterior to the right ear with a superficial clot  Portacath/PICC-without erythema  Lab Results:  Lab Results  Component Value Date   WBC 4.5 10/19/2022   HGB 13.3 10/19/2022   HCT 38.8 (L) 10/19/2022   MCV 84.5 10/19/2022   PLT 156 10/19/2022   NEUTROABS 2.1 10/19/2022    CMP  Lab Results  Component Value Date   NA 139 09/21/2022   K 4.0 09/21/2022   CL 105 09/21/2022   CO2 27 09/21/2022   GLUCOSE 103 (H) 09/21/2022   BUN 18 09/21/2022   CREATININE 0.95 09/21/2022   CALCIUM 9.7 09/21/2022   PROT 7.2 09/21/2022   ALBUMIN 4.4 09/21/2022   AST 24 09/21/2022   ALT 21 09/21/2022   ALKPHOS 89 09/21/2022   BILITOT 0.7 09/21/2022   GFRNONAA >60 09/21/2022    Lab Results  Component Value Date   CAN199 159 (H) 09/30/2021    Lab Results  Component Value Date   INR 1.1 08/25/2021   LABPROT 13.9 08/25/2021    Imaging:  CT CHEST ABDOMEN PELVIS W CONTRAST  Result Date: 10/18/2022 CLINICAL DATA:  Cholangiocarcinoma surveillance, history of chemotherapy and cholecystectomy * Tracking Code: BO * EXAM:  CT CHEST, ABDOMEN, AND PELVIS WITH CONTRAST TECHNIQUE: Multidetector CT imaging of the chest, abdomen and pelvis was performed following the standard protocol during bolus administration of intravenous contrast. RADIATION DOSE REDUCTION: This exam was performed according to the departmental dose-optimization program which includes automated exposure control, adjustment of the mA and/or kV according to patient size and/or use of iterative reconstruction technique. CONTRAST:  85mL OMNIPAQUE IOHEXOL 300 MG/ML  SOLN COMPARISON:  CT abdomen pelvis, 07/22/2022 FINDINGS: CT CHEST FINDINGS Cardiovascular: Right chest port catheter. Normal heart size. Left and right coronary artery calcifications no pericardial effusion. Mediastinum/Nodes: No enlarged mediastinal, hilar, or axillary lymph nodes. Thymic or rebound mediastinum. Thyroid gland, trachea, and esophagus demonstrate no significant findings. Lungs/Pleura: Diffuse bilateral bronchial wall thickening. Unchanged subpleural nodule lateral segment right middle lobe measuring 0.5 cm (series 4, image 125). No pleural effusion or pneumothorax. Musculoskeletal: No chest wall abnormality. No acute osseous findings. CT ABDOMEN PELVIS FINDINGS Hepatobiliary: Partial left hepatectomy and/or severe post treatment atrophy of the left lobe of the liver and anterior right lobe of the liver, this appearance unchanged compared to prior examination (series 2, image 56). Status post cholecystectomy. Common hepatic and common bile duct stent, unchanged. Unchanged, segmental intrahepatic biliary ductal dilatation within the remnant left lobe and anterior right lobe of the liver. No extrahepatic bile duct dilatation. Pancreas: Unremarkable. No pancreatic ductal dilatation or surrounding inflammatory changes. Spleen: Normal in size without significant abnormality. Adrenals/Urinary Tract: Adrenal glands are unremarkable.  Horseshoe kidney kidneys are otherwise normal, without renal calculi,  solid lesion, or hydronephrosis. Bladder is unremarkable. Stomach/Bowel: Stomach is within normal limits. Appendix appears normal. No evidence of bowel wall thickening, distention, or inflammatory changes. Vascular/Lymphatic: Aortic atherosclerosis. No enlarged abdominal or pelvic lymph nodes. Reproductive: Prostatomegaly. Other: No abdominal wall hernia or abnormality. No ascites. Musculoskeletal: No acute osseous findings. IMPRESSION: 1. Partial left hepatectomy and/or severe post treatment atrophy of the left lobe of the liver and anterior right lobe of the liver, appearance unchanged compared to prior examination. 2. Common hepatic and common bile duct stent, unchanged. Unchanged, segmental intrahepatic biliary ductal dilatation within the remnant left lobe and anterior right lobe of the liver. No extrahepatic bile duct dilatation. 3. No evidence of lymphadenopathy or metastatic disease in the chest, abdomen, or pelvis. 4. Unchanged subpleural nodule lateral segment right middle lobe measuring 0.5 cm, likely benign and incidental. Attention on follow-up. 5. Coronary artery disease. 6. Horseshoe kidney. 7. Prostatomegaly. Aortic Atherosclerosis (ICD10-I70.0). Electronically Signed   By: Jearld Lesch M.D.   On: 10/18/2022 17:54    Medications: I have reviewed the patient's current medications.   Assessment/Plan: Liver mass concerning for malignancy -CT abdomen/pelvis with contrast 08/14/1998 23-2.9 x 2.7 x 2.7 cm hypoenhancing mass in segment 5 near the liver hilum just above hepatic ductal confluence suspicious for primary bile duct malignancy or primary hepatic malignancy -MRCP 08/14/2021-irregular mass of segment 8 of the liver with perilesional enhancement concerning for cholangiocarcinoma -08/14/2021 CA 19.9 was elevated at 76 -08/16/2021 AFP 6.4 -08/16/2021 cytology from bile duct brushing showed atypical cells -08/19/2021 cytology from bile duct brushing showed cells suspicious for  malignancy -08/25/2021 CT-guided liver biopsy-moderately differentiated adenocarcinoma, CK7 positive, CDX2 positive in a small population of cells, TTF-1 negative, CK20 negative-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 -CTs at [facility] 09/06/2021-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, occlusion of the anterior branch of the right portal vein, periportal enhancement and thickening on delayed imaging potentially due to tumor infiltration, no change in the 2 x 3.2 cm mass in segment 8, mild to moderate left and right intrahepatic biliary dilatation, biliary stent in place, small portacaval and periportal nodes, no evidence of metastatic disease in the chest -Cycle 1 gemcitabine/cisplatin/durvalumab 10/01/2021 -Cycle 2 gemcitabine/cisplatin/durvalumab 10/22/2021 -Cycle 3 gemcitabine/cisplatin/durvalumab 11/11/2021, D1 cisplatin held due to neuropathy, cisplatin resumed day 8 -Cycle 4 gemcitabine/cisplatin/durvalumab 12/03/2021 -MRI abdomen 12/15/2021-segment 8 cholangiocarcinoma no longer measurable, residual biliary duct dilation in the anterior right and left hepatic lobes, no evidence of metastatic disease persistent anterior right and new peripheral left portal vein occlusion -Cycle 5 gemcitabine/cisplatin/durvalumab 12/24/2021 -Cycle 6 gemcitabine/cisplatin/durvalumab 01/13/2022 -CTs at [facility] 02/14/2022-fine hypoattenuating masses in the left and right hepatic lobes, more prominent compared to the 12/15/2021 MRI, potentially representing multifocal cholangiocarcinoma, persistent occlusion of left portal and anterior branch of the right portal vein, no extrahepatic lymphadenopathy or metastatic disease -MRI liver 12/19 /2023-1.3 x 0.8 cm hypoenhancing lesion in segment 8, ill-defined area of delayed enhancement in the caudate measuring 1.7 cm, no left liver lesion, 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  Partial hepatectomy anatomy.  Marked improvement postsurgical  fluid collections.  Biliary stent in place.  Mild biliary duct dilatation in the central hepatic lobes.  No evidence of metastatic disease in the abdomen/pelvis. -07/27/2022 continue every 4-week Durvalumab -CTs 10/18/2022-partial left hepatectomy, common hepatic and bile duct stent unchanged with unchanged segmental intrahepatic biliary duct dilation in the remnant left lobe and anterior right lobe.  No evidence of metastatic disease in the chest, abdomen, or pelvis.  Unchanged 0.5 cm subpleural nodule in the right middle lobe 2.  Obstructive jaundice -08/16/2021 percutaneous biliary drain placement 3.  Hepatic steatosis 4.  GERD 5.  Hypertension 6.  Normocytic anemia 7.  Enlarged prostate seen on CT 8.  Tobacco use 9.  Enterococcus and Aerococcus bacteremia 08/20/2021-discharged 08/26/2021 to complete outpatient course of Augmentin 10.  Peripheral neuropathy 11.  Port-A-Cath placement 09/23/2021 12.  Biliary drain exchange 09/23/2021; drain capped 10/12/2021 13.  Rigors following biliary drain procedure 09/23/2021, biliary drain culture-Enterobacter Cloacae, Bactrim x7 days 09/27/2021 14.  Right leg edema and pain 10/18/2021-Doppler negative for DVT 15.  Admission 03/18/2022 with a right upper quadrant abscess, status post drain placement, culture positive for Klebsiella CTs 03/21/2022-no change in moderate volume ascites, interval decrease in subhepatic air-filled collection with  catheter in appropriate position, resolved right pneumothorax, no change in bilateral pleural effusions-moderate on right and small on left CTs 04/25/2022 at [facility]-3.1 x 2.0 cm fluid collection in the abdomen and pelvis, decreased in volume and increased in organization; resolution of subhepatic collection; increased partially visualized large right pleural effusion with near complete collapse of the right lower lobe. Drainage catheter removed 04/25/2022 16.  Large right pleural effusion on CT 04/25/2022-thoracentesis 04/29/2022, 1.75 L of pleural fluid removed; culture negative, cytology with reactive mesothelial cells present, acute inflammation      Disposition: Jeffrey Blevins appears stable.  He is in clinical remission from cholangiocarcinoma.  The plan is to continue durvalumab maintenance.  He has arthralgias, likely related to a benign musculoskeletal condition.  I have a low clinical suspicion for rheumatoid arthritis related to durvalumab.  We will check a rheumatoid factor today.  We will refer him again to dermatology to evaluate the skin lesion.  He will return for an office visit in Durvalumab in 4 weeks.  Thornton Papas, MD  10/19/2022  10:09 AM

## 2022-10-19 NOTE — Patient Instructions (Signed)
Durvalumab Injection What is this medication? DURVALUMAB (dur VAL ue mab) treats some types of cancer. It works by helping your immune system slow or stop the spread of cancer cells. It is a monoclonal antibody. This medicine may be used for other purposes; ask your health care provider or pharmacist if you have questions. COMMON BRAND NAME(S): IMFINZI What should I tell my care team before I take this medication? They need to know if you have any of these conditions: Allogeneic stem cell transplant (uses someone else's stem cells) Autoimmune diseases, such as Crohn disease, ulcerative colitis, lupus History of chest radiation Nervous system problems, such as Guillain-Barre syndrome, myasthenia gravis Organ transplant An unusual or allergic reaction to durvalumab, other medications, foods, dyes, or preservatives Pregnant or trying to get pregnant Breast-feeding How should I use this medication? This medication is infused into a vein. It is given by your care team in a hospital or clinic setting. A special MedGuide will be given to you before each treatment. Be sure to read this information carefully each time. Talk to your care team about the use of this medication in children. Special care may be needed. Overdosage: If you think you have taken too much of this medicine contact a poison control center or emergency room at once. NOTE: This medicine is only for you. Do not share this medicine with others. What if I miss a dose? Keep appointments for follow-up doses. It is important not to miss your dose. Call your care team if you are unable to keep an appointment. What may interact with this medication? Interactions have not been studied. This list may not describe all possible interactions. Give your health care provider a list of all the medicines, herbs, non-prescription drugs, or dietary supplements you use. Also tell them if you smoke, drink alcohol, or use illegal drugs. Some items may  interact with your medicine. What should I watch for while using this medication? Your condition will be monitored carefully while you are receiving this medication. You may need blood work while taking this medication. This medication may cause serious skin reactions. They can happen weeks to months after starting the medication. Contact your care team right away if you notice fevers or flu-like symptoms with a rash. The rash may be red or purple and then turn into blisters or peeling of the skin. You may also notice a red rash with swelling of the face, lips, or lymph nodes in your neck or under your arms. Tell your care team right away if you have any change in your eyesight. Talk to your care team if you may be pregnant. Serious birth defects can occur if you take this medication during pregnancy and for 3 months after the last dose. You will need a negative pregnancy test before starting this medication. Contraception is recommended while taking this medication and for 3 months after the last dose. Your care team can help you find the option that works for you. Do not breastfeed while taking this medication and for 3 months after the last dose. What side effects may I notice from receiving this medication? Side effects that you should report to your care team as soon as possible: Allergic reactions--skin rash, itching, hives, swelling of the face, lips, tongue, or throat Dry cough, shortness of breath or trouble breathing Eye pain, redness, irritation, or discharge with blurry or decreased vision Heart muscle inflammation--unusual weakness or fatigue, shortness of breath, chest pain, fast or irregular heartbeat, dizziness, swelling of the   ankles, feet, or hands Hormone gland problems--headache, sensitivity to light, unusual weakness or fatigue, dizziness, fast or irregular heartbeat, increased sensitivity to cold or heat, excessive sweating, constipation, hair loss, increased thirst or amount of  urine, tremors or shaking, irritability Infusion reactions--chest pain, shortness of breath or trouble breathing, feeling faint or lightheaded Kidney injury (glomerulonephritis)--decrease in the amount of urine, red or dark brown urine, foamy or bubbly urine, swelling of the ankles, hands, or feet Liver injury--right upper belly pain, loss of appetite, nausea, light-colored stool, dark yellow or brown urine, yellowing skin or eyes, unusual weakness or fatigue Pain, tingling, or numbness in the hands or feet, muscle weakness, change in vision, confusion or trouble speaking, loss of balance or coordination, trouble walking, seizures Rash, fever, and swollen lymph nodes Redness, blistering, peeling, or loosening of the skin, including inside the mouth Sudden or severe stomach pain, bloody diarrhea, fever, nausea, vomiting Side effects that usually do not require medical attention (report these to your care team if they continue or are bothersome): Bone, joint, or muscle pain Diarrhea Fatigue Loss of appetite Nausea Skin rash This list may not describe all possible side effects. Call your doctor for medical advice about side effects. You may report side effects to FDA at 1-800-FDA-1088. Where should I keep my medication? This medication is given in a hospital or clinic. It will not be stored at home. NOTE: This sheet is a summary. It may not cover all possible information. If you have questions about this medicine, talk to your doctor, pharmacist, or health care provider.  2024 Elsevier/Gold Standard (2021-06-29 00:00:00)  

## 2022-10-20 ENCOUNTER — Other Ambulatory Visit: Payer: Self-pay

## 2022-10-20 LAB — T4: T4, Total: 6.5 ug/dL (ref 4.5–12.0)

## 2022-10-20 LAB — RHEUMATOID FACTOR: Rheumatoid fact SerPl-aCnc: 12.8 [IU]/mL (ref <14.0)

## 2022-10-23 ENCOUNTER — Other Ambulatory Visit: Payer: Self-pay

## 2022-10-26 ENCOUNTER — Encounter: Payer: Self-pay | Admitting: Oncology

## 2022-10-29 ENCOUNTER — Other Ambulatory Visit: Payer: Self-pay

## 2022-11-07 ENCOUNTER — Encounter: Payer: Self-pay | Admitting: Oncology

## 2022-11-10 ENCOUNTER — Encounter: Payer: Self-pay | Admitting: Dermatology

## 2022-11-10 ENCOUNTER — Ambulatory Visit (INDEPENDENT_AMBULATORY_CARE_PROVIDER_SITE_OTHER): Payer: Managed Care, Other (non HMO) | Admitting: Dermatology

## 2022-11-10 VITALS — BP 154/90 | HR 54

## 2022-11-10 DIAGNOSIS — Z1211 Encounter for screening for malignant neoplasm of colon: Secondary | ICD-10-CM | POA: Insufficient documentation

## 2022-11-10 DIAGNOSIS — D485 Neoplasm of uncertain behavior of skin: Secondary | ICD-10-CM

## 2022-11-10 DIAGNOSIS — L570 Actinic keratosis: Secondary | ICD-10-CM | POA: Diagnosis not present

## 2022-11-10 DIAGNOSIS — C4441 Basal cell carcinoma of skin of scalp and neck: Secondary | ICD-10-CM | POA: Diagnosis not present

## 2022-11-10 DIAGNOSIS — C228 Malignant neoplasm of liver, primary, unspecified as to type: Secondary | ICD-10-CM | POA: Insufficient documentation

## 2022-11-10 DIAGNOSIS — C4491 Basal cell carcinoma of skin, unspecified: Secondary | ICD-10-CM

## 2022-11-10 HISTORY — DX: Basal cell carcinoma of skin, unspecified: C44.91

## 2022-11-10 MED ORDER — FLUOROURACIL 5 % EX CREA
TOPICAL_CREAM | Freq: Two times a day (BID) | CUTANEOUS | 2 refills | Status: DC
Start: 2022-11-10 — End: 2023-04-04

## 2022-11-10 NOTE — Patient Instructions (Addendum)
Efudex Instructions 1. Apply a thin layer of Efudex cream to the specified sun  damaged area twice a day. The medication is to be applied to the entire  area, not just the keratoses (rough spots). Wash hands thoroughly after  application! 2. Use Efudex twice daily for 2 weeks to 4 weeks (depending on the  physician directions) or once a day for 4 weeks (depending on the  physician directions).  3. The skin will become red and scaly. This is the appropriate response.  4. Once redness starts, you may begin using Hydrocortisone 1% Cream (or  other topical steroid provided by the physician) twice a day. This helps  relieve pain and will speed the healing process. It may be applied as soon  as 30 minutes after Efudex application. 5. For excessive reactions or if suspicious for an infection or allergic  reaction, please call to make an appointment at my office so that I may  evaluate your response to treatment. 6. The area may be washed as you normally would with soap and water. Do  not wash the face for at least three hours after Efudex  application. 7. Ladies: Chrystie Nose is permitted throughout the entire treatment period. Wait  30 minutes after Efudex use before applying makeup directly on top of the  medication. 8. Please read the instructional pamphlet provided by the [employer] so that you may familiarize yourself with the kinds of reactions  you may experience. 9. If you have had a biopsy on any area that you will use the medication on,  DO NOT start the Efudex until your results are given to you and  the medical assistant lets you know it is okay to start the treatment    Cryotherapy Aftercare  Wash gently with soap and water everyday.   Apply Vaseline and Band-Aid daily until healed.   Patient Handout: Wound Care for Skin Biopsy Site  Taking Care of Your Skin Biopsy Site  Proper care of the biopsy site is essential for promoting healing and minimizing scarring. This  handout provides instructions on how to care for your biopsy site to ensure optimal recovery.  1. Cleaning the Wound:  Clean the biopsy site daily with gentle soap and water. Gently pat the area dry with a clean, soft towel. Avoid harsh scrubbing or rubbing the area, as this can irritate the skin and delay healing.  2. Applying Aquaphor and Bandage:  After cleaning the wound, apply a thin layer of Aquaphor ointment to the biopsy site. Cover the area with a sterile bandage to protect it from dirt, bacteria, and friction. Change the bandage daily or as needed if it becomes soiled or wet.  3. Continued Care for One Week:  Repeat the cleaning, Aquaphor application, and bandaging process daily for one week following the biopsy procedure. Keeping the wound clean and moist during this initial healing period will help prevent infection and promote optimal healing.  4. Massaging Aquaphor into the Area:  ---After one week, discontinue the use of bandages but continue to apply Aquaphor to the biopsy site. ----Gently massage the Aquaphor into the area using circular motions. ---Massaging the skin helps to promote circulation and prevent the formation of scar tissue.   Additional Tips:  Avoid exposing the biopsy site to direct sunlight during the healing process, as this can cause hyperpigmentation or worsen scarring. If you experience any signs of infection, such as increased redness, swelling, warmth, or drainage from the wound, contact your healthcare provider  immediately. Follow any additional instructions provided by your healthcare provider for caring for the biopsy site and managing any discomfort. Conclusion:  Taking proper care of your skin biopsy site is crucial for ensuring optimal healing and minimizing scarring. By following these instructions for cleaning, applying Aquaphor, and massaging the area, you can promote a smooth and successful recovery. If you have any questions or  concerns about caring for your biopsy site, don't hesitate to contact your healthcare provider for guidance.    Important Information   Due to recent changes in healthcare laws, you may see results of your pathology and/or laboratory studies on MyChart before the doctors have had a chance to review them. We understand that in some cases there may be results that are confusing or concerning to you. Please understand that not all results are received at the same time and often the doctors may need to interpret multiple results in order to provide you with the best plan of care or course of treatment. Therefore, we ask that you please give [place] 2 business days to thoroughly review all your results before contacting the office for clarification. Should we see a critical lab result, you will be contacted sooner.     If You Need Anything After Your Visit   If you have any questions or concerns for your doctor, please call our main line at [phone]. If no one answers, please leave a voicemail as directed and we will return your call as soon as possible. Messages left after 4 pm will be answered the following business day.    You may also send [place] a message via MyChart. We typically respond to MyChart messages within 1-2 business days.  For prescription refills, please ask your pharmacy to contact our office. Our fax number is [phone].  If you have an urgent issue when the clinic is closed that cannot wait until the next business day, you can page your doctor at the number below.     Please note that while we do our best to be available for urgent issues outside of office hours, we are not available 24/7.    If you have an urgent issue and are unable to reach [place], you may choose to seek medical care at your doctor's office, retail clinic, urgent care center, or emergency room.   If you have a medical emergency, please immediately call 911 or go to the emergency department. In the event of  inclement weather, please call our main line at [phone] for an update on the status of any delays or closures.  Dermatology Medication Tips: Please keep the boxes that topical medications come in in order to help keep track of the instructions about where and how to use these. Pharmacies typically print the medication instructions only on the boxes and not directly on the medication tubes.   If your medication is too expensive, please contact our office at [phone] or send [place] a message through MyChart.    We are unable to tell what your co-pay for medications will be in advance as this is different depending on your insurance coverage. However, we may be able to find a substitute medication at lower cost or fill out paperwork to get insurance to cover a needed medication.    If a prior authorization is required to get your medication covered by your insurance company, please allow [place] 1-2 business days to complete this process.   Drug prices often vary depending on where the prescription is filled  and some pharmacies may offer cheaper prices.   The website www.goodrx.com contains coupons for medications through different pharmacies. The prices here do not account for what the cost may be with help from insurance (it may be cheaper with your insurance), but the website can give you the price if you did not use any insurance.  - You can print the associated coupon and take it with your prescription to the pharmacy.  - You may also stop by our office during regular business hours and pick up a GoodRx coupon card.  - If you need your prescription sent electronically to a different pharmacy, notify our office through [facility] or by phone at [phone]

## 2022-11-10 NOTE — Progress Notes (Signed)
   New Patient Visit   Subjective  Jeffrey Blevins is a 57 y.o. male accompanied by wife who presents for the following: Spot Check  Patient states he has spots located at the face that he would like to have examined. Patient reports the areas have been there for 3 months. He reports the areas are not bothersome.Patient rates irritation 0 out of 10. He states that the areas have not spread. Patient reports he  has not previously been treated for these areas. Patient denies Hx of bx. Patient denies family history of skin cancer(s).  The patient has spots, moles and lesions to be evaluated, some may be new or changing and the patient may have concern these could be cancer.  The following portions of the chart were reviewed this encounter and updated as appropriate: medications, allergies, medical history  Review of Systems:  No other skin or systemic complaints except as noted in HPI or Assessment and Plan.  Objective  Well appearing patient in no apparent distress; mood and affect are within normal limits.  A focused examination was performed of the following areas: Face  Relevant exam findings are noted in the Assessment and Plan.  Right Preauricular Area Pink papule with hemorraghic crusting arborizing   Left Nasal Sidewall, Right Zygomatic Area Erythematous thin papules/macules with gritty scale.             Assessment & Plan   Neoplasm of uncertain behavior of skin Right Preauricular Area  Skin / nail biopsy Type of biopsy: tangential   Informed consent: discussed and consent obtained   Timeout: patient name, date of birth, surgical site, and procedure verified   Instrument used: DermaBlade   Hemostasis achieved with: aluminum chloride   Outcome: patient tolerated procedure well   Post-procedure details: wound care instructions given    Specimen 1 - Surgical pathology Differential Diagnosis: BCC  Check Margins: No  Actinic keratoses (2) Left Nasal Sidewall; Right  Zygomatic Area  Destruction of lesion - Left Nasal Sidewall, Right Zygomatic Area Complexity: simple   Destruction method: cryotherapy   Informed consent: discussed and consent obtained   Timeout:  patient name, date of birth, surgical site, and procedure verified Outcome: patient tolerated procedure well with no complications   Post-procedure details: wound care instructions given      No follow-ups on file.  Documentation: I have reviewed the above documentation for accuracy and completeness, and I agree with the above.  I, Nell Range, am acting as scribe for Gwenith Daily, MD.  Gwenith Daily, MD

## 2022-11-12 ENCOUNTER — Other Ambulatory Visit: Payer: Self-pay

## 2022-11-14 LAB — SURGICAL PATHOLOGY

## 2022-11-15 ENCOUNTER — Encounter: Payer: Self-pay | Admitting: Dermatology

## 2022-11-16 ENCOUNTER — Inpatient Hospital Stay: Payer: Managed Care, Other (non HMO) | Admitting: Nurse Practitioner

## 2022-11-16 ENCOUNTER — Encounter: Payer: Self-pay | Admitting: Oncology

## 2022-11-16 ENCOUNTER — Inpatient Hospital Stay: Payer: Managed Care, Other (non HMO)

## 2022-11-16 ENCOUNTER — Inpatient Hospital Stay: Payer: Managed Care, Other (non HMO) | Attending: Oncology

## 2022-11-16 ENCOUNTER — Encounter: Payer: Self-pay | Admitting: Nurse Practitioner

## 2022-11-16 ENCOUNTER — Telehealth: Payer: Self-pay

## 2022-11-16 VITALS — BP 156/90 | HR 62 | Temp 98.2°F | Resp 18 | Ht 75.0 in | Wt 214.0 lb

## 2022-11-16 VITALS — BP 157/104 | HR 50 | Resp 18

## 2022-11-16 DIAGNOSIS — C221 Intrahepatic bile duct carcinoma: Secondary | ICD-10-CM

## 2022-11-16 DIAGNOSIS — Z79620 Long term (current) use of immunosuppressive biologic: Secondary | ICD-10-CM | POA: Insufficient documentation

## 2022-11-16 DIAGNOSIS — Z5112 Encounter for antineoplastic immunotherapy: Secondary | ICD-10-CM | POA: Diagnosis present

## 2022-11-16 LAB — CBC WITH DIFFERENTIAL (CANCER CENTER ONLY)
Abs Immature Granulocytes: 0.01 10*3/uL (ref 0.00–0.07)
Basophils Absolute: 0.1 10*3/uL (ref 0.0–0.1)
Basophils Relative: 1 %
Eosinophils Absolute: 0.4 10*3/uL (ref 0.0–0.5)
Eosinophils Relative: 5 %
HCT: 41.1 % (ref 39.0–52.0)
Hemoglobin: 14.3 g/dL (ref 13.0–17.0)
Immature Granulocytes: 0 %
Lymphocytes Relative: 37 %
Lymphs Abs: 2.5 10*3/uL (ref 0.7–4.0)
MCH: 29.3 pg (ref 26.0–34.0)
MCHC: 34.8 g/dL (ref 30.0–36.0)
MCV: 84.2 fL (ref 80.0–100.0)
Monocytes Absolute: 0.4 10*3/uL (ref 0.1–1.0)
Monocytes Relative: 6 %
Neutro Abs: 3.4 10*3/uL (ref 1.7–7.7)
Neutrophils Relative %: 51 %
Platelet Count: 160 10*3/uL (ref 150–400)
RBC: 4.88 MIL/uL (ref 4.22–5.81)
RDW: 13.3 % (ref 11.5–15.5)
WBC Count: 6.8 10*3/uL (ref 4.0–10.5)
nRBC: 0 % (ref 0.0–0.2)

## 2022-11-16 LAB — CMP (CANCER CENTER ONLY)
ALT: 22 U/L (ref 0–44)
AST: 22 U/L (ref 15–41)
Albumin: 4.2 g/dL (ref 3.5–5.0)
Alkaline Phosphatase: 88 U/L (ref 38–126)
Anion gap: 7 (ref 5–15)
BUN: 18 mg/dL (ref 6–20)
CO2: 26 mmol/L (ref 22–32)
Calcium: 9.2 mg/dL (ref 8.9–10.3)
Chloride: 106 mmol/L (ref 98–111)
Creatinine: 1 mg/dL (ref 0.61–1.24)
GFR, Estimated: >60 mL/min (ref >60)
Glucose, Bld: 96 mg/dL (ref 70–99)
Potassium: 4.2 mmol/L (ref 3.5–5.1)
Sodium: 139 mmol/L (ref 135–145)
Total Bilirubin: 0.6 mg/dL (ref 0.3–1.2)
Total Protein: 7.1 g/dL (ref 6.5–8.1)

## 2022-11-16 MED ORDER — HEPARIN SOD (PORK) LOCK FLUSH 100 UNIT/ML IV SOLN
500.0000 [IU] | Freq: Once | INTRAVENOUS | Status: AC | PRN
  Administered 2022-11-16: 500 [IU]

## 2022-11-16 MED ORDER — SODIUM CHLORIDE 0.9% FLUSH
10.0000 mL | INTRAVENOUS | Status: DC | PRN
  Administered 2022-11-16: 10 mL

## 2022-11-16 MED ORDER — SODIUM CHLORIDE 0.9 % IV SOLN
1500.0000 mg | Freq: Once | INTRAVENOUS | Status: AC
  Administered 2022-11-16: 1500 mg via INTRAVENOUS
  Filled 2022-11-16: qty 30

## 2022-11-16 MED ORDER — SODIUM CHLORIDE 0.9 % IV SOLN: Freq: Once | INTRAVENOUS | Status: AC

## 2022-11-16 NOTE — Progress Notes (Signed)
Patient seen by Lonna Cobb NP today  Vitals are within treatment parameters.  Labs reviewed by Lonna Cobb NP and are within treatment parameters.  Per physician team, patient is ready for treatment and there are NO modifications to the treatment plan.

## 2022-11-16 NOTE — Telephone Encounter (Signed)
-----   Message from [facility] PACI sent at 11/15/2022  1:10 PM EDT ----- [facility] on right preauricular region, will need Mohs with me. I think he may already be scheduled but please double check.  Please call patient to discuss diagnosis and schedule for Mohs surgery.

## 2022-11-16 NOTE — Telephone Encounter (Signed)
Spoke with pt and gave him bx results. Pt already sched for mohs

## 2022-11-16 NOTE — Progress Notes (Signed)
[facility] OFFICE PROGRESS NOTE   Diagnosis: Cholangiocarcinoma  INTERVAL HISTORY:   [name] returns as scheduled.  He completed another cycle of Durvalumab 10/19/2022.  No diarrhea.  Since his last visit he developed a pruritic rash at the lower legs after walking in tall grass.  He thinks he may have "chiggers".  The tiny lesions are healing and the pruritus is better.  No nausea or vomiting.  No balance issues.  No falls.  No cough or shortness of breath.  Finger joints continue to be painful mainly first thing in the morning, improves as the day progresses.  For the past 3 weeks his wife has noticed intermittent involuntary movements involving the left side of his face.  Objective:  Vital signs in last 24 hours:  Blood pressure (!) 156/90, pulse 62, temperature 98.2 F (36.8 C), temperature source Skin, resp. rate 18, height 6\' 3"  (1.905 m), weight 214 lb (97.1 kg), SpO2 99%.    HEENT: No thrush or ulcers.   Resp: Lungs clear bilaterally. Cardio: Regular rate and rhythm. GI: No hepatosplenomegaly.  Tender right upper abdomen along the scar. Vascular: No leg edema. Neuro: Alert and oriented.  Motor send 5/5.  Follows commands.  Pupils equal round reactive to light.  Extraocular movements intact.  Face is symmetric.  Tongue midline. Musculoskeletal: Intermittent involuntary spasm of the left lower face. Skin: Healing probable insect bites at the lower legs. Port-A-Cath without erythema.  Lab Results:  Lab Results  Component Value Date   WBC 6.8 11/16/2022   HGB 14.3 11/16/2022   HCT 41.1 11/16/2022   MCV 84.2 11/16/2022   PLT 160 11/16/2022   NEUTROABS 3.4 11/16/2022    Imaging:  No results found.  Medications: I have reviewed the patient's current medications.  Assessment/Plan: Liver mass concerning for malignancy -CT abdomen/pelvis with contrast 08/14/1998 23-2.9 x 2.7 x 2.7 cm hypoenhancing mass in segment 5 near the liver hilum just above hepatic  ductal confluence suspicious for primary bile duct malignancy or primary hepatic malignancy -MRCP 08/14/2021-irregular mass of segment 8 of the liver with perilesional enhancement concerning for cholangiocarcinoma -08/14/2021 CA 19.9 was elevated at 76 -08/16/2021 AFP 6.4 -08/16/2021 cytology from bile duct brushing showed atypical cells -08/19/2021 cytology from bile duct brushing showed cells suspicious for malignancy -08/25/2021 CT-guided liver biopsy-moderately differentiated adenocarcinoma, CK7 positive, CDX2 positive in a small population of cells, TTF-1 negative, CK20 negative-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 -CTs at [facility] 09/06/2021-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, occlusion of the anterior branch of the right portal vein, periportal enhancement and thickening on delayed imaging potentially due to tumor infiltration, no change in the 2 x 3.2 cm mass in segment 8, mild to moderate left and right intrahepatic biliary dilatation, biliary stent in place, small portacaval and periportal nodes, no evidence of metastatic disease in the chest -Cycle 1 gemcitabine/cisplatin/durvalumab 10/01/2021 -Cycle 2 gemcitabine/cisplatin/durvalumab 10/22/2021 -Cycle 3 gemcitabine/cisplatin/durvalumab 11/11/2021, D1 cisplatin held due to neuropathy, cisplatin resumed day 8 -Cycle 4 gemcitabine/cisplatin/durvalumab 12/03/2021 -MRI abdomen 12/15/2021-segment 8 cholangiocarcinoma no longer measurable, residual biliary duct dilation in the anterior right and left hepatic lobes, no evidence of metastatic disease persistent anterior right and new peripheral left portal vein occlusion -Cycle 5 gemcitabine/cisplatin/durvalumab 12/24/2021 -Cycle 6 gemcitabine/cisplatin/durvalumab 01/13/2022 -CTs at [facility] 02/14/2022-fine hypoattenuating masses in the left and right hepatic lobes, more prominent compared to the 12/15/2021 MRI, potentially representing  multifocal cholangiocarcinoma, persistent occlusion of left portal and anterior branch of the right portal  vein, no extrahepatic lymphadenopathy or metastatic disease -MRI liver 12/19 /2023-1.3 x 0.8 cm hypoenhancing lesion in segment 8, ill-defined area of delayed enhancement in the caudate measuring 1.7 cm, no left liver lesion, 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  Partial hepatectomy anatomy.  Marked improvement postsurgical fluid collections.  Biliary stent in place.  Mild biliary duct dilatation in the central hepatic lobes.  No evidence of metastatic disease in the abdomen/pelvis. -07/27/2022 continue every 4-week Durvalumab -CTs 10/18/2022-partial left hepatectomy, common hepatic and bile duct stent unchanged with unchanged segmental intrahepatic biliary duct dilation in the remnant left lobe and anterior right lobe.  No evidence of metastatic disease in the chest, abdomen, or pelvis.  Unchanged 0.5 cm subpleural nodule in the right middle lobe -10/19/2022 continue every 4-week Durvalumab 2.  Obstructive jaundice -08/16/2021 percutaneous biliary drain placement 3.  Hepatic steatosis 4.  GERD 5.  Hypertension 6.  Normocytic anemia 7.  Enlarged prostate seen on CT 8.  Tobacco use 9.  Enterococcus and Aerococcus bacteremia 08/20/2021-discharged 08/26/2021 to complete outpatient course of Augmentin 10.  Peripheral neuropathy 11.  Port-A-Cath placement 09/23/2021 12.  Biliary drain exchange  09/23/2021; drain capped 10/12/2021 13.  Rigors following biliary drain procedure 09/23/2021, biliary drain culture-Enterobacter Cloacae, Bactrim x7 days 09/27/2021 14.  Right leg edema and pain 10/18/2021-Doppler negative for DVT 15.  Admission 03/18/2022 with a right upper quadrant abscess, status post drain placement, culture positive for Klebsiella CTs 03/21/2022-no change in moderate volume ascites, interval decrease in subhepatic air-filled collection with catheter in appropriate position, resolved right pneumothorax, no change in bilateral pleural effusions-moderate on right and small on left CTs 04/25/2022 at [facility]-3.1 x 2.0 cm fluid collection in the abdomen and pelvis, decreased in volume and increased in organization; resolution of subhepatic collection; increased partially visualized large right pleural effusion with near complete collapse of the right lower lobe. Drainage catheter removed 04/25/2022 16.  Large right pleural effusion on CT 04/25/2022-thoracentesis 04/29/2022, 1.75 L of pleural fluid removed; culture negative, cytology with reactive mesothelial cells present, acute inflammation    Disposition: [name] appears stable.  He is receiving Durvalumab maintenance every 4 weeks.  He continues to tolerate well.  Plan to proceed with treatment today as scheduled.  CBC and chemistry panel reviewed.  Labs adequate to proceed as above.  Over the last 3 to 4 weeks he has been experiencing involuntary spasm like movement of the left lower face.  We referred him for a brain MRI.  He will return for follow-up as scheduled in 4 weeks.  We are available to see him sooner if needed.   Lonna Cobb ANP/GNP-BC   11/16/2022  10:09 AM

## 2022-11-16 NOTE — Patient Instructions (Signed)
[facility]   Discharge Instructions: Thank you for choosing [facility] to provide your oncology and hematology care.   If you have a lab appointment with the Cancer Center, please go directly to the Cancer Center and check in at the registration area.   Wear comfortable clothing and clothing appropriate for easy access to any Portacath or PICC line.   We strive to give you quality time with your provider. You may need to reschedule your appointment if you arrive late (15 or more minutes).  Arriving late affects you and other patients whose appointments are after yours.  Also, if you miss three or more appointments without notifying the office, you may be dismissed from the clinic at the provider's discretion.      For prescription refill requests, have your pharmacy contact our office and allow 72 hours for refills to be completed.    Today you received the following chemotherapy and/or immunotherapy agents Durvalumab (IMFINZI).      To help prevent nausea and vomiting after your treatment, we encourage you to take your nausea medication as directed.  BELOW ARE SYMPTOMS THAT SHOULD BE REPORTED IMMEDIATELY: *FEVER GREATER THAN 100.4 F (38 C) OR HIGHER *CHILLS OR SWEATING *NAUSEA AND VOMITING THAT IS NOT CONTROLLED WITH YOUR NAUSEA MEDICATION *UNUSUAL SHORTNESS OF BREATH *UNUSUAL BRUISING OR BLEEDING *URINARY PROBLEMS (pain or burning when urinating, or frequent urination) *BOWEL PROBLEMS (unusual diarrhea, constipation, pain near the anus) TENDERNESS IN MOUTH AND THROAT WITH OR WITHOUT PRESENCE OF ULCERS (sore throat, sores in mouth, or a toothache) UNUSUAL RASH, SWELLING OR PAIN  UNUSUAL VAGINAL DISCHARGE OR ITCHING   Items with * indicate a potential emergency and should be followed up as soon as possible or go to the Emergency Department if any problems should occur.  Please show the CHEMOTHERAPY ALERT CARD or IMMUNOTHERAPY ALERT  CARD at check-in to the Emergency Department and triage nurse.  Should you have questions after your visit or need to cancel or reschedule your appointment, please contact [facility]  Dept: [phone]  and follow the prompts.  Office hours are 8:00 a.m. to 4:30 p.m. Monday - Friday. Please note that voicemails left after 4:00 p.m. may not be returned until the following business day.  We are closed weekends and major holidays. You have access to a nurse at all times for urgent questions. Please call the main number to the clinic Dept: [phone] and follow the prompts.   For any non-urgent questions, you may also contact your provider using MyChart. We now offer e-Visits for anyone 63 and older to request care online for non-urgent symptoms. For details visit [email or web address].   Also download the MyChart app! Go to the app store, search "MyChart", open the app, select [place], and log in with your MyChart username and password.  Durvalumab Injection What is this medication? DURVALUMAB (dur VAL ue mab) treats some types of cancer. It works by helping your immune system slow or stop the spread of cancer cells. It is a monoclonal antibody. This medicine may be used for other purposes; ask your health care provider or pharmacist if you have questions. COMMON BRAND NAME(S): IMFINZI What should I tell my care team before I take this medication? They need to know if you have any of these conditions: Allogeneic stem cell transplant (uses someone else's stem cells) Autoimmune diseases, such as Crohn disease, ulcerative colitis, lupus History of chest radiation Nervous system  problems, such as Guillain-Barre syndrome, myasthenia gravis Organ transplant An unusual or allergic reaction to durvalumab, other medications, foods, dyes, or preservatives Pregnant or trying to get pregnant Breast-feeding How should I use this medication? This medication is  infused into a vein. It is given by your care team in a hospital or clinic setting. A special MedGuide will be given to you before each treatment. Be sure to read this information carefully each time. Talk to your care team about the use of this medication in children. Special care may be needed. Overdosage: If you think you have taken too much of this medicine contact a poison control center or emergency room at once. NOTE: This medicine is only for you. Do not share this medicine with others. What if I miss a dose? Keep appointments for follow-up doses. It is important not to miss your dose. Call your care team if you are unable to keep an appointment. What may interact with this medication? Interactions have not been studied. This list may not describe all possible interactions. Give your health care provider a list of all the medicines, herbs, non-prescription drugs, or dietary supplements you use. Also tell them if you smoke, drink alcohol, or use illegal drugs. Some items may interact with your medicine. What should I watch for while using this medication? Your condition will be monitored carefully while you are receiving this medication. You may need blood work while taking this medication. This medication may cause serious skin reactions. They can happen weeks to months after starting the medication. Contact your care team right away if you notice fevers or flu-like symptoms with a rash. The rash may be red or purple and then turn into blisters or peeling of the skin. You may also notice a red rash with swelling of the face, lips, or lymph nodes in your neck or under your arms. Tell your care team right away if you have any change in your eyesight. Talk to your care team if you may be pregnant. Serious birth defects can occur if you take this medication during pregnancy and for 3 months after the last dose. You will need a negative pregnancy test before starting this medication. Contraception  is recommended while taking this medication and for 3 months after the last dose. Your care team can help you find the option that works for you. Do not breastfeed while taking this medication and for 3 months after the last dose. What side effects may I notice from receiving this medication? Side effects that you should report to your care team as soon as possible: Allergic reactions--skin rash, itching, hives, swelling of the face, lips, tongue, or throat Dry cough, shortness of breath or trouble breathing Eye pain, redness, irritation, or discharge with blurry or decreased vision Heart muscle inflammation--unusual weakness or fatigue, shortness of breath, chest pain, fast or irregular heartbeat, dizziness, swelling of the ankles, feet, or hands Hormone gland problems--headache, sensitivity to light, unusual weakness or fatigue, dizziness, fast or irregular heartbeat, increased sensitivity to cold or heat, excessive sweating, constipation, hair loss, increased thirst or amount of urine, tremors or shaking, irritability Infusion reactions--chest pain, shortness of breath or trouble breathing, feeling faint or lightheaded Kidney injury (glomerulonephritis)--decrease in the amount of urine, red or dark brown urine, foamy or bubbly urine, swelling of the ankles, hands, or feet Liver injury--right upper belly pain, loss of appetite, nausea, light-colored stool, dark yellow or brown urine, yellowing skin or eyes, unusual weakness or fatigue Pain, tingling, or numbness  in the hands or feet, muscle weakness, change in vision, confusion or trouble speaking, loss of balance or coordination, trouble walking, seizures Rash, fever, and swollen lymph nodes Redness, blistering, peeling, or loosening of the skin, including inside the mouth Sudden or severe stomach pain, bloody diarrhea, fever, nausea, vomiting Side effects that usually do not require medical attention (report these to your care team if they  continue or are bothersome): Bone, joint, or muscle pain Diarrhea Fatigue Loss of appetite Nausea Skin rash This list may not describe all possible side effects. Call your doctor for medical advice about side effects. You may report side effects to FDA at 1-800-FDA-1088. Where should I keep my medication? This medication is given in a hospital or clinic. It will not be stored at home. NOTE: This sheet is a summary. It may not cover all possible information. If you have questions about this medicine, talk to your doctor, pharmacist, or health care provider.  2024 Elsevier/Gold Standard (2021-06-29 00:00:00)

## 2022-11-17 ENCOUNTER — Other Ambulatory Visit: Payer: Self-pay

## 2022-11-24 ENCOUNTER — Encounter: Payer: Self-pay | Admitting: Oncology

## 2022-11-24 ENCOUNTER — Telehealth: Payer: Self-pay

## 2022-11-24 NOTE — Telephone Encounter (Signed)
Notified Patient of completion of Disability forms. Fax transmission confirmation received. Request for medical records forwarded to [facility] Information Management Office as an urgent request. Copy of forms mailed to Patient as requested. No other needs or concerns voiced at this time.

## 2022-11-29 ENCOUNTER — Ambulatory Visit (HOSPITAL_COMMUNITY)
Admission: RE | Admit: 2022-11-29 | Discharge: 2022-11-29 | Disposition: A | Discharge status: Home / Self Care | Payer: Managed Care, Other (non HMO) | Source: Ambulatory Visit | Attending: Nurse Practitioner | Admitting: Nurse Practitioner

## 2022-11-29 DIAGNOSIS — C221 Intrahepatic bile duct carcinoma: Secondary | ICD-10-CM | POA: Diagnosis present

## 2022-11-29 MED ORDER — GADOBUTROL 1 MMOL/ML IV SOLN
10.0000 mL | Freq: Once | INTRAVENOUS | Status: AC | PRN
  Administered 2022-11-29: 10 mL via INTRAVENOUS

## 2022-11-30 ENCOUNTER — Telehealth: Payer: Self-pay

## 2022-11-30 ENCOUNTER — Ambulatory Visit (HOSPITAL_BASED_OUTPATIENT_CLINIC_OR_DEPARTMENT_OTHER)
Admission: RE | Admit: 2022-11-30 | Discharge: 2022-11-30 | Disposition: A | Discharge status: Home / Self Care | Payer: Managed Care, Other (non HMO) | Source: Ambulatory Visit | Attending: Nurse Practitioner | Admitting: Nurse Practitioner

## 2022-11-30 ENCOUNTER — Encounter: Payer: Self-pay | Admitting: Nurse Practitioner

## 2022-11-30 ENCOUNTER — Inpatient Hospital Stay: Payer: Managed Care, Other (non HMO) | Attending: Oncology | Admitting: Nurse Practitioner

## 2022-11-30 VITALS — BP 150/86 | HR 60 | Temp 98.3°F | Resp 18 | Ht 75.0 in | Wt 218.4 lb

## 2022-11-30 DIAGNOSIS — Z5112 Encounter for antineoplastic immunotherapy: Secondary | ICD-10-CM | POA: Insufficient documentation

## 2022-11-30 DIAGNOSIS — D649 Anemia, unspecified: Secondary | ICD-10-CM | POA: Insufficient documentation

## 2022-11-30 DIAGNOSIS — C221 Intrahepatic bile duct carcinoma: Secondary | ICD-10-CM | POA: Insufficient documentation

## 2022-11-30 DIAGNOSIS — Z79620 Long term (current) use of immunosuppressive biologic: Secondary | ICD-10-CM | POA: Insufficient documentation

## 2022-11-30 NOTE — Progress Notes (Signed)
[facility] OFFICE PROGRESS NOTE   Diagnosis: Cholangiocarcinoma  INTERVAL HISTORY:   Jeffrey Blevins is seen prior to scheduled follow-up for evaluation of dyspnea.  For about the past week he has noticed waking up short of breath.  He feels better when he sleeps on 2 pillows.  He notes dyspnea on exertion during the day.  No cough or fever.  No leg swelling or calf pain.  No chest pain.  He continues to note involuntary muscle movement at the left lower face.  Objective:  Vital signs in last 24 hours:  Blood pressure (!) 150/86, pulse 60, temperature 98.3 F (36.8 C), temperature source Oral, resp. rate 18, height 6\' 3"  (1.905 m), weight 218 lb 6.4 oz (99.1 kg), SpO2 100%.    Resp: Distant breath sounds, lungs clear bilaterally.  No respiratory distress. Cardio: Regular rate and rhythm.  Variable mild neck vein distention. GI: Abdomen soft and nontender.  No hepatosplenomegaly. Vascular: No upper or lower extremity edema.  No venous engorgement across the upper chest. Neuro: Alert and oriented. Skin: Multiple raised scabbed lesions mainly along the right side of the face (recent dermatology treatment).   Lab Results:  Lab Results  Component Value Date   WBC 6.8 11/16/2022   HGB 14.3 11/16/2022   HCT 41.1 11/16/2022   MCV 84.2 11/16/2022   PLT 160 11/16/2022   NEUTROABS 3.4 11/16/2022    Imaging:  No results found.  Medications: I have reviewed the patient's current medications.  Assessment/Plan: Liver mass concerning for malignancy -CT abdomen/pelvis with contrast 08/14/1998 23-2.9 x 2.7 x 2.7 cm hypoenhancing mass in segment 5 near the liver hilum just above hepatic ductal confluence suspicious for primary bile duct malignancy or primary hepatic malignancy -MRCP 08/14/2021-irregular mass of segment 8 of the liver with perilesional enhancement concerning for cholangiocarcinoma -08/14/2021 CA 19.9 was elevated at 76 -08/16/2021 AFP 6.4 -08/16/2021 cytology from  bile duct brushing showed atypical cells -08/19/2021 cytology from bile duct brushing showed cells suspicious for malignancy -08/25/2021 CT-guided liver biopsy-moderately differentiated adenocarcinoma, CK7 positive, CDX2 positive in a small population of cells, TTF-1 negative, CK20 negative-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 -CTs at [facility] 09/06/2021-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, occlusion of the anterior branch of the right portal vein, periportal enhancement and thickening on delayed imaging potentially due to tumor infiltration, no change in the 2 x 3.2 cm mass in segment 8, mild to moderate left and right intrahepatic biliary dilatation, biliary stent in place, small portacaval and periportal nodes, no evidence of metastatic disease in the chest -Cycle 1 gemcitabine/cisplatin/durvalumab 10/01/2021 -Cycle 2 gemcitabine/cisplatin/durvalumab 10/22/2021 -Cycle 3 gemcitabine/cisplatin/durvalumab 11/11/2021, D1 cisplatin held due to neuropathy, cisplatin resumed day 8 -Cycle 4 gemcitabine/cisplatin/durvalumab 12/03/2021 -MRI abdomen 12/15/2021-segment 8 cholangiocarcinoma no longer measurable, residual biliary duct dilation in the anterior right and left hepatic lobes, no evidence of metastatic disease persistent anterior right and new peripheral left portal vein occlusion -Cycle 5 gemcitabine/cisplatin/durvalumab 12/24/2021 -Cycle 6 gemcitabine/cisplatin/durvalumab 01/13/2022 -CTs at [facility] 02/14/2022-fine hypoattenuating masses in the left and right hepatic lobes, more prominent compared to the 12/15/2021 MRI, potentially representing multifocal cholangiocarcinoma, persistent occlusion of left portal and anterior branch of the right portal vein, no extrahepatic lymphadenopathy or metastatic disease -MRI liver 12/19 /2023-1.3 x 0.8 cm hypoenhancing lesion in segment 8, ill-defined area of delayed enhancement in the caudate  measuring 1.7 cm, no left liver lesion, 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  Partial hepatectomy anatomy.  Marked improvement postsurgical fluid collections.  Biliary stent in place.  Mild biliary duct dilatation in the central hepatic lobes.  No evidence of metastatic disease in the abdomen/pelvis. -07/27/2022 continue every 4-week Durvalumab -CTs 10/18/2022-partial left hepatectomy, common hepatic and bile duct stent unchanged with unchanged segmental intrahepatic biliary duct dilation in the remnant left lobe and anterior right lobe.  No evidence of metastatic disease in the chest, abdomen, or pelvis.  Unchanged 0.5 cm subpleural nodule in the right middle lobe -10/19/2022 continue every 4-week Durvalumab 2.  Obstructive jaundice -08/16/2021 percutaneous biliary drain placement 3.  Hepatic steatosis 4.  GERD 5.  Hypertension 6.  Normocytic anemia 7.  Enlarged prostate seen on CT 8.  Tobacco use 9.  Enterococcus and Aerococcus bacteremia 08/20/2021-discharged 08/26/2021 to complete outpatient course of Augmentin 10.  Peripheral neuropathy 11.  Port-A-Cath placement 09/23/2021 12.  Biliary drain exchange 09/23/2021; drain capped 10/12/2021 13.  Rigors following biliary drain procedure 09/23/2021, biliary drain culture-Enterobacter Cloacae, Bactrim x7 days 09/27/2021 14.  Right leg edema and pain 10/18/2021-Doppler negative for DVT 15.  Admission 03/18/2022 with a right upper quadrant abscess, status  post drain placement, culture positive for Klebsiella CTs 03/21/2022-no change in moderate volume ascites, interval decrease in subhepatic air-filled collection with catheter in appropriate position, resolved right pneumothorax, no change in bilateral pleural effusions-moderate on right and small on left CTs 04/25/2022 at [facility]-3.1 x 2.0 cm fluid collection in the abdomen and pelvis, decreased in volume and increased in organization; resolution of subhepatic collection; increased partially visualized large right pleural effusion with near complete collapse of the right lower lobe. Drainage catheter removed 04/25/2022 16.  Large right pleural effusion on CT 04/25/2022-thoracentesis 04/29/2022, 1.75 L of pleural fluid removed; culture negative, cytology with reactive mesothelial cells present, acute inflammation    Disposition: Jeffrey Blevins appears stable.  He completed a cycle of Durvalumab 11/16/2022.  He presents today with recent mild orthopnea and dyspnea on exertion.  Etiology unclear.  Chest x-ray is unremarkable.  No significant physical examination findings.  Plan to continue to monitor for now.  He will contact the office if symptoms worsen.  Next scheduled follow-up appointment is 12/14/2022.  We are available to see him sooner if needed.  Patient seen with Dr. Truett Perna.    Lonna Cobb ANP/GNP-BC   11/30/2022  2:27 PM This was a shared visit with Lonna Cobb.  Jeffrey Blevins was interviewed and examined.  He presents for an unscheduled visit due to dyspnea.  Physical examination and a chest x-ray are unremarkable today.  He will call for increased dyspnea.  I was present for greater than 50% of today's visit.  I performed medical decision making.  Mancel Bale, MD

## 2022-11-30 NOTE — Telephone Encounter (Signed)
The patient called to report shortness of breath, noting that it resembles the previous episode associated with fluid accumulation in the lungs. The patient also mentioned feelings of fatigue but indicated no other symptoms.

## 2022-12-02 ENCOUNTER — Encounter: Payer: Self-pay | Admitting: Oncology

## 2022-12-06 ENCOUNTER — Encounter: Payer: Self-pay | Admitting: Oncology

## 2022-12-08 ENCOUNTER — Encounter: Payer: Self-pay | Admitting: Dermatology

## 2022-12-08 ENCOUNTER — Other Ambulatory Visit: Payer: Self-pay | Admitting: Oncology

## 2022-12-08 ENCOUNTER — Ambulatory Visit (INDEPENDENT_AMBULATORY_CARE_PROVIDER_SITE_OTHER): Payer: Managed Care, Other (non HMO) | Admitting: Dermatology

## 2022-12-08 VITALS — BP 141/85 | HR 71 | Temp 98.1°F

## 2022-12-08 DIAGNOSIS — W908XXA Exposure to other nonionizing radiation, initial encounter: Secondary | ICD-10-CM

## 2022-12-08 DIAGNOSIS — L814 Other melanin hyperpigmentation: Secondary | ICD-10-CM

## 2022-12-08 DIAGNOSIS — L579 Skin changes due to chronic exposure to nonionizing radiation, unspecified: Secondary | ICD-10-CM

## 2022-12-08 DIAGNOSIS — L570 Actinic keratosis: Secondary | ICD-10-CM

## 2022-12-08 DIAGNOSIS — C44319 Basal cell carcinoma of skin of other parts of face: Secondary | ICD-10-CM

## 2022-12-08 DIAGNOSIS — G8918 Other acute postprocedural pain: Secondary | ICD-10-CM

## 2022-12-08 MED ORDER — OXYCODONE HCL 5 MG PO CAPS
5.0000 mg | ORAL_CAPSULE | Freq: Four times a day (QID) | ORAL | 0 refills | Status: DC | PRN
Start: 2022-12-08 — End: 2023-01-02

## 2022-12-08 MED ORDER — OXYCODONE HCL 5 MG PO TABS: 5.0000 mg | ORAL_TABLET | Freq: Four times a day (QID) | ORAL | 0 refills | Status: DC | PRN

## 2022-12-08 NOTE — Patient Instructions (Signed)

## 2022-12-08 NOTE — Progress Notes (Signed)
Follow-Up Visit   Subjective  Jeffrey Blevins is a 57 y.o. 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  He is accompanied by his wife.  The following portions of the chart were reviewed this encounter and updated as appropriate: medications, allergies, medical history  Review of Systems:  No other skin or systemic complaints except as noted in HPI or Assessment and Plan.  Objective  Well appearing patient in no apparent distress; mood and affect are within normal limits.  A focused examination was performed of the following areas: Right preauricular Relevant physical exam findings are noted in the Assessment and Plan.   Right Preauricular Area Pink pearly papule or plaque with arborizing vessels.             Assessment & Plan   Basal cell carcinoma (BCC) of skin of other part of face Right Preauricular Area  Mohs surgery  Consent obtained: written  Anticoagulation: Is the patient taking prescription anticoagulant and/or aspirin prescribed/recommended by a physician? Yes   Was the anticoagulation regimen changed prior to Mohs? No    Procedure Details: Surgery side: right Surgical site (from skin exam): Right Preauricular Area Pre-operative length (cm): 1.6 Pre-operative width (cm): 1.2  Micrographic Surgery Details: Post-operative length (cm): 1.8 Post-operative width (cm): 1.3 Number of Mohs stages: 1  Related Medications oxycodone (OXY-IR) 5 MG capsule Take 1 capsule (5 mg total) by mouth every 6 (six) hours as needed for up to 10 doses.  Postoperative pain  Related Medications oxycodone (OXY-IR) 5 MG capsule Take 1 capsule (5 mg total) by mouth every 6 (six) hours as needed for up to 10 doses.   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  Owens Shark, CMA, am acting as scribe for Gwenith Daily, MD.    12/08/2022  HISTORY OF PRESENT ILLNESS  Jeffrey Blevins is seen in consultation at the request of Dr. Caralyn Guile for biopsy-proven nodular BCC on the right pre-auricular cheek. They note that the area has been present for about 1 year increasing in size with bleeding.  There is no history of previous treatment.  Reports no other new or changing lesions and has no other complaints today.   He has been using efudex BID to face and nose for 3+ weeks and has had a robust response.   Medications and allergies: see patient chart.  Review of systems: Reviewed 8 systems and notable for the above skin cancer.  All other systems reviewed are unremarkable/negative, unless noted in the HPI. Past medical history, surgical history, family history, social history were also reviewed and are noted in the chart/questionnaire.    PHYSICAL EXAMINATION  General: Well-appearing, in no acute distress, alert and oriented x 4. Vitals reviewed in chart (if available).   Skin: Exam reveals a 1.6 x 1.2 cm erythematous papule and biopsy scar on the right pre-auricular cheek. There are rhytids, telangiectasias, and lentigines, consistent with photodamage.  Biopsy report(s) reviewed, confirming the diagnosis.  ASSESSMENT  1) Nodular Basal Cell Carcinoma- Right pre-auricular cheek 2) photodamage 3) solar lentigines  PLAN   1. Due to location, size, histology, or recurrence and the likelihood of subclinical extension as well as the need to conserve normal surrounding tissue, the patient was deemed acceptable for Mohs micrographic surgery (MMS).  The nature and purpose of the procedure, associated benefits and risks including recurrence and scarring,  possible complications such as pain, infection, and bleeding, and alternative methods of treatment if appropriate were discussed with the patient during consent. The lesion location was verified by the patient, by reviewing previous notes, pathology reports, and by photographs as well as angulation measurements if available.  Informed consent was reviewed and signed by the patient, and timeout was performed at 9:30 AM. See op note below.  2. For the photodamage and solar lentigines, sun protection discussed/information given on OTC sunscreens, and we recommend continued regular follow-up with primary dermatologist every 6 months or sooner for any growing, bleeding, or changing lesions. 3. Prognosis and future surveillance discussed. 4. Letter with treatment outcome sent to referring provider. 5. Pain- ibuprofen/oxycodone 5 mg 6. AKs- face, stop efudex, will re-evaluate in 1 month   MOHS MICROGRAPHIC SURGERY AND RECONSTRUCTION  Initial size:   1.6 x 1.2 cm Surgical defect/wound size: 1.8 x 1.3 cm Anesthesia:    0.33% lidocaine with 1:200,000 epinephrine EBL:    <5 mL Complications:  None Repair type:   Complex SQ suture:   5-0 Monocryl Cutaneous suture:  5-0 Fast absorbing gut Final size of the repair: 6.0 cm  Stages: 1  STAGE I: Anesthesia achieved with 0.5% lidocaine with 1:200,000 epinephrine. ChloraPrep applied. 1 section(s) excised using Mohs technique (this includes total peripheral and deep tissue margin excision and evaluation with frozen sections, excised and interpreted by the same physician). The tumor was first debulked and then excised with an approx. 2mm margin.  Hemostasis was achieved with electrocautery as needed.  The specimen was then oriented, subdivided/relaxed, inked, and processed using Mohs technique.    Frozen section analysis revealed a clear margin.  Reconstruction  The surgical wound was then cleaned, prepped, and re-anesthetized as above. Wound edges were  undermined extensively along at least one entire edge and at a distance equal to or greater than the width of the defect (see wound defect size above) in order to achieve closure and decrease wound tension and anatomic distortion. Redundant tissue repair including standing cone removal was performed. Hemostasis was achieved with electrocautery. Subcutaneous and epidermal tissues were approximated with the above sutures. The surgical site was then lightly scrubbed with sterile, saline-soaked gauze. Steri-strips were applied, and the area was then bandaged using Vaseline ointment, non-adherent gauze, gauze pads, and tape to provide an adequate pressure dressing. The patient tolerated the procedure well, was given detailed written and verbal wound care instructions, and was discharged in good condition.   The patient will follow-up: 1 month.   Documentation: I have reviewed the above documentation for accuracy and completeness, and I agree with the above.  Gwenith Daily, MD

## 2022-12-08 NOTE — Addendum Note (Signed)
Addended by: Gwenith Daily on: 12/08/2022 02:16 PM   Modules accepted: Orders

## 2022-12-14 ENCOUNTER — Inpatient Hospital Stay: Payer: Managed Care, Other (non HMO)

## 2022-12-14 ENCOUNTER — Inpatient Hospital Stay: Payer: Managed Care, Other (non HMO) | Admitting: Oncology

## 2022-12-15 ENCOUNTER — Inpatient Hospital Stay: Payer: Managed Care, Other (non HMO)

## 2022-12-15 ENCOUNTER — Inpatient Hospital Stay: Payer: Managed Care, Other (non HMO) | Admitting: Nurse Practitioner

## 2022-12-15 ENCOUNTER — Encounter: Payer: Self-pay | Admitting: Nurse Practitioner

## 2022-12-15 VITALS — BP 142/89 | HR 60 | Temp 98.1°F | Resp 18 | Ht 75.0 in | Wt 218.0 lb

## 2022-12-15 DIAGNOSIS — D649 Anemia, unspecified: Secondary | ICD-10-CM | POA: Diagnosis not present

## 2022-12-15 DIAGNOSIS — C221 Intrahepatic bile duct carcinoma: Secondary | ICD-10-CM | POA: Diagnosis present

## 2022-12-15 DIAGNOSIS — Z5112 Encounter for antineoplastic immunotherapy: Secondary | ICD-10-CM | POA: Diagnosis present

## 2022-12-15 DIAGNOSIS — Z79620 Long term (current) use of immunosuppressive biologic: Secondary | ICD-10-CM | POA: Diagnosis not present

## 2022-12-15 LAB — CMP (CANCER CENTER ONLY)
ALT: 20 U/L (ref 0–44)
AST: 25 U/L (ref 15–41)
Albumin: 4.4 g/dL (ref 3.5–5.0)
Alkaline Phosphatase: 99 U/L (ref 38–126)
Anion gap: 8 (ref 5–15)
BUN: 14 mg/dL (ref 6–20)
CO2: 27 mmol/L (ref 22–32)
Calcium: 9.6 mg/dL (ref 8.9–10.3)
Chloride: 102 mmol/L (ref 98–111)
Creatinine: 1.14 mg/dL (ref 0.61–1.24)
GFR, Estimated: >60 mL/min (ref >60)
Glucose, Bld: 106 mg/dL — ABNORMAL HIGH (ref 70–99)
Potassium: 4.1 mmol/L (ref 3.5–5.1)
Sodium: 137 mmol/L (ref 135–145)
Total Bilirubin: 0.5 mg/dL (ref 0.3–1.2)
Total Protein: 7.6 g/dL (ref 6.5–8.1)

## 2022-12-15 LAB — CBC WITH DIFFERENTIAL (CANCER CENTER ONLY)
Abs Immature Granulocytes: 0.04 10*3/uL (ref 0.00–0.07)
Basophils Absolute: 0 10*3/uL (ref 0.0–0.1)
Basophils Relative: 1 %
Eosinophils Absolute: 0.4 10*3/uL (ref 0.0–0.5)
Eosinophils Relative: 5 %
HCT: 41.8 % (ref 39.0–52.0)
Hemoglobin: 14.3 g/dL (ref 13.0–17.0)
Immature Granulocytes: 1 %
Lymphocytes Relative: 37 %
Lymphs Abs: 2.9 10*3/uL (ref 0.7–4.0)
MCH: 29.1 pg (ref 26.0–34.0)
MCHC: 34.2 g/dL (ref 30.0–36.0)
MCV: 85 fL (ref 80.0–100.0)
Monocytes Absolute: 0.4 10*3/uL (ref 0.1–1.0)
Monocytes Relative: 6 %
Neutro Abs: 4 10*3/uL (ref 1.7–7.7)
Neutrophils Relative %: 50 %
Platelet Count: 200 10*3/uL (ref 150–400)
RBC: 4.92 MIL/uL (ref 4.22–5.81)
RDW: 13.3 % (ref 11.5–15.5)
WBC Count: 7.7 10*3/uL (ref 4.0–10.5)
nRBC: 0 % (ref 0.0–0.2)

## 2022-12-15 MED ORDER — HEPARIN SOD (PORK) LOCK FLUSH 100 UNIT/ML IV SOLN
500.0000 [IU] | Freq: Once | INTRAVENOUS | Status: AC | PRN
  Administered 2022-12-15: 500 [IU]

## 2022-12-15 MED ORDER — SODIUM CHLORIDE 0.9 % IV SOLN: Freq: Once | INTRAVENOUS | Status: AC

## 2022-12-15 MED ORDER — SODIUM CHLORIDE 0.9% FLUSH
10.0000 mL | INTRAVENOUS | Status: DC | PRN
  Administered 2022-12-15: 10 mL

## 2022-12-15 MED ORDER — SODIUM CHLORIDE 0.9 % IV SOLN
1500.0000 mg | Freq: Once | INTRAVENOUS | Status: AC
  Administered 2022-12-15: 1500 mg via INTRAVENOUS
  Filled 2022-12-15: qty 30

## 2022-12-15 MED ORDER — AMLODIPINE BESYLATE 5 MG PO TABS
5.0000 mg | ORAL_TABLET | Freq: Every day | ORAL | 1 refills | Status: DC
Start: 2022-12-15 — End: 2023-06-29

## 2022-12-15 NOTE — Progress Notes (Signed)
Patient seen by Lonna Cobb NP today  Vitals are within treatment parameters:Yes   Labs are within treatment parameters: Yes   Treatment plan has been signed: Yes   Per physician team, Patient is ready for treatment and there are NO modifications to the treatment plan.

## 2022-12-15 NOTE — Progress Notes (Addendum)
[facility] OFFICE PROGRESS NOTE   Diagnosis: Cholangiocarcinoma  INTERVAL HISTORY:   Mr. Delfin returns as scheduled.  He completed another cycle of Durvalumab 11/16/2022.  He is no longer having shortness of breath.  He has recently noticed sinus congestion, watery eyes.  He has a good appetite.  No nausea or vomiting.  No rash or diarrhea.  Sometimes in the morning hours he notes pain in his hands.  The pain improves as the day progresses.  Objective:  Vital signs in last 24 hours:  Blood pressure (!) 142/89, pulse 60, temperature 98.1 F (36.7 C), temperature source Temporal, resp. rate 18, height 6\' 3"  (1.905 m), weight 218 lb (98.9 kg), SpO2 100%.    HEENT: No thrush or ulcers. Resp: Lungs clear bilaterally. Cardio: Regular rate and rhythm. GI: No hepatosplenomegaly. Vascular: No leg edema. Skin: Scattered areas of erythema on the face related to recent dermatology treatment.  Surgical incision right lateral face appears healed (Mohs surgery).   Lab Results:  Lab Results  Component Value Date   WBC 7.7 12/15/2022   HGB 14.3 12/15/2022   HCT 41.8 12/15/2022   MCV 85.0 12/15/2022   PLT 200 12/15/2022   NEUTROABS 4.0 12/15/2022    Imaging:  No results found.  Medications: I have reviewed the patient's current medications.  Assessment/Plan: Liver mass concerning for malignancy -CT abdomen/pelvis with contrast 08/14/1998 23-2.9 x 2.7 x 2.7 cm hypoenhancing mass in segment 5 near the liver hilum just above hepatic ductal confluence suspicious for primary bile duct malignancy or primary hepatic malignancy -MRCP 08/14/2021-irregular mass of segment 8 of the liver with perilesional enhancement concerning for cholangiocarcinoma -08/14/2021 CA 19.9 was elevated at 76 -08/16/2021 AFP 6.4 -08/16/2021 cytology from bile duct brushing showed atypical cells -08/19/2021 cytology from bile duct brushing showed cells suspicious for malignancy -08/25/2021 CT-guided liver  biopsy-moderately differentiated adenocarcinoma, CK7 positive, CDX2 positive in a small population of cells, TTF-1 negative, CK20 negative-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 -[facility] at [facility] 09/06/2021-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, occlusion of the anterior branch of the right portal vein, periportal enhancement and thickening on delayed imaging potentially due to tumor infiltration, no change in the 2 x 3.2 cm mass in segment 8, mild to moderate left and right intrahepatic biliary dilatation, biliary stent in place, small portacaval and periportal nodes, no evidence of metastatic disease in the chest -Cycle 1 gemcitabine/cisplatin/durvalumab 10/01/2021 -Cycle 2 gemcitabine/cisplatin/durvalumab 10/22/2021 -Cycle 3 gemcitabine/cisplatin/durvalumab 11/11/2021, D1 cisplatin held due to neuropathy, cisplatin resumed day 8 -Cycle 4 gemcitabine/cisplatin/durvalumab 12/03/2021 -MRI abdomen 12/15/2021-segment 8 cholangiocarcinoma no longer measurable, residual biliary duct dilation in the anterior right and left hepatic lobes, no evidence of metastatic disease persistent anterior right and new peripheral left portal vein occlusion -Cycle 5 gemcitabine/cisplatin/durvalumab 12/24/2021 -Cycle 6 gemcitabine/cisplatin/durvalumab 01/13/2022 -CTs at [facility] 02/14/2022-fine hypoattenuating masses in the left and right hepatic lobes, more prominent compared to the 12/15/2021 MRI, potentially representing multifocal cholangiocarcinoma, persistent occlusion of left portal and anterior branch of the right portal vein, no extrahepatic lymphadenopathy or metastatic disease -MRI liver 12/19 /2023-1.3 x 0.8 cm hypoenhancing lesion in segment 8, ill-defined area of delayed enhancement in the caudate measuring 1.7 cm, no left liver lesion, 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  Partial hepatectomy anatomy.  Marked improvement postsurgical fluid collections.  Biliary stent in place.  Mild biliary duct dilatation in the central hepatic lobes.  No evidence of metastatic disease in the abdomen/pelvis. -07/27/2022 continue every 4-week Durvalumab -CTs 10/18/2022-partial left hepatectomy, common hepatic and bile duct stent unchanged with unchanged segmental intrahepatic biliary duct dilation in the remnant left lobe and anterior right lobe.  No evidence of metastatic disease in the chest, abdomen, or pelvis.  Unchanged 0.5 cm subpleural nodule in the right middle lobe -10/19/2022 continue every 4-week Durvalumab 2.  Obstructive jaundice -08/16/2021 percutaneous biliary drain placement 3.  Hepatic steatosis 4.  GERD 5.  Hypertension 6.  Normocytic anemia 7.  Enlarged prostate seen on CT 8.  Tobacco use 9.  Enterococcus and Aerococcus bacteremia 08/20/2021-discharged 08/26/2021 to complete outpatient course of Augmentin 10.  Peripheral neuropathy 11.  Port-A-Cath placement 09/23/2021 12.  Biliary drain exchange 09/23/2021; drain capped 10/12/2021 13.  Rigors following biliary drain procedure 09/23/2021, biliary drain culture-Enterobacter Cloacae, Bactrim x7 days 09/27/2021 14.  Right leg edema and pain 10/18/2021-Doppler negative for DVT 15.  Admission 03/18/2022 with a right upper quadrant abscess, status post drain placement, culture positive for Klebsiella CTs 03/21/2022-no change in moderate volume ascites, interval decrease in subhepatic air-filled  collection with catheter in appropriate position, resolved right pneumothorax, no change in bilateral pleural effusions-moderate on right and small on left CTs 04/25/2022 at [facility]-3.1 x 2.0 cm fluid collection in the abdomen and pelvis, decreased in volume and increased in organization; resolution of subhepatic collection; increased partially visualized large right pleural effusion with near complete collapse of the right lower lobe. Drainage catheter removed 04/25/2022 16.  Large right pleural effusion on CT 04/25/2022-thoracentesis 04/29/2022, 1.75 L of pleural fluid removed; culture negative, cytology with reactive mesothelial cells present, acute inflammation  Disposition: Mr. Socarras appears stable.  He continues Durvalumab every 4 weeks.  No clinical evidence of disease progression.  Plan to continue the same, treatment today.  CBC and chemistry panel reviewed.  Labs adequate to proceed as above.  Blood pressure has been mildly elevated the past few months.  He will resume Norvasc 5 mg daily.  He has taken this in the past and tolerated well.  He will return for follow-up and treatment in 4 weeks.  We are available to see him sooner if needed.    Lonna Cobb ANP/GNP-BC   12/15/2022  2:27 PM

## 2022-12-15 NOTE — Patient Instructions (Signed)

## 2022-12-15 NOTE — Addendum Note (Signed)
Addended by: Rana Snare on: 12/15/2022 02:58 PM   Modules accepted: Orders

## 2022-12-15 NOTE — Patient Instructions (Signed)
[facility] [place] [facility]   Discharge Instructions: Thank you for choosing [facility] to provide your oncology and hematology care.   If you have a lab appointment with the Cancer Center, please go directly to the Cancer Center and check in at the registration area.   Wear comfortable clothing and clothing appropriate for easy access to any Portacath or PICC line.   We strive to give you quality time with your provider. You may need to reschedule your appointment if you arrive late (15 or more minutes).  Arriving late affects you and other patients whose appointments are after yours.  Also, if you miss three or more appointments without notifying the office, you may be dismissed from the clinic at the provider's discretion.      For prescription refill requests, have your pharmacy contact our office and allow 72 hours for refills to be completed.    Today you received the following chemotherapy and/or immunotherapy agents Durvalumab (IMFINZI).      To help prevent nausea and vomiting after your treatment, we encourage you to take your nausea medication as directed.  BELOW ARE SYMPTOMS THAT SHOULD BE REPORTED IMMEDIATELY: *FEVER GREATER THAN 100.4 F (38 C) OR HIGHER *CHILLS OR SWEATING *NAUSEA AND VOMITING THAT IS NOT CONTROLLED WITH YOUR NAUSEA MEDICATION *UNUSUAL SHORTNESS OF BREATH *UNUSUAL BRUISING OR BLEEDING *URINARY PROBLEMS (pain or burning when urinating, or frequent urination) *BOWEL PROBLEMS (unusual diarrhea, constipation, pain near the anus) TENDERNESS IN MOUTH AND THROAT WITH OR WITHOUT PRESENCE OF ULCERS (sore throat, sores in mouth, or a toothache) UNUSUAL RASH, SWELLING OR PAIN  UNUSUAL VAGINAL DISCHARGE OR ITCHING   Items with * indicate a potential emergency and should be followed up as soon as possible or go to the Emergency Department if any problems should occur.  Please show the CHEMOTHERAPY ALERT CARD or IMMUNOTHERAPY ALERT  CARD at check-in to the Emergency Department and triage nurse.  Should you have questions after your visit or need to cancel or reschedule your appointment, please contact [facility]  Dept: [phone]  and follow the prompts.  Office hours are 8:00 a.m. to 4:30 p.m. Monday - Friday. Please note that voicemails left after 4:00 p.m. may not be returned until the following business day.  We are closed weekends and major holidays. You have access to a nurse at all times for urgent questions. Please call the main number to the clinic Dept: [phone] and follow the prompts.   For any non-urgent questions, you may also contact your provider using MyChart. We now offer e-Visits for anyone 53 and older to request care online for non-urgent symptoms. For details visit [email or web address].   Also download the MyChart app! Go to the app store, search "MyChart", open the app, select [place], and log in with your MyChart username and password.  Durvalumab Injection What is this medication? DURVALUMAB (dur VAL ue mab) treats some types of cancer. It works by helping your immune system slow or stop the spread of cancer cells. It is a monoclonal antibody. This medicine may be used for other purposes; ask your health care provider or pharmacist if you have questions. COMMON BRAND NAME(S): IMFINZI What should I tell my care team before I take this medication? They need to know if you have any of these conditions: Allogeneic stem cell transplant (uses someone else's stem cells) Autoimmune diseases, such as Crohn disease, ulcerative colitis, lupus History of chest radiation Nervous system  problems, such as Guillain-Barre syndrome, myasthenia gravis Organ transplant An unusual or allergic reaction to durvalumab, other medications, foods, dyes, or preservatives Pregnant or trying to get pregnant Breast-feeding How should I use this medication? This medication is  infused into a vein. It is given by your care team in a hospital or clinic setting. A special MedGuide will be given to you before each treatment. Be sure to read this information carefully each time. Talk to your care team about the use of this medication in children. Special care may be needed. Overdosage: If you think you have taken too much of this medicine contact a poison control center or emergency room at once. NOTE: This medicine is only for you. Do not share this medicine with others. What if I miss a dose? Keep appointments for follow-up doses. It is important not to miss your dose. Call your care team if you are unable to keep an appointment. What may interact with this medication? Interactions have not been studied. This list may not describe all possible interactions. Give your health care provider a list of all the medicines, herbs, non-prescription drugs, or dietary supplements you use. Also tell them if you smoke, drink alcohol, or use illegal drugs. Some items may interact with your medicine. What should I watch for while using this medication? Your condition will be monitored carefully while you are receiving this medication. You may need blood work while taking this medication. This medication may cause serious skin reactions. They can happen weeks to months after starting the medication. Contact your care team right away if you notice fevers or flu-like symptoms with a rash. The rash may be red or purple and then turn into blisters or peeling of the skin. You may also notice a red rash with swelling of the face, lips, or lymph nodes in your neck or under your arms. Tell your care team right away if you have any change in your eyesight. Talk to your care team if you may be pregnant. Serious birth defects can occur if you take this medication during pregnancy and for 3 months after the last dose. You will need a negative pregnancy test before starting this medication. Contraception  is recommended while taking this medication and for 3 months after the last dose. Your care team can help you find the option that works for you. Do not breastfeed while taking this medication and for 3 months after the last dose. What side effects may I notice from receiving this medication? Side effects that you should report to your care team as soon as possible: Allergic reactions--skin rash, itching, hives, swelling of the face, lips, tongue, or throat Dry cough, shortness of breath or trouble breathing Eye pain, redness, irritation, or discharge with blurry or decreased vision Heart muscle inflammation--unusual weakness or fatigue, shortness of breath, chest pain, fast or irregular heartbeat, dizziness, swelling of the ankles, feet, or hands Hormone gland problems--headache, sensitivity to light, unusual weakness or fatigue, dizziness, fast or irregular heartbeat, increased sensitivity to cold or heat, excessive sweating, constipation, hair loss, increased thirst or amount of urine, tremors or shaking, irritability Infusion reactions--chest pain, shortness of breath or trouble breathing, feeling faint or lightheaded Kidney injury (glomerulonephritis)--decrease in the amount of urine, red or dark brown urine, foamy or bubbly urine, swelling of the ankles, hands, or feet Liver injury--right upper belly pain, loss of appetite, nausea, light-colored stool, dark yellow or brown urine, yellowing skin or eyes, unusual weakness or fatigue Pain, tingling, or numbness  in the hands or feet, muscle weakness, change in vision, confusion or trouble speaking, loss of balance or coordination, trouble walking, seizures Rash, fever, and swollen lymph nodes Redness, blistering, peeling, or loosening of the skin, including inside the mouth Sudden or severe stomach pain, bloody diarrhea, fever, nausea, vomiting Side effects that usually do not require medical attention (report these to your care team if they  continue or are bothersome): Bone, joint, or muscle pain Diarrhea Fatigue Loss of appetite Nausea Skin rash This list may not describe all possible side effects. Call your doctor for medical advice about side effects. You may report side effects to FDA at 1-800-FDA-1088. Where should I keep my medication? This medication is given in a hospital or clinic. It will not be stored at home. NOTE: This sheet is a summary. It may not cover all possible information. If you have questions about this medicine, talk to your doctor, pharmacist, or health care provider.  2024 Elsevier/Gold Standard (2021-06-29 00:00:00)

## 2022-12-20 ENCOUNTER — Other Ambulatory Visit: Payer: Self-pay

## 2022-12-21 ENCOUNTER — Telehealth: Payer: Self-pay

## 2022-12-21 NOTE — Telephone Encounter (Signed)
-----   Message from Lonna Cobb sent at 12/21/2022  9:17 AM EDT ----- Please call him-the MRI of the brain was negative for evidence of cancer or acute process.  Follow-up as scheduled.

## 2022-12-21 NOTE — Telephone Encounter (Signed)
Patient gave verbal understanding and had no further questions or concerns  

## 2022-12-27 ENCOUNTER — Other Ambulatory Visit: Payer: Self-pay | Admitting: Oncology

## 2022-12-27 ENCOUNTER — Ambulatory Visit (HOSPITAL_BASED_OUTPATIENT_CLINIC_OR_DEPARTMENT_OTHER): Payer: Managed Care, Other (non HMO) | Admitting: Family Medicine

## 2023-01-02 ENCOUNTER — Ambulatory Visit (INDEPENDENT_AMBULATORY_CARE_PROVIDER_SITE_OTHER): Payer: Managed Care, Other (non HMO) | Admitting: Family Medicine

## 2023-01-02 ENCOUNTER — Encounter (HOSPITAL_BASED_OUTPATIENT_CLINIC_OR_DEPARTMENT_OTHER): Payer: Self-pay | Admitting: *Deleted

## 2023-01-02 ENCOUNTER — Encounter (HOSPITAL_BASED_OUTPATIENT_CLINIC_OR_DEPARTMENT_OTHER): Payer: Self-pay | Admitting: Family Medicine

## 2023-01-02 VITALS — BP 136/89 | HR 62 | Ht 74.0 in | Wt 221.4 lb

## 2023-01-02 DIAGNOSIS — R7989 Other specified abnormal findings of blood chemistry: Secondary | ICD-10-CM

## 2023-01-02 DIAGNOSIS — R350 Frequency of micturition: Secondary | ICD-10-CM

## 2023-01-02 DIAGNOSIS — R7301 Impaired fasting glucose: Secondary | ICD-10-CM | POA: Diagnosis not present

## 2023-01-02 DIAGNOSIS — I10 Essential (primary) hypertension: Secondary | ICD-10-CM

## 2023-01-02 DIAGNOSIS — R972 Elevated prostate specific antigen [PSA]: Secondary | ICD-10-CM | POA: Diagnosis not present

## 2023-01-02 NOTE — Patient Instructions (Signed)
Please obtain your labs on 01/11/23.

## 2023-01-02 NOTE — Progress Notes (Unsigned)
New Patient Office Visit  Subjective:   Jeffrey Blevins 1965-12-11 01/02/2023  Chief Complaint  Patient presents with   New Patient (Initial Visit)    Patient is here today to get established with the practice. Has been having some problems with his BP.    HPI: Jeffrey Blevins presents today to establish care at [facility] at [facility]. Introduced to Publishing rights manager role and practice setting.  All questions answered.   Last PCP: Jeffrey Blevins, PCP [facility]  Last annual physical: Over 1 year ago  Concerns: See below    ONCOLOGY CARE:  Patient is managed by Dr. Myrle Sheng for primary malignant neoplasm of liver and cholangiocarcinoma.  He is undergoing immunotherapy at this time and is compliant with regimen.  Labs and previous notes reviewed by PCP. Per chart review patient has had elevated TSH levels without known hypothyroid disease or symptoms.    HYPERTENSION: DIAMOND MARTUCCI presents for the medical management of hypertension.  Patient's current hypertension medication regimen is: Amlodipine 5mg  daily Patient is  currently taking prescribed medications for HTN.  Patient is not regularly keeping a check on BP at home.  Adhering to low sodium diet: Yes Exercising Regularly: Yes Denies headache, dizziness, CP, SHOB, vision changes.   BP Readings from Last 3 Encounters:  01/02/23 136/89  12/15/22 (!) 142/89  12/08/22 (!) 141/85    BPH: Onset:  gradual Duration: years  Nocturia: 2-3x per night Urinary frequency:yes Incomplete voiding: no Urgency: yes Weak urinary stream: no Straining to start stream: no Dysuria: no  BPH status: uncontrolled  IPSS Questionnaire (AUA-7): Over the past month.   1)  How often have you had a sensation of not emptying your bladder completely after you finish urinating?  1 - Less than 1 time in 5  2)  How often have you had to urinate again less than two hours after you finished urinating?  3 - About half the time  3)  How often have you found you stopped and started again several times when you urinated?  0 - Not at all  4) How difficult have you found it to postpone urination?  3 - About half the time  5) How often have you had a weak urinary stream?  0 - Not at all  6) How often have you had to push or strain to begin urination?  0 - Not at all  7) How many times did you most typically get up to urinate from the time you went to bed until the time you got up in the morning?  3 - 3 times  Total score:  0-7 mildly symptomatic   8-19 moderately symptomatic   20-35 severely symptomatic     The following portions of the patient's history were reviewed and updated as appropriate: past medical history, past surgical history, family history, social history, allergies, medications, and problem list.   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  Followed by Dr. Truett Perna, Dr. Modesta Messing at [facility]   GERD (gastroesophageal reflux disease)    Hiatal hernia    History of Barrett's esophagus    per pt dx yrs ago, but with last egd none noted 05/ 2021    History of esophageal dilatation    per pt hx several times last one approx. 2009 then 05/ 2021  for stricture   Hypertension    followed by pcp   Raynauds phenomenon    Right hydrocele    Past Surgical History:  Procedure Laterality Date   COLONOSCOPY WITH ESOPHAGOGASTRODUODENOSCOPY (EGD)  07/16/2019   last one   INGUINAL HERNIA REPAIR Bilateral    last one 1990s   IR CHOLANGIOGRAM EXISTING TUBE  10/12/2021   IR ENDOLUMINAL BX OF BILIARY TREE  08/19/2021   IR EXCHANGE BILIARY DRAIN  08/19/2021   IR EXCHANGE BILIARY DRAIN  09/23/2021   IR EXCHANGE BILIARY DRAIN  11/04/2021   IR IMAGING GUIDED PORT INSERTION  09/23/2021   IR INT EXT BILIARY DRAIN WITH CHOLANGIOGRAM  08/16/2021   SPERMATOCELECTOMY Right 10/09/2020   Procedure: SPERMATOCELECTOMY;  Surgeon: Jerilee Field, MD;  Location: [facility];  Service: Urology;  Laterality: Right;   Family History  Problem Relation Age of Onset   Breast cancer Mother    Stroke Father    Breast cancer Sister    Cardiomyopathy Brother        Cardiomegaly   Social History   Socioeconomic History   Marital status: Married    Spouse name: Not on file   Number of children: Not on file   Years of education: Not on file   Highest education level: Not on file  Occupational History   Not on file  Tobacco Use   Smoking status: Former    Types: Cigarettes    Passive exposure: Current   Smokeless tobacco: Never   Tobacco comments:    10-06-2020  per pt 1pp 7days  Vaping Use   Vaping status: Never Used  Substance and Sexual Activity   Alcohol use: Yes    Alcohol/week: 4.0 standard drinks of alcohol    Types: 4 Cans of beer per week   Drug use: Never   Sexual activity: Not on file  Other Topics Concern   Not on file  Social History Narrative   Not on file   Social Determinants of Health   Financial Resource Strain: Low Risk  (01/02/2023)   Overall Financial Resource Strain (CARDIA)    Difficulty of Paying Living Expenses:  Not very hard  Food Insecurity: No Food Insecurity (01/02/2023)   Hunger Vital Sign    Worried About Running Out of Food in the Last Year: Never true    Ran Out of Food in the Last Year: Never true  Transportation Needs: No Transportation Needs (03/09/2022)   [facility], [facility]   PRAPARE - Transportation    In the past 12 months, has lack of transportation kept you from medical appointments or from getting medications?: No    Lack of Transportation (Non-Medical): No  Physical Activity: Insufficiently Active (01/02/2023)   Exercise Vital Sign    Days of Exercise  per Week: 1 day    Minutes of Exercise per Session: 10 min  Stress: No Stress Concern Present (01/02/2023)   [employer] of Occupational Health - Occupational Stress Questionnaire    Feeling of Stress : Only a little  Social Connections: Moderately Integrated (01/02/2023)   Social Connection and Isolation Panel [NHANES]    Frequency of Communication with Friends and Family: Three times a week    Frequency of Social Gatherings with Friends and Family: Three times a week    Attends Religious Services: More than 4 times per year    Active Member of Clubs or Organizations: No    Attends Banker Meetings: Never    Marital Status: Married  Catering manager Violence: Not At Risk (01/02/2023)   Humiliation, Afraid, Rape, and Kick questionnaire    Fear of Current or Ex-Partner: No    Emotionally Abused: No    Physically Abused: No    Sexually Abused: No   Outpatient Medications Prior to Visit  Medication Sig Dispense Refill   amLODipine (NORVASC) 5 MG tablet Take 1 tablet (5 mg total) by mouth daily. 30 tablet 1   esomeprazole (NEXIUM) 20 MG capsule Take 1 capsule (20 mg total) by mouth every morning. 30 capsule 3   fluorouracil (EFUDEX) 5 % cream Apply topically 2 (two) times daily. 40 g 2   ibuprofen (ADVIL) 200 MG tablet Take 400 mg by mouth 3 (three) times  daily as needed for headache (pain).     lidocaine-prilocaine (EMLA) cream APPLY TOPICALLY AS NEEDED 30 g 3   apixaban (ELIQUIS) 5 MG TABS tablet TAKE 1 TABLET BY MOUTH TWICE A DAY (Patient not taking: Reported on 04/15/2022) 60 tablet 5   LORazepam (ATIVAN) 0.5 MG tablet TAKE 1 TABLET BY MOUTH AT BEDTIME AS NEEDED FOR SLEEP (Patient not taking: Reported on 06/01/2022) 30 tablet 0   ondansetron (ZOFRAN) 8 MG tablet Take 1 tablet (8 mg total) by mouth every 8 (eight) hours as needed for nausea or vomiting (Starting day 4 after chemo as needed). (Patient not taking: Reported on 06/01/2022) 30 tablet 1   oxyCODONE (OXY IR/ROXICODONE) 5 MG immediate release tablet Take 1 tablet (5 mg total) by mouth every 6 (six) hours as needed for up to 10 doses for severe pain. (Patient not taking: Reported on 12/15/2022) 10 tablet 0   prochlorperazine (COMPAZINE) 10 MG tablet Take 1 tablet (10 mg total) by mouth every 6 (six) hours as needed for nausea or vomiting. (Patient not taking: Reported on 06/01/2022) 60 tablet 1   traMADol (ULTRAM) 50 MG tablet TAKE 1/2 TO 1 TABLET BY MOUTH EVERY 6 HOURS AS NEEDED FOR PAIN (Patient not taking: Reported on 06/01/2022) 30 tablet 0   acetaminophen (TYLENOL) 500 MG tablet Take 1,000 mg by mouth 3 (three) times daily as needed for headache (pain). (Patient not taking: Reported on 04/26/2022)     oxyCODONE (OXY IR/ROXICODONE) 5 MG immediate release tablet Take 5 mg by mouth every 4 (four) hours as needed for severe pain. (Patient not taking: Reported on 06/01/2022)     oxycodone (OXY-IR) 5 MG capsule Take 1 capsule (5 mg total) by mouth every 6 (six) hours as needed for up to 10 doses. (Patient not taking: Reported on 12/15/2022) 10 capsule 0   Facility-Administered Medications Prior to Visit  Medication Dose Route Frequency Provider Last Rate Last Admin   magnesium sulfate 2 GM/50ML IVPB            palonosetron (ALOXI) 0.25  MG/5ML injection            Allergies  Allergen Reactions    Codeine Nausea And Vomiting and Rash    ROS: A complete ROS was performed with pertinent positives/negatives noted in the HPI. The remainder of the ROS are negative.   Objective:   Today's Vitals   01/02/23 1337  BP: 136/89  Pulse: 62  SpO2: 100%  Weight: 221 lb 6.4 oz (100.4 kg)  Height: 6\' 2"  (1.88 m)    GENERAL: Well-appearing, in NAD. Well nourished.  SKIN: Pink, warm and dry. No rash, lesion, ulceration, or ecchymoses.  Head: Normocephalic. NECK: Trachea midline. Full ROM w/o pain or tenderness.  RESPIRATORY: Chest wall symmetrical. Respirations even and non-labored. Breath sounds clear to auscultation bilaterally.  CARDIAC: S1, S2 present, regular rate and rhythm without murmur or gallops. Peripheral pulses 2+ bilaterally.  MSK: Muscle tone and strength appropriate for age.  EXTREMITIES: Without clubbing, cyanosis, or edema.  NEUROLOGIC: No motor or sensory deficits. Steady, even gait. C2-C12 intact.  PSYCH/MENTAL STATUS: Alert, oriented x 3. Cooperative, appropriate mood and affect.    Health Maintenance Due  Topic Date Due   Colonoscopy  Never done   COVID-19 Vaccine (2 - Janssen risk series) 08/14/2019    No results found for any visits on 01/02/23.     Assessment & Plan:  1. Elevated PSA 2. Urinary frequency Will rule out any PSA elevation and treat as appropriate per results for possible BPH.  Also rule out possible prediabetes or diabetes by obtaining a hemoglobin A1c. - PSA; Future - Hemoglobin A1c; Future  4. Elevated TSH Will obtain repeat TSH with T3 and T4 with upcoming lab results. - T4, free; Future - T3, free; Future - TSH; Future  5. Primary hypertension Stable, continue current regimen.  Will also obtain lipid panel to evaluate for any HLD. - Lipid panel; Future  Patient to reach out to office if new, worrisome, or unresolved symptoms arise or if no improvement in patient's condition. Patient verbalized understanding and is agreeable to  treatment plan. All questions answered to patient's satisfaction.    Return in about 3 months (around 04/04/2023) for ANNUAL PHYSICAL.    Yolanda Manges, FNP

## 2023-01-03 ENCOUNTER — Other Ambulatory Visit: Payer: Self-pay

## 2023-01-03 DIAGNOSIS — R7301 Impaired fasting glucose: Secondary | ICD-10-CM | POA: Insufficient documentation

## 2023-01-05 ENCOUNTER — Encounter: Payer: Self-pay | Admitting: Dermatology

## 2023-01-05 ENCOUNTER — Ambulatory Visit: Payer: Managed Care, Other (non HMO) | Admitting: Dermatology

## 2023-01-05 VITALS — BP 146/85

## 2023-01-05 DIAGNOSIS — Z85828 Personal history of other malignant neoplasm of skin: Secondary | ICD-10-CM

## 2023-01-05 DIAGNOSIS — W908XXA Exposure to other nonionizing radiation, initial encounter: Secondary | ICD-10-CM | POA: Diagnosis not present

## 2023-01-05 DIAGNOSIS — L570 Actinic keratosis: Secondary | ICD-10-CM

## 2023-01-05 DIAGNOSIS — C44319 Basal cell carcinoma of skin of other parts of face: Secondary | ICD-10-CM

## 2023-01-05 NOTE — Patient Instructions (Signed)

## 2023-01-05 NOTE — Progress Notes (Signed)
   Follow-Up Visit   Subjective  Jeffrey Blevins is a 57 y.o. male who presents for the following: History of BCC of right preauricular - Mohs 12/08/22. AK follow up of face treated with Efudex, he used it for 3 weeks with a substantial response but his face looks much improved.   The following portions of the chart were reviewed this encounter and updated as appropriate: medications, allergies, medical history  Review of Systems:  No other skin or systemic complaints except as noted in HPI or Assessment and Plan.  Objective  Well appearing patient in no apparent distress; mood and affect are within normal limits.   A focused examination was performed of the following areas: Face  Relevant exam findings are noted in the Assessment and Plan.        Assessment & Plan   Actinic Keratoses- s/p 3 weeks of efudex- Erythema improving - Reassured that lesions have healed well and reacted to the medicine - Discussed that redness will continue to improved - Recommended UV protection to face to prevent PIH  History of Basal Cell Carcinoma- right preauricular region - Wound healed well - No evidence of recurrence  Return in about 6 months (around 07/05/2023) for Follow up.   Documentation: I have reviewed the above documentation for accuracy and completeness, and I agree with the above.  Gwenith Daily, MD

## 2023-01-06 ENCOUNTER — Encounter: Payer: Self-pay | Admitting: Oncology

## 2023-01-06 ENCOUNTER — Other Ambulatory Visit: Payer: Self-pay

## 2023-01-07 ENCOUNTER — Other Ambulatory Visit: Payer: Self-pay | Admitting: Oncology

## 2023-01-10 ENCOUNTER — Ambulatory Visit: Payer: Managed Care, Other (non HMO)

## 2023-01-10 ENCOUNTER — Inpatient Hospital Stay: Payer: Managed Care, Other (non HMO)

## 2023-01-10 ENCOUNTER — Ambulatory Visit: Payer: Managed Care, Other (non HMO) | Admitting: Nurse Practitioner

## 2023-01-10 ENCOUNTER — Inpatient Hospital Stay: Payer: Managed Care, Other (non HMO) | Attending: Oncology

## 2023-01-10 ENCOUNTER — Other Ambulatory Visit (HOSPITAL_BASED_OUTPATIENT_CLINIC_OR_DEPARTMENT_OTHER): Payer: Self-pay | Admitting: Family Medicine

## 2023-01-10 ENCOUNTER — Other Ambulatory Visit: Payer: Managed Care, Other (non HMO)

## 2023-01-10 ENCOUNTER — Inpatient Hospital Stay: Payer: Managed Care, Other (non HMO) | Admitting: Nurse Practitioner

## 2023-01-10 ENCOUNTER — Encounter: Payer: Self-pay | Admitting: Nurse Practitioner

## 2023-01-10 VITALS — BP 118/80 | HR 63 | Temp 98.1°F | Resp 18 | Ht 74.0 in | Wt 219.0 lb

## 2023-01-10 DIAGNOSIS — C221 Intrahepatic bile duct carcinoma: Secondary | ICD-10-CM

## 2023-01-10 DIAGNOSIS — Z5112 Encounter for antineoplastic immunotherapy: Secondary | ICD-10-CM | POA: Diagnosis present

## 2023-01-10 DIAGNOSIS — Z79620 Long term (current) use of immunosuppressive biologic: Secondary | ICD-10-CM | POA: Diagnosis not present

## 2023-01-10 LAB — CBC WITH DIFFERENTIAL (CANCER CENTER ONLY)
Abs Immature Granulocytes: 0.02 10*3/uL (ref 0.00–0.07)
Basophils Absolute: 0 10*3/uL (ref 0.0–0.1)
Basophils Relative: 1 %
Eosinophils Absolute: 0.4 10*3/uL (ref 0.0–0.5)
Eosinophils Relative: 5 %
HCT: 40 % (ref 39.0–52.0)
Hemoglobin: 13.9 g/dL (ref 13.0–17.0)
Immature Granulocytes: 0 %
Lymphocytes Relative: 35 %
Lymphs Abs: 2.5 10*3/uL (ref 0.7–4.0)
MCH: 29.8 pg (ref 26.0–34.0)
MCHC: 34.8 g/dL (ref 30.0–36.0)
MCV: 85.8 fL (ref 80.0–100.0)
Monocytes Absolute: 0.4 10*3/uL (ref 0.1–1.0)
Monocytes Relative: 6 %
Neutro Abs: 3.7 10*3/uL (ref 1.7–7.7)
Neutrophils Relative %: 53 %
Platelet Count: 159 10*3/uL (ref 150–400)
RBC: 4.66 MIL/uL (ref 4.22–5.81)
RDW: 13.6 % (ref 11.5–15.5)
WBC Count: 7.1 10*3/uL (ref 4.0–10.5)
nRBC: 0 % (ref 0.0–0.2)

## 2023-01-10 LAB — CMP (CANCER CENTER ONLY)
ALT: 22 U/L (ref 0–44)
AST: 29 U/L (ref 15–41)
Albumin: 4.3 g/dL (ref 3.5–5.0)
Alkaline Phosphatase: 82 U/L (ref 38–126)
Anion gap: 6 (ref 5–15)
BUN: 13 mg/dL (ref 6–20)
CO2: 28 mmol/L (ref 22–32)
Calcium: 10.2 mg/dL (ref 8.9–10.3)
Chloride: 102 mmol/L (ref 98–111)
Creatinine: 1.1 mg/dL (ref 0.61–1.24)
GFR, Estimated: >60 mL/min (ref >60)
Glucose, Bld: 93 mg/dL (ref 70–99)
Potassium: 4.1 mmol/L (ref 3.5–5.1)
Sodium: 136 mmol/L (ref 135–145)
Total Bilirubin: 0.6 mg/dL (ref <1.2)
Total Protein: 7.3 g/dL (ref 6.5–8.1)

## 2023-01-10 LAB — TSH: TSH: 133.018 u[IU]/mL — ABNORMAL HIGH (ref 0.350–4.500)

## 2023-01-10 MED ORDER — SODIUM CHLORIDE 0.9 % IV SOLN: Freq: Once | INTRAVENOUS | Status: AC

## 2023-01-10 MED ORDER — SODIUM CHLORIDE 0.9% FLUSH
10.0000 mL | INTRAVENOUS | Status: DC | PRN
  Administered 2023-01-10: 10 mL

## 2023-01-10 MED ORDER — DURVALUMAB 500 MG/10ML IV SOLN
1500.0000 mg | Freq: Once | INTRAVENOUS | Status: AC
  Administered 2023-01-10: 1500 mg via INTRAVENOUS
  Filled 2023-01-10: qty 30

## 2023-01-10 MED ORDER — HEPARIN SOD (PORK) LOCK FLUSH 100 UNIT/ML IV SOLN
500.0000 [IU] | Freq: Once | INTRAVENOUS | Status: AC | PRN
  Administered 2023-01-10: 500 [IU]

## 2023-01-10 NOTE — Patient Instructions (Signed)

## 2023-01-10 NOTE — Progress Notes (Signed)
[facility] OFFICE PROGRESS NOTE   Diagnosis: Cholangiocarcinoma  INTERVAL HISTORY:   Jeffrey Blevins returns as scheduled.  He completed another cycle of Durvalumab 12/15/2022.  He denies nausea/vomiting.  No diarrhea.  No rash.  Stable numbness in the toes.  Hand pain in the morning hours.  No abdominal pain.  He has a good appetite.  Objective:  Vital signs in last 24 hours:  Blood pressure 118/80, pulse 63, temperature 98.1 F (36.7 C), temperature source Temporal, resp. rate 18, height 6\' 2"  (1.88 m), weight 219 lb (99.3 kg), SpO2 100%.    HEENT: No thrush or ulcers. Resp: Lungs clear bilaterally. Cardio: Regular rate and rhythm. GI: Abdomen soft and nontender.  No hepatosplenomegaly.  No mass. Vascular: No leg edema. Skin: No rash. Port-A-Cath without erythema.  Lab Results:  Lab Results  Component Value Date   WBC 7.1 01/10/2023   HGB 13.9 01/10/2023   HCT 40.0 01/10/2023   MCV 85.8 01/10/2023   PLT 159 01/10/2023   NEUTROABS 3.7 01/10/2023    Imaging:  No results found.  Medications: I have reviewed the patient's current medications.  Assessment/Plan: Liver mass concerning for malignancy -CT abdomen/pelvis with contrast 08/14/1998 23-2.9 x 2.7 x 2.7 cm hypoenhancing mass in segment 5 near the liver hilum just above hepatic ductal confluence suspicious for primary bile duct malignancy or primary hepatic malignancy -MRCP 08/14/2021-irregular mass of segment 8 of the liver with perilesional enhancement concerning for cholangiocarcinoma -08/14/2021 CA 19.9 was elevated at 76 -08/16/2021 AFP 6.4 -08/16/2021 cytology from bile duct brushing showed atypical cells -08/19/2021 cytology from bile duct brushing showed cells suspicious for malignancy -08/25/2021 CT-guided liver biopsy-moderately differentiated adenocarcinoma, CK7 positive, CDX2 positive in a small population of cells, TTF-1 negative, CK20 negative-consistent with intrahepatic cholangiocarcinoma  versus metastatic disease from a pancreaticobiliary primary -CTs at [facility] 09/06/2021-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, occlusion of the anterior branch of the right portal vein, periportal enhancement and thickening on delayed imaging potentially due to tumor infiltration, no change in the 2 x 3.2 cm mass in segment 8, mild to moderate left and right intrahepatic biliary dilatation, biliary stent in place, small portacaval and periportal nodes, no evidence of metastatic disease in the chest -Cycle 1 gemcitabine/cisplatin/durvalumab 10/01/2021 -Cycle 2 gemcitabine/cisplatin/durvalumab 10/22/2021 -Cycle 3 gemcitabine/cisplatin/durvalumab 11/11/2021, D1 cisplatin held due to neuropathy, cisplatin resumed day 8 -Cycle 4 gemcitabine/cisplatin/durvalumab 12/03/2021 -MRI abdomen 12/15/2021-segment 8 cholangiocarcinoma no longer measurable, residual biliary duct dilation in the anterior right and left hepatic lobes, no evidence of metastatic disease persistent anterior right and new peripheral left portal vein occlusion -Cycle 5 gemcitabine/cisplatin/durvalumab 12/24/2021 -Cycle 6 gemcitabine/cisplatin/durvalumab 01/13/2022 -CTs at [facility] 02/14/2022-fine hypoattenuating masses in the left and right hepatic lobes, more prominent compared to the 12/15/2021 MRI, potentially representing multifocal cholangiocarcinoma, persistent occlusion of left portal and anterior branch of the right portal vein, no extrahepatic lymphadenopathy or metastatic disease -MRI liver 12/19 /2023-1.3 x 0.8 cm hypoenhancing lesion in segment 8, ill-defined area of delayed enhancement in the caudate measuring 1.7 cm, no left liver lesion, 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  Partial hepatectomy anatomy.  Marked improvement postsurgical  fluid collections.  Biliary stent in place.  Mild biliary duct dilatation in the central hepatic lobes.  No evidence of metastatic disease in the abdomen/pelvis. -07/27/2022 continue every 4-week Durvalumab -CTs 10/18/2022-partial left hepatectomy, common hepatic and bile duct stent unchanged with unchanged segmental intrahepatic biliary duct dilation in the remnant left lobe and anterior right lobe.  No evidence of metastatic disease in the chest, abdomen, or pelvis.  Unchanged 0.5 cm subpleural nodule in the right middle lobe -10/19/2022 continue every 4-week Durvalumab 2.  Obstructive jaundice -08/16/2021 percutaneous biliary drain placement 3.  Hepatic steatosis 4.  GERD 5.  Hypertension 6.  Normocytic anemia 7.  Enlarged prostate seen on CT 8.  Tobacco use 9.  Enterococcus and Aerococcus bacteremia 08/20/2021-discharged 08/26/2021 to complete outpatient course of Augmentin 10.  Peripheral neuropathy 11.  Port-A-Cath placement 09/23/2021 12.  Biliary drain exchange 09/23/2021; drain capped 10/12/2021 13.  Rigors following biliary drain procedure 09/23/2021, biliary drain culture-Enterobacter Cloacae, Bactrim x7 days 09/27/2021 14.  Right leg edema and pain 10/18/2021-Doppler negative for DVT 15.  Admission 03/18/2022 with a right upper quadrant abscess, status post drain placement, culture positive for Klebsiella CTs 03/21/2022-no change in moderate volume ascites, interval decrease in subhepatic air-filled collection with catheter in appropriate position, resolved right pneumothorax, no change in bilateral pleural effusions-moderate on right and small on left CTs 04/25/2022 at [facility]-3.1 x 2.0 cm  fluid collection in the abdomen and pelvis, decreased in volume and increased in organization; resolution of subhepatic collection; increased partially visualized large right pleural effusion with near complete collapse of the right lower lobe. Drainage catheter removed 04/25/2022 16.  Large right pleural effusion on CT 04/25/2022-thoracentesis 04/29/2022, 1.75 L of pleural fluid removed; culture negative, cytology with reactive mesothelial cells present, acute inflammation  Disposition: Mr. Cashner appears stable.  He continues every 4-week Durvalumab.  He is tolerating well.  There is no clinical evidence of disease progression.  Plan to proceed with treatment today as scheduled.  Follow-up CTs prior to next office visit.  CBC and chemistry panel reviewed.  Labs adequate to proceed as above.  He will return for follow-up in 4 weeks.  He will contact the office in the interim with any problems.    Lonna Cobb ANP/GNP-BC   01/10/2023  9:44 AM

## 2023-01-10 NOTE — Patient Instructions (Signed)
[facility] - A DEPT OF [facility]   Discharge Instructions: Thank you for choosing [facility] to provide your oncology and hematology care.   If you have a lab appointment with the Cancer Center, please go directly to the Cancer Center and check in at the registration area.   Wear comfortable clothing and clothing appropriate for easy access to any Portacath or PICC line.   We strive to give you quality time with your provider. You may need to reschedule your appointment if you arrive late (15 or more minutes).  Arriving late affects you and other patients whose appointments are after yours.  Also, if you miss three or more appointments without notifying the office, you may be dismissed from the clinic at the provider's discretion.      For prescription refill requests, have your pharmacy contact our office and allow 72 hours for refills to be completed.    Today you received the following chemotherapy and/or immunotherapy agents Durvalumab (IMFINZI).      To help prevent nausea and vomiting after your treatment, we encourage you to take your nausea medication as directed.  BELOW ARE SYMPTOMS THAT SHOULD BE REPORTED IMMEDIATELY: *FEVER GREATER THAN 100.4 F (38 C) OR HIGHER *CHILLS OR SWEATING *NAUSEA AND VOMITING THAT IS NOT CONTROLLED WITH YOUR NAUSEA MEDICATION *UNUSUAL SHORTNESS OF BREATH *UNUSUAL BRUISING OR BLEEDING *URINARY PROBLEMS (pain or burning when urinating, or frequent urination) *BOWEL PROBLEMS (unusual diarrhea, constipation, pain near the anus) TENDERNESS IN MOUTH AND THROAT WITH OR WITHOUT PRESENCE OF ULCERS (sore throat, sores in mouth, or a toothache) UNUSUAL RASH, SWELLING OR PAIN  UNUSUAL VAGINAL DISCHARGE OR ITCHING   Items with * indicate a potential emergency and should be followed up as soon as possible or go to the Emergency Department if any problems should occur.  Please show the CHEMOTHERAPY ALERT CARD or  IMMUNOTHERAPY ALERT CARD at check-in to the Emergency Department and triage nurse.  Should you have questions after your visit or need to cancel or reschedule your appointment, please contact [facility] - A DEPT [facility]  Dept: [phone]  and follow the prompts.  Office hours are 8:00 a.m. to 4:30 p.m. Monday - Friday. Please note that voicemails left after 4:00 p.m. may not be returned until the following business day.  We are closed weekends and major holidays. You have access to a nurse at all times for urgent questions. Please call the main number to the clinic Dept: [phone] and follow the prompts.   For any non-urgent questions, you may also contact your provider using MyChart. We now offer e-Visits for anyone 5 and older to request care online for non-urgent symptoms. For details visit [email or web address].   Also download the MyChart app! Go to the app store, search "MyChart", open the app, select [facility], and log in with your MyChart username and password.  Durvalumab Injection What is this medication? DURVALUMAB (dur VAL ue mab) treats some types of cancer. It works by helping your immune system slow or stop the spread of cancer cells. It is a monoclonal antibody. This medicine may be used for other purposes; ask your health care provider or pharmacist if you have questions. COMMON BRAND NAME(S): IMFINZI What should I tell my care team before I take this medication? They need to know if you have any of these conditions: Allogeneic stem cell transplant (uses someone else's stem cells) Autoimmune diseases, such  as Crohn disease, ulcerative colitis, lupus History of chest radiation Nervous system problems, such as Guillain-Barre syndrome, myasthenia gravis Organ transplant An unusual or allergic reaction to durvalumab, other medications, foods, dyes, or preservatives Pregnant or trying to get pregnant Breast-feeding How should I  use this medication? This medication is infused into a vein. It is given by your care team in a hospital or clinic setting. A special MedGuide will be given to you before each treatment. Be sure to read this information carefully each time. Talk to your care team about the use of this medication in children. Special care may be needed. Overdosage: If you think you have taken too much of this medicine contact a poison control center or emergency room at once. NOTE: This medicine is only for you. Do not share this medicine with others. What if I miss a dose? Keep appointments for follow-up doses. It is important not to miss your dose. Call your care team if you are unable to keep an appointment. What may interact with this medication? Interactions have not been studied. This list may not describe all possible interactions. Give your health care provider a list of all the medicines, herbs, non-prescription drugs, or dietary supplements you use. Also tell them if you smoke, drink alcohol, or use illegal drugs. Some items may interact with your medicine. What should I watch for while using this medication? Your condition will be monitored carefully while you are receiving this medication. You may need blood work while taking this medication. This medication may cause serious skin reactions. They can happen weeks to months after starting the medication. Contact your care team right away if you notice fevers or flu-like symptoms with a rash. The rash may be red or purple and then turn into blisters or peeling of the skin. You may also notice a red rash with swelling of the face, lips, or lymph nodes in your neck or under your arms. Tell your care team right away if you have any change in your eyesight. Talk to your care team if you may be pregnant. Serious birth defects can occur if you take this medication during pregnancy and for 3 months after the last dose. You will need a negative pregnancy test before  starting this medication. Contraception is recommended while taking this medication and for 3 months after the last dose. Your care team can help you find the option that works for you. Do not breastfeed while taking this medication and for 3 months after the last dose. What side effects may I notice from receiving this medication? Side effects that you should report to your care team as soon as possible: Allergic reactions--skin rash, itching, hives, swelling of the face, lips, tongue, or throat Dry cough, shortness of breath or trouble breathing Eye pain, redness, irritation, or discharge with blurry or decreased vision Heart muscle inflammation--unusual weakness or fatigue, shortness of breath, chest pain, fast or irregular heartbeat, dizziness, swelling of the ankles, feet, or hands Hormone gland problems--headache, sensitivity to light, unusual weakness or fatigue, dizziness, fast or irregular heartbeat, increased sensitivity to cold or heat, excessive sweating, constipation, hair loss, increased thirst or amount of urine, tremors or shaking, irritability Infusion reactions--chest pain, shortness of breath or trouble breathing, feeling faint or lightheaded Kidney injury (glomerulonephritis)--decrease in the amount of urine, red or dark brown urine, foamy or bubbly urine, swelling of the ankles, hands, or feet Liver injury--right upper belly pain, loss of appetite, nausea, light-colored stool, dark yellow or brown urine,  yellowing skin or eyes, unusual weakness or fatigue Pain, tingling, or numbness in the hands or feet, muscle weakness, change in vision, confusion or trouble speaking, loss of balance or coordination, trouble walking, seizures Rash, fever, and swollen lymph nodes Redness, blistering, peeling, or loosening of the skin, including inside the mouth Sudden or severe stomach pain, bloody diarrhea, fever, nausea, vomiting Side effects that usually do not require medical attention  (report these to your care team if they continue or are bothersome): Bone, joint, or muscle pain Diarrhea Fatigue Loss of appetite Nausea Skin rash This list may not describe all possible side effects. Call your doctor for medical advice about side effects. You may report side effects to FDA at 1-800-FDA-1088. Where should I keep my medication? This medication is given in a hospital or clinic. It will not be stored at home. NOTE: This sheet is a summary. It may not cover all possible information. If you have questions about this medicine, talk to your doctor, pharmacist, or health care provider.  2024 Elsevier/Gold Standard (2021-06-29 00:00:00)

## 2023-01-10 NOTE — Progress Notes (Signed)
Patient seen by Lonna Cobb NP today  Vitals are within treatment parameters:Yes   Labs are within treatment parameters: Yes   Treatment plan has been signed: Yes   Per physician team, Patient is ready for treatment and there are NO modifications to the treatment plan.

## 2023-01-11 ENCOUNTER — Inpatient Hospital Stay: Payer: Managed Care, Other (non HMO) | Admitting: Nurse Practitioner

## 2023-01-11 ENCOUNTER — Inpatient Hospital Stay: Payer: Managed Care, Other (non HMO)

## 2023-01-11 ENCOUNTER — Inpatient Hospital Stay: Payer: Managed Care, Other (non HMO) | Attending: Oncology

## 2023-01-11 LAB — T4: T4, Total: 2.5 ug/dL — ABNORMAL LOW (ref 4.5–12.0)

## 2023-01-11 LAB — CANCER ANTIGEN 19-9: CA 19-9: 12 U/mL (ref 0–35)

## 2023-01-11 LAB — PSA: Prostate Specific Ag, Serum: 4.2 ng/mL — ABNORMAL HIGH (ref 0.0–4.0)

## 2023-01-11 LAB — HGB A1C W/O EAG: Hgb A1c MFr Bld: 5.5 % (ref 4.8–5.6)

## 2023-01-12 ENCOUNTER — Other Ambulatory Visit: Payer: Self-pay | Admitting: Nurse Practitioner

## 2023-01-12 DIAGNOSIS — E032 Hypothyroidism due to medicaments and other exogenous substances: Secondary | ICD-10-CM

## 2023-01-12 DIAGNOSIS — C221 Intrahepatic bile duct carcinoma: Secondary | ICD-10-CM

## 2023-01-12 LAB — T3, FREE: T3, Free: 1.8 pg/mL — ABNORMAL LOW (ref 2.0–4.4)

## 2023-01-12 LAB — SPECIMEN STATUS REPORT

## 2023-01-12 MED ORDER — LEVOTHYROXINE SODIUM 50 MCG PO TABS: 50.0000 ug | ORAL_TABLET | Freq: Every day | ORAL | 2 refills | Status: DC

## 2023-01-16 ENCOUNTER — Other Ambulatory Visit (HOSPITAL_BASED_OUTPATIENT_CLINIC_OR_DEPARTMENT_OTHER): Payer: Self-pay | Admitting: Family Medicine

## 2023-01-16 DIAGNOSIS — R972 Elevated prostate specific antigen [PSA]: Secondary | ICD-10-CM

## 2023-01-16 NOTE — Progress Notes (Signed)
Please let patient know that I recommend repeat of his PSA in approx. 6 weeks at time of upcoming lab draw. I will add it to his future labs. Make sure he is scheduled with [place] or Oncology.

## 2023-01-16 NOTE — Progress Notes (Signed)
Oncology managing hypothyroidism per chart review.

## 2023-01-19 ENCOUNTER — Other Ambulatory Visit: Payer: Self-pay

## 2023-02-02 ENCOUNTER — Ambulatory Visit (HOSPITAL_BASED_OUTPATIENT_CLINIC_OR_DEPARTMENT_OTHER)
Admission: RE | Admit: 2023-02-02 | Discharge: 2023-02-02 | Disposition: A | Discharge status: Home / Self Care | Payer: Managed Care, Other (non HMO) | Source: Ambulatory Visit | Attending: Nurse Practitioner | Admitting: Nurse Practitioner

## 2023-02-02 DIAGNOSIS — C221 Intrahepatic bile duct carcinoma: Secondary | ICD-10-CM | POA: Diagnosis present

## 2023-02-02 MED ORDER — IOHEXOL 300 MG/ML  SOLN
100.0000 mL | Freq: Once | INTRAMUSCULAR | Status: AC | PRN
  Administered 2023-02-02: 100 mL via INTRAVENOUS

## 2023-02-02 MED ORDER — HEPARIN SOD (PORK) LOCK FLUSH 100 UNIT/ML IV SOLN: 500.0000 [IU] | Freq: Once | INTRAVENOUS | Status: DC

## 2023-02-05 ENCOUNTER — Other Ambulatory Visit: Payer: Self-pay | Admitting: Oncology

## 2023-02-06 ENCOUNTER — Other Ambulatory Visit (HOSPITAL_BASED_OUTPATIENT_CLINIC_OR_DEPARTMENT_OTHER): Payer: Self-pay | Admitting: Family Medicine

## 2023-02-06 DIAGNOSIS — R972 Elevated prostate specific antigen [PSA]: Secondary | ICD-10-CM

## 2023-02-06 DIAGNOSIS — R7301 Impaired fasting glucose: Secondary | ICD-10-CM

## 2023-02-06 NOTE — Telephone Encounter (Unsigned)
Copied from CRM [id number]. Topic: Clinical - Request for Lab/Test Order >> Feb 06, 2023 12:29 PM Chantha C wrote: Reason for CRM: Pls c/b pt [phone], he needs clarification on his pending labs orders. He didn't do lipids last time bc he ate, he has an upcoming appt for cpx.

## 2023-02-07 ENCOUNTER — Encounter: Payer: Self-pay | Admitting: Oncology

## 2023-02-09 ENCOUNTER — Ambulatory Visit: Payer: Managed Care, Other (non HMO)

## 2023-02-09 ENCOUNTER — Inpatient Hospital Stay: Payer: Managed Care, Other (non HMO) | Attending: Oncology

## 2023-02-09 ENCOUNTER — Ambulatory Visit: Payer: Managed Care, Other (non HMO) | Admitting: Nurse Practitioner

## 2023-02-09 ENCOUNTER — Inpatient Hospital Stay: Payer: Managed Care, Other (non HMO) | Admitting: Oncology

## 2023-02-09 ENCOUNTER — Other Ambulatory Visit: Payer: Managed Care, Other (non HMO)

## 2023-02-09 ENCOUNTER — Inpatient Hospital Stay: Payer: Managed Care, Other (non HMO)

## 2023-02-09 VITALS — BP 130/87 | HR 60 | Temp 98.1°F | Resp 18 | Ht 74.0 in | Wt 219.0 lb

## 2023-02-09 VITALS — BP 139/90 | HR 59 | Resp 18

## 2023-02-09 DIAGNOSIS — C221 Intrahepatic bile duct carcinoma: Secondary | ICD-10-CM

## 2023-02-09 DIAGNOSIS — Z5112 Encounter for antineoplastic immunotherapy: Secondary | ICD-10-CM | POA: Diagnosis present

## 2023-02-09 DIAGNOSIS — Z79620 Long term (current) use of immunosuppressive biologic: Secondary | ICD-10-CM | POA: Diagnosis not present

## 2023-02-09 DIAGNOSIS — E032 Hypothyroidism due to medicaments and other exogenous substances: Secondary | ICD-10-CM

## 2023-02-09 LAB — CBC WITH DIFFERENTIAL (CANCER CENTER ONLY)
Abs Immature Granulocytes: 0.01 10*3/uL (ref 0.00–0.07)
Basophils Absolute: 0 10*3/uL (ref 0.0–0.1)
Basophils Relative: 0 %
Eosinophils Absolute: 0.3 10*3/uL (ref 0.0–0.5)
Eosinophils Relative: 6 %
HCT: 37.4 % — ABNORMAL LOW (ref 39.0–52.0)
Hemoglobin: 13 g/dL (ref 13.0–17.0)
Immature Granulocytes: 0 %
Lymphocytes Relative: 41 %
Lymphs Abs: 2.1 10*3/uL (ref 0.7–4.0)
MCH: 30.2 pg (ref 26.0–34.0)
MCHC: 34.8 g/dL (ref 30.0–36.0)
MCV: 86.8 fL (ref 80.0–100.0)
Monocytes Absolute: 0.3 10*3/uL (ref 0.1–1.0)
Monocytes Relative: 5 %
Neutro Abs: 2.5 10*3/uL (ref 1.7–7.7)
Neutrophils Relative %: 48 %
Platelet Count: 161 10*3/uL (ref 150–400)
RBC: 4.31 MIL/uL (ref 4.22–5.81)
RDW: 13.8 % (ref 11.5–15.5)
WBC Count: 5.2 10*3/uL (ref 4.0–10.5)
nRBC: 0 % (ref 0.0–0.2)

## 2023-02-09 LAB — CMP (CANCER CENTER ONLY)
ALT: 19 U/L (ref 0–44)
AST: 25 U/L (ref 15–41)
Albumin: 4.2 g/dL (ref 3.5–5.0)
Alkaline Phosphatase: 85 U/L (ref 38–126)
Anion gap: 6 (ref 5–15)
BUN: 17 mg/dL (ref 6–20)
CO2: 27 mmol/L (ref 22–32)
Calcium: 9.1 mg/dL (ref 8.9–10.3)
Chloride: 106 mmol/L (ref 98–111)
Creatinine: 1.04 mg/dL (ref 0.61–1.24)
GFR, Estimated: >60 mL/min (ref >60)
Glucose, Bld: 91 mg/dL (ref 70–99)
Potassium: 4.2 mmol/L (ref 3.5–5.1)
Sodium: 139 mmol/L (ref 135–145)
Total Bilirubin: 0.6 mg/dL (ref <1.2)
Total Protein: 6.9 g/dL (ref 6.5–8.1)

## 2023-02-09 LAB — TSH: TSH: 90.401 u[IU]/mL — ABNORMAL HIGH (ref 0.350–4.500)

## 2023-02-09 MED ORDER — DURVALUMAB 500 MG/10ML IV SOLN
1500.0000 mg | Freq: Once | INTRAVENOUS | Status: AC
  Administered 2023-02-09: 1500 mg via INTRAVENOUS
  Filled 2023-02-09: qty 30

## 2023-02-09 MED ORDER — HEPARIN SOD (PORK) LOCK FLUSH 100 UNIT/ML IV SOLN
500.0000 [IU] | Freq: Once | INTRAVENOUS | Status: AC | PRN
  Administered 2023-02-09: 500 [IU]

## 2023-02-09 MED ORDER — SODIUM CHLORIDE 0.9 % IV SOLN: Freq: Once | INTRAVENOUS | Status: AC

## 2023-02-09 MED ORDER — SODIUM CHLORIDE 0.9% FLUSH
10.0000 mL | INTRAVENOUS | Status: DC | PRN
  Administered 2023-02-09: 10 mL

## 2023-02-09 NOTE — Progress Notes (Signed)
Patient seen by Dr. Thornton Papas today  Vitals are within treatment parameters:Yes   Labs are within treatment parameters: Yes   Treatment plan has been signed: Yes   Per physician team, Patient is ready for treatment and there are NO modifications to the treatment plan.

## 2023-02-09 NOTE — Progress Notes (Signed)
[facility] OFFICE PROGRESS NOTE   Diagnosis: Cholangiocarcinoma  INTERVAL HISTORY:   Mr. Scelsi returns as scheduled.  He feels well.  No rash or diarrhea.  He had a recent cold.  He reports mild soreness of the tongue. He is maintained on thyroid hormone replacement. Objective:  Vital signs in last 24 hours:  Blood pressure 130/87, pulse 60, temperature 98.1 F (36.7 C), temperature source Temporal, resp. rate 18, height 6\' 2"  (1.88 m), weight 219 lb (99.3 kg), SpO2 100%.    HEENT: Mild white coat of the tongue, no buccal thrush or ulcers.  Healing ulcer at the upper outer lip Lymphatics: No cervical, supraclavicular, axillary, or inguinal nodes Resp: Lungs clear bilaterally Cardio: Regular rate and rhythm GI: No hepatosplenomegaly, no mass Vascular: No leg edema   Portacath/PICC-without erythema  Lab Results:  Lab Results  Component Value Date   WBC 5.2 02/09/2023   HGB 13.0 02/09/2023   HCT 37.4 (L) 02/09/2023   MCV 86.8 02/09/2023   PLT 161 02/09/2023   NEUTROABS 2.5 02/09/2023    CMP  Lab Results  Component Value Date   NA 136 01/10/2023   K 4.1 01/10/2023   CL 102 01/10/2023   CO2 28 01/10/2023   GLUCOSE 93 01/10/2023   BUN 13 01/10/2023   CREATININE 1.10 01/10/2023   CALCIUM 10.2 01/10/2023   PROT 7.3 01/10/2023   ALBUMIN 4.3 01/10/2023   AST 29 01/10/2023   ALT 22 01/10/2023   ALKPHOS 82 01/10/2023   BILITOT 0.6 01/10/2023   GFRNONAA >60 01/10/2023    Lab Results  Component Value Date   XLK440 12 01/10/2023    Lab Results  Component Value Date   INR 1.1 08/25/2021   LABPROT 13.9 08/25/2021    Imaging:  No results found.  Medications: I have reviewed the patient's current medications.   Assessment/Plan: Liver mass concerning for malignancy -CT abdomen/pelvis with contrast 08/14/1998 23-2.9 x 2.7 x 2.7 cm hypoenhancing mass in segment 5 near the liver hilum just above hepatic ductal confluence suspicious for primary  bile duct malignancy or primary hepatic malignancy -MRCP 08/14/2021-irregular mass of segment 8 of the liver with perilesional enhancement concerning for cholangiocarcinoma -08/14/2021 CA 19.9 was elevated at 76 -08/16/2021 AFP 6.4 -08/16/2021 cytology from bile duct brushing showed atypical cells -08/19/2021 cytology from bile duct brushing showed cells suspicious for malignancy -08/25/2021 CT-guided liver biopsy-moderately differentiated adenocarcinoma, CK7 positive, CDX2 positive in a small population of cells, TTF-1 negative, CK20 negative-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 -[facility] 09/06/2021-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, occlusion of the anterior branch of the right portal vein, periportal enhancement and thickening on delayed imaging potentially due to tumor infiltration, no change in the 2 x 3.2 cm mass in segment 8, mild to moderate left and right intrahepatic biliary dilatation, biliary stent in place, small portacaval and periportal nodes, no evidence of metastatic disease in the chest -Cycle 1 gemcitabine/cisplatin/durvalumab 10/01/2021 -Cycle 2 gemcitabine/cisplatin/durvalumab 10/22/2021 -Cycle 3 gemcitabine/cisplatin/durvalumab 11/11/2021, D1 cisplatin held due to neuropathy, cisplatin resumed day 8 -Cycle 4 gemcitabine/cisplatin/durvalumab 12/03/2021 -MRI abdomen 12/15/2021-segment 8 cholangiocarcinoma no longer measurable, residual biliary duct dilation in the anterior right and left hepatic lobes, no evidence of metastatic disease persistent anterior right and new peripheral left portal vein occlusion -Cycle 5 gemcitabine/cisplatin/durvalumab 12/24/2021 -Cycle 6 gemcitabine/cisplatin/durvalumab 01/13/2022 -CTs at [facility] 02/14/2022-fine hypoattenuating masses in the left and right hepatic lobes, more prominent compared to the 12/15/2021 MRI, potentially representing multifocal cholangiocarcinoma,  persistent  occlusion of left portal and anterior branch of the right portal vein, no extrahepatic lymphadenopathy or metastatic disease -MRI liver 12/19 /2023-1.3 x 0.8 cm hypoenhancing lesion in segment 8, ill-defined area of delayed enhancement in the caudate measuring 1.7 cm, no left liver lesion, 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  Partial hepatectomy anatomy.  Marked improvement postsurgical fluid collections.  Biliary stent in place.  Mild biliary duct dilatation in the central hepatic lobes.  No evidence of metastatic disease in the abdomen/pelvis. -07/27/2022 continue every 4-week Durvalumab -CTs 10/18/2022-partial left hepatectomy, common hepatic and bile duct stent unchanged with unchanged segmental intrahepatic biliary duct dilation in the remnant left lobe and anterior right lobe.  No evidence of metastatic disease in the chest, abdomen, or pelvis.  Unchanged 0.5 cm subpleural nodule in the right middle lobe -10/19/2022 continue every 4-week Durvalumab -CTs 02/02/2023-no evidence of disease progression, surgical treatments at the anterior margin of the right liver, left hepatectomy, stable right middle lobe nodule -02/09/2023-every 4-week Durvalumab continued 2.  Obstructive jaundice -08/16/2021 percutaneous biliary drain placement 3.  Hepatic steatosis 4.  GERD 5.  Hypertension 6.  Normocytic anemia 7.  Enlarged prostate seen on CT 8.  Tobacco use 9.  Enterococcus and Aerococcus bacteremia  08/20/2021-discharged 08/26/2021 to complete outpatient course of Augmentin 10.  Peripheral neuropathy 11.  Port-A-Cath placement 09/23/2021 12.  Biliary drain exchange 09/23/2021; drain capped 10/12/2021 13.  Rigors following biliary drain procedure 09/23/2021, biliary drain culture-Enterobacter Cloacae, Bactrim x7 days 09/27/2021 14.  Right leg edema and pain 10/18/2021-Doppler negative for DVT 15.  Admission 03/18/2022 with a right upper quadrant abscess, status post drain placement, culture positive for Klebsiella CTs 03/21/2022-no change in moderate volume ascites, interval decrease in subhepatic air-filled collection with catheter in appropriate position, resolved right pneumothorax, no change in bilateral pleural effusions-moderate on right and small on left CTs 04/25/2022 at [facility]-3.1 x 2.0 cm fluid collection in the abdomen and pelvis, decreased in volume and increased in organization; resolution of subhepatic collection; increased partially visualized large right pleural effusion with near complete collapse of the right lower lobe. Drainage catheter removed 04/25/2022 16.  Large right pleural effusion on CT 04/25/2022-thoracentesis 04/29/2022, 1.75 L of pleural fluid removed; culture negative, cytology with reactive mesothelial cells present, acute inflammation 17.  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 Kingry remains in clinical remission from cholangiocarcinoma.  He will continue monthly durvalumab.  He will return for an office and lab visit in 1 month.  He is now maintained on thyroid hormone replacement.  Will follow-up on the TSH and T4 from today.  Thornton Papas, MD  02/09/2023  8:58 AM

## 2023-02-09 NOTE — Patient Instructions (Signed)
[facility] A [facility] OF [facility]   Discharge Instructions: Thank you for choosing [facility] to provide your oncology and hematology care.   If you have a lab appointment with the Cancer Center, please go directly to the Cancer Center and check in at the registration area.   Wear comfortable clothing and clothing appropriate for easy access to any Portacath or PICC line.   We strive to give you quality time with your provider. You may need to reschedule your appointment if you arrive late (15 or more minutes).  Arriving late affects you and other patients whose appointments are after yours.  Also, if you miss three or more appointments without notifying the office, you may be dismissed from the clinic at the provider's discretion.      For prescription refill requests, have your pharmacy contact our office and allow 72 hours for refills to be completed.    Today you received the following chemotherapy and/or immunotherapy agents Durvalumab (IMFINZI).      To help prevent nausea and vomiting after your treatment, we encourage you to take your nausea medication as directed.  BELOW ARE SYMPTOMS THAT SHOULD BE REPORTED IMMEDIATELY: *FEVER GREATER THAN 100.4 F (38 C) OR HIGHER *CHILLS OR SWEATING *NAUSEA AND VOMITING THAT IS NOT CONTROLLED WITH YOUR NAUSEA MEDICATION *UNUSUAL SHORTNESS OF BREATH *UNUSUAL BRUISING OR BLEEDING *URINARY PROBLEMS (pain or burning when urinating, or frequent urination) *BOWEL PROBLEMS (unusual diarrhea, constipation, pain near the anus) TENDERNESS IN MOUTH AND THROAT WITH OR WITHOUT PRESENCE OF ULCERS (sore throat, sores in mouth, or a toothache) UNUSUAL RASH, SWELLING OR PAIN  UNUSUAL VAGINAL DISCHARGE OR ITCHING   Items with * indicate a potential emergency and should be followed up as soon as possible or go to the Emergency Department if any problems should occur.  Please show the CHEMOTHERAPY ALERT CARD or  IMMUNOTHERAPY ALERT CARD at check-in to the Emergency Department and triage nurse.  Should you have questions after your visit or need to cancel or reschedule your appointment, please contact [facility]  Dept: [phone]  and follow the prompts.  Office hours are 8:00 a.m. to 4:30 p.m. Monday - Friday. Please note that voicemails left after 4:00 p.m. may not be returned until the following business day.  We are closed weekends and major holidays. You have access to a nurse at all times for urgent questions. Please call the main number to the clinic Dept: [phone] and follow the prompts.   For any non-urgent questions, you may also contact your provider using MyChart. We now offer e-Visits for anyone 23 and older to request care online for non-urgent symptoms. For details visit [email or web address].   Also download the MyChart app! Go to the app store, search "MyChart", open the app, select [facility], and log in with your MyChart username and password.  Durvalumab Injection What is this medication? DURVALUMAB (dur VAL ue mab) treats some types of cancer. It works by helping your immune system slow or stop the spread of cancer cells. It is a monoclonal antibody. This medicine may be used for other purposes; ask your health care provider or pharmacist if you have questions. COMMON BRAND NAME(S): IMFINZI What should I tell my care team before I take this medication? They need to know if you have any of these conditions: Allogeneic stem cell transplant (uses someone else's stem cells) Autoimmune diseases, such  as Crohn disease, ulcerative colitis, lupus History of chest radiation Nervous system problems, such as Guillain-Barre syndrome, myasthenia gravis Organ transplant An unusual or allergic reaction to durvalumab, other medications, foods, dyes, or preservatives Pregnant or trying to get pregnant Breast-feeding How should I use  this medication? This medication is infused into a vein. It is given by your care team in a hospital or clinic setting. A special MedGuide will be given to you before each treatment. Be sure to read this information carefully each time. Talk to your care team about the use of this medication in children. Special care may be needed. Overdosage: If you think you have taken too much of this medicine contact a poison control center or emergency room at once. NOTE: This medicine is only for you. Do not share this medicine with others. What if I miss a dose? Keep appointments for follow-up doses. It is important not to miss your dose. Call your care team if you are unable to keep an appointment. What may interact with this medication? Interactions have not been studied. This list may not describe all possible interactions. Give your health care provider a list of all the medicines, herbs, non-prescription drugs, or dietary supplements you use. Also tell them if you smoke, drink alcohol, or use illegal drugs. Some items may interact with your medicine. What should I watch for while using this medication? Your condition will be monitored carefully while you are receiving this medication. You may need blood work while taking this medication. This medication may cause serious skin reactions. They can happen weeks to months after starting the medication. Contact your care team right away if you notice fevers or flu-like symptoms with a rash. The rash may be red or purple and then turn into blisters or peeling of the skin. You may also notice a red rash with swelling of the face, lips, or lymph nodes in your neck or under your arms. Tell your care team right away if you have any change in your eyesight. Talk to your care team if you may be pregnant. Serious birth defects can occur if you take this medication during pregnancy and for 3 months after the last dose. You will need a negative pregnancy test before  starting this medication. Contraception is recommended while taking this medication and for 3 months after the last dose. Your care team can help you find the option that works for you. Do not breastfeed while taking this medication and for 3 months after the last dose. What side effects may I notice from receiving this medication? Side effects that you should report to your care team as soon as possible: Allergic reactions--skin rash, itching, hives, swelling of the face, lips, tongue, or throat Dry cough, shortness of breath or trouble breathing Eye pain, redness, irritation, or discharge with blurry or decreased vision Heart muscle inflammation--unusual weakness or fatigue, shortness of breath, chest pain, fast or irregular heartbeat, dizziness, swelling of the ankles, feet, or hands Hormone gland problems--headache, sensitivity to light, unusual weakness or fatigue, dizziness, fast or irregular heartbeat, increased sensitivity to cold or heat, excessive sweating, constipation, hair loss, increased thirst or amount of urine, tremors or shaking, irritability Infusion reactions--chest pain, shortness of breath or trouble breathing, feeling faint or lightheaded Kidney injury (glomerulonephritis)--decrease in the amount of urine, red or dark brown urine, foamy or bubbly urine, swelling of the ankles, hands, or feet Liver injury--right upper belly pain, loss of appetite, nausea, light-colored stool, dark yellow or brown urine,  yellowing skin or eyes, unusual weakness or fatigue Pain, tingling, or numbness in the hands or feet, muscle weakness, change in vision, confusion or trouble speaking, loss of balance or coordination, trouble walking, seizures Rash, fever, and swollen lymph nodes Redness, blistering, peeling, or loosening of the skin, including inside the mouth Sudden or severe stomach pain, bloody diarrhea, fever, nausea, vomiting Side effects that usually do not require medical attention  (report these to your care team if they continue or are bothersome): Bone, joint, or muscle pain Diarrhea Fatigue Loss of appetite Nausea Skin rash This list may not describe all possible side effects. Call your doctor for medical advice about side effects. You may report side effects to FDA at 1-800-FDA-1088. Where should I keep my medication? This medication is given in a hospital or clinic. It will not be stored at home. NOTE: This sheet is a summary. It may not cover all possible information. If you have questions about this medicine, talk to your doctor, pharmacist, or health care provider.  2024 Elsevier/Gold Standard (2021-06-29 00:00:00)

## 2023-02-10 LAB — T4: T4, Total: 4.4 ug/dL — ABNORMAL LOW (ref 4.5–12.0)

## 2023-02-24 ENCOUNTER — Encounter: Payer: Self-pay | Admitting: Oncology

## 2023-03-09 ENCOUNTER — Inpatient Hospital Stay: Payer: 59

## 2023-03-09 ENCOUNTER — Encounter: Payer: Self-pay | Admitting: Nurse Practitioner

## 2023-03-09 ENCOUNTER — Inpatient Hospital Stay (HOSPITAL_BASED_OUTPATIENT_CLINIC_OR_DEPARTMENT_OTHER): Payer: 59 | Admitting: Nurse Practitioner

## 2023-03-09 ENCOUNTER — Encounter: Payer: Self-pay | Admitting: Oncology

## 2023-03-09 ENCOUNTER — Other Ambulatory Visit: Payer: Self-pay

## 2023-03-09 ENCOUNTER — Inpatient Hospital Stay: Payer: 59 | Attending: Oncology

## 2023-03-09 VITALS — BP 122/88 | HR 72 | Temp 98.1°F | Resp 18 | Ht 74.0 in | Wt 218.0 lb

## 2023-03-09 DIAGNOSIS — Z5112 Encounter for antineoplastic immunotherapy: Secondary | ICD-10-CM | POA: Diagnosis present

## 2023-03-09 DIAGNOSIS — Z23 Encounter for immunization: Secondary | ICD-10-CM

## 2023-03-09 DIAGNOSIS — Z720 Tobacco use: Secondary | ICD-10-CM | POA: Insufficient documentation

## 2023-03-09 DIAGNOSIS — C221 Intrahepatic bile duct carcinoma: Secondary | ICD-10-CM | POA: Diagnosis not present

## 2023-03-09 DIAGNOSIS — Z79620 Long term (current) use of immunosuppressive biologic: Secondary | ICD-10-CM | POA: Insufficient documentation

## 2023-03-09 LAB — CBC WITH DIFFERENTIAL (CANCER CENTER ONLY)
Abs Immature Granulocytes: 0.02 K/uL (ref 0.00–0.07)
Basophils Absolute: 0.1 K/uL (ref 0.0–0.1)
Basophils Relative: 1 %
Eosinophils Absolute: 0.3 K/uL (ref 0.0–0.5)
Eosinophils Relative: 5 %
HCT: 40 % (ref 39.0–52.0)
Hemoglobin: 14 g/dL (ref 13.0–17.0)
Immature Granulocytes: 0 %
Lymphocytes Relative: 31 %
Lymphs Abs: 2 K/uL (ref 0.7–4.0)
MCH: 30.8 pg (ref 26.0–34.0)
MCHC: 35 g/dL (ref 30.0–36.0)
MCV: 87.9 fL (ref 80.0–100.0)
Monocytes Absolute: 0.4 K/uL (ref 0.1–1.0)
Monocytes Relative: 6 %
Neutro Abs: 3.7 K/uL (ref 1.7–7.7)
Neutrophils Relative %: 57 %
Platelet Count: 156 K/uL (ref 150–400)
RBC: 4.55 MIL/uL (ref 4.22–5.81)
RDW: 13.2 % (ref 11.5–15.5)
WBC Count: 6.5 K/uL (ref 4.0–10.5)
nRBC: 0 % (ref 0.0–0.2)

## 2023-03-09 LAB — CMP (CANCER CENTER ONLY)
ALT: 17 U/L (ref 0–44)
AST: 19 U/L (ref 15–41)
Albumin: 4.4 g/dL (ref 3.5–5.0)
Alkaline Phosphatase: 84 U/L (ref 38–126)
Anion gap: 7 (ref 5–15)
BUN: 21 mg/dL — ABNORMAL HIGH (ref 6–20)
CO2: 26 mmol/L (ref 22–32)
Calcium: 9 mg/dL (ref 8.9–10.3)
Chloride: 105 mmol/L (ref 98–111)
Creatinine: 1.01 mg/dL (ref 0.61–1.24)
GFR, Estimated: >60 mL/min
Glucose, Bld: 111 mg/dL — ABNORMAL HIGH (ref 70–99)
Potassium: 3.8 mmol/L (ref 3.5–5.1)
Sodium: 138 mmol/L (ref 135–145)
Total Bilirubin: 0.5 mg/dL (ref 0.0–1.2)
Total Protein: 6.8 g/dL (ref 6.5–8.1)

## 2023-03-09 LAB — TSH: TSH: 57.69 u[IU]/mL — ABNORMAL HIGH (ref 0.350–4.500)

## 2023-03-09 MED ORDER — INFLUENZA VIRUS VACC SPLIT PF (FLUZONE) 0.5 ML IM SUSY
0.5000 mL | PREFILLED_SYRINGE | Freq: Once | INTRAMUSCULAR | Status: AC
  Administered 2023-03-09: 0.5 mL via INTRAMUSCULAR
  Filled 2023-03-09: qty 0.5

## 2023-03-09 MED ORDER — SODIUM CHLORIDE 0.9 % IV SOLN: Freq: Once | INTRAVENOUS | Status: AC

## 2023-03-09 MED ORDER — SODIUM CHLORIDE 0.9% FLUSH
10.0000 mL | INTRAVENOUS | Status: DC | PRN
  Administered 2023-03-09: 10 mL

## 2023-03-09 MED ORDER — DURVALUMAB 500 MG/10ML IV SOLN
1500.0000 mg | Freq: Once | INTRAVENOUS | Status: AC
  Administered 2023-03-09: 1500 mg via INTRAVENOUS
  Filled 2023-03-09: qty 30

## 2023-03-09 MED ORDER — HEPARIN SOD (PORK) LOCK FLUSH 100 UNIT/ML IV SOLN
500.0000 [IU] | Freq: Once | INTRAVENOUS | Status: AC | PRN
Start: 2023-03-09 — End: 2023-03-09
  Administered 2023-03-09: 500 [IU]

## 2023-03-09 NOTE — Progress Notes (Signed)
 Patient seen by Olam Ned NP today  Vitals are within treatment parameters:Yes   Labs are within treatment parameters: Yes   Treatment plan has been signed: Yes   Per physician team, Patient is ready for treatment and there are NO modifications to the treatment plan. Patient is request a influenza injection

## 2023-03-09 NOTE — Progress Notes (Signed)
 [facility] OFFICE PROGRESS NOTE   Diagnosis: Cholangiocarcinoma  INTERVAL HISTORY:   [facility] returns as scheduled.  He completed another treatment with Durvalumab  02/09/2023.  No nausea/vomiting.  No diarrhea.  No rash.  Mild pruritus intermittently at the lower legs and abdomen.  He notices dry skin in these areas.  Objective:  Vital signs in last 24 hours:  Blood pressure 122/88, pulse 72, temperature 98.1 F (36.7 C), temperature source Temporal, resp. rate 18, height 6' 2 (1.88 m), weight 218 lb (98.9 kg), SpO2 100%.    HEENT: No thrush or ulcers.  Resp: Lungs clear bilaterally. Cardio: Regular rate and rhythm. GI: Abdomen soft and nontender.  No hepatosplenomegaly. Vascular: No leg edema. Skin: No rash. Port-A-Cath without erythema.  Lab Results:  Lab Results  Component Value Date   WBC 6.5 03/09/2023   HGB 14.0 03/09/2023   HCT 40.0 03/09/2023   MCV 87.9 03/09/2023   PLT 156 03/09/2023   NEUTROABS 3.7 03/09/2023    Imaging:  No results found.  Medications: I have reviewed the patient's current medications.  Assessment/Plan: Liver mass concerning for malignancy -CT abdomen/pelvis with contrast 08/14/1998 23-2.9 x 2.7 x 2.7 cm hypoenhancing mass in segment 5 near the liver hilum just above hepatic ductal confluence suspicious for primary bile duct malignancy or primary hepatic malignancy -MRCP 08/14/2021-irregular mass of segment 8 of the liver with perilesional enhancement concerning for cholangiocarcinoma -08/14/2021 CA 19.9 was elevated at 76 -08/16/2021 AFP 6.4 -08/16/2021 cytology from bile duct brushing showed atypical cells -08/19/2021 cytology from bile duct brushing showed cells suspicious for malignancy -08/25/2021 CT-guided liver biopsy-moderately differentiated adenocarcinoma, CK7 positive, CDX2 positive in a small population of cells, TTF-1 negative, CK20 negative-consistent with intrahepatic cholangiocarcinoma versus metastatic disease  from a pancreaticobiliary primary -CTs at [facility] 09/06/2021-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, occlusion of the anterior branch of the right portal vein, periportal enhancement and thickening on delayed imaging potentially due to tumor infiltration, no change in the 2 x 3.2 cm mass in segment 8, mild to moderate left and right intrahepatic biliary dilatation, biliary stent in place, small portacaval and periportal nodes, no evidence of metastatic disease in the chest -Cycle 1 gemcitabine /cisplatin /durvalumab  10/01/2021 -Cycle 2 gemcitabine /cisplatin /durvalumab  10/22/2021 -Cycle 3 gemcitabine /cisplatin /durvalumab  11/11/2021, D1 cisplatin  held due to neuropathy, cisplatin  resumed day 8 -Cycle 4 gemcitabine /cisplatin /durvalumab  12/03/2021 -MRI abdomen 12/15/2021-segment 8 cholangiocarcinoma no longer measurable, residual biliary duct dilation in the anterior right and left hepatic lobes, no evidence of metastatic disease persistent anterior right and new peripheral left portal vein occlusion -Cycle 5 gemcitabine /cisplatin /durvalumab  12/24/2021 -Cycle 6 gemcitabine /cisplatin /durvalumab  01/13/2022 -CTs at [facility] 02/14/2022-fine hypoattenuating masses in the left and right hepatic lobes, more prominent compared to the 12/15/2021 MRI, potentially representing multifocal cholangiocarcinoma, persistent occlusion of left portal and anterior branch of the right portal vein, no extrahepatic lymphadenopathy or metastatic disease -MRI liver 12/19 /2023-1.3 x 0.8 cm hypoenhancing lesion in segment 8, ill-defined area of delayed enhancement in the caudate measuring 1.7 cm, no left liver lesion, 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  Partial hepatectomy anatomy.  Marked improvement postsurgical fluid collections.  Biliary stent in place.  Mild biliary duct dilatation in the central hepatic lobes.  No evidence of metastatic disease in the abdomen/pelvis. -07/27/2022 continue every 4-week Durvalumab  -CTs 10/18/2022-partial left hepatectomy, common hepatic and bile duct stent unchanged with unchanged segmental intrahepatic biliary duct dilation in the remnant left lobe and anterior right lobe.  No evidence of metastatic disease in the chest, abdomen, or pelvis.  Unchanged 0.5 cm subpleural nodule in the right middle lobe -10/19/2022 continue every 4-week Durvalumab  -CTs 02/02/2023-no evidence of disease progression, surgical treatments at the anterior margin of the right liver, left hepatectomy, stable right middle lobe nodule -02/09/2023-every 4-week Durvalumab  continued 2.  Obstructive jaundice -08/16/2021 percutaneous biliary drain placement 3.  Hepatic steatosis 4.  GERD 5.  Hypertension 6.  Normocytic anemia 7.  Enlarged prostate seen on CT 8.  Tobacco use 9.  Enterococcus and Aerococcus bacteremia 08/20/2021-discharged 08/26/2021 to complete outpatient course of Augmentin  10.  Peripheral neuropathy 11.  Port-A-Cath placement 09/23/2021 12.  Biliary drain exchange 09/23/2021; drain capped 10/12/2021 13.  Rigors following biliary drain procedure 09/23/2021, biliary drain culture-Enterobacter Cloacae, Bactrim  x7 days 09/27/2021 14.  Right leg edema and pain 10/18/2021-Doppler negative for DVT 15.  Admission 03/18/2022 with a right upper quadrant abscess, status post drain placement, culture positive for Klebsiella CTs 03/21/2022-no change in moderate volume ascites, interval decrease in subhepatic air-filled  collection with catheter in appropriate position, resolved right pneumothorax, no change in bilateral pleural effusions-moderate on right and small on left CTs 04/25/2022 at [facility]-3.1 x 2.0 cm fluid collection in the abdomen and pelvis, decreased in volume and increased in organization; resolution of subhepatic collection; increased partially visualized large right pleural effusion with near complete collapse of the right lower lobe. Drainage catheter removed 04/25/2022 16.  Large right pleural effusion on CT 04/25/2022-thoracentesis 04/29/2022, 1.75 L of pleural fluid removed; culture negative, cytology with reactive mesothelial cells present, acute inflammation 17.  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  He continues every 4-week Durvalumab .  There is no clinical evidence of disease progression.  Plan to continue the same, treatment today.  CBC and chemistry panel reviewed.  Labs adequate to proceed as above.  He would like to receive the influenza vaccine today.  He will return for follow-up and treatment in 4 weeks.  We are available to see him sooner if needed.    Olam Ned ANP/GNP-BC   03/09/2023  11:38 AM

## 2023-03-09 NOTE — Patient Instructions (Signed)

## 2023-03-11 LAB — T4: T4, Total: 5.7 ug/dL (ref 4.5–12.0)

## 2023-03-13 ENCOUNTER — Telehealth: Payer: Self-pay | Admitting: *Deleted

## 2023-03-13 NOTE — Telephone Encounter (Signed)
 Notified that TSH is still elevated, but MD wants him to continue same levothyroxine dose. Will recheck TSH and T4 at next visit, and if still elevated will increase dose. He agrees to this plan

## 2023-03-13 NOTE — Telephone Encounter (Signed)
-----   Message from Thornton Papas sent at 03/12/2023  8:14 AM EST ----- Please call patient, continue thyroid hormone at current dose, TSH,T4 next visit, will increase dose if TSH still high

## 2023-04-02 ENCOUNTER — Other Ambulatory Visit: Payer: Self-pay | Admitting: Oncology

## 2023-04-04 ENCOUNTER — Encounter (HOSPITAL_BASED_OUTPATIENT_CLINIC_OR_DEPARTMENT_OTHER): Payer: Self-pay | Admitting: *Deleted

## 2023-04-04 ENCOUNTER — Ambulatory Visit (INDEPENDENT_AMBULATORY_CARE_PROVIDER_SITE_OTHER): Payer: 59 | Admitting: Family Medicine

## 2023-04-04 ENCOUNTER — Encounter (HOSPITAL_BASED_OUTPATIENT_CLINIC_OR_DEPARTMENT_OTHER): Payer: Self-pay | Admitting: Family Medicine

## 2023-04-04 VITALS — BP 120/71 | HR 64 | Ht 74.0 in | Wt 223.9 lb

## 2023-04-04 DIAGNOSIS — R0683 Snoring: Secondary | ICD-10-CM

## 2023-04-04 DIAGNOSIS — R972 Elevated prostate specific antigen [PSA]: Secondary | ICD-10-CM | POA: Insufficient documentation

## 2023-04-04 DIAGNOSIS — Z0000 Encounter for general adult medical examination without abnormal findings: Secondary | ICD-10-CM

## 2023-04-04 DIAGNOSIS — E7800 Pure hypercholesterolemia, unspecified: Secondary | ICD-10-CM

## 2023-04-04 DIAGNOSIS — R7301 Impaired fasting glucose: Secondary | ICD-10-CM | POA: Diagnosis not present

## 2023-04-04 NOTE — Progress Notes (Signed)
 Subjective:   Jeffrey Blevins [birth year] 04/04/2023  CC: Chief Complaint  Patient presents with   Annual Exam    Patient is here for his physical. Only concern is snoring issues and spouse wonders if it could be sleep apnea.    HPI: Jeffrey Blevins is a 58 y.o. male with history of cholangiocarcinoma, hypertension,  IFG who presents for a routine health maintenance exam.  Labs collected at time of visit.   SLEEP APNEA: Jeffrey Blevins presents for concern of snoring. Patient's wife accompanies him today to this visit and reports significant snoring nightly. Pt states he was previously recommended to complete sleep study per cardiology but never did.   Daytime hypersomnolence:  yes Fatigue:  yes Insomnia:  No, patietn does report waking up during the night to urinate and reports significant mucous in his throat at that time.  Good sleep hygiene:  yes Snoring:  Yes Observed apnea by bed partner: yes   HEALTH SCREENINGS: - Vision Screening: up to date - Dental Visits: up to date - Testicular Exam: Declined - STD Screening: Declined - PSA (50+):  Patient's PSA in November was elevated and per recent CT ABD Pelvis 02/02/24 this did show an enlarged prostate gland. Pt was recommended to repeat his PSA in 6 weeks. He reports mild increase in urination at nighttime. Denies difficulty starting his stream.    Lab Results  Component Value Date   PSA1 4.2 (H) 01/10/2023     - Colonoscopy (45+):  Done with [facility] 07/16/2019 per chart review. Will obtain record to update.     - AAA Screening: Not applicable  Men age 60-75 who have ever smoked - Lung Cancer screening with low-dose CT: Not applicable-  Adults age 50-80 who are current cigarette smokers or quit within the last 15 years. Must have 20 pack year history.   Depression and Anxiety Screen done today and results listed below:     04/04/2023    1:42 PM 01/02/2023    1:53 PM  Depression screen PHQ 2/9  Decreased  Interest 0 0  Down, Depressed, Hopeless 0 0  PHQ - 2 Score 0 0  Altered sleeping 0 2  Tired, decreased energy 1 2  Change in appetite 0 1  Feeling bad or failure about yourself  0 1  Trouble concentrating 0 1  Moving slowly or fidgety/restless 0 1  Suicidal thoughts 0 0  PHQ-9 Score 1 8  Difficult doing work/chores Not difficult at all Somewhat difficult      04/04/2023    1:42 PM 01/02/2023    1:53 PM  GAD 7 : Generalized Anxiety Score  Nervous, Anxious, on Edge 0 1  Control/stop worrying 0 0  Worry too much - different things 0 0  Trouble relaxing 0 0  Restless 0 0  Easily annoyed or irritable 0 1  Afraid - awful might happen 0 1  Total GAD 7 Score 0 3  Anxiety Difficulty Not difficult at all Somewhat difficult    IMMUNIZATIONS:  - Tdap: Tetanus vaccination status reviewed: last tetanus booster within 10 years. - Influenza: Up to date - Pneumovax: Not applicable - Prevnar: Not applicable - Shingrix vaccine (50+):  Recommended   Past medical history, surgical history, medications, allergies, family history and social history reviewed with patient today and changes made to appropriate areas of the chart.   Past Medical History:  Diagnosis Date   Basal cell carcinoma 11/10/2022   rigght preauricular area  needs mohs   Cholangiocarcinoma (HCC)    Dx 07/2021.  Followed by Dr. Cloretta, Dr. Barbaraann at [facility]   GERD (gastroesophageal reflux disease)    Hiatal hernia    History of Barrett's esophagus    per pt dx yrs ago, but with last egd none noted 05/ 2021   History of esophageal dilatation    per pt hx several times last one approx. 2009 then 05/ 2021  for stricture   Hypertension    followed by pcp   Raynauds phenomenon    Right hydrocele     Past Surgical History:  Procedure Laterality Date   COLONOSCOPY WITH ESOPHAGOGASTRODUODENOSCOPY (EGD)  07/16/2019   last one   INGUINAL HERNIA REPAIR Bilateral    last one 1990s   IR CHOLANGIOGRAM EXISTING TUBE  10/12/2021    IR ENDOLUMINAL BX OF BILIARY TREE  08/19/2021   IR EXCHANGE BILIARY DRAIN  08/19/2021   IR EXCHANGE BILIARY DRAIN  09/23/2021   IR EXCHANGE BILIARY DRAIN  11/04/2021   IR IMAGING GUIDED PORT INSERTION  09/23/2021   IR INT EXT BILIARY DRAIN WITH CHOLANGIOGRAM  08/16/2021   SPERMATOCELECTOMY Right 10/09/2020   Procedure: SPERMATOCELECTOMY;  Surgeon: Nieves Cough, MD;  Location: [facility];  Service: Urology;  Laterality: Right;    Current Outpatient Medications on File Prior to Visit  Medication Sig   amLODipine  (NORVASC ) 5 MG tablet Take 1 tablet (5 mg total) by mouth daily.   esomeprazole  (NEXIUM ) 20 MG capsule Take 1 capsule (20 mg total) by mouth every morning.   ibuprofen  (ADVIL ) 200 MG tablet Take 400 mg by mouth 3 (three) times daily as needed for headache (pain).   levothyroxine  (SYNTHROID ) 50 MCG tablet Take 1 tablet (50 mcg total) by mouth daily before breakfast.   apixaban  (ELIQUIS ) 5 MG TABS tablet TAKE 1 TABLET BY MOUTH TWICE A DAY (Patient not taking: Reported on 04/04/2023)   Current Facility-Administered Medications on File Prior to Visit  Medication   magnesium  sulfate 2 GM/50ML IVPB   palonosetron  (ALOXI ) 0.25 MG/5ML injection    Allergies  Allergen Reactions   Codeine Nausea And Vomiting and Rash     Social History   Socioeconomic History   Marital status: Married    Spouse name: Not on file   Number of children: Not on file   Years of education: Not on file   Highest education level: 12th grade  Occupational History   Not on file  Tobacco Use   Smoking status: Former    Types: Cigarettes    Passive exposure: Current   Smokeless tobacco: Never   Tobacco comments:    10-06-2020  per pt 1pp 7days  Vaping Use   Vaping status: Never Used  Substance and Sexual Activity   Alcohol use: Yes    Alcohol/week: 4.0 standard drinks of alcohol    Types: 4 Cans of beer per week   Drug use: Never   Sexual activity: Not on file  Other Topics  Concern   Not on file  Social History Narrative   Not on file   Social Drivers of Health   Financial Resource Strain: Medium Risk (03/31/2023)   Overall Financial Resource Strain (CARDIA)    Difficulty of Paying Living Expenses: Somewhat hard  Food Insecurity: Food Insecurity Present (04/04/2023)   Hunger Vital Sign    Worried About Running Out of Food in the Last Year: Never true    Ran Out of Food in the Last Year: Sometimes true  Transportation Needs: No Transportation Needs (03/31/2023)   PRAPARE - Administrator, Civil Service (Medical): No    Lack of Transportation (Non-Medical): No  Physical Activity: Insufficiently Active (03/31/2023)   Exercise Vital Sign    Days of Exercise per Week: 2 days    Minutes of Exercise per Session: 10 min  Stress: Stress Concern Present (03/31/2023)   [employer] of Occupational Health - Occupational Stress Questionnaire    Feeling of Stress : To some extent  Social Connections: Moderately Integrated (03/31/2023)   Social Connection and Isolation Panel [NHANES]    Frequency of Communication with Friends and Family: Twice a week    Frequency of Social Gatherings with Friends and Family: Once a week    Attends Religious Services: More than 4 times per year    Active Member of [employer] or Organizations: No    Attends Banker Meetings: Never    Marital Status: Married  Catering Manager Violence: Not At Risk (01/02/2023)   Humiliation, Afraid, Rape, and Kick questionnaire    Fear of Current or Ex-Partner: No    Emotionally Abused: No    Physically Abused: No    Sexually Abused: No   Social History   Tobacco Use  Smoking Status Former   Types: Cigarettes   Passive exposure: Current  Smokeless Tobacco Never  Tobacco Comments   10-06-2020  per pt 1pp 7days   Social History   Substance and Sexual Activity  Alcohol Use Yes   Alcohol/week: 4.0 standard drinks of alcohol   Types: 4 Cans of beer per week     Family  History  Problem Relation Age of Onset   Breast cancer Mother    Stroke Father    Breast cancer Sister    Cardiomyopathy Brother        Cardiomegaly     ROS: Denies fever, fatigue, unexplained weight loss/gain, CP, SHOB, and palpatitations. Denies neurological deficits, gastrointestinal and/or genitourinary complaints, and skin changes.   Objective:   Today's Vitals   04/04/23 1335  BP: 120/71  Pulse: 64  SpO2: 99%  Weight: 223 lb 14.4 oz (101.6 kg)  Height: 6' 2 (1.88 m)    GENERAL APPEARANCE: Well-appearing, in NAD. Well nourished.  SKIN: Pink, warm and dry. Turgor normal. No rash, lesion, ulceration, or ecchymoses. Hair evenly distributed.  HEENT: HEAD: Normocephalic.  EYES: PERRLA. EOMI. Lids intact w/o defect. Sclera white, Conjunctiva pink w/o exudate.  EARS: External ear w/o redness, swelling, masses or lesions. EAC clear. TM's intact, translucent w/o bulging, appropriate landmarks visualized. Appropriate acuity to conversational tones.  NOSE: Septum midline w/o deformity. Nares patent, mucosa pink and non-inflamed w/o drainage. No sinus tenderness.  THROAT: Uvula midline. Oropharynx clear. Tonsils non-inflamed w/o exudate. Oral mucosa pink and moist.  NECK: Supple, Trachea midline. Full ROM w/o pain or tenderness. No lymphadenopathy. Thyroid  non-tender w/o enlargement or palpable masses.  RESPIRATORY: Chest wall symmetrical w/o masses. Respirations even and non-labored. Breath sounds clear to auscultation bilaterally. No wheezes, rales, rhonchi, or crackles. CARDIAC: S1, S2 present, regular rate and rhythm. No gallops, murmurs, rubs, or clicks. PMI w/o lifts, heaves, or thrills. No carotid bruits. Capillary refill <2 seconds. Peripheral pulses 2+ bilaterally. GI: Abdomen w/ mild distention. Normoactive bowel sounds. No guarding or rebound tenderness.  GU: Pt deferred exam. MSK: Muscle tone and strength appropriate for age, w/o atrophy or abnormal movement. EXTREMITIES:  Active ROM intact, w/o tenderness, crepitus, or contracture. No obvious joint deformities or effusions. No clubbing, edema,  or cyanosis.  NEUROLOGIC: CN's II-XII intact. Motor strength symmetrical with no obvious weakness. No sensory deficits. DTR 2+ symmetric bilaterally. Steady, even gait.  PSYCH/MENTAL STATUS: Alert, oriented x 3. Cooperative, appropriate mood and affect.     Assessment & Plan:  1. Annual physical exam (Primary) Discussed preventative screenings, vaccinations and encouraged to receive Shingrix vaccine. Healthy lifestyle discussed. Pt UTD on screenings.   2. Snoring Possible OSA given daytime somnolence, fatigue, and snoring. Will refer to Pulmonology for sleep study.  - Ambulatory referral to Pulmonology  3. Elevated PSA Prostatic enlargement likely contributing. Will recheck PSA level w/ labs and discussed possible medication therapy to help with symptoms of BPH.  - PSA  5. Elevated cholesterol Previously elevated triglyceride level to assess control. Will obtain with labs today given comorbidity of impaired fasting glucose.  - Lipid panel   Orders Placed This Encounter  Procedures   Ambulatory referral to Pulmonology    Referral Priority:   Routine    Referral Type:   Consultation    Referral Reason:   Specialty Services Required    Requested Specialty:   Pulmonary Disease    Number of Visits Requested:   1    PATIENT COUNSELING: - Encouraged to adjust caloric intake to maintain or achieve ideal body weight, to reduce intake of dietary saturated fat and total fat, to limit sodium intake by avoiding high sodium foods and not adding table salt, and to maintain adequate dietary potassium and calcium preferably from fresh fruits, vegetables, and low-fat dairy products.   - Advised to avoid cigarette smoking. - Discussed with the patient that most people either abstain from alcohol or drink within safe limits (<=14/week and <=4 drinks/occasion for males, <=7/weeks  and <= 3 drinks/occasion for females) and that the risk for alcohol disorders and other health effects rises proportionally with the number of drinks per week and how often a drinker exceeds daily limits. - Discussed cessation/primary prevention of drug use and availability of treatment for abuse.   - Stressed the importance of regular exercise - Injury prevention: Discussed safety belts, safety helmets, smoke detector, smoking near bedding or upholstery.  - Dental health: Discussed importance of regular tooth brushing, flossing, and dental visits.  - Sexuality: Discussed sexually transmitted diseases, partner selection, use of condoms, avoidance of unintended pregnancy  and contraceptive alternatives.   NEXT PREVENTATIVE PHYSICAL DUE IN 1 YEAR.  Return in about 6 months (around 10/02/2023) for Follow up HTN, OSA, BPH.  Patient to reach out to office if new, worrisome, or unresolved symptoms arise or if no improvement in patient's condition. Patient verbalized understanding and is agreeable to treatment plan. All questions answered to patient's satisfaction.    Thersia Schuyler [name], [place]

## 2023-04-05 ENCOUNTER — Other Ambulatory Visit: Payer: Self-pay

## 2023-04-05 LAB — LIPID PANEL
Chol/HDL Ratio: 4.5 {ratio} (ref 0.0–5.0)
Cholesterol, Total: 156 mg/dL (ref 100–199)
HDL: 35 mg/dL — ABNORMAL LOW (ref >39)
LDL Chol Calc (NIH): 78 mg/dL (ref 0–99)
Triglycerides: 260 mg/dL — ABNORMAL HIGH (ref 0–149)
VLDL Cholesterol Cal: 43 mg/dL — ABNORMAL HIGH (ref 5–40)

## 2023-04-05 LAB — PSA: Prostate Specific Ag, Serum: 7.5 ng/mL — ABNORMAL HIGH (ref 0.0–4.0)

## 2023-04-06 ENCOUNTER — Inpatient Hospital Stay: Payer: 59

## 2023-04-06 ENCOUNTER — Other Ambulatory Visit: Payer: Self-pay | Admitting: Nurse Practitioner

## 2023-04-06 ENCOUNTER — Inpatient Hospital Stay (HOSPITAL_BASED_OUTPATIENT_CLINIC_OR_DEPARTMENT_OTHER): Payer: 59 | Admitting: Oncology

## 2023-04-06 ENCOUNTER — Inpatient Hospital Stay: Payer: 59 | Attending: Oncology

## 2023-04-06 VITALS — BP 121/84 | HR 54

## 2023-04-06 VITALS — BP 122/76 | HR 67 | Temp 98.2°F | Resp 18 | Ht 74.0 in | Wt 221.6 lb

## 2023-04-06 DIAGNOSIS — C221 Intrahepatic bile duct carcinoma: Secondary | ICD-10-CM

## 2023-04-06 DIAGNOSIS — N400 Enlarged prostate without lower urinary tract symptoms: Secondary | ICD-10-CM

## 2023-04-06 DIAGNOSIS — Z5112 Encounter for antineoplastic immunotherapy: Secondary | ICD-10-CM | POA: Insufficient documentation

## 2023-04-06 DIAGNOSIS — Z79620 Long term (current) use of immunosuppressive biologic: Secondary | ICD-10-CM | POA: Diagnosis not present

## 2023-04-06 LAB — CBC WITH DIFFERENTIAL (CANCER CENTER ONLY)
Abs Immature Granulocytes: 0.02 10*3/uL (ref 0.00–0.07)
Basophils Absolute: 0 10*3/uL (ref 0.0–0.1)
Basophils Relative: 1 %
Eosinophils Absolute: 0.3 10*3/uL (ref 0.0–0.5)
Eosinophils Relative: 5 %
HCT: 39.5 % (ref 39.0–52.0)
Hemoglobin: 13.8 g/dL (ref 13.0–17.0)
Immature Granulocytes: 0 %
Lymphocytes Relative: 31 %
Lymphs Abs: 2.1 10*3/uL (ref 0.7–4.0)
MCH: 31 pg (ref 26.0–34.0)
MCHC: 34.9 g/dL (ref 30.0–36.0)
MCV: 88.8 fL (ref 80.0–100.0)
Monocytes Absolute: 0.3 10*3/uL (ref 0.1–1.0)
Monocytes Relative: 4 %
Neutro Abs: 4.2 10*3/uL (ref 1.7–7.7)
Neutrophils Relative %: 59 %
Platelet Count: 164 10*3/uL (ref 150–400)
RBC: 4.45 MIL/uL (ref 4.22–5.81)
RDW: 13 % (ref 11.5–15.5)
WBC Count: 7 10*3/uL (ref 4.0–10.5)
nRBC: 0 % (ref 0.0–0.2)

## 2023-04-06 LAB — CMP (CANCER CENTER ONLY)
ALT: 21 U/L (ref 0–44)
AST: 22 U/L (ref 15–41)
Albumin: 4.3 g/dL (ref 3.5–5.0)
Alkaline Phosphatase: 82 U/L (ref 38–126)
Anion gap: 6 (ref 5–15)
BUN: 19 mg/dL (ref 6–20)
CO2: 26 mmol/L (ref 22–32)
Calcium: 8.9 mg/dL (ref 8.9–10.3)
Chloride: 105 mmol/L (ref 98–111)
Creatinine: 0.94 mg/dL (ref 0.61–1.24)
GFR, Estimated: >60 mL/min (ref >60)
Glucose, Bld: 110 mg/dL — ABNORMAL HIGH (ref 70–99)
Potassium: 3.9 mmol/L (ref 3.5–5.1)
Sodium: 137 mmol/L (ref 135–145)
Total Bilirubin: 0.7 mg/dL (ref 0.0–1.2)
Total Protein: 7 g/dL (ref 6.5–8.1)

## 2023-04-06 LAB — TSH: TSH: 67.267 u[IU]/mL — ABNORMAL HIGH (ref 0.350–4.500)

## 2023-04-06 MED ORDER — SODIUM CHLORIDE 0.9 % IV SOLN
Freq: Once | INTRAVENOUS | Status: AC
Start: 2023-04-06 — End: 2023-04-06

## 2023-04-06 MED ORDER — SODIUM CHLORIDE 0.9% FLUSH
10.0000 mL | INTRAVENOUS | Status: DC | PRN
  Administered 2023-04-06: 10 mL

## 2023-04-06 MED ORDER — DURVALUMAB 500 MG/10ML IV SOLN
1500.0000 mg | Freq: Once | INTRAVENOUS | Status: AC
  Administered 2023-04-06: 1500 mg via INTRAVENOUS
  Filled 2023-04-06: qty 30

## 2023-04-06 MED ORDER — HEPARIN SOD (PORK) LOCK FLUSH 100 UNIT/ML IV SOLN
500.0000 [IU] | Freq: Once | INTRAVENOUS | Status: AC | PRN
  Administered 2023-04-06: 500 [IU]

## 2023-04-06 NOTE — Patient Instructions (Signed)

## 2023-04-06 NOTE — Patient Instructions (Signed)
 [facility] - A DEPT OF [facility]   Discharge Instructions: Thank you for choosing [facility] to provide your oncology and hematology care.   If you have a lab appointment with the Cancer Center, please go directly to the Cancer Center and check in at the registration area.   Wear comfortable clothing and clothing appropriate for easy access to any Portacath or PICC line.   We strive to give you quality time with your provider. You may need to reschedule your appointment if you arrive late (15 or more minutes).  Arriving late affects you and other patients whose appointments are after yours.  Also, if you miss three or more appointments without notifying the office, you may be dismissed from the clinic at the provider's discretion.      For prescription refill requests, have your pharmacy contact our office and allow 72 hours for refills to be completed.    Today you received the following chemotherapy and/or immunotherapy agents IMFINZI      To help prevent nausea and vomiting after your treatment, we encourage you to take your nausea medication as directed.  BELOW ARE SYMPTOMS THAT SHOULD BE REPORTED IMMEDIATELY: *FEVER GREATER THAN 100.4 F (38 C) OR HIGHER *CHILLS OR SWEATING *NAUSEA AND VOMITING THAT IS NOT CONTROLLED WITH YOUR NAUSEA MEDICATION *UNUSUAL SHORTNESS OF BREATH *UNUSUAL BRUISING OR BLEEDING *URINARY PROBLEMS (pain or burning when urinating, or frequent urination) *BOWEL PROBLEMS (unusual diarrhea, constipation, pain near the anus) TENDERNESS IN MOUTH AND THROAT WITH OR WITHOUT PRESENCE OF ULCERS (sore throat, sores in mouth, or a toothache) UNUSUAL RASH, SWELLING OR PAIN  UNUSUAL VAGINAL DISCHARGE OR ITCHING   Items with * indicate a potential emergency and should be followed up as soon as possible or go to the Emergency Department if any problems should occur.  Please show the CHEMOTHERAPY ALERT CARD or IMMUNOTHERAPY  ALERT CARD at check-in to the Emergency Department and triage nurse.  Should you have questions after your visit or need to cancel or reschedule your appointment, please contact [facility] - A DEPT OF [facility]  Dept: [phone]  and follow the prompts.  Office hours are 8:00 a.m. to 4:30 p.m. Monday - Friday. Please note that voicemails left after 4:00 p.m. may not be returned until the following business day.  We are closed weekends and major holidays. You have access to a nurse at all times for urgent questions. Please call the main number to the clinic Dept: [phone] and follow the prompts.   For any non-urgent questions, you may also contact your provider using MyChart. We now offer e-Visits for anyone 81 and older to request care online for non-urgent symptoms. For details visit [email or web address].   Also download the MyChart app! Go to the app store, search "MyChart", open the app, select [place], and log in with your MyChart username and password.  Durvalumab Injection What is this medication? DURVALUMAB (dur VAL ue mab) treats some types of cancer. It works by helping your immune system slow or stop the spread of cancer cells. It is a monoclonal antibody. This medicine may be used for other purposes; ask your health care provider or pharmacist if you have questions. COMMON BRAND NAME(S): IMFINZI What should I tell my care team before I take this medication? They need to know if you have any of these conditions: Allogeneic stem cell transplant (uses someone else's stem cells) Autoimmune diseases, such as  Crohn disease, ulcerative colitis, lupus History of chest radiation Nervous system problems, such as Guillain-Barre syndrome, myasthenia gravis Organ transplant An unusual or allergic reaction to durvalumab, other medications, foods, dyes, or preservatives Pregnant or trying to get pregnant Breast-feeding How should I use this  medication? This medication is infused into a vein. It is given by your care team in a hospital or clinic setting. A special MedGuide will be given to you before each treatment. Be sure to read this information carefully each time. Talk to your care team about the use of this medication in children. Special care may be needed. Overdosage: If you think you have taken too much of this medicine contact a poison control center or emergency room at once. NOTE: This medicine is only for you. Do not share this medicine with others. What if I miss a dose? Keep appointments for follow-up doses. It is important not to miss your dose. Call your care team if you are unable to keep an appointment. What may interact with this medication? Interactions have not been studied. This list may not describe all possible interactions. Give your health care provider a list of all the medicines, herbs, non-prescription drugs, or dietary supplements you use. Also tell them if you smoke, drink alcohol, or use illegal drugs. Some items may interact with your medicine. What should I watch for while using this medication? Your condition will be monitored carefully while you are receiving this medication. You may need blood work while taking this medication. This medication may cause serious skin reactions. They can happen weeks to months after starting the medication. Contact your care team right away if you notice fevers or flu-like symptoms with a rash. The rash may be red or purple and then turn into blisters or peeling of the skin. You may also notice a red rash with swelling of the face, lips, or lymph nodes in your neck or under your arms. Tell your care team right away if you have any change in your eyesight. Talk to your care team if you may be pregnant. Serious birth defects can occur if you take this medication during pregnancy and for 3 months after the last dose. You will need a negative pregnancy test before starting  this medication. Contraception is recommended while taking this medication and for 3 months after the last dose. Your care team can help you find the option that works for you. Do not breastfeed while taking this medication and for 3 months after the last dose. What side effects may I notice from receiving this medication? Side effects that you should report to your care team as soon as possible: Allergic reactions--skin rash, itching, hives, swelling of the face, lips, tongue, or throat Dry cough, shortness of breath or trouble breathing Eye pain, redness, irritation, or discharge with blurry or decreased vision Heart muscle inflammation--unusual weakness or fatigue, shortness of breath, chest pain, fast or irregular heartbeat, dizziness, swelling of the ankles, feet, or hands Hormone gland problems--headache, sensitivity to light, unusual weakness or fatigue, dizziness, fast or irregular heartbeat, increased sensitivity to cold or heat, excessive sweating, constipation, hair loss, increased thirst or amount of urine, tremors or shaking, irritability Infusion reactions--chest pain, shortness of breath or trouble breathing, feeling faint or lightheaded Kidney injury (glomerulonephritis)--decrease in the amount of urine, red or dark brown urine, foamy or bubbly urine, swelling of the ankles, hands, or feet Liver injury--right upper belly pain, loss of appetite, nausea, light-colored stool, dark yellow or brown urine, yellowing  skin or eyes, unusual weakness or fatigue Pain, tingling, or numbness in the hands or feet, muscle weakness, change in vision, confusion or trouble speaking, loss of balance or coordination, trouble walking, seizures Rash, fever, and swollen lymph nodes Redness, blistering, peeling, or loosening of the skin, including inside the mouth Sudden or severe stomach pain, bloody diarrhea, fever, nausea, vomiting Side effects that usually do not require medical attention (report these  to your care team if they continue or are bothersome): Bone, joint, or muscle pain Diarrhea Fatigue Loss of appetite Nausea Skin rash This list may not describe all possible side effects. Call your doctor for medical advice about side effects. You may report side effects to FDA at 1-800-FDA-1088. Where should I keep my medication? This medication is given in a hospital or clinic. It will not be stored at home. NOTE: This sheet is a summary. It may not cover all possible information. If you have questions about this medicine, talk to your doctor, pharmacist, or health care provider.  2024 Elsevier/Gold Standard (2021-06-29 00:00:00)

## 2023-04-06 NOTE — Progress Notes (Signed)
 [facility] OFFICE PROGRESS NOTE   Diagnosis: Cholangiocarcinoma  INTERVAL HISTORY:   [name] returns as scheduled.  He continues Durvalumab .  No diarrhea or rash.  He reports dry skin and pruritus over the trunk and legs.  No abdominal pain.  He has Urrea.  The PSA was elevated and he saw his primary provider this week.  Objective:  Vital signs in last 24 hours:  Blood pressure 122/76, pulse 67, temperature 98.2 F (36.8 C), temperature source Temporal, resp. rate 18, height 6' 2 (1.88 m), weight 221 lb 9.6 oz (100.5 kg), SpO2 99%.  Resp: Lungs clear bilaterally Cardio: Regular rate and rhythm GI: No hepatosplenomegaly, nontender, no mass Vascular: No leg edema  Skin: Dryness over the trunk, faint rash at the low anterior lateral chest?  Portacath/PICC-without erythema  Lab Results:  Lab Results  Component Value Date   WBC 7.0 04/06/2023   HGB 13.8 04/06/2023   HCT 39.5 04/06/2023   MCV 88.8 04/06/2023   PLT 164 04/06/2023   NEUTROABS 4.2 04/06/2023    CMP  Lab Results  Component Value Date   NA 137 04/06/2023   K 3.9 04/06/2023   CL 105 04/06/2023   CO2 26 04/06/2023   GLUCOSE 110 (H) 04/06/2023   BUN 19 04/06/2023   CREATININE 0.94 04/06/2023   CALCIUM 8.9 04/06/2023   PROT 7.0 04/06/2023   ALBUMIN 4.3 04/06/2023   AST 22 04/06/2023   ALT 21 04/06/2023   ALKPHOS 82 04/06/2023   BILITOT 0.7 04/06/2023   GFRNONAA >60 04/06/2023    Lab Results  Component Value Date   RJW800 12 01/10/2023     Medications: I have reviewed the patient's current medications.   Assessment/Plan: Liver mass concerning for malignancy -CT abdomen/pelvis with contrast 08/14/1998 23-2.9 x 2.7 x 2.7 cm hypoenhancing mass in segment 5 near the liver hilum just above hepatic ductal confluence suspicious for primary bile duct malignancy or primary hepatic malignancy -MRCP 08/14/2021-irregular mass of segment 8 of the liver with perilesional enhancement concerning  for cholangiocarcinoma -08/14/2021 CA 19.9 was elevated at 76 -08/16/2021 AFP 6.4 -08/16/2021 cytology from bile duct brushing showed atypical cells -08/19/2021 cytology from bile duct brushing showed cells suspicious for malignancy -08/25/2021 CT-guided liver biopsy-moderately differentiated adenocarcinoma, CK7 positive, CDX2 positive in a small population of cells, TTF-1 negative, CK20 negative-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 -CTs at [facility] 09/06/2021-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, occlusion of the anterior branch of the right portal vein, periportal enhancement and thickening on delayed imaging potentially due to tumor infiltration, no change in the 2 x 3.2 cm mass in segment 8, mild to moderate left and right intrahepatic biliary dilatation, biliary stent in place, small portacaval and periportal nodes, no evidence of metastatic disease in the chest -Cycle 1 gemcitabine /cisplatin /durvalumab  10/01/2021 -Cycle 2 gemcitabine /cisplatin /durvalumab  10/22/2021 -Cycle 3 gemcitabine /cisplatin /durvalumab  11/11/2021, D1 cisplatin  held due to neuropathy, cisplatin  resumed day 8 -Cycle 4 gemcitabine /cisplatin /durvalumab  12/03/2021 -MRI abdomen 12/15/2021-segment 8 cholangiocarcinoma no longer measurable, residual biliary duct dilation in the anterior right and left hepatic lobes, no evidence of metastatic disease persistent anterior right and new peripheral left portal vein occlusion -Cycle 5 gemcitabine /cisplatin /durvalumab  12/24/2021 -Cycle 6 gemcitabine /cisplatin /durvalumab  01/13/2022 -CTs at 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  Partial hepatectomy anatomy.  Marked improvement postsurgical fluid collections.  Biliary stent in place.  Mild biliary duct dilatation in the central hepatic lobes.  No evidence of metastatic disease in the abdomen/pelvis. -07/27/2022 continue every 4-week Durvalumab  -CTs 10/18/2022-partial left hepatectomy, common hepatic and bile duct stent unchanged with unchanged segmental intrahepatic biliary duct dilation in the remnant left lobe and anterior right lobe.  No evidence of metastatic disease in the chest, abdomen, or pelvis.  Unchanged 0.5 cm subpleural nodule in the right middle lobe -10/19/2022 continue every 4-week Durvalumab  -CTs 02/02/2023-no evidence of disease progression, surgical treatments at the anterior margin of the right liver, left hepatectomy, stable right middle lobe nodule -02/09/2023-every 4-week Durvalumab  continued 2.  Obstructive jaundice -08/16/2021 percutaneous biliary drain placement 3.  Hepatic steatosis 4.  GERD 5.  Hypertension 6.  Normocytic anemia 7.  Enlarged prostate seen on CT 8.  Tobacco use 9.  Enterococcus and Aerococcus bacteremia 08/20/2021-discharged 08/26/2021 to complete outpatient course of Augmentin  10.  Peripheral neuropathy 11.  Port-A-Cath placement 09/23/2021 12.  Biliary  drain exchange 09/23/2021; drain capped 10/12/2021 13.  Rigors following biliary drain procedure 09/23/2021, biliary drain culture-Enterobacter Cloacae, Bactrim  x7 days 09/27/2021 14.  Right leg edema and pain 10/18/2021-Doppler negative for DVT 15.  Admission 03/18/2022 with a right upper quadrant abscess, status post drain placement, culture positive for Klebsiella CTs 03/21/2022-no change in moderate volume ascites, interval decrease in subhepatic air-filled collection with catheter in appropriate position, resolved right pneumothorax, no change in bilateral pleural effusions-moderate on right and small on left CTs 04/25/2022 at [facility]-3.1 x 2.0 cm fluid collection in the abdomen and pelvis, decreased in volume and increased in organization; resolution of subhepatic collection; increased partially visualized large right pleural effusion with near complete collapse of the right lower lobe. Drainage catheter removed 04/25/2022 16.  Large right pleural effusion on CT 04/25/2022-thoracentesis 04/29/2022, 1.75 L of pleural fluid removed; culture negative, cytology with reactive mesothelial cells present, acute inflammation 17.  Hypothyroidism, likely secondary to Durvalumab , thyroid  hormone replacement beginning November 2024     Disposition: [name] appears stable.  He is tolerating the durvalumab  well and there is no clinical evidence for progression of the cholangiocarcinoma.  He will be scheduled for CTs at a 68-month interval.  He will complete another treatment with durvalumab  today.  We will check a PSA when he returns next month.  I will refer him to urology if the PSA remains elevated.  We will check the CA 19-9 when he returns next month.  Will follow-up on the TSH and T4 from today.  Arley Hof, MD  04/06/2023  10:11 AM

## 2023-04-06 NOTE — Progress Notes (Signed)
 Patient seen by Dr. Arley Hof today  Vitals are within treatment parameters:Yes   Labs are within treatment parameters: Yes   Treatment plan has been signed: Yes   Per physician team, Patient is ready for treatment and there are NO modifications to the treatment plan.

## 2023-04-07 ENCOUNTER — Encounter (HOSPITAL_BASED_OUTPATIENT_CLINIC_OR_DEPARTMENT_OTHER): Payer: Self-pay | Admitting: Family Medicine

## 2023-04-07 LAB — T4: T4, Total: 5.1 ug/dL (ref 4.5–12.0)

## 2023-04-07 NOTE — Progress Notes (Signed)
 Hi Jeffrey Blevins, Your total cholesterol is normal, but your triglycerides have significantly increased from when it was checked 3 years ago.  Your triglycerides back then were also elevated.  At this time I would recommend doing a omega-3 supplement of 1000 mg daily to help lower this.  Heart healthy diet and regular exercise can also help to get this down and then I would recommend we recheck in the couple months.  Your PSA did elevate by several points from the last time we checked it.  Given your symptoms and history of cholangiocarcinoma, I did send your PSA results to Dr. Deanne for him to look over.  I believe that we may need to get you into urology for further evaluation.  I will wait to hear what Dr. Deanne says as well.  If you are agreeable to proceeding with seeing urology, please let me know

## 2023-04-10 ENCOUNTER — Other Ambulatory Visit (HOSPITAL_BASED_OUTPATIENT_CLINIC_OR_DEPARTMENT_OTHER): Payer: Self-pay | Admitting: Family Medicine

## 2023-04-10 DIAGNOSIS — R972 Elevated prostate specific antigen [PSA]: Secondary | ICD-10-CM

## 2023-04-12 ENCOUNTER — Encounter: Payer: Self-pay | Admitting: Oncology

## 2023-04-15 ENCOUNTER — Other Ambulatory Visit: Payer: Self-pay | Admitting: Nurse Practitioner

## 2023-04-15 DIAGNOSIS — C221 Intrahepatic bile duct carcinoma: Secondary | ICD-10-CM

## 2023-04-15 DIAGNOSIS — E032 Hypothyroidism due to medicaments and other exogenous substances: Secondary | ICD-10-CM

## 2023-04-17 ENCOUNTER — Telehealth: Payer: Self-pay

## 2023-04-17 ENCOUNTER — Encounter: Payer: Self-pay | Admitting: Oncology

## 2023-04-17 NOTE — Telephone Encounter (Signed)
Notified pt to let him know that a release of information form is needing to be signed. Pt was able to give me his email address for the document. Let the pt know I was able to Faxed the remaining  Long term disability forms to his [employer]. Pt verbalized understanding.

## 2023-04-20 ENCOUNTER — Encounter: Payer: Self-pay | Admitting: *Deleted

## 2023-04-20 NOTE — Telephone Encounter (Signed)
Arby Barrette Maneri returned call to confirm "receipt of completed authorization.  I typed my name, dated it.  Would not let me sign my name."   Confirmed receipt.  This nurse will send to (SW) H.I.M. No receipt of [employer] request noted in OnBase.

## 2023-04-20 NOTE — Telephone Encounter (Signed)
Second request for medical records for [employer] for L[employer] received today.  Per H.I.M. releases this request is on hold per [facility].  No H.I.P.A.A. Authorization received from Bjorn Pippin, secondly this request is not addressed to our legal name "[facility]" for (SW) H.I.M. office to comply.  Connected with [employer] [phone]) to resend request addressed to "[facility]" and fax to [phone].   Answered question to expect up to a thirty-day turnover per Health System policy   This nurse sent H.I.P.A.A authorization attached to MyChart portal, DocuSign and left voicemail for FREDDRICK GLADSON ([phone]) with above information.

## 2023-04-26 ENCOUNTER — Ambulatory Visit: Payer: 59 | Admitting: Urology

## 2023-04-26 ENCOUNTER — Encounter: Payer: Self-pay | Admitting: Urology

## 2023-04-26 VITALS — BP 139/98 | HR 92

## 2023-04-26 DIAGNOSIS — R972 Elevated prostate specific antigen [PSA]: Secondary | ICD-10-CM

## 2023-04-26 LAB — URINALYSIS, ROUTINE W REFLEX MICROSCOPIC
Bilirubin, UA: NEGATIVE
Glucose, UA: NEGATIVE
Ketones, UA: NEGATIVE
Leukocytes,UA: NEGATIVE
Nitrite, UA: NEGATIVE
Protein,UA: NEGATIVE
RBC, UA: NEGATIVE
Specific Gravity, UA: 1.025 (ref 1.005–1.030)
Urobilinogen, Ur: 1 mg/dL (ref 0.2–1.0)
pH, UA: 6 (ref 5.0–7.5)

## 2023-04-26 MED ORDER — TADALAFIL 20 MG PO TABS: 20.0000 mg | ORAL_TABLET | Freq: Every evening | ORAL | 5 refills | Status: DC | PRN

## 2023-04-26 NOTE — Patient Instructions (Signed)

## 2023-04-26 NOTE — Progress Notes (Signed)
 04/26/2023 2:37 PM   TRAFTON ROKER 12-Jun-1965 [id number]  Referring provider: Hilbert Bible, FNP [address] [place],  [place] [address]  Elevated PSA    HPI: [name] is a 58yo here for evaluation of elevated PSA. PSA was 4.2 three months and 7.5 in the past month. He has a hx of cholangiocarcinoma and is currently on immunotherapy. No family history of prostate cancer. IPSS 4 QOL 1 on no BPH therapy. Uirne stream strong. He has nocturia 1-2x.  For the past 3 years he has noted difficulty getting and maintaining an erection. He has dorsal curvature with erections   PMH: Past Medical History:  Diagnosis Date   Basal cell carcinoma 11/10/2022   rigght preauricular area  needs mohs   Cholangiocarcinoma (HCC)    Dx 07/2021.  Followed by Dr. Truett Perna, Dr. Modesta Messing at [facility]   GERD (gastroesophageal reflux disease)    Hiatal hernia    History of Barrett's esophagus    per pt dx yrs ago, but with last egd none noted 05/ 2021   History of esophageal dilatation    per pt hx several times last one approx. 2009 then 05/ 2021  for stricture   Hypertension    followed by pcp   Raynauds phenomenon    Right hydrocele     Surgical History: Past Surgical History:  Procedure Laterality Date   COLONOSCOPY WITH ESOPHAGOGASTRODUODENOSCOPY (EGD)  07/16/2019   last one   INGUINAL HERNIA REPAIR Bilateral    last one 1990s   IR CHOLANGIOGRAM EXISTING TUBE  10/12/2021   IR ENDOLUMINAL BX OF BILIARY TREE  08/19/2021   IR EXCHANGE BILIARY DRAIN  08/19/2021   IR EXCHANGE BILIARY DRAIN  09/23/2021   IR EXCHANGE BILIARY DRAIN  11/04/2021   IR IMAGING GUIDED PORT INSERTION  09/23/2021   IR INT EXT BILIARY DRAIN WITH CHOLANGIOGRAM  08/16/2021   SPERMATOCELECTOMY Right 10/09/2020   Procedure: SPERMATOCELECTOMY;  Surgeon: Jerilee Field, MD;  Location: [facility];  Service: Urology;  Laterality: Right;    Home Medications:  Allergies as of 04/26/2023        Reactions   Codeine Nausea And Vomiting, Rash        Medication List        Accurate as of April 26, 2023  2:37 PM. If you have any questions, ask your nurse or doctor.          amLODipine 5 MG tablet Commonly known as: Norvasc Take 1 tablet (5 mg total) by mouth daily.   Eliquis 5 MG Tabs tablet Generic drug: apixaban TAKE 1 TABLET BY MOUTH TWICE A DAY   esomeprazole 20 MG capsule Commonly known as: NEXIUM TAKE 1 CAPSULE BY MOUTH EVERY MORNING   ibuprofen 200 MG tablet Commonly known as: ADVIL Take 400 mg by mouth 3 (three) times daily as needed for headache (pain).   levothyroxine 50 MCG tablet Commonly known as: SYNTHROID TAKE 1 TABLET BY MOUTH DAILY BEFORE BREAKFAST        Allergies:  Allergies  Allergen Reactions   Codeine Nausea And Vomiting and Rash    Family History: Family History  Problem Relation Age of Onset   Breast cancer Mother    Stroke Father    Breast cancer Sister    Cardiomyopathy Brother        Cardiomegaly    Social History:  reports that he has quit smoking. His smoking use included cigarettes. He has been exposed to tobacco  smoke. He has never used smokeless tobacco. He reports current alcohol use of about 4.0 standard drinks of alcohol per week. He reports that he does not use drugs.  ROS: All other review of systems were reviewed and are negative except what is noted above in HPI  Physical Exam: There were no vitals taken for this visit.  Constitutional:  Alert and oriented, No acute distress. HEENT: [place] AT, moist mucus membranes.  Trachea midline, no masses. Cardiovascular: No clubbing, cyanosis, or edema. Respiratory: Normal respiratory effort, no increased work of breathing. GI: Abdomen is soft, nontender, nondistended, no abdominal masses GU: No CVA tenderness. Circumcised phallus. No masses/lesions on penis, testis, scrotum. Prostate 60g smooth no nodules no induration.  Lymph: No cervical or inguinal  lymphadenopathy. Skin: No rashes, bruises or suspicious lesions. Neurologic: Grossly intact, no focal deficits, moving all 4 extremities. Psychiatric: Normal mood and affect.  Laboratory Data: Lab Results  Component Value Date   WBC 7.0 04/06/2023   HGB 13.8 04/06/2023   HCT 39.5 04/06/2023   MCV 88.8 04/06/2023   PLT 164 04/06/2023    Lab Results  Component Value Date   CREATININE 0.94 04/06/2023    No results found for: "PSA"  No results found for: "TESTOSTERONE"  Lab Results  Component Value Date   HGBA1C 5.5 01/10/2023    Urinalysis    Component Value Date/Time   COLORURINE AMBER (A) 08/20/2021 0729   APPEARANCEUR CLEAR 08/20/2021 0729   LABSPEC 1.021 08/20/2021 0729   PHURINE 5.0 08/20/2021 0729   GLUCOSEU NEGATIVE 08/20/2021 0729   HGBUR SMALL (A) 08/20/2021 0729   BILIRUBINUR SMALL (A) 08/20/2021 0729   KETONESUR NEGATIVE 08/20/2021 0729   PROTEINUR 30 (A) 08/20/2021 0729   NITRITE NEGATIVE 08/20/2021 0729   LEUKOCYTESUR NEGATIVE 08/20/2021 0729    Lab Results  Component Value Date   BACTERIA RARE (A) 08/20/2021    Pertinent Imaging:  No results found for this or any previous visit.  No results found for this or any previous visit.  No results found for this or any previous visit.  No results found for this or any previous visit.  No results found for this or any previous visit.  No results found for this or any previous visit.  No results found for this or any previous visit.  No results found for this or any previous visit.   Assessment & Plan:    1. Elevated PSA (Primary) IsoPSA, will call with results. If his IsoPSA is elevated we will proceed with prostate biopsy - Urinalysis, Routine w reflex microscopic   No follow-ups on file.  Wilkie Aye, MD  [facility] [place]

## 2023-04-28 NOTE — Telephone Encounter (Signed)
 Today this nurse Checked status of ROI.  (SW) H.I.M. on 04/21/2023 letter to [facility].  "Unable to comply with records request addressed to provider." Notified Jeffrey Blevins, [phone] (home) of status.

## 2023-05-02 ENCOUNTER — Encounter: Payer: Self-pay | Admitting: Urology

## 2023-05-04 ENCOUNTER — Encounter: Payer: Self-pay | Admitting: Nurse Practitioner

## 2023-05-04 ENCOUNTER — Inpatient Hospital Stay: Payer: Managed Care, Other (non HMO)

## 2023-05-04 ENCOUNTER — Inpatient Hospital Stay: Payer: Managed Care, Other (non HMO) | Attending: Oncology

## 2023-05-04 ENCOUNTER — Inpatient Hospital Stay: Payer: Managed Care, Other (non HMO) | Admitting: Nurse Practitioner

## 2023-05-04 VITALS — BP 128/86 | HR 64 | Temp 98.6°F | Resp 18 | Ht 74.0 in | Wt 221.3 lb

## 2023-05-04 VITALS — BP 124/79 | HR 62 | Temp 97.9°F

## 2023-05-04 DIAGNOSIS — C221 Intrahepatic bile duct carcinoma: Secondary | ICD-10-CM | POA: Diagnosis not present

## 2023-05-04 DIAGNOSIS — Z5112 Encounter for antineoplastic immunotherapy: Secondary | ICD-10-CM | POA: Diagnosis not present

## 2023-05-04 DIAGNOSIS — N400 Enlarged prostate without lower urinary tract symptoms: Secondary | ICD-10-CM

## 2023-05-04 DIAGNOSIS — Z79620 Long term (current) use of immunosuppressive biologic: Secondary | ICD-10-CM | POA: Diagnosis not present

## 2023-05-04 DIAGNOSIS — E032 Hypothyroidism due to medicaments and other exogenous substances: Secondary | ICD-10-CM | POA: Diagnosis not present

## 2023-05-04 LAB — CBC WITH DIFFERENTIAL (CANCER CENTER ONLY)
Abs Immature Granulocytes: 0.02 10*3/uL (ref 0.00–0.07)
Basophils Absolute: 0 10*3/uL (ref 0.0–0.1)
Basophils Relative: 1 %
Eosinophils Absolute: 0.3 10*3/uL (ref 0.0–0.5)
Eosinophils Relative: 4 %
HCT: 39.7 % (ref 39.0–52.0)
Hemoglobin: 13.7 g/dL (ref 13.0–17.0)
Immature Granulocytes: 0 %
Lymphocytes Relative: 33 %
Lymphs Abs: 2.2 10*3/uL (ref 0.7–4.0)
MCH: 30.2 pg (ref 26.0–34.0)
MCHC: 34.5 g/dL (ref 30.0–36.0)
MCV: 87.4 fL (ref 80.0–100.0)
Monocytes Absolute: 0.4 10*3/uL (ref 0.1–1.0)
Monocytes Relative: 5 %
Neutro Abs: 3.9 10*3/uL (ref 1.7–7.7)
Neutrophils Relative %: 57 %
Platelet Count: 194 10*3/uL (ref 150–400)
RBC: 4.54 MIL/uL (ref 4.22–5.81)
RDW: 12.5 % (ref 11.5–15.5)
WBC Count: 6.8 10*3/uL (ref 4.0–10.5)
nRBC: 0 % (ref 0.0–0.2)

## 2023-05-04 LAB — CMP (CANCER CENTER ONLY)
ALT: 16 U/L (ref 0–44)
AST: 19 U/L (ref 15–41)
Albumin: 4.5 g/dL (ref 3.5–5.0)
Alkaline Phosphatase: 84 U/L (ref 38–126)
Anion gap: 8 (ref 5–15)
BUN: 21 mg/dL — ABNORMAL HIGH (ref 6–20)
CO2: 25 mmol/L (ref 22–32)
Calcium: 8.8 mg/dL — ABNORMAL LOW (ref 8.9–10.3)
Chloride: 106 mmol/L (ref 98–111)
Creatinine: 0.99 mg/dL (ref 0.61–1.24)
GFR, Estimated: >60 mL/min (ref >60)
Glucose, Bld: 88 mg/dL (ref 70–99)
Potassium: 4.4 mmol/L (ref 3.5–5.1)
Sodium: 139 mmol/L (ref 135–145)
Total Bilirubin: 0.5 mg/dL (ref 0.0–1.2)
Total Protein: 7.2 g/dL (ref 6.5–8.1)

## 2023-05-04 LAB — TSH: TSH: 32.293 u[IU]/mL — ABNORMAL HIGH (ref 0.350–4.500)

## 2023-05-04 MED ORDER — SODIUM CHLORIDE 0.9% FLUSH
10.0000 mL | INTRAVENOUS | Status: DC | PRN
  Administered 2023-05-04: 10 mL

## 2023-05-04 MED ORDER — LEVOTHYROXINE SODIUM 50 MCG PO TABS: 50.0000 ug | ORAL_TABLET | Freq: Every day | ORAL | 0 refills | Status: DC

## 2023-05-04 MED ORDER — HEPARIN SOD (PORK) LOCK FLUSH 100 UNIT/ML IV SOLN
500.0000 [IU] | Freq: Once | INTRAVENOUS | Status: AC | PRN
  Administered 2023-05-04: 500 [IU]

## 2023-05-04 MED ORDER — DURVALUMAB 500 MG/10ML IV SOLN
1500.0000 mg | Freq: Once | INTRAVENOUS | Status: AC
  Administered 2023-05-04: 1500 mg via INTRAVENOUS
  Filled 2023-05-04: qty 30

## 2023-05-04 MED ORDER — SODIUM CHLORIDE 0.9 % IV SOLN: Freq: Once | INTRAVENOUS | Status: AC

## 2023-05-04 NOTE — Patient Instructions (Signed)
 [facility] - A DEPT OF [facility]   Discharge Instructions: Thank you for choosing [facility] to provide your oncology and hematology care.   If you have a lab appointment with the Cancer Center, please go directly to the Cancer Center and check in at the registration area.   Wear comfortable clothing and clothing appropriate for easy access to any Portacath or PICC line.   We strive to give you quality time with your provider. You may need to reschedule your appointment if you arrive late (15 or more minutes).  Arriving late affects you and other patients whose appointments are after yours.  Also, if you miss three or more appointments without notifying the office, you may be dismissed from the clinic at the provider's discretion.      For prescription refill requests, have your pharmacy contact our office and allow 72 hours for refills to be completed.    Today you received the following chemotherapy and/or immunotherapy agents IMFINZI      To help prevent nausea and vomiting after your treatment, we encourage you to take your nausea medication as directed.  BELOW ARE SYMPTOMS THAT SHOULD BE REPORTED IMMEDIATELY: *FEVER GREATER THAN 100.4 F (38 C) OR HIGHER *CHILLS OR SWEATING *NAUSEA AND VOMITING THAT IS NOT CONTROLLED WITH YOUR NAUSEA MEDICATION *UNUSUAL SHORTNESS OF BREATH *UNUSUAL BRUISING OR BLEEDING *URINARY PROBLEMS (pain or burning when urinating, or frequent urination) *BOWEL PROBLEMS (unusual diarrhea, constipation, pain near the anus) TENDERNESS IN MOUTH AND THROAT WITH OR WITHOUT PRESENCE OF ULCERS (sore throat, sores in mouth, or a toothache) UNUSUAL RASH, SWELLING OR PAIN  UNUSUAL VAGINAL DISCHARGE OR ITCHING   Items with * indicate a potential emergency and should be followed up as soon as possible or go to the Emergency Department if any problems should occur.  Please show the CHEMOTHERAPY ALERT CARD or IMMUNOTHERAPY  ALERT CARD at check-in to the Emergency Department and triage nurse.  Should you have questions after your visit or need to cancel or reschedule your appointment, please contact [facility] - A DEPT OF [facility]  Dept: [phone]  and follow the prompts.  Office hours are 8:00 a.m. to 4:30 p.m. Monday - Friday. Please note that voicemails left after 4:00 p.m. may not be returned until the following business day.  We are closed weekends and major holidays. You have access to a nurse at all times for urgent questions. Please call the main number to the clinic Dept: [phone] and follow the prompts.   For any non-urgent questions, you may also contact your provider using MyChart. We now offer e-Visits for anyone 81 and older to request care online for non-urgent symptoms. For details visit [email or web address].   Also download the MyChart app! Go to the app store, search "MyChart", open the app, select [place], and log in with your MyChart username and password.  Durvalumab Injection What is this medication? DURVALUMAB (dur VAL ue mab) treats some types of cancer. It works by helping your immune system slow or stop the spread of cancer cells. It is a monoclonal antibody. This medicine may be used for other purposes; ask your health care provider or pharmacist if you have questions. COMMON BRAND NAME(S): IMFINZI What should I tell my care team before I take this medication? They need to know if you have any of these conditions: Allogeneic stem cell transplant (uses someone else's stem cells) Autoimmune diseases, such as  Crohn disease, ulcerative colitis, lupus History of chest radiation Nervous system problems, such as Guillain-Barre syndrome, myasthenia gravis Organ transplant An unusual or allergic reaction to durvalumab, other medications, foods, dyes, or preservatives Pregnant or trying to get pregnant Breast-feeding How should I use this  medication? This medication is infused into a vein. It is given by your care team in a hospital or clinic setting. A special MedGuide will be given to you before each treatment. Be sure to read this information carefully each time. Talk to your care team about the use of this medication in children. Special care may be needed. Overdosage: If you think you have taken too much of this medicine contact a poison control center or emergency room at once. NOTE: This medicine is only for you. Do not share this medicine with others. What if I miss a dose? Keep appointments for follow-up doses. It is important not to miss your dose. Call your care team if you are unable to keep an appointment. What may interact with this medication? Interactions have not been studied. This list may not describe all possible interactions. Give your health care provider a list of all the medicines, herbs, non-prescription drugs, or dietary supplements you use. Also tell them if you smoke, drink alcohol, or use illegal drugs. Some items may interact with your medicine. What should I watch for while using this medication? Your condition will be monitored carefully while you are receiving this medication. You may need blood work while taking this medication. This medication may cause serious skin reactions. They can happen weeks to months after starting the medication. Contact your care team right away if you notice fevers or flu-like symptoms with a rash. The rash may be red or purple and then turn into blisters or peeling of the skin. You may also notice a red rash with swelling of the face, lips, or lymph nodes in your neck or under your arms. Tell your care team right away if you have any change in your eyesight. Talk to your care team if you may be pregnant. Serious birth defects can occur if you take this medication during pregnancy and for 3 months after the last dose. You will need a negative pregnancy test before starting  this medication. Contraception is recommended while taking this medication and for 3 months after the last dose. Your care team can help you find the option that works for you. Do not breastfeed while taking this medication and for 3 months after the last dose. What side effects may I notice from receiving this medication? Side effects that you should report to your care team as soon as possible: Allergic reactions--skin rash, itching, hives, swelling of the face, lips, tongue, or throat Dry cough, shortness of breath or trouble breathing Eye pain, redness, irritation, or discharge with blurry or decreased vision Heart muscle inflammation--unusual weakness or fatigue, shortness of breath, chest pain, fast or irregular heartbeat, dizziness, swelling of the ankles, feet, or hands Hormone gland problems--headache, sensitivity to light, unusual weakness or fatigue, dizziness, fast or irregular heartbeat, increased sensitivity to cold or heat, excessive sweating, constipation, hair loss, increased thirst or amount of urine, tremors or shaking, irritability Infusion reactions--chest pain, shortness of breath or trouble breathing, feeling faint or lightheaded Kidney injury (glomerulonephritis)--decrease in the amount of urine, red or dark brown urine, foamy or bubbly urine, swelling of the ankles, hands, or feet Liver injury--right upper belly pain, loss of appetite, nausea, light-colored stool, dark yellow or brown urine, yellowing  skin or eyes, unusual weakness or fatigue Pain, tingling, or numbness in the hands or feet, muscle weakness, change in vision, confusion or trouble speaking, loss of balance or coordination, trouble walking, seizures Rash, fever, and swollen lymph nodes Redness, blistering, peeling, or loosening of the skin, including inside the mouth Sudden or severe stomach pain, bloody diarrhea, fever, nausea, vomiting Side effects that usually do not require medical attention (report these  to your care team if they continue or are bothersome): Bone, joint, or muscle pain Diarrhea Fatigue Loss of appetite Nausea Skin rash This list may not describe all possible side effects. Call your doctor for medical advice about side effects. You may report side effects to FDA at 1-800-FDA-1088. Where should I keep my medication? This medication is given in a hospital or clinic. It will not be stored at home. NOTE: This sheet is a summary. It may not cover all possible information. If you have questions about this medicine, talk to your doctor, pharmacist, or health care provider.  2024 Elsevier/Gold Standard (2021-06-29 00:00:00)

## 2023-05-04 NOTE — Progress Notes (Signed)
 Patient seen by Lonna Cobb NP today  Vitals are within treatment parameters:Yes   Labs are within treatment parameters: Yes   Treatment plan has been signed: Yes   Per physician team, Patient is ready for treatment and there are NO modifications to the treatment plan.

## 2023-05-04 NOTE — Progress Notes (Signed)
 Patients port flushed without difficulty.  Good blood return noted with no bruising or swelling noted at site.  Patient remains accessed for treatment.

## 2023-05-04 NOTE — Progress Notes (Signed)
 [facility] OFFICE PROGRESS NOTE   Diagnosis: Cholangiocarcinoma  INTERVAL HISTORY:   Mr. Vale returns as scheduled.  He continues monthly Durvalumab.  No rash or diarrhea.  He denies nausea/vomiting.  No mouth sores.  He has a good appetite.  Objective:  Vital signs in last 24 hours:  Blood pressure 128/86, pulse 64, temperature 98.6 F (37 C), temperature source Temporal, resp. rate 18, height 6\' 2"  (1.88 m), weight 221 lb 4.8 oz (100.4 kg), SpO2 99%.    HEENT: No thrush or ulcers. Resp: Lungs clear bilaterally. Cardio: Regular rate and rhythm. GI: Abdomen soft and nontender.  No hepatosplenomegaly. Vascular: No leg edema. Skin: No rash. Port-A-Cath without erythema.  Lab Results:  Lab Results  Component Value Date   WBC 6.8 05/04/2023   HGB 13.7 05/04/2023   HCT 39.7 05/04/2023   MCV 87.4 05/04/2023   PLT 194 05/04/2023   NEUTROABS 3.9 05/04/2023    Imaging:  No results found.  Medications: I have reviewed the patient's current medications.  Assessment/Plan: Liver mass concerning for malignancy -CT abdomen/pelvis with contrast 08/14/1998 23-2.9 x 2.7 x 2.7 cm hypoenhancing mass in segment 5 near the liver hilum just above hepatic ductal confluence suspicious for primary bile duct malignancy or primary hepatic malignancy -MRCP 08/14/2021-irregular mass of segment 8 of the liver with perilesional enhancement concerning for cholangiocarcinoma -08/14/2021 CA 19.9 was elevated at 76 -08/16/2021 AFP 6.4 -08/16/2021 cytology from bile duct brushing showed atypical cells -08/19/2021 cytology from bile duct brushing showed cells suspicious for malignancy -08/25/2021 CT-guided liver biopsy-moderately differentiated adenocarcinoma, CK7 positive, CDX2 positive in a small population of cells, TTF-1 negative, CK20 negative-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 -CTs at [facility] 09/06/2021-complete  occlusion of the left portal vein, occlusion of the anterior branch of the right portal vein, periportal enhancement and thickening on delayed imaging potentially due to tumor infiltration, no change in the 2 x 3.2 cm mass in segment 8, mild to moderate left and right intrahepatic biliary dilatation, biliary stent in place, small portacaval and periportal nodes, no evidence of metastatic disease in the chest -Cycle 1 gemcitabine/cisplatin/durvalumab 10/01/2021 -Cycle 2 gemcitabine/cisplatin/durvalumab 10/22/2021 -Cycle 3 gemcitabine/cisplatin/durvalumab 11/11/2021, D1 cisplatin held due to neuropathy, cisplatin resumed day 8 -Cycle 4 gemcitabine/cisplatin/durvalumab 12/03/2021 -MRI abdomen 12/15/2021-segment 8 cholangiocarcinoma no longer measurable, residual biliary duct dilation in the anterior right and left hepatic lobes, no evidence of metastatic disease persistent anterior right and new peripheral left portal vein occlusion -Cycle 5 gemcitabine/cisplatin/durvalumab 12/24/2021 -Cycle 6 gemcitabine/cisplatin/durvalumab 01/13/2022 -CTs at [facility] 02/14/2022-fine hypoattenuating masses in the left and right hepatic lobes, more prominent compared to the 12/15/2021 MRI, potentially representing multifocal cholangiocarcinoma, persistent occlusion of left portal and anterior branch of the right portal vein, no extrahepatic lymphadenopathy or metastatic disease -MRI liver 12/19 /2023-1.3 x 0.8 cm hypoenhancing lesion in segment 8, ill-defined area of delayed enhancement in the caudate measuring 1.7 cm, no left liver lesion, 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  Partial hepatectomy anatomy.  Marked improvement postsurgical fluid collections.  Biliary stent in place.  Mild biliary duct dilatation in the central hepatic lobes.  No evidence of metastatic disease in the abdomen/pelvis. -07/27/2022 continue every 4-week Durvalumab -CTs 10/18/2022-partial left hepatectomy, common hepatic and bile duct stent unchanged with unchanged segmental intrahepatic biliary duct dilation in the remnant left lobe and anterior right lobe.  No evidence of metastatic disease in the chest, abdomen, or pelvis.  Unchanged 0.5 cm subpleural nodule in the right middle lobe -10/19/2022 continue every 4-week Durvalumab -CTs 02/02/2023-no evidence of disease progression, surgical treatments at the anterior margin of the right liver, left hepatectomy, stable right middle lobe nodule -02/09/2023-every 4-week Durvalumab continued 2.  Obstructive jaundice -08/16/2021 percutaneous biliary drain placement 3.  Hepatic steatosis 4.  GERD 5.  Hypertension 6.  Normocytic anemia 7.  Enlarged prostate seen on CT 8.  Tobacco use 9.  Enterococcus and Aerococcus bacteremia 08/20/2021-discharged 08/26/2021 to complete outpatient course of Augmentin 10.  Peripheral neuropathy 11.  Port-A-Cath placement 09/23/2021 12.  Biliary drain exchange 09/23/2021; drain capped 10/12/2021 13.  Rigors following biliary drain procedure 09/23/2021, biliary drain culture-Enterobacter Cloacae, Bactrim x7 days 09/27/2021 14.  Right leg edema and pain 10/18/2021-Doppler negative for DVT 15.  Admission 03/18/2022 with a right upper quadrant abscess, status post drain placement, culture positive for Klebsiella CTs 03/21/2022-no change in moderate volume ascites, interval decrease in subhepatic air-filled collection with catheter in appropriate position, resolved right pneumothorax,  no change in bilateral pleural effusions-moderate on right and small on left CTs 04/25/2022 at [facility]-3.1 x 2.0 cm fluid collection in the abdomen and pelvis, decreased in volume and increased in organization; resolution of subhepatic collection; increased partially visualized large right pleural effusion with near complete collapse of the right lower lobe. Drainage catheter removed 04/25/2022 16.  Large right pleural effusion on CT 04/25/2022-thoracentesis 04/29/2022, 1.75 L of pleural fluid removed; culture negative, cytology with reactive mesothelial cells present, acute inflammation 17.  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 Masi appears stable.  He continues monthly Durvalumab.  He is tolerating well.  There is no clinical evidence of disease progression.  Plan to proceed with treatment today as scheduled.  CBC and chemistry panel reviewed.  Labs adequate to proceed as above.  We will follow-up on the thyroid studies from today.  PSA from today is pending.  We will follow-up with Dr. Ronne Binning once the result is available.  Mr. Delpilar will return for follow-up and treatment in 4 weeks.  We are available to see him sooner if needed  Lonna Cobb ANP/GNP-BC   05/04/2023  11:29 AM

## 2023-05-05 LAB — T4: T4, Total: 7.5 ug/dL (ref 4.5–12.0)

## 2023-05-05 LAB — CANCER ANTIGEN 19-9: CA 19-9: 10 U/mL (ref 0–35)

## 2023-05-05 LAB — PROSTATE-SPECIFIC AG, SERUM (LABCORP): Prostate Specific Ag, Serum: 5.9 ng/mL — ABNORMAL HIGH (ref 0.0–4.0)

## 2023-05-18 ENCOUNTER — Telehealth: Payer: Self-pay

## 2023-05-18 DIAGNOSIS — R972 Elevated prostate specific antigen [PSA]: Secondary | ICD-10-CM

## 2023-05-18 MED ORDER — LEVOFLOXACIN 750 MG PO TABS: 750.0000 mg | ORAL_TABLET | Freq: Once | ORAL | 0 refills | Status: DC

## 2023-05-18 NOTE — Telephone Encounter (Signed)
-----   Message from [facility] T sent at 05/18/2023 10:54 AM EDT -----  ----- Message ----- From: Malen Gauze, MD Sent: 05/16/2023  10:29 AM EDT To: Sarajane Jews, CMA  ISO PSA high. He needs a biopsy ----- Message ----- From: Sarajane Jews, CMA Sent: 05/02/2023   4:14 PM EDT To: Malen Gauze, MD  ISO PSA

## 2023-05-18 NOTE — Telephone Encounter (Signed)
 Patient returned call. Made aware of elevated Iso Psa and MD's recommendation for Prostate Biopsy.  Patient agreed.  Prostate biopsy instructions went over with patient via phone and sent via mail. Biopsy scheduled and orders placed.

## 2023-05-18 NOTE — Telephone Encounter (Signed)
 Patient called with no answer. Message left to return call to office and that a my-chart message would be sent.

## 2023-05-19 ENCOUNTER — Other Ambulatory Visit: Payer: Self-pay

## 2023-05-19 MED ORDER — LEVOFLOXACIN 750 MG PO TABS: 750.0000 mg | ORAL_TABLET | Freq: Every day | ORAL | 0 refills | Status: DC

## 2023-05-23 ENCOUNTER — Encounter (HOSPITAL_BASED_OUTPATIENT_CLINIC_OR_DEPARTMENT_OTHER): Payer: Self-pay | Admitting: Primary Care

## 2023-05-23 ENCOUNTER — Ambulatory Visit (INDEPENDENT_AMBULATORY_CARE_PROVIDER_SITE_OTHER): Payer: Managed Care, Other (non HMO) | Admitting: Primary Care

## 2023-05-23 VITALS — BP 124/82 | HR 59 | Ht 74.0 in | Wt 225.9 lb

## 2023-05-23 DIAGNOSIS — R0683 Snoring: Secondary | ICD-10-CM | POA: Diagnosis not present

## 2023-05-23 NOTE — Patient Instructions (Signed)
 -  OBSTRUCTIVE SLEEP APNEA (OSA): OSA is a condition where the airway becomes blocked during sleep, causing breathing interruptions. We will conduct a three-night home sleep study with [facility] to determine the severity of your OSA. Based on the results, treatment options may include weight loss, positional therapy, an oral appliance, or CPAP therapy. Untreated OSA can lead to serious health issues such as heart problems, diabetes, and Alzheimer's disease. We also discussed insurance coverage for the home sleep study and CPAP therapy.  -HYPERTENSION: Hypertension, or high blood pressure, is important to monitor because it increases the risk of cardiovascular problems, especially if OSA is left untreated. We will continue to monitor your blood pressure as part of your overall health management.  INSTRUCTIONS: Please complete the three-night home sleep study with [facility] as ordered. Follow up with [place] once the study is completed to discuss the results and next steps.  Follow-up Call office 2-3 weeks after completing sleep study to get results

## 2023-05-23 NOTE — Progress Notes (Signed)
 @Patient  ID: Jeffrey Blevins, male    DOB: 04-Jan-1966, 58 y.o.   MRN: [id number]  Chief Complaint  Patient presents with   Establish Care    Obstructive sleep apnea    Referring provider: Hilbert Bible, *  HPI: 58 year old male, former smoker. PMH significant for HTN, GERD, hilar cholangiocarcinoma.   05/23/2023 Discussed the use of AI scribe software for clinical note transcription with the patient, who gave verbal consent to proceed.  History of Present Illness   Jeffrey Blevins is a 58 year old male who presents with sleep disturbances and snoring. He was referred by his primary care physician for evaluation of snoring and sleep disturbances.  He experiences loud snoring that disrupts his sleep and occasionally wakes him up at night. His wife has noticed that his snoring is loud enough to be heard from another room. He describes episodes where he feels his breath is 'cut off' during sleep, leading to waking up with a dry mouth. He also experiences mucus in the roof of his mouth, which he suspects may be from his nasal cavity.  He goes to bed between 9 and 10 PM and typically falls asleep within 10 to 15 minutes. However, he wakes up approximately five times a night, which he attributes to restless sleep. He starts his day typically at 8am. He notes that he is a restless sleeper, often tossing and turning, which results in fitted sheets becoming loose by morning.  No history of congestive heart failure, myocardial infarction, COPD, asthma, or seizures. He is not on oxygen therapy. His only cardiac history is hypertension. No concern for narcolepsy or cataplexy. Epworth score 13/24  Allergies  Allergen Reactions   Codeine Nausea And Vomiting and Rash    Immunization History  Administered Date(s) Administered   Influenza, Seasonal, Injecte, Preservative Fre 03/09/2023   Janssen (J&J) SARS-COV-2 Vaccination 07/17/2019    Past Medical History:  Diagnosis Date   Basal cell  carcinoma 11/10/2022   rigght preauricular area  needs mohs   Cholangiocarcinoma (HCC)    Dx 07/2021.  Followed by Dr. Truett Perna, Dr. Modesta Messing at [facility]   GERD (gastroesophageal reflux disease)    Hiatal hernia    History of Barrett's esophagus    per pt dx yrs ago, but with last egd none noted 05/ 2021   History of esophageal dilatation    per pt hx several times last one approx. 2009 then 05/ 2021  for stricture   Hypertension    followed by pcp   Raynauds phenomenon    Right hydrocele     Tobacco History: Social History   Tobacco Use  Smoking Status Former   Types: Cigarettes   Passive exposure: Current  Smokeless Tobacco Never  Tobacco Comments   10-06-2020  per pt 1pp 7days   Counseling given: Not Answered Tobacco comments: 10-06-2020  per pt 1pp 7days   Outpatient Medications Prior to Visit  Medication Sig Dispense Refill   amLODipine (NORVASC) 5 MG tablet Take 1 tablet (5 mg total) by mouth daily. 30 tablet 1   esomeprazole (NEXIUM) 20 MG capsule TAKE 1 CAPSULE BY MOUTH EVERY MORNING 90 capsule 1   ibuprofen (ADVIL) 200 MG tablet Take 400 mg by mouth 3 (three) times daily as needed for headache (pain).     levofloxacin (LEVAQUIN) 750 MG tablet Take 1 tablet (750 mg total) by mouth daily. Take 1 hour prior to your prostate biopsy procedure. 1 tablet 0   levothyroxine (SYNTHROID)  50 MCG tablet Take 1 tablet (50 mcg total) by mouth daily before breakfast. 60 tablet 0   tadalafil (CIALIS) 20 MG tablet Take 1 tablet (20 mg total) by mouth at bedtime as needed. 10 tablet 5   apixaban (ELIQUIS) 5 MG TABS tablet TAKE 1 TABLET BY MOUTH TWICE A DAY (Patient not taking: Reported on 05/23/2023) 60 tablet 5   Facility-Administered Medications Prior to Visit  Medication Dose Route Frequency Provider Last Rate Last Admin   magnesium sulfate 2 GM/50ML IVPB            palonosetron (ALOXI) 0.25 MG/5ML injection             Review of Systems  Review of Systems  Constitutional:   Positive for fatigue.  Respiratory:  Positive for apnea.   Psychiatric/Behavioral:  Positive for sleep disturbance.    Physical Exam  BP 124/82   Pulse (!) 59   Ht 6\' 2"  (1.88 m)   Wt 225 lb 14.4 oz (102.5 kg)   SpO2 99%   BMI 29.00 kg/m  Physical Exam Constitutional:      Appearance: Normal appearance.  HENT:     Head: Normocephalic and atraumatic.     Mouth/Throat:     Comments: Mallampati class II-III Cardiovascular:     Rate and Rhythm: Normal rate and regular rhythm.  Pulmonary:     Effort: Pulmonary effort is normal.     Breath sounds: Normal breath sounds.  Skin:    General: Skin is warm and dry.  Neurological:     General: No focal deficit present.     Mental Status: He is alert and oriented to person, place, and time. Mental status is at baseline.  Psychiatric:        Mood and Affect: Mood normal.        Behavior: Behavior normal.        Thought Content: Thought content normal.        Judgment: Judgment normal.      Lab Results:  CBC    Component Value Date/Time   WBC 6.8 05/04/2023 1055   WBC 20.2 (H) 03/17/2022 2025   RBC 4.54 05/04/2023 1055   HGB 13.7 05/04/2023 1055   HCT 39.7 05/04/2023 1055   PLT 194 05/04/2023 1055   MCV 87.4 05/04/2023 1055   MCH 30.2 05/04/2023 1055   MCHC 34.5 05/04/2023 1055   RDW 12.5 05/04/2023 1055   LYMPHSABS 2.2 05/04/2023 1055   MONOABS 0.4 05/04/2023 1055   EOSABS 0.3 05/04/2023 1055   BASOSABS 0.0 05/04/2023 1055    BMET    Component Value Date/Time   NA 139 05/04/2023 1055   K 4.4 05/04/2023 1055   CL 106 05/04/2023 1055   CO2 25 05/04/2023 1055   GLUCOSE 88 05/04/2023 1055   BUN 21 (H) 05/04/2023 1055   CREATININE 0.99 05/04/2023 1055   CALCIUM 8.8 (L) 05/04/2023 1055   GFRNONAA >60 05/04/2023 1055    BNP No results found for: "BNP"  ProBNP No results found for: "PROBNP"  Imaging: No results found.   Assessment & Plan:   1. Loud snoring (Primary) - Home sleep test; Future      Obstructive Sleep Apnea (OSA) Patient experiences loud snoring, restless sleep and daytime sleepiness. BMI 29. Epworth score 13/24. Reviewed causes of sleep apnea, risks of untreated OSA risks include atrial fibrillation, pulmonary hypertension, diabetes, and Alzheimer's disease and potential treatment options. A home sleep study is suitable given no significant cardiac history, lung  conditions, or seizures. - Order three-night home sleep study with [facility]. - Discuss treatment options based on severity: weight loss, positional therapy, oral appliance, or CPAP. - Educate on risks of untreated sleep apnea: cardiac arrhythmias, pulmonary hypertension, diabetes and increased risk for Alzheimer's disease. - Inform about insurance coverage for home sleep study and CPAP; oral appliances not typically covered.  Hypertension Hypertension is relevant due to increased cardiovascular risks with untreated OSA.      Glenford Bayley, NP 05/23/2023

## 2023-05-23 NOTE — Progress Notes (Signed)
 Epworth Sleepiness Scale  Use the following scale to choose the most appropriate number for each situation. 0 Would never nod off 1  Slight  chance of nodding off 2 Moderate chance of nodding off 3 High chance of nodding off  Sitting and reading: 3 Watching TV: 3 Sitting, inactive, in a public place (e.g., in a meeting, theater, or dinner event): 1 As a passenger in a car for an hour or more without stopping for a break: 1 Lying down to rest when circumstances permit:2 Sitting and talking to someone: 1 Sitting quietly after a meal without alcohol: 2 In a car, while stopped for a few  minutes in traffic or at a light: 0  TOTOAL: 13

## 2023-05-25 ENCOUNTER — Other Ambulatory Visit: Payer: Self-pay | Admitting: Oncology

## 2023-06-01 ENCOUNTER — Inpatient Hospital Stay: Payer: Managed Care, Other (non HMO)

## 2023-06-01 ENCOUNTER — Inpatient Hospital Stay: Payer: Managed Care, Other (non HMO) | Attending: Oncology

## 2023-06-01 ENCOUNTER — Inpatient Hospital Stay (HOSPITAL_BASED_OUTPATIENT_CLINIC_OR_DEPARTMENT_OTHER): Payer: Managed Care, Other (non HMO) | Admitting: Oncology

## 2023-06-01 VITALS — BP 121/78 | HR 63 | Temp 97.9°F | Resp 18 | Ht 74.0 in | Wt 227.1 lb

## 2023-06-01 DIAGNOSIS — C221 Intrahepatic bile duct carcinoma: Secondary | ICD-10-CM | POA: Diagnosis present

## 2023-06-01 DIAGNOSIS — Z5112 Encounter for antineoplastic immunotherapy: Secondary | ICD-10-CM | POA: Diagnosis present

## 2023-06-01 DIAGNOSIS — Z95828 Presence of other vascular implants and grafts: Secondary | ICD-10-CM

## 2023-06-01 DIAGNOSIS — Z79620 Long term (current) use of immunosuppressive biologic: Secondary | ICD-10-CM | POA: Diagnosis not present

## 2023-06-01 LAB — CMP (CANCER CENTER ONLY)
ALT: 17 U/L (ref 0–44)
AST: 19 U/L (ref 15–41)
Albumin: 4.2 g/dL (ref 3.5–5.0)
Alkaline Phosphatase: 70 U/L (ref 38–126)
Anion gap: 7 (ref 5–15)
BUN: 22 mg/dL — ABNORMAL HIGH (ref 6–20)
CO2: 26 mmol/L (ref 22–32)
Calcium: 9 mg/dL (ref 8.9–10.3)
Chloride: 106 mmol/L (ref 98–111)
Creatinine: 1.03 mg/dL (ref 0.61–1.24)
GFR, Estimated: >60 mL/min (ref >60)
Glucose, Bld: 132 mg/dL — ABNORMAL HIGH (ref 70–99)
Potassium: 3.9 mmol/L (ref 3.5–5.1)
Sodium: 139 mmol/L (ref 135–145)
Total Bilirubin: 0.5 mg/dL (ref 0.0–1.2)
Total Protein: 6.8 g/dL (ref 6.5–8.1)

## 2023-06-01 LAB — CBC WITH DIFFERENTIAL (CANCER CENTER ONLY)
Abs Immature Granulocytes: 0.02 10*3/uL (ref 0.00–0.07)
Basophils Absolute: 0 10*3/uL (ref 0.0–0.1)
Basophils Relative: 1 %
Eosinophils Absolute: 0.4 10*3/uL (ref 0.0–0.5)
Eosinophils Relative: 5 %
HCT: 40.4 % (ref 39.0–52.0)
Hemoglobin: 13.9 g/dL (ref 13.0–17.0)
Immature Granulocytes: 0 %
Lymphocytes Relative: 30 %
Lymphs Abs: 2.3 10*3/uL (ref 0.7–4.0)
MCH: 30.2 pg (ref 26.0–34.0)
MCHC: 34.4 g/dL (ref 30.0–36.0)
MCV: 87.6 fL (ref 80.0–100.0)
Monocytes Absolute: 0.3 10*3/uL (ref 0.1–1.0)
Monocytes Relative: 4 %
Neutro Abs: 4.7 10*3/uL (ref 1.7–7.7)
Neutrophils Relative %: 60 %
Platelet Count: 148 10*3/uL — ABNORMAL LOW (ref 150–400)
RBC: 4.61 MIL/uL (ref 4.22–5.81)
RDW: 13.1 % (ref 11.5–15.5)
WBC Count: 7.8 10*3/uL (ref 4.0–10.5)
nRBC: 0 % (ref 0.0–0.2)

## 2023-06-01 LAB — TSH: TSH: 44.145 u[IU]/mL — ABNORMAL HIGH (ref 0.350–4.500)

## 2023-06-01 MED ORDER — SODIUM CHLORIDE 0.9% FLUSH
10.0000 mL | INTRAVENOUS | Status: DC | PRN
  Administered 2023-06-01: 10 mL

## 2023-06-01 MED ORDER — HEPARIN SOD (PORK) LOCK FLUSH 100 UNIT/ML IV SOLN
500.0000 [IU] | Freq: Once | INTRAVENOUS | Status: AC | PRN
  Administered 2023-06-01: 500 [IU]

## 2023-06-01 MED ORDER — SODIUM CHLORIDE 0.9% FLUSH
10.0000 mL | Freq: Once | INTRAVENOUS | Status: AC
  Administered 2023-06-01: 10 mL via INTRAVENOUS

## 2023-06-01 MED ORDER — SODIUM CHLORIDE 0.9 % IV SOLN
1500.0000 mg | Freq: Once | INTRAVENOUS | Status: AC
  Administered 2023-06-01: 1500 mg via INTRAVENOUS
  Filled 2023-06-01: qty 30

## 2023-06-01 MED ORDER — SODIUM CHLORIDE 0.9 % IV SOLN: Freq: Once | INTRAVENOUS | Status: AC

## 2023-06-01 NOTE — Patient Instructions (Signed)

## 2023-06-01 NOTE — Progress Notes (Signed)
 Jeffrey Blevins expresses that she feels her and Mr. Schimming don't communicate well. He keeps emotions at bay and this upsets her. Provided her information on [employer] and business card of CSW, [place].

## 2023-06-01 NOTE — Progress Notes (Signed)
 [facility] OFFICE PROGRESS NOTE   Diagnosis: Cholangiocarcinoma  INTERVAL HISTORY:   Jeffrey Blevins returns as scheduled.  He continues treatment with durvalumab.  He feels well.  No rash or diarrhea.  He saw Dr. Ronne Binning to evaluate the elevated PSA.  He is scheduled for prostate biopsy later this month.  He is taking thyroid hormone.  Objective:  Vital signs in last 24 hours:  Blood pressure 121/78, pulse 63, temperature 97.9 F (36.6 C), temperature source Temporal, resp. rate 18, height 6\' 2"  (1.88 m), weight 227 lb 1.6 oz (103 kg), SpO2 100%.  Resp: Lungs clear bilaterally Cardio: Regular rate and rhythm GI: No hepatosplenomegaly, no mass, no apparent ascites Vascular: No leg edema   Portacath/PICC-without erythema  Lab Results:  Lab Results  Component Value Date   WBC 7.8 06/01/2023   HGB 13.9 06/01/2023   HCT 40.4 06/01/2023   MCV 87.6 06/01/2023   PLT 148 (L) 06/01/2023   NEUTROABS 4.7 06/01/2023    CMP  Lab Results  Component Value Date   NA 139 06/01/2023   K 3.9 06/01/2023   CL 106 06/01/2023   CO2 26 06/01/2023   GLUCOSE 132 (H) 06/01/2023   BUN 22 (H) 06/01/2023   CREATININE 1.03 06/01/2023   CALCIUM 9.0 06/01/2023   PROT 6.8 06/01/2023   ALBUMIN 4.2 06/01/2023   AST 19 06/01/2023   ALT 17 06/01/2023   ALKPHOS 70 06/01/2023   BILITOT 0.5 06/01/2023   GFRNONAA >60 06/01/2023    Lab Results  Component Value Date   ZHY865 10 05/04/2023    Lab Results  Component Value Date   INR 1.1 08/25/2021   LABPROT 13.9 08/25/2021    Imaging:  No results found.  Medications: I have reviewed the patient's current medications.   Assessment/Plan: Liver mass concerning for malignancy -CT abdomen/pelvis with contrast 08/14/1998 23-2.9 x 2.7 x 2.7 cm hypoenhancing mass in segment 5 near the liver hilum just above hepatic ductal confluence suspicious for primary bile duct malignancy or primary hepatic malignancy -MRCP 08/14/2021-irregular  mass of segment 8 of the liver with perilesional enhancement concerning for cholangiocarcinoma -08/14/2021 CA 19.9 was elevated at 76 -08/16/2021 AFP 6.4 -08/16/2021 cytology from bile duct brushing showed atypical cells -08/19/2021 cytology from bile duct brushing showed cells suspicious for malignancy -08/25/2021 CT-guided liver biopsy-moderately differentiated adenocarcinoma, CK7 positive, CDX2 positive in a small population of cells, TTF-1 negative, CK20 negative-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 -CTs at [facility] 09/06/2021-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, occlusion of the anterior branch of the right portal vein, periportal enhancement and thickening on delayed imaging potentially due to tumor infiltration, no change in the 2 x 3.2 cm mass in segment 8, mild to moderate left and right intrahepatic biliary dilatation, biliary stent in place, small portacaval and periportal nodes, no evidence of metastatic disease in the chest -Cycle 1 gemcitabine/cisplatin/durvalumab 10/01/2021 -Cycle 2 gemcitabine/cisplatin/durvalumab 10/22/2021 -Cycle 3 gemcitabine/cisplatin/durvalumab 11/11/2021, D1 cisplatin held due to neuropathy, cisplatin resumed day 8 -Cycle 4 gemcitabine/cisplatin/durvalumab 12/03/2021 -MRI abdomen 12/15/2021-segment 8 cholangiocarcinoma no longer measurable, residual biliary duct dilation in the anterior right and left hepatic lobes, no evidence of metastatic disease persistent anterior right and new peripheral left portal vein occlusion -Cycle 5 gemcitabine/cisplatin/durvalumab 12/24/2021 -Cycle 6 gemcitabine/cisplatin/durvalumab 01/13/2022 -CTs at [facility] 02/14/2022-fine hypoattenuating masses in the left and right hepatic lobes, more prominent compared to the 12/15/2021 MRI, potentially representing multifocal cholangiocarcinoma, persistent occlusion of left portal and anterior branch of the right  portal vein, no  extrahepatic lymphadenopathy or metastatic disease -MRI liver 12/19 /2023-1.3 x 0.8 cm hypoenhancing lesion in segment 8, ill-defined area of delayed enhancement in the caudate measuring 1.7 cm, no left liver lesion, 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  Partial hepatectomy anatomy.  Marked improvement postsurgical fluid collections.  Biliary stent in place.  Mild biliary duct dilatation in the central hepatic lobes.  No evidence of metastatic disease in the abdomen/pelvis. -07/27/2022 continue every 4-week Durvalumab -CTs 10/18/2022-partial left hepatectomy, common hepatic and bile duct stent unchanged with unchanged segmental intrahepatic biliary duct dilation in the remnant left lobe and anterior right lobe.  No evidence of metastatic disease in the chest, abdomen, or pelvis.  Unchanged 0.5 cm subpleural nodule in the right middle lobe -10/19/2022 continue every 4-week Durvalumab -CTs 02/02/2023-no evidence of disease progression, surgical treatments at the anterior margin of the right liver, left hepatectomy, stable right middle lobe nodule -02/09/2023-every 4-week Durvalumab continued 2.  Obstructive jaundice -08/16/2021 percutaneous biliary drain placement 3.  Hepatic steatosis 4.  GERD 5.  Hypertension 6.  Normocytic anemia 7.  Enlarged prostate seen on CT 8.  Tobacco use 9.  Enterococcus and Aerococcus bacteremia 08/20/2021-discharged 08/26/2021 to complete outpatient course of Augmentin 10.   Peripheral neuropathy 11.  Port-A-Cath placement 09/23/2021 12.  Biliary drain exchange 09/23/2021; drain capped 10/12/2021 13.  Rigors following biliary drain procedure 09/23/2021, biliary drain culture-Enterobacter Cloacae, Bactrim x7 days 09/27/2021 14.  Right leg edema and pain 10/18/2021-Doppler negative for DVT 15.  Admission 03/18/2022 with a right upper quadrant abscess, status post drain placement, culture positive for Klebsiella CTs 03/21/2022-no change in moderate volume ascites, interval decrease in subhepatic air-filled collection with catheter in appropriate position, resolved right pneumothorax, no change in bilateral pleural effusions-moderate on right and small on left CTs 04/25/2022 at [facility]-3.1 x 2.0 cm fluid collection in the abdomen and pelvis, decreased in volume and increased in organization; resolution of subhepatic collection; increased partially visualized large right pleural effusion with near complete collapse of the right lower lobe. Drainage catheter removed 04/25/2022 16.  Large right pleural effusion on CT 04/25/2022-thoracentesis 04/29/2022, 1.75 L of pleural fluid removed; culture negative, cytology with reactive mesothelial cells present, acute inflammation 17.  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  He continues monthly durvalumab.  There is no clinical evidence for progression of the cholangiocarcinoma.  He will undergo surveillance CTs in June.  He will complete another treat with durvalumab today.  He will return for an office and lab visit in 1 month.  He is being evaluated by urology for the elevated PSA.  We will follow-up on the thyroid panel from today.  Thornton Papas, MD  06/01/2023  10:33 AM

## 2023-06-01 NOTE — Progress Notes (Signed)
 Patient seen by Dr. Thornton Papas today  Vitals are within treatment parameters:Yes   Labs are within treatment parameters: Yes   Treatment plan has been signed: Yes   Per physician team, Patient is ready for treatment and there are NO modifications to the treatment plan.

## 2023-06-02 LAB — T4: T4, Total: 4.9 ug/dL (ref 4.5–12.0)

## 2023-06-08 ENCOUNTER — Encounter

## 2023-06-08 DIAGNOSIS — R0683 Snoring: Secondary | ICD-10-CM

## 2023-06-21 ENCOUNTER — Encounter: Payer: Self-pay | Admitting: Urology

## 2023-06-21 ENCOUNTER — Other Ambulatory Visit: Payer: Self-pay | Admitting: Urology

## 2023-06-21 ENCOUNTER — Ambulatory Visit (INDEPENDENT_AMBULATORY_CARE_PROVIDER_SITE_OTHER): Admitting: Urology

## 2023-06-21 ENCOUNTER — Ambulatory Visit (HOSPITAL_COMMUNITY)
Admission: RE | Admit: 2023-06-21 | Discharge: 2023-06-21 | Disposition: A | Discharge status: Home / Self Care | Source: Ambulatory Visit | Attending: Urology | Admitting: Urology

## 2023-06-21 ENCOUNTER — Encounter (HOSPITAL_COMMUNITY): Payer: Self-pay

## 2023-06-21 DIAGNOSIS — R972 Elevated prostate specific antigen [PSA]: Secondary | ICD-10-CM | POA: Diagnosis not present

## 2023-06-21 DIAGNOSIS — D291 Benign neoplasm of prostate: Secondary | ICD-10-CM | POA: Diagnosis not present

## 2023-06-21 MED ORDER — LIDOCAINE HCL (PF) 2 % IJ SOLN
10.0000 mL | Freq: Once | INTRAMUSCULAR | Status: AC
  Administered 2023-06-21: 10 mL via INTRADERMAL

## 2023-06-21 MED ORDER — STERILE WATER FOR INJECTION IJ SOLN
INTRAMUSCULAR | Status: AC
  Filled 2023-06-21: qty 10

## 2023-06-21 MED ORDER — LIDOCAINE HCL (PF) 2 % IJ SOLN
INTRAMUSCULAR | Status: AC
  Filled 2023-06-21: qty 10

## 2023-06-21 MED ORDER — GENTAMICIN SULFATE 40 MG/ML IJ SOLN
80.0000 mg | Freq: Once | INTRAMUSCULAR | Status: AC
  Administered 2023-06-21: 80 mg via INTRAMUSCULAR

## 2023-06-21 MED ORDER — GENTAMICIN SULFATE 40 MG/ML IJ SOLN
INTRAMUSCULAR | Status: AC
  Filled 2023-06-21: qty 2

## 2023-06-21 NOTE — Progress Notes (Signed)
 Prostate Biopsy Procedure   Informed consent was obtained after discussing risks/benefits of the procedure.  A time out was performed to ensure correct patient identity.  Pre-Procedure: - Last PSA Level: No results found for: "PSA" - Gentamicin  given prophylactically - Levaquin  500 mg administered PO -Transrectal Ultrasound performed revealing a 55.1 gm prostate -No significant hypoechoic or median lobe noted  Procedure: - Prostate block performed using 10 cc 1% lidocaine  and biopsies taken from sextant areas, a total of 12 under ultrasound guidance.  Post-Procedure: - Patient tolerated the procedure well - He was counseled to seek immediate medical attention if experiences any severe pain, significant bleeding, or fevers - Return in one week to discuss biopsy results

## 2023-06-21 NOTE — Progress Notes (Signed)
 Pt brought to US  1 in no obvious distress. Prostate biopsy procedure explained. Consent obtained. Atb admin as charted without adverse reaction. Prepped and draped in sterile fashion. Left bedside to attend 2nd biopsy patient. Upon returning to room patient had cool compress placed on head, US  staff stated that patient was exhibiting vagal response following biopsy procedure. BP elevated from initial value. Alert and oriented, no resp distress, no evidence of hives. Upon repeating sequence of blood pressure readings, patient admitted he had not taken Norvasc  earlier today as prescribed, and that he did not receive direction to omit the scheduled dose. Advised patien, after blood pressure trended back down toward initial admitting value that he should resume the Norvasc  upon getting home.

## 2023-06-21 NOTE — Patient Instructions (Signed)
 Transrectal Ultrasound-Guided Prostate Biopsy, Care After What can I expect after the procedure? After the procedure, it is common to have: Pain and discomfort near your butt (rectum), especially while sitting. Pink-colored pee (urine). This is due to small amounts of blood in your pee. A burning feeling while peeing. Blood in your poop (stool). Bleeding from your butt. Blood in your semen. Follow these instructions at home: Medicines Take over-the-counter and prescription medicines only as told by your doctor. If you were given a sedative during your procedure, do not drive or use machines until your doctor says that it is safe. A sedative is a medicine that helps you relax. If you were prescribed an antibiotic medicine, take it as told by your doctor. Do not stop taking it even if you start to feel better. Activity  Return to your normal activities when your doctor says that it is safe. Ask your doctor when it is okay for you to have sex. You may have to avoid lifting. Ask your doctor how much you can safely lift. General instructions  Drink enough water to keep your pee pale yellow. Watch your pee, poop, and semen for new bleeding or bleeding that gets worse. Keep all follow-up visits. Contact a doctor if: You have any of these: Blood clots in your pee or poop. Blood in your pee more than 2 weeks after the procedure. Blood in your semen more than 2 months after the procedure. New or worse bleeding in your pee, poop, or semen. Very bad belly pain. Your pee smells bad or unusual. You have trouble peeing. Your lower belly feels firm. You have problems getting an erection. You feel like you may vomit (are nauseous), or you vomit. Get help right away if: You have a fever or chills. You have bright red pee. You have very bad pain that does not get better with medicine. You cannot pee. Summary After this procedure, it is common to have pain and discomfort near your butt,  especially while sitting. You may have blood in your pee and poop. It is common to have blood in your semen. Get help right away if you have a fever or chills. This information is not intended to replace advice given to you by your health care provider. Make sure you discuss any questions you have with your health care provider. Document Revised: 08/10/2020 Document Reviewed: 08/10/2020 Elsevier Patient Education  2024 [employer].

## 2023-06-23 LAB — SURGICAL PATHOLOGY

## 2023-06-25 ENCOUNTER — Telehealth: Payer: Self-pay | Admitting: Pulmonary Disease

## 2023-06-25 ENCOUNTER — Other Ambulatory Visit: Payer: Self-pay | Admitting: Oncology

## 2023-06-25 DIAGNOSIS — G4733 Obstructive sleep apnea (adult) (pediatric): Secondary | ICD-10-CM | POA: Diagnosis not present

## 2023-06-25 DIAGNOSIS — R0683 Snoring: Secondary | ICD-10-CM

## 2023-06-25 NOTE — Telephone Encounter (Signed)
 Call patient  Sleep study result  Date of study: 06/08/2023  Impression: Severe obstructive sleep apnea with severe oxygen desaturations, AHI of 40.1 with oxygen nadir of 76%  Recommendation: DME referral  Recommend CPAP therapy for severe obstructive sleep apnea  Auto titrating CPAP with pressure settings of 5-15 will be appropriate, with patient's mask of choice  Encourage weight loss measures  Follow-up in the office 4 to 6 weeks following initiation of treatment

## 2023-06-27 ENCOUNTER — Telehealth: Payer: Self-pay

## 2023-06-27 DIAGNOSIS — R972 Elevated prostate specific antigen [PSA]: Secondary | ICD-10-CM

## 2023-06-27 NOTE — Telephone Encounter (Signed)
 Spoke with patient regarding sleep study result's   Sleep study result   Date of study: 06/08/2023   Impression: Severe obstructive sleep apnea with severe oxygen desaturations, AHI of 40.1 with oxygen nadir of 76%   Recommendation: DME referral   Recommend CPAP therapy for severe obstructive sleep apnea   Auto titrating CPAP with pressure settings of 5-15 will be appropriate, with patient's mask of choice   Encourage weight loss measures   Follow-up in the office 4 to 6 weeks following initiation of treatment Advised patient I will place a order for CPAP. Advised patient once patient receives CPAP to call our office to make a 31-90 day compliance f/u . Patient's voice was understanding. Nothing else further needed.

## 2023-06-27 NOTE — Telephone Encounter (Signed)
ATC x1 lvm for patient to call our office back regarding sleep study result's.

## 2023-06-27 NOTE — Telephone Encounter (Signed)
 Patient called and made aware of negative prostate biopsy. Biopsy talk rescheduled for 6 month follow up with psa. Appointments made and sent via mail.

## 2023-06-27 NOTE — Telephone Encounter (Signed)
-----   Message from Jeffrey Blevins sent at 06/27/2023 10:21 AM EDT ----- Negative. F/u 6 months with PSA ----- Message ----- From: Interface, Lab In Three Zero One Sent: 06/23/2023  12:31 PM EDT To: Marco Severs, MD

## 2023-06-28 ENCOUNTER — Other Ambulatory Visit: Payer: Self-pay

## 2023-06-29 ENCOUNTER — Encounter: Payer: Self-pay | Admitting: Oncology

## 2023-06-29 ENCOUNTER — Other Ambulatory Visit: Payer: Self-pay

## 2023-06-29 ENCOUNTER — Inpatient Hospital Stay: Admitting: Nurse Practitioner

## 2023-06-29 ENCOUNTER — Inpatient Hospital Stay: Attending: Oncology

## 2023-06-29 ENCOUNTER — Inpatient Hospital Stay

## 2023-06-29 ENCOUNTER — Other Ambulatory Visit (HOSPITAL_BASED_OUTPATIENT_CLINIC_OR_DEPARTMENT_OTHER): Payer: Self-pay

## 2023-06-29 ENCOUNTER — Encounter: Payer: Self-pay | Admitting: Nurse Practitioner

## 2023-06-29 VITALS — BP 133/88 | HR 61 | Temp 97.7°F | Resp 18 | Wt 230.4 lb

## 2023-06-29 DIAGNOSIS — Z5112 Encounter for antineoplastic immunotherapy: Secondary | ICD-10-CM | POA: Insufficient documentation

## 2023-06-29 DIAGNOSIS — Z79620 Long term (current) use of immunosuppressive biologic: Secondary | ICD-10-CM | POA: Diagnosis not present

## 2023-06-29 DIAGNOSIS — C221 Intrahepatic bile duct carcinoma: Secondary | ICD-10-CM | POA: Insufficient documentation

## 2023-06-29 DIAGNOSIS — E032 Hypothyroidism due to medicaments and other exogenous substances: Secondary | ICD-10-CM | POA: Diagnosis not present

## 2023-06-29 LAB — CBC WITH DIFFERENTIAL (CANCER CENTER ONLY)
Abs Immature Granulocytes: 0.03 10*3/uL (ref 0.00–0.07)
Basophils Absolute: 0.1 10*3/uL (ref 0.0–0.1)
Basophils Relative: 1 %
Eosinophils Absolute: 0.4 10*3/uL (ref 0.0–0.5)
Eosinophils Relative: 6 %
HCT: 39.8 % (ref 39.0–52.0)
Hemoglobin: 13.9 g/dL (ref 13.0–17.0)
Immature Granulocytes: 0 %
Lymphocytes Relative: 33 %
Lymphs Abs: 2.6 10*3/uL (ref 0.7–4.0)
MCH: 30.5 pg (ref 26.0–34.0)
MCHC: 34.9 g/dL (ref 30.0–36.0)
MCV: 87.3 fL (ref 80.0–100.0)
Monocytes Absolute: 0.4 10*3/uL (ref 0.1–1.0)
Monocytes Relative: 5 %
Neutro Abs: 4.4 10*3/uL (ref 1.7–7.7)
Neutrophils Relative %: 55 %
Platelet Count: 153 10*3/uL (ref 150–400)
RBC: 4.56 MIL/uL (ref 4.22–5.81)
RDW: 13.2 % (ref 11.5–15.5)
WBC Count: 7.8 10*3/uL (ref 4.0–10.5)
nRBC: 0 % (ref 0.0–0.2)

## 2023-06-29 LAB — CMP (CANCER CENTER ONLY)
ALT: 19 U/L (ref 0–44)
AST: 26 U/L (ref 15–41)
Albumin: 4.4 g/dL (ref 3.5–5.0)
Alkaline Phosphatase: 91 U/L (ref 38–126)
Anion gap: 10 (ref 5–15)
BUN: 13 mg/dL (ref 6–20)
CO2: 25 mmol/L (ref 22–32)
Calcium: 9.4 mg/dL (ref 8.9–10.3)
Chloride: 103 mmol/L (ref 98–111)
Creatinine: 1.11 mg/dL (ref 0.61–1.24)
GFR, Estimated: >60 mL/min (ref >60)
Glucose, Bld: 103 mg/dL — ABNORMAL HIGH (ref 70–99)
Potassium: 3.8 mmol/L (ref 3.5–5.1)
Sodium: 138 mmol/L (ref 135–145)
Total Bilirubin: 0.5 mg/dL (ref 0.0–1.2)
Total Protein: 6.8 g/dL (ref 6.5–8.1)

## 2023-06-29 LAB — TSH: TSH: 90.9 u[IU]/mL — ABNORMAL HIGH (ref 0.350–4.500)

## 2023-06-29 MED ORDER — HEPARIN SOD (PORK) LOCK FLUSH 100 UNIT/ML IV SOLN
500.0000 [IU] | Freq: Once | INTRAVENOUS | Status: AC | PRN
Start: 2023-06-29 — End: 2023-06-29
  Administered 2023-06-29: 500 [IU]

## 2023-06-29 MED ORDER — LEVOTHYROXINE SODIUM 50 MCG PO TABS
50.0000 ug | ORAL_TABLET | Freq: Every day | ORAL | 3 refills | Status: DC
  Filled 2023-06-29: qty 30, 30d supply, fill #0

## 2023-06-29 MED ORDER — SODIUM CHLORIDE 0.9 % IV SOLN: Freq: Once | INTRAVENOUS | Status: AC

## 2023-06-29 MED ORDER — SODIUM CHLORIDE 0.9% FLUSH
10.0000 mL | INTRAVENOUS | Status: DC | PRN
  Administered 2023-06-29: 10 mL

## 2023-06-29 MED ORDER — AMLODIPINE BESYLATE 5 MG PO TABS
5.0000 mg | ORAL_TABLET | Freq: Every day | ORAL | 2 refills | Status: DC
  Filled 2023-06-29: qty 30, 30d supply, fill #0

## 2023-06-29 MED ORDER — SODIUM CHLORIDE 0.9 % IV SOLN
1500.0000 mg | Freq: Once | INTRAVENOUS | Status: AC
Start: 2023-06-29 — End: 2023-06-29
  Administered 2023-06-29: 1500 mg via INTRAVENOUS
  Filled 2023-06-29: qty 30

## 2023-06-29 NOTE — Progress Notes (Signed)
 [facility] OFFICE PROGRESS NOTE   Diagnosis: Cholangiocarcinoma  INTERVAL HISTORY:   Jeffrey Blevins returns as scheduled.  He continues Durvalumab .  No diarrhea or rash.  He denies nausea/vomiting.  No mouth sores.  Occasional dyspnea on exertion.  No cough or fever.  No pain.  He has a good appetite.  He is gaining weight.  Objective:  Vital signs in last 24 hours:  Blood pressure 133/88, pulse 61, temperature 97.7 F (36.5 C), temperature source Temporal, resp. rate 18, weight 230 lb 6.4 oz (104.5 kg), SpO2 97%.    HEENT: No thrush or ulcers. Resp: Lungs clear bilaterally. Cardio: Regular rate and rhythm. GI: No hepatosplenomegaly. Vascular: No leg edema. Neuro: Alert and oriented. Skin: No rash. Port-A-Cath without erythema.  Lab Results:  Lab Results  Component Value Date   WBC 7.8 06/29/2023   HGB 13.9 06/29/2023   HCT 39.8 06/29/2023   MCV 87.3 06/29/2023   PLT 153 06/29/2023   NEUTROABS 4.4 06/29/2023    Imaging:  No results found.  Medications: I have reviewed the patient's current medications.  Assessment/Plan: Liver mass concerning for malignancy -CT abdomen/pelvis with contrast 08/14/1998 23-2.9 x 2.7 x 2.7 cm hypoenhancing mass in segment 5 near the liver hilum just above hepatic ductal confluence suspicious for primary bile duct malignancy or primary hepatic malignancy -MRCP 08/14/2021-irregular mass of segment 8 of the liver with perilesional enhancement concerning for cholangiocarcinoma -08/14/2021 CA 19.9 was elevated at 76 -08/16/2021 AFP 6.4 -08/16/2021 cytology from bile duct brushing showed atypical cells -08/19/2021 cytology from bile duct brushing showed cells suspicious for malignancy -08/25/2021 CT-guided liver biopsy-moderately differentiated adenocarcinoma, CK7 positive, CDX2 positive in a small population of cells, TTF-1 negative, CK20 negative-consistent with intrahepatic cholangiocarcinoma versus metastatic disease from a  pancreaticobiliary primary -CTs at [facility] 09/06/2021-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, occlusion of the anterior branch of the right portal vein, periportal enhancement and thickening on delayed imaging potentially due to tumor infiltration, no change in the 2 x 3.2 cm mass in segment 8, mild to moderate left and right intrahepatic biliary dilatation, biliary stent in place, small portacaval and periportal nodes, no evidence of metastatic disease in the chest -Cycle 1 gemcitabine /cisplatin /durvalumab  10/01/2021 -Cycle 2 gemcitabine /cisplatin /durvalumab  10/22/2021 -Cycle 3 gemcitabine /cisplatin /durvalumab  11/11/2021, D1 cisplatin  held due to neuropathy, cisplatin  resumed day 8 -Cycle 4 gemcitabine /cisplatin /durvalumab  12/03/2021 -MRI abdomen 12/15/2021-segment 8 cholangiocarcinoma no longer measurable, residual biliary duct dilation in the anterior right and left hepatic lobes, no evidence of metastatic disease persistent anterior right and new peripheral left portal vein occlusion -Cycle 5 gemcitabine /cisplatin /durvalumab  12/24/2021 -Cycle 6 gemcitabine /cisplatin /durvalumab  01/13/2022 -CTs at [facility] 02/14/2022-fine hypoattenuating masses in the left and right hepatic lobes, more prominent compared to the 12/15/2021 MRI, potentially representing multifocal cholangiocarcinoma, persistent occlusion of left portal and anterior branch of the right portal vein, no extrahepatic lymphadenopathy or metastatic disease -MRI liver 12/19 /2023-1.3 x 0.8 cm hypoenhancing lesion in segment 8, ill-defined area of delayed enhancement in the caudate measuring 1.7 cm, no left liver lesion, 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  Partial hepatectomy anatomy.  Marked improvement postsurgical fluid collections.  Biliary stent in place.  Mild biliary duct dilatation in the central hepatic lobes.  No evidence of metastatic disease in the abdomen/pelvis. -07/27/2022 continue every 4-week Durvalumab  -CTs 10/18/2022-partial left hepatectomy, common hepatic and bile duct stent unchanged with unchanged segmental intrahepatic biliary duct dilation in the remnant left lobe and anterior right lobe.  No evidence of metastatic disease in the chest, abdomen, or pelvis.  Unchanged 0.5 cm subpleural nodule in the right middle lobe -10/19/2022 continue every 4-week Durvalumab  -CTs 02/02/2023-no evidence of disease progression, surgical treatments at the anterior margin of the right liver, left hepatectomy, stable right middle lobe nodule -02/09/2023-every 4-week Durvalumab  continued 2.  Obstructive jaundice -08/16/2021 percutaneous biliary drain placement 3.  Hepatic steatosis 4.  GERD 5.  Hypertension 6.  Normocytic anemia 7.  Enlarged prostate seen on CT 8.  Tobacco use 9.  Enterococcus and Aerococcus bacteremia 08/20/2021-discharged 08/26/2021 to complete outpatient course of Augmentin  10.  Peripheral neuropathy 11.  Port-A-Cath placement 09/23/2021 12.  Biliary drain exchange 09/23/2021; drain capped 10/12/2021 13.  Rigors following biliary drain procedure 09/23/2021, biliary drain culture-Enterobacter Cloacae, Bactrim  x7 days 09/27/2021 14.  Right leg edema and pain 10/18/2021-Doppler negative for DVT 15.  Admission 03/18/2022 with a right upper quadrant abscess, status post drain placement, culture positive for Klebsiella CTs 03/21/2022-no change in moderate volume ascites, interval decrease in subhepatic air-filled collection with  catheter in appropriate position, resolved right pneumothorax, no change in bilateral pleural effusions-moderate on right and small on left CTs 04/25/2022 at [facility]-3.1 x 2.0 cm fluid collection in the abdomen and pelvis, decreased in volume and increased in organization; resolution of subhepatic collection; increased partially visualized large right pleural effusion with near complete collapse of the right lower lobe. Drainage catheter removed 04/25/2022 16.  Large right pleural effusion on CT 04/25/2022-thoracentesis 04/29/2022, 1.75 L of pleural fluid removed; culture negative, cytology with reactive mesothelial cells present, acute inflammation 17.  Hypothyroidism, likely secondary to Durvalumab , thyroid  hormone replacement beginning November 2024 18.  06/21/2023 prostate biopsies with benign prostatic tissue    Disposition: Jeffrey Blevins appears stable.  He continues Durvalumab  on a monthly schedule.  He is tolerating treatment well.  There is no clinical evidence of disease progression.  Plan to continue the same.  Plan for restaging CTs in June.  Labs from today reviewed.  Appear adequate for treatment.  TSH is higher.  T4 pending.  He has not been taking his thyroid  medication.  New prescription sent to his pharmacy today.  We will continue to monitor.  He will return for follow-up and treatment in 4 weeks.  We are available to see him sooner if needed.  Diana Forster ANP/GNP-BC   06/29/2023  12:02 PM

## 2023-06-29 NOTE — Progress Notes (Signed)
 Patient seen by Lonna Cobb NP today  Vitals are within treatment parameters:Yes   Labs are within treatment parameters: Yes   Treatment plan has been signed: Yes   Per physician team, Patient is ready for treatment and there are NO modifications to the treatment plan.

## 2023-06-29 NOTE — Patient Instructions (Signed)
 [facility] - A DEPT OF [facility]   Discharge Instructions: Thank you for choosing [facility] to provide your oncology and hematology care.   If you have a lab appointment with the Cancer Center, please go directly to the Cancer Center and check in at the registration area.   Wear comfortable clothing and clothing appropriate for easy access to any Portacath or PICC line.   We strive to give you quality time with your provider. You may need to reschedule your appointment if you arrive late (15 or more minutes).  Arriving late affects you and other patients whose appointments are after yours.  Also, if you miss three or more appointments without notifying the office, you may be dismissed from the clinic at the provider's discretion.      For prescription refill requests, have your pharmacy contact our office and allow 72 hours for refills to be completed.    Today you received the following chemotherapy and/or immunotherapy agents IMFINZI      To help prevent nausea and vomiting after your treatment, we encourage you to take your nausea medication as directed.  BELOW ARE SYMPTOMS THAT SHOULD BE REPORTED IMMEDIATELY: *FEVER GREATER THAN 100.4 F (38 C) OR HIGHER *CHILLS OR SWEATING *NAUSEA AND VOMITING THAT IS NOT CONTROLLED WITH YOUR NAUSEA MEDICATION *UNUSUAL SHORTNESS OF BREATH *UNUSUAL BRUISING OR BLEEDING *URINARY PROBLEMS (pain or burning when urinating, or frequent urination) *BOWEL PROBLEMS (unusual diarrhea, constipation, pain near the anus) TENDERNESS IN MOUTH AND THROAT WITH OR WITHOUT PRESENCE OF ULCERS (sore throat, sores in mouth, or a toothache) UNUSUAL RASH, SWELLING OR PAIN  UNUSUAL VAGINAL DISCHARGE OR ITCHING   Items with * indicate a potential emergency and should be followed up as soon as possible or go to the Emergency Department if any problems should occur.  Please show the CHEMOTHERAPY ALERT CARD or IMMUNOTHERAPY  ALERT CARD at check-in to the Emergency Department and triage nurse.  Should you have questions after your visit or need to cancel or reschedule your appointment, please contact [facility] - A DEPT OF [facility]  Dept: [phone]  and follow the prompts.  Office hours are 8:00 a.m. to 4:30 p.m. Monday - Friday. Please note that voicemails left after 4:00 p.m. may not be returned until the following business day.  We are closed weekends and major holidays. You have access to a nurse at all times for urgent questions. Please call the main number to the clinic Dept: [phone] and follow the prompts.   For any non-urgent questions, you may also contact your provider using MyChart. We now offer e-Visits for anyone 81 and older to request care online for non-urgent symptoms. For details visit [email or web address].   Also download the MyChart app! Go to the app store, search "MyChart", open the app, select [place], and log in with your MyChart username and password.  Durvalumab Injection What is this medication? DURVALUMAB (dur VAL ue mab) treats some types of cancer. It works by helping your immune system slow or stop the spread of cancer cells. It is a monoclonal antibody. This medicine may be used for other purposes; ask your health care provider or pharmacist if you have questions. COMMON BRAND NAME(S): IMFINZI What should I tell my care team before I take this medication? They need to know if you have any of these conditions: Allogeneic stem cell transplant (uses someone else's stem cells) Autoimmune diseases, such as  Crohn disease, ulcerative colitis, lupus History of chest radiation Nervous system problems, such as Guillain-Barre syndrome, myasthenia gravis Organ transplant An unusual or allergic reaction to durvalumab, other medications, foods, dyes, or preservatives Pregnant or trying to get pregnant Breast-feeding How should I use this  medication? This medication is infused into a vein. It is given by your care team in a hospital or clinic setting. A special MedGuide will be given to you before each treatment. Be sure to read this information carefully each time. Talk to your care team about the use of this medication in children. Special care may be needed. Overdosage: If you think you have taken too much of this medicine contact a poison control center or emergency room at once. NOTE: This medicine is only for you. Do not share this medicine with others. What if I miss a dose? Keep appointments for follow-up doses. It is important not to miss your dose. Call your care team if you are unable to keep an appointment. What may interact with this medication? Interactions have not been studied. This list may not describe all possible interactions. Give your health care provider a list of all the medicines, herbs, non-prescription drugs, or dietary supplements you use. Also tell them if you smoke, drink alcohol, or use illegal drugs. Some items may interact with your medicine. What should I watch for while using this medication? Your condition will be monitored carefully while you are receiving this medication. You may need blood work while taking this medication. This medication may cause serious skin reactions. They can happen weeks to months after starting the medication. Contact your care team right away if you notice fevers or flu-like symptoms with a rash. The rash may be red or purple and then turn into blisters or peeling of the skin. You may also notice a red rash with swelling of the face, lips, or lymph nodes in your neck or under your arms. Tell your care team right away if you have any change in your eyesight. Talk to your care team if you may be pregnant. Serious birth defects can occur if you take this medication during pregnancy and for 3 months after the last dose. You will need a negative pregnancy test before starting  this medication. Contraception is recommended while taking this medication and for 3 months after the last dose. Your care team can help you find the option that works for you. Do not breastfeed while taking this medication and for 3 months after the last dose. What side effects may I notice from receiving this medication? Side effects that you should report to your care team as soon as possible: Allergic reactions--skin rash, itching, hives, swelling of the face, lips, tongue, or throat Dry cough, shortness of breath or trouble breathing Eye pain, redness, irritation, or discharge with blurry or decreased vision Heart muscle inflammation--unusual weakness or fatigue, shortness of breath, chest pain, fast or irregular heartbeat, dizziness, swelling of the ankles, feet, or hands Hormone gland problems--headache, sensitivity to light, unusual weakness or fatigue, dizziness, fast or irregular heartbeat, increased sensitivity to cold or heat, excessive sweating, constipation, hair loss, increased thirst or amount of urine, tremors or shaking, irritability Infusion reactions--chest pain, shortness of breath or trouble breathing, feeling faint or lightheaded Kidney injury (glomerulonephritis)--decrease in the amount of urine, red or dark brown urine, foamy or bubbly urine, swelling of the ankles, hands, or feet Liver injury--right upper belly pain, loss of appetite, nausea, light-colored stool, dark yellow or brown urine, yellowing  skin or eyes, unusual weakness or fatigue Pain, tingling, or numbness in the hands or feet, muscle weakness, change in vision, confusion or trouble speaking, loss of balance or coordination, trouble walking, seizures Rash, fever, and swollen lymph nodes Redness, blistering, peeling, or loosening of the skin, including inside the mouth Sudden or severe stomach pain, bloody diarrhea, fever, nausea, vomiting Side effects that usually do not require medical attention (report these  to your care team if they continue or are bothersome): Bone, joint, or muscle pain Diarrhea Fatigue Loss of appetite Nausea Skin rash This list may not describe all possible side effects. Call your doctor for medical advice about side effects. You may report side effects to FDA at 1-800-FDA-1088. Where should I keep my medication? This medication is given in a hospital or clinic. It will not be stored at home. NOTE: This sheet is a summary. It may not cover all possible information. If you have questions about this medicine, talk to your doctor, pharmacist, or health care provider.  2024 Elsevier/Gold Standard (2021-06-29 00:00:00)

## 2023-06-30 ENCOUNTER — Ambulatory Visit: Admitting: Urology

## 2023-06-30 LAB — T4: T4, Total: 3.1 ug/dL — ABNORMAL LOW (ref 4.5–12.0)

## 2023-07-03 ENCOUNTER — Inpatient Hospital Stay

## 2023-07-03 NOTE — Progress Notes (Signed)
 [facility] Clinical Social Work  Clinical Social Work was referred by medical provider for assessment of psychosocial needs support.  Clinical Social Worker contacted patient by phone to offer support and assess for needs. Patient denied any needs and briefly mentioned spouse may need support. CSW explained to patient's spouse supports available. CSW and patient spouse scheduled to meet to further discuss needs and determine most appropriate resource.    Maudie Sorrow, LCSW  Clinical Social Worker [facility]

## 2023-07-04 ENCOUNTER — Telehealth: Payer: Self-pay | Admitting: Pulmonary Disease

## 2023-07-04 NOTE — Telephone Encounter (Signed)
 Cmn received for cpap/bipap supplies from [facility].

## 2023-07-05 ENCOUNTER — Ambulatory Visit: Payer: Managed Care, Other (non HMO) | Admitting: Dermatology

## 2023-07-06 NOTE — Telephone Encounter (Signed)
 CMN faxed successfully and signed.

## 2023-07-20 ENCOUNTER — Other Ambulatory Visit: Payer: Self-pay | Admitting: Oncology

## 2023-07-25 ENCOUNTER — Telehealth: Payer: Self-pay | Admitting: Primary Care

## 2023-07-25 DIAGNOSIS — G4733 Obstructive sleep apnea (adult) (pediatric): Secondary | ICD-10-CM | POA: Diagnosis not present

## 2023-07-25 NOTE — Telephone Encounter (Signed)
 Attempted to call patient to get scheduled for a cpap follow up between June 27 and August 25. Left message for patient to give our office a call back.

## 2023-07-27 ENCOUNTER — Inpatient Hospital Stay

## 2023-07-27 ENCOUNTER — Encounter: Payer: Self-pay | Admitting: Nurse Practitioner

## 2023-07-27 ENCOUNTER — Inpatient Hospital Stay (HOSPITAL_BASED_OUTPATIENT_CLINIC_OR_DEPARTMENT_OTHER): Admitting: Nurse Practitioner

## 2023-07-27 ENCOUNTER — Other Ambulatory Visit (HOSPITAL_BASED_OUTPATIENT_CLINIC_OR_DEPARTMENT_OTHER): Payer: Self-pay

## 2023-07-27 VITALS — BP 133/92 | HR 61 | Temp 98.1°F | Resp 18 | Ht 74.0 in | Wt 231.4 lb

## 2023-07-27 DIAGNOSIS — E032 Hypothyroidism due to medicaments and other exogenous substances: Secondary | ICD-10-CM

## 2023-07-27 DIAGNOSIS — C221 Intrahepatic bile duct carcinoma: Secondary | ICD-10-CM | POA: Diagnosis not present

## 2023-07-27 DIAGNOSIS — Z95828 Presence of other vascular implants and grafts: Secondary | ICD-10-CM

## 2023-07-27 DIAGNOSIS — Z5112 Encounter for antineoplastic immunotherapy: Secondary | ICD-10-CM | POA: Diagnosis not present

## 2023-07-27 LAB — CMP (CANCER CENTER ONLY)
ALT: 17 U/L (ref 0–44)
AST: 25 U/L (ref 15–41)
Albumin: 4.3 g/dL (ref 3.5–5.0)
Alkaline Phosphatase: 83 U/L (ref 38–126)
Anion gap: 11 (ref 5–15)
BUN: 17 mg/dL (ref 6–20)
CO2: 24 mmol/L (ref 22–32)
Calcium: 9.5 mg/dL (ref 8.9–10.3)
Chloride: 106 mmol/L (ref 98–111)
Creatinine: 1.12 mg/dL (ref 0.61–1.24)
GFR, Estimated: >60 mL/min (ref >60)
Glucose, Bld: 93 mg/dL (ref 70–99)
Potassium: 3.9 mmol/L (ref 3.5–5.1)
Sodium: 140 mmol/L (ref 135–145)
Total Bilirubin: 0.5 mg/dL (ref 0.0–1.2)
Total Protein: 7 g/dL (ref 6.5–8.1)

## 2023-07-27 LAB — CBC WITH DIFFERENTIAL (CANCER CENTER ONLY)
Abs Immature Granulocytes: 0.02 10*3/uL (ref 0.00–0.07)
Basophils Absolute: 0 10*3/uL (ref 0.0–0.1)
Basophils Relative: 1 %
Eosinophils Absolute: 0.4 10*3/uL (ref 0.0–0.5)
Eosinophils Relative: 6 %
HCT: 39.8 % (ref 39.0–52.0)
Hemoglobin: 13.5 g/dL (ref 13.0–17.0)
Immature Granulocytes: 0 %
Lymphocytes Relative: 33 %
Lymphs Abs: 2.1 10*3/uL (ref 0.7–4.0)
MCH: 29.4 pg (ref 26.0–34.0)
MCHC: 33.9 g/dL (ref 30.0–36.0)
MCV: 86.7 fL (ref 80.0–100.0)
Monocytes Absolute: 0.4 10*3/uL (ref 0.1–1.0)
Monocytes Relative: 6 %
Neutro Abs: 3.4 10*3/uL (ref 1.7–7.7)
Neutrophils Relative %: 54 %
Platelet Count: 158 10*3/uL (ref 150–400)
RBC: 4.59 MIL/uL (ref 4.22–5.81)
RDW: 13.1 % (ref 11.5–15.5)
WBC Count: 6.3 10*3/uL (ref 4.0–10.5)
nRBC: 0 % (ref 0.0–0.2)

## 2023-07-27 LAB — TSH: TSH: 36.5 u[IU]/mL — ABNORMAL HIGH (ref 0.350–4.500)

## 2023-07-27 MED ORDER — SODIUM CHLORIDE 0.9 % IV SOLN
1500.0000 mg | Freq: Once | INTRAVENOUS | Status: AC
  Administered 2023-07-27: 1500 mg via INTRAVENOUS
  Filled 2023-07-27: qty 30

## 2023-07-27 MED ORDER — LEVOTHYROXINE SODIUM 50 MCG PO TABS
50.0000 ug | ORAL_TABLET | Freq: Every day | ORAL | 3 refills | Status: DC
  Filled 2023-07-27: qty 30, 30d supply, fill #0
  Filled 2023-08-21: qty 30, 30d supply, fill #1
  Filled 2023-09-20: qty 30, 30d supply, fill #2

## 2023-07-27 MED ORDER — SODIUM CHLORIDE 0.9% FLUSH
10.0000 mL | Freq: Once | INTRAVENOUS | Status: AC
  Administered 2023-07-27: 10 mL via INTRAVENOUS

## 2023-07-27 MED ORDER — SODIUM CHLORIDE 0.9% FLUSH
10.0000 mL | INTRAVENOUS | Status: DC | PRN
  Administered 2023-07-27: 10 mL

## 2023-07-27 MED ORDER — HEPARIN SOD (PORK) LOCK FLUSH 100 UNIT/ML IV SOLN
500.0000 [IU] | Freq: Once | INTRAVENOUS | Status: AC | PRN
  Administered 2023-07-27: 500 [IU]

## 2023-07-27 MED ORDER — ESOMEPRAZOLE MAGNESIUM 20 MG PO CPDR
20.0000 mg | DELAYED_RELEASE_CAPSULE | Freq: Every morning | ORAL | 1 refills | Status: DC
  Filled 2023-07-27: qty 30, 30d supply, fill #0
  Filled 2023-08-21: qty 30, 30d supply, fill #1
  Filled 2023-09-20: qty 30, 30d supply, fill #2

## 2023-07-27 MED ORDER — SODIUM CHLORIDE 0.9 % IV SOLN: Freq: Once | INTRAVENOUS | Status: AC

## 2023-07-27 MED ORDER — AMLODIPINE BESYLATE 5 MG PO TABS
5.0000 mg | ORAL_TABLET | Freq: Every day | ORAL | 2 refills | Status: DC
  Filled 2023-07-27: qty 30, 30d supply, fill #0
  Filled 2023-08-21: qty 30, 30d supply, fill #1
  Filled 2023-09-20: qty 30, 30d supply, fill #2

## 2023-07-27 NOTE — Patient Instructions (Signed)
 [facility] - A DEPT OF [facility]   Discharge Instructions: Thank you for choosing [facility] to provide your oncology and hematology care.   If you have a lab appointment with the Cancer Center, please go directly to the Cancer Center and check in at the registration area.   Wear comfortable clothing and clothing appropriate for easy access to any Portacath or PICC line.   We strive to give you quality time with your provider. You may need to reschedule your appointment if you arrive late (15 or more minutes).  Arriving late affects you and other patients whose appointments are after yours.  Also, if you miss three or more appointments without notifying the office, you may be dismissed from the clinic at the provider's discretion.      For prescription refill requests, have your pharmacy contact our office and allow 72 hours for refills to be completed.    Today you received the following chemotherapy and/or immunotherapy agents IMFINZI      To help prevent nausea and vomiting after your treatment, we encourage you to take your nausea medication as directed.  BELOW ARE SYMPTOMS THAT SHOULD BE REPORTED IMMEDIATELY: *FEVER GREATER THAN 100.4 F (38 C) OR HIGHER *CHILLS OR SWEATING *NAUSEA AND VOMITING THAT IS NOT CONTROLLED WITH YOUR NAUSEA MEDICATION *UNUSUAL SHORTNESS OF BREATH *UNUSUAL BRUISING OR BLEEDING *URINARY PROBLEMS (pain or burning when urinating, or frequent urination) *BOWEL PROBLEMS (unusual diarrhea, constipation, pain near the anus) TENDERNESS IN MOUTH AND THROAT WITH OR WITHOUT PRESENCE OF ULCERS (sore throat, sores in mouth, or a toothache) UNUSUAL RASH, SWELLING OR PAIN  UNUSUAL VAGINAL DISCHARGE OR ITCHING   Items with * indicate a potential emergency and should be followed up as soon as possible or go to the Emergency Department if any problems should occur.  Please show the CHEMOTHERAPY ALERT CARD or IMMUNOTHERAPY  ALERT CARD at check-in to the Emergency Department and triage nurse.  Should you have questions after your visit or need to cancel or reschedule your appointment, please contact [facility] - A DEPT OF [facility]  Dept: [phone]  and follow the prompts.  Office hours are 8:00 a.m. to 4:30 p.m. Monday - Friday. Please note that voicemails left after 4:00 p.m. may not be returned until the following business day.  We are closed weekends and major holidays. You have access to a nurse at all times for urgent questions. Please call the main number to the clinic Dept: [phone] and follow the prompts.   For any non-urgent questions, you may also contact your provider using MyChart. We now offer e-Visits for anyone 81 and older to request care online for non-urgent symptoms. For details visit [email or web address].   Also download the MyChart app! Go to the app store, search "MyChart", open the app, select [place], and log in with your MyChart username and password.  Durvalumab Injection What is this medication? DURVALUMAB (dur VAL ue mab) treats some types of cancer. It works by helping your immune system slow or stop the spread of cancer cells. It is a monoclonal antibody. This medicine may be used for other purposes; ask your health care provider or pharmacist if you have questions. COMMON BRAND NAME(S): IMFINZI What should I tell my care team before I take this medication? They need to know if you have any of these conditions: Allogeneic stem cell transplant (uses someone else's stem cells) Autoimmune diseases, such as  Crohn disease, ulcerative colitis, lupus History of chest radiation Nervous system problems, such as Guillain-Barre syndrome, myasthenia gravis Organ transplant An unusual or allergic reaction to durvalumab, other medications, foods, dyes, or preservatives Pregnant or trying to get pregnant Breast-feeding How should I use this  medication? This medication is infused into a vein. It is given by your care team in a hospital or clinic setting. A special MedGuide will be given to you before each treatment. Be sure to read this information carefully each time. Talk to your care team about the use of this medication in children. Special care may be needed. Overdosage: If you think you have taken too much of this medicine contact a poison control center or emergency room at once. NOTE: This medicine is only for you. Do not share this medicine with others. What if I miss a dose? Keep appointments for follow-up doses. It is important not to miss your dose. Call your care team if you are unable to keep an appointment. What may interact with this medication? Interactions have not been studied. This list may not describe all possible interactions. Give your health care provider a list of all the medicines, herbs, non-prescription drugs, or dietary supplements you use. Also tell them if you smoke, drink alcohol, or use illegal drugs. Some items may interact with your medicine. What should I watch for while using this medication? Your condition will be monitored carefully while you are receiving this medication. You may need blood work while taking this medication. This medication may cause serious skin reactions. They can happen weeks to months after starting the medication. Contact your care team right away if you notice fevers or flu-like symptoms with a rash. The rash may be red or purple and then turn into blisters or peeling of the skin. You may also notice a red rash with swelling of the face, lips, or lymph nodes in your neck or under your arms. Tell your care team right away if you have any change in your eyesight. Talk to your care team if you may be pregnant. Serious birth defects can occur if you take this medication during pregnancy and for 3 months after the last dose. You will need a negative pregnancy test before starting  this medication. Contraception is recommended while taking this medication and for 3 months after the last dose. Your care team can help you find the option that works for you. Do not breastfeed while taking this medication and for 3 months after the last dose. What side effects may I notice from receiving this medication? Side effects that you should report to your care team as soon as possible: Allergic reactions--skin rash, itching, hives, swelling of the face, lips, tongue, or throat Dry cough, shortness of breath or trouble breathing Eye pain, redness, irritation, or discharge with blurry or decreased vision Heart muscle inflammation--unusual weakness or fatigue, shortness of breath, chest pain, fast or irregular heartbeat, dizziness, swelling of the ankles, feet, or hands Hormone gland problems--headache, sensitivity to light, unusual weakness or fatigue, dizziness, fast or irregular heartbeat, increased sensitivity to cold or heat, excessive sweating, constipation, hair loss, increased thirst or amount of urine, tremors or shaking, irritability Infusion reactions--chest pain, shortness of breath or trouble breathing, feeling faint or lightheaded Kidney injury (glomerulonephritis)--decrease in the amount of urine, red or dark brown urine, foamy or bubbly urine, swelling of the ankles, hands, or feet Liver injury--right upper belly pain, loss of appetite, nausea, light-colored stool, dark yellow or brown urine, yellowing  skin or eyes, unusual weakness or fatigue Pain, tingling, or numbness in the hands or feet, muscle weakness, change in vision, confusion or trouble speaking, loss of balance or coordination, trouble walking, seizures Rash, fever, and swollen lymph nodes Redness, blistering, peeling, or loosening of the skin, including inside the mouth Sudden or severe stomach pain, bloody diarrhea, fever, nausea, vomiting Side effects that usually do not require medical attention (report these  to your care team if they continue or are bothersome): Bone, joint, or muscle pain Diarrhea Fatigue Loss of appetite Nausea Skin rash This list may not describe all possible side effects. Call your doctor for medical advice about side effects. You may report side effects to FDA at 1-800-FDA-1088. Where should I keep my medication? This medication is given in a hospital or clinic. It will not be stored at home. NOTE: This sheet is a summary. It may not cover all possible information. If you have questions about this medicine, talk to your doctor, pharmacist, or health care provider.  2024 Elsevier/Gold Standard (2021-06-29 00:00:00)

## 2023-07-27 NOTE — Progress Notes (Unsigned)
 Patient seen by Diana Forster NP today  Vitals are within treatment parameters:No (Please specify and give further instructions.) B/P 133/92. Its ok to proceed  Labs are within treatment parameters: Yes   Treatment plan has been signed: Yes   Per physician team, Patient is ready for treatment and there are NO modifications to the treatment plan.

## 2023-07-27 NOTE — Patient Instructions (Signed)

## 2023-07-27 NOTE — Progress Notes (Unsigned)
 [facility] OFFICE PROGRESS NOTE   Diagnosis: Cholangiocarcinoma  INTERVAL HISTORY:   Jeffrey Blevins returns as scheduled.  He continues Durvalumab .  Overall feels well.  Good appetite.  No diarrhea or rash.  No nausea or vomiting.  No cough.  Occasional dyspnea.  Objective:  Vital signs in last 24 hours:  Blood pressure (!) 133/92, pulse 61, temperature 98.1 F (36.7 C), temperature source Temporal, resp. rate 18, height 6\' 2"  (1.88 m), weight 231 lb 6.4 oz (105 kg), SpO2 100%.    HEENT: No thrush or ulcers. Resp: Lungs clear bilaterally. Cardio: Regular rate and rhythm. GI: No hepatosplenomegaly.  Mild tenderness right upper abdomen. Vascular: No leg edema. Neuro: Alert and oriented. Skin: No rash. Port-A-Cath without erythema.   Lab Results:  Lab Results  Component Value Date   WBC 6.3 07/27/2023   HGB 13.5 07/27/2023   HCT 39.8 07/27/2023   MCV 86.7 07/27/2023   PLT 158 07/27/2023   NEUTROABS 3.4 07/27/2023    Imaging:  No results found.  Medications: I have reviewed the patient's current medications.  Assessment/Plan: Liver mass concerning for malignancy -CT abdomen/pelvis with contrast 08/14/1998 23-2.9 x 2.7 x 2.7 cm hypoenhancing mass in segment 5 near the liver hilum just above hepatic ductal confluence suspicious for primary bile duct malignancy or primary hepatic malignancy -MRCP 08/14/2021-irregular mass of segment 8 of the liver with perilesional enhancement concerning for cholangiocarcinoma -08/14/2021 CA 19.9 was elevated at 76 -08/16/2021 AFP 6.4 -08/16/2021 cytology from bile duct brushing showed atypical cells -08/19/2021 cytology from bile duct brushing showed cells suspicious for malignancy -08/25/2021 CT-guided liver biopsy-moderately differentiated adenocarcinoma, CK7 positive, CDX2 positive in a small population of cells, TTF-1 negative, CK20 negative-consistent with intrahepatic cholangiocarcinoma versus metastatic disease from a  pancreaticobiliary primary -CTs at [facility] 09/06/2021-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, occlusion of the anterior branch of the right portal vein, periportal enhancement and thickening on delayed imaging potentially due to tumor infiltration, no change in the 2 x 3.2 cm mass in segment 8, mild to moderate left and right intrahepatic biliary dilatation, biliary stent in place, small portacaval and periportal nodes, no evidence of metastatic disease in the chest -Cycle 1 gemcitabine /cisplatin /durvalumab  10/01/2021 -Cycle 2 gemcitabine /cisplatin /durvalumab  10/22/2021 -Cycle 3 gemcitabine /cisplatin /durvalumab  11/11/2021, D1 cisplatin  held due to neuropathy, cisplatin  resumed day 8 -Cycle 4 gemcitabine /cisplatin /durvalumab  12/03/2021 -MRI abdomen 12/15/2021-segment 8 cholangiocarcinoma no longer measurable, residual biliary duct dilation in the anterior right and left hepatic lobes, no evidence of metastatic disease persistent anterior right and new peripheral left portal vein occlusion -Cycle 5 gemcitabine /cisplatin /durvalumab  12/24/2021 -Cycle 6 gemcitabine /cisplatin /durvalumab  01/13/2022 -CTs at [facility] 02/14/2022-fine hypoattenuating masses in the left and right hepatic lobes, more prominent compared to the 12/15/2021 MRI, potentially representing multifocal cholangiocarcinoma, persistent occlusion of left portal and anterior branch of the right portal vein, no extrahepatic lymphadenopathy or metastatic disease -MRI liver 12/19 /2023-1.3 x 0.8 cm hypoenhancing lesion in segment 8, ill-defined area of delayed enhancement in the caudate measuring 1.7 cm, no left liver lesion, 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  Partial hepatectomy anatomy.  Marked improvement postsurgical fluid collections.  Biliary stent in place.  Mild biliary duct dilatation in the central hepatic lobes.  No evidence of metastatic disease in the abdomen/pelvis. -07/27/2022 continue every 4-week Durvalumab  -CTs 10/18/2022-partial left hepatectomy, common hepatic and bile duct stent unchanged with unchanged segmental intrahepatic biliary duct dilation in the remnant left lobe and anterior right lobe.  No evidence of metastatic disease in the chest, abdomen, or pelvis.  Unchanged 0.5 cm subpleural nodule in the right middle lobe -10/19/2022 continue every 4-week Durvalumab  -CTs 02/02/2023-no evidence of disease progression, surgical treatments at the anterior margin of the right liver, left hepatectomy, stable right middle lobe nodule -02/09/2023-every 4-week Durvalumab  continued 2.  Obstructive jaundice -08/16/2021 percutaneous biliary drain placement 3.  Hepatic steatosis 4.  GERD 5.  Hypertension 6.  Normocytic anemia 7.  Enlarged prostate seen on CT 8.  Tobacco use 9.  Enterococcus and Aerococcus bacteremia 08/20/2021-discharged 08/26/2021 to complete outpatient course of Augmentin  10.  Peripheral neuropathy 11.  Port-A-Cath placement 09/23/2021 12.  Biliary drain exchange 09/23/2021; drain capped 10/12/2021 13.  Rigors following biliary drain procedure 09/23/2021, biliary drain culture-Enterobacter Cloacae, Bactrim  x7 days 09/27/2021 14.  Right leg edema and pain 10/18/2021-Doppler negative for DVT 15.  Admission 03/18/2022 with a right upper quadrant abscess, status post drain placement, culture positive for Klebsiella CTs 03/21/2022-no change in moderate volume ascites, interval decrease in subhepatic air-filled collection with  catheter in appropriate position, resolved right pneumothorax, no change in bilateral pleural effusions-moderate on right and small on left CTs 04/25/2022 at [facility]-3.1 x 2.0 cm fluid collection in the abdomen and pelvis, decreased in volume and increased in organization; resolution of subhepatic collection; increased partially visualized large right pleural effusion with near complete collapse of the right lower lobe. Drainage catheter removed 04/25/2022 16.  Large right pleural effusion on CT 04/25/2022-thoracentesis 04/29/2022, 1.75 L of pleural fluid removed; culture negative, cytology with reactive mesothelial cells present, acute inflammation 17.  Hypothyroidism, likely secondary to Durvalumab , thyroid  hormone replacement beginning November 2024 18.  06/21/2023 prostate biopsies with benign prostatic tissue    Disposition: Mr. Seddon appears stable.  He continues Durvalumab  every 4 weeks.  He is tolerating well.  There is no clinical evidence of disease progression.  Plan to proceed with Durvalumab  today as scheduled.  Restaging CTs prior to next office visit.  CBC and chemistry panel reviewed.  Labs adequate for treatment.  He will return for follow-up in 4 weeks.    Jeffrey Blevins ANP/GNP-BC   07/27/2023  11:33 AM

## 2023-07-28 ENCOUNTER — Other Ambulatory Visit: Payer: 59

## 2023-07-28 ENCOUNTER — Encounter: Payer: Self-pay | Admitting: Oncology

## 2023-07-28 LAB — T4: T4, Total: 6.2 ug/dL (ref 4.5–12.0)

## 2023-07-31 ENCOUNTER — Telehealth: Payer: Self-pay | Admitting: Oncology

## 2023-07-31 NOTE — Telephone Encounter (Signed)
 Patient has been scheduled for follow-up visit per 07/31/23 LOS.  Pt aware of scheduled appt details.

## 2023-08-04 ENCOUNTER — Ambulatory Visit: Payer: 59 | Admitting: Urology

## 2023-08-12 ENCOUNTER — Ambulatory Visit (HOSPITAL_BASED_OUTPATIENT_CLINIC_OR_DEPARTMENT_OTHER)
Admission: RE | Admit: 2023-08-12 | Discharge: 2023-08-12 | Disposition: A | Discharge status: Home / Self Care | Source: Ambulatory Visit | Attending: Nurse Practitioner | Admitting: Nurse Practitioner

## 2023-08-12 DIAGNOSIS — E032 Hypothyroidism due to medicaments and other exogenous substances: Secondary | ICD-10-CM | POA: Insufficient documentation

## 2023-08-12 DIAGNOSIS — Q631 Lobulated, fused and horseshoe kidney: Secondary | ICD-10-CM | POA: Diagnosis not present

## 2023-08-12 DIAGNOSIS — C221 Intrahepatic bile duct carcinoma: Secondary | ICD-10-CM | POA: Insufficient documentation

## 2023-08-12 DIAGNOSIS — I700 Atherosclerosis of aorta: Secondary | ICD-10-CM | POA: Diagnosis not present

## 2023-08-12 MED ORDER — IOHEXOL 300 MG/ML  SOLN
100.0000 mL | Freq: Once | INTRAMUSCULAR | Status: AC | PRN
Start: 2023-08-12 — End: 2023-08-12
  Administered 2023-08-12: 100 mL via INTRAVENOUS

## 2023-08-20 ENCOUNTER — Other Ambulatory Visit: Payer: Self-pay | Admitting: Oncology

## 2023-08-24 ENCOUNTER — Encounter: Payer: Self-pay | Admitting: Oncology

## 2023-08-24 ENCOUNTER — Other Ambulatory Visit (HOSPITAL_BASED_OUTPATIENT_CLINIC_OR_DEPARTMENT_OTHER): Payer: Self-pay

## 2023-08-24 ENCOUNTER — Inpatient Hospital Stay

## 2023-08-24 ENCOUNTER — Inpatient Hospital Stay: Attending: Oncology | Admitting: Oncology

## 2023-08-24 ENCOUNTER — Other Ambulatory Visit: Payer: Self-pay | Admitting: Urology

## 2023-08-24 VITALS — BP 144/84 | HR 52 | Resp 18

## 2023-08-24 VITALS — BP 133/82 | HR 60 | Temp 98.1°F | Resp 18 | Ht 74.0 in | Wt 233.2 lb

## 2023-08-24 DIAGNOSIS — C221 Intrahepatic bile duct carcinoma: Secondary | ICD-10-CM | POA: Insufficient documentation

## 2023-08-24 DIAGNOSIS — Z79890 Hormone replacement therapy (postmenopausal): Secondary | ICD-10-CM | POA: Insufficient documentation

## 2023-08-24 DIAGNOSIS — Z5112 Encounter for antineoplastic immunotherapy: Secondary | ICD-10-CM | POA: Insufficient documentation

## 2023-08-24 DIAGNOSIS — Z79620 Long term (current) use of immunosuppressive biologic: Secondary | ICD-10-CM | POA: Insufficient documentation

## 2023-08-24 LAB — CBC WITH DIFFERENTIAL (CANCER CENTER ONLY)
Abs Immature Granulocytes: 0.02 10*3/uL (ref 0.00–0.07)
Basophils Absolute: 0 10*3/uL (ref 0.0–0.1)
Basophils Relative: 0 %
Eosinophils Absolute: 0.4 10*3/uL (ref 0.0–0.5)
Eosinophils Relative: 5 %
HCT: 41 % (ref 39.0–52.0)
Hemoglobin: 14.1 g/dL (ref 13.0–17.0)
Immature Granulocytes: 0 %
Lymphocytes Relative: 27 %
Lymphs Abs: 1.8 10*3/uL (ref 0.7–4.0)
MCH: 29.8 pg (ref 26.0–34.0)
MCHC: 34.4 g/dL (ref 30.0–36.0)
MCV: 86.7 fL (ref 80.0–100.0)
Monocytes Absolute: 0.2 10*3/uL (ref 0.1–1.0)
Monocytes Relative: 4 %
Neutro Abs: 4.3 10*3/uL (ref 1.7–7.7)
Neutrophils Relative %: 64 %
Platelet Count: 163 10*3/uL (ref 150–400)
RBC: 4.73 MIL/uL (ref 4.22–5.81)
RDW: 13.2 % (ref 11.5–15.5)
WBC Count: 6.8 10*3/uL (ref 4.0–10.5)
nRBC: 0 % (ref 0.0–0.2)

## 2023-08-24 LAB — CMP (CANCER CENTER ONLY)
ALT: 18 U/L (ref 0–44)
AST: 26 U/L (ref 15–41)
Albumin: 4.2 g/dL (ref 3.5–5.0)
Alkaline Phosphatase: 84 U/L (ref 38–126)
Anion gap: 8 (ref 5–15)
BUN: 16 mg/dL (ref 6–20)
CO2: 27 mmol/L (ref 22–32)
Calcium: 9.8 mg/dL (ref 8.9–10.3)
Chloride: 104 mmol/L (ref 98–111)
Creatinine: 1.19 mg/dL (ref 0.61–1.24)
GFR, Estimated: >60 mL/min (ref >60)
Glucose, Bld: 131 mg/dL — ABNORMAL HIGH (ref 70–99)
Potassium: 3.8 mmol/L (ref 3.5–5.1)
Sodium: 139 mmol/L (ref 135–145)
Total Bilirubin: 0.5 mg/dL (ref 0.0–1.2)
Total Protein: 6.8 g/dL (ref 6.5–8.1)

## 2023-08-24 LAB — TSH: TSH: 42.9 u[IU]/mL — ABNORMAL HIGH (ref 0.350–4.500)

## 2023-08-24 MED ORDER — SODIUM CHLORIDE 0.9 % IV SOLN: Freq: Once | INTRAVENOUS | Status: AC

## 2023-08-24 MED ORDER — SODIUM CHLORIDE 0.9 % IV SOLN
1500.0000 mg | Freq: Once | INTRAVENOUS | Status: AC
  Administered 2023-08-24: 1500 mg via INTRAVENOUS
  Filled 2023-08-24: qty 30

## 2023-08-24 MED ORDER — HEPARIN SOD (PORK) LOCK FLUSH 100 UNIT/ML IV SOLN
500.0000 [IU] | Freq: Once | INTRAVENOUS | Status: AC | PRN
Start: 2023-08-24 — End: 2023-08-24
  Administered 2023-08-24: 500 [IU]

## 2023-08-24 MED ORDER — SODIUM CHLORIDE 0.9% FLUSH
10.0000 mL | INTRAVENOUS | Status: DC | PRN
Start: 2023-08-24 — End: 2023-08-24
  Administered 2023-08-24: 10 mL

## 2023-08-24 MED ORDER — TADALAFIL 20 MG PO TABS
20.0000 mg | ORAL_TABLET | Freq: Every evening | ORAL | 5 refills | Status: AC | PRN
  Filled 2023-08-24 – 2023-09-20 (×2): qty 10, 10d supply, fill #0
  Filled 2023-11-29: qty 10, 10d supply, fill #1
  Filled 2024-01-12: qty 10, 10d supply, fill #2
  Filled 2024-03-15: qty 10, 10d supply, fill #3

## 2023-08-24 NOTE — Patient Instructions (Signed)
 [facility] - A DEPT OF [facility]   Discharge Instructions: Thank you for choosing [facility] to provide your oncology and hematology care.   If you have a lab appointment with the Cancer Center, please go directly to the Cancer Center and check in at the registration area.   Wear comfortable clothing and clothing appropriate for easy access to any Portacath or PICC line.   We strive to give you quality time with your provider. You may need to reschedule your appointment if you arrive late (15 or more minutes).  Arriving late affects you and other patients whose appointments are after yours.  Also, if you miss three or more appointments without notifying the office, you may be dismissed from the clinic at the provider's discretion.      For prescription refill requests, have your pharmacy contact our office and allow 72 hours for refills to be completed.    Today you received the following chemotherapy and/or immunotherapy agents Durvalumab (IMFINZI).      To help prevent nausea and vomiting after your treatment, we encourage you to take your nausea medication as directed.  BELOW ARE SYMPTOMS THAT SHOULD BE REPORTED IMMEDIATELY: *FEVER GREATER THAN 100.4 F (38 C) OR HIGHER *CHILLS OR SWEATING *NAUSEA AND VOMITING THAT IS NOT CONTROLLED WITH YOUR NAUSEA MEDICATION *UNUSUAL SHORTNESS OF BREATH *UNUSUAL BRUISING OR BLEEDING *URINARY PROBLEMS (pain or burning when urinating, or frequent urination) *BOWEL PROBLEMS (unusual diarrhea, constipation, pain near the anus) TENDERNESS IN MOUTH AND THROAT WITH OR WITHOUT PRESENCE OF ULCERS (sore throat, sores in mouth, or a toothache) UNUSUAL RASH, SWELLING OR PAIN  UNUSUAL VAGINAL DISCHARGE OR ITCHING   Items with * indicate a potential emergency and should be followed up as soon as possible or go to the Emergency Department if any problems should occur.  Please show the CHEMOTHERAPY ALERT CARD or  IMMUNOTHERAPY ALERT CARD at check-in to the Emergency Department and triage nurse.  Should you have questions after your visit or need to cancel or reschedule your appointment, please contact [facility] - A DEPT OF [facility]  Dept: [phone]  and follow the prompts.  Office hours are 8:00 a.m. to 4:30 p.m. Monday - Friday. Please note that voicemails left after 4:00 p.m. may not be returned until the following business day.  We are closed weekends and major holidays. You have access to a nurse at all times for urgent questions. Please call the main number to the clinic Dept: [phone] and follow the prompts.   For any non-urgent questions, you may also contact your provider using MyChart. We now offer e-Visits for anyone 14 and older to request care online for non-urgent symptoms. For details visit [email or web address].   Also download the MyChart app! Go to the app store, search "MyChart", open the app, select [place], and log in with your MyChart username and password.  Durvalumab Injection What is this medication? DURVALUMAB (dur VAL ue mab) treats some types of cancer. It works by helping your immune system slow or stop the spread of cancer cells. It is a monoclonal antibody. This medicine may be used for other purposes; ask your health care provider or pharmacist if you have questions. COMMON BRAND NAME(S): IMFINZI What should I tell my care team before I take this medication? They need to know if you have any of these conditions: Allogeneic stem cell transplant (uses someone else's stem cells) Autoimmune diseases, such  as Crohn disease, ulcerative colitis, lupus History of chest radiation Nervous system problems, such as Guillain-Barre syndrome, myasthenia gravis Organ transplant An unusual or allergic reaction to durvalumab, other medications, foods, dyes, or preservatives Pregnant or trying to get pregnant Breast-feeding How should I use  this medication? This medication is infused into a vein. It is given by your care team in a hospital or clinic setting. A special MedGuide will be given to you before each treatment. Be sure to read this information carefully each time. Talk to your care team about the use of this medication in children. Special care may be needed. Overdosage: If you think you have taken too much of this medicine contact a poison control center or emergency room at once. NOTE: This medicine is only for you. Do not share this medicine with others. What if I miss a dose? Keep appointments for follow-up doses. It is important not to miss your dose. Call your care team if you are unable to keep an appointment. What may interact with this medication? Interactions have not been studied. This list may not describe all possible interactions. Give your health care provider a list of all the medicines, herbs, non-prescription drugs, or dietary supplements you use. Also tell them if you smoke, drink alcohol, or use illegal drugs. Some items may interact with your medicine. What should I watch for while using this medication? Your condition will be monitored carefully while you are receiving this medication. You may need blood work while taking this medication. This medication may cause serious skin reactions. They can happen weeks to months after starting the medication. Contact your care team right away if you notice fevers or flu-like symptoms with a rash. The rash may be red or purple and then turn into blisters or peeling of the skin. You may also notice a red rash with swelling of the face, lips, or lymph nodes in your neck or under your arms. Tell your care team right away if you have any change in your eyesight. Talk to your care team if you may be pregnant. Serious birth defects can occur if you take this medication during pregnancy and for 3 months after the last dose. You will need a negative pregnancy test before  starting this medication. Contraception is recommended while taking this medication and for 3 months after the last dose. Your care team can help you find the option that works for you. Do not breastfeed while taking this medication and for 3 months after the last dose. What side effects may I notice from receiving this medication? Side effects that you should report to your care team as soon as possible: Allergic reactions--skin rash, itching, hives, swelling of the face, lips, tongue, or throat Dry cough, shortness of breath or trouble breathing Eye pain, redness, irritation, or discharge with blurry or decreased vision Heart muscle inflammation--unusual weakness or fatigue, shortness of breath, chest pain, fast or irregular heartbeat, dizziness, swelling of the ankles, feet, or hands Hormone gland problems--headache, sensitivity to light, unusual weakness or fatigue, dizziness, fast or irregular heartbeat, increased sensitivity to cold or heat, excessive sweating, constipation, hair loss, increased thirst or amount of urine, tremors or shaking, irritability Infusion reactions--chest pain, shortness of breath or trouble breathing, feeling faint or lightheaded Kidney injury (glomerulonephritis)--decrease in the amount of urine, red or dark brown urine, foamy or bubbly urine, swelling of the ankles, hands, or feet Liver injury--right upper belly pain, loss of appetite, nausea, light-colored stool, dark yellow or brown urine,  yellowing skin or eyes, unusual weakness or fatigue Pain, tingling, or numbness in the hands or feet, muscle weakness, change in vision, confusion or trouble speaking, loss of balance or coordination, trouble walking, seizures Rash, fever, and swollen lymph nodes Redness, blistering, peeling, or loosening of the skin, including inside the mouth Sudden or severe stomach pain, bloody diarrhea, fever, nausea, vomiting Side effects that usually do not require medical attention  (report these to your care team if they continue or are bothersome): Bone, joint, or muscle pain Diarrhea Fatigue Loss of appetite Nausea Skin rash This list may not describe all possible side effects. Call your doctor for medical advice about side effects. You may report side effects to FDA at 1-800-FDA-1088. Where should I keep my medication? This medication is given in a hospital or clinic. It will not be stored at home. NOTE: This sheet is a summary. It may not cover all possible information. If you have questions about this medicine, talk to your doctor, pharmacist, or health care provider.  2024 Elsevier/Gold Standard (2021-06-29 00:00:00)

## 2023-08-24 NOTE — Progress Notes (Signed)
 Patient seen by Dr. Thornton Papas today  Vitals are within treatment parameters:Yes   Labs are within treatment parameters: Yes   Treatment plan has been signed: Yes   Per physician team, Patient is ready for treatment and there are NO modifications to the treatment plan.

## 2023-08-24 NOTE — Progress Notes (Signed)
 [facility] OFFICE PROGRESS NOTE   Diagnosis: Cholangiocarcinoma  INTERVAL HISTORY:   Jeffrey Blevins returns as scheduled.  He is last treated with durvalumab  07/27/2023.  No rash or diarrhea.  He is taking thyroid  hormone replacement.  Good appetite.  No complaint.  Objective:  Vital signs in last 24 hours:  Blood pressure 133/82, pulse 60, temperature 98.1 F (36.7 C), temperature source Temporal, resp. rate 18, height 6' 2 (1.88 m), weight 233 lb 3.2 oz (105.8 kg), SpO2 100%.    Lymphatics: No cervical, supraclavicular, axillary, or inguinal nodes Resp: Lungs clear bilaterally Cardio: Regular rate and rhythm GI: No mass, nontender, no hepatosplenomegaly Vascular: No leg edema   Portacath/PICC-without erythema  Lab Results:  Lab Results  Component Value Date   WBC 6.8 08/24/2023   HGB 14.1 08/24/2023   HCT 41.0 08/24/2023   MCV 86.7 08/24/2023   PLT 163 08/24/2023   NEUTROABS 4.3 08/24/2023    CMP  Lab Results  Component Value Date   NA 139 08/24/2023   K 3.8 08/24/2023   CL 104 08/24/2023   CO2 27 08/24/2023   GLUCOSE 131 (H) 08/24/2023   BUN 16 08/24/2023   CREATININE 1.19 08/24/2023   CALCIUM 9.8 08/24/2023   PROT 6.8 08/24/2023   ALBUMIN 4.2 08/24/2023   AST 26 08/24/2023   ALT 18 08/24/2023   ALKPHOS 84 08/24/2023   BILITOT 0.5 08/24/2023   GFRNONAA >60 08/24/2023    Lab Results  Component Value Date   RJW800 10 05/04/2023      Medications: I have reviewed the patient's current medications.   Assessment/Plan: Liver mass concerning for malignancy -CT abdomen/pelvis with contrast 08/14/1998 23-2.9 x 2.7 x 2.7 cm hypoenhancing mass in segment 5 near the liver hilum just above hepatic ductal confluence suspicious for primary bile duct malignancy or primary hepatic malignancy -MRCP 08/14/2021-irregular mass of segment 8 of the liver with perilesional enhancement concerning for cholangiocarcinoma -08/14/2021 CA 19.9 was elevated at  76 -08/16/2021 AFP 6.4 -08/16/2021 cytology from bile duct brushing showed atypical cells -08/19/2021 cytology from bile duct brushing showed cells suspicious for malignancy -08/25/2021 CT-guided liver biopsy-moderately differentiated adenocarcinoma, CK7 positive, CDX2 positive in a small population of cells, TTF-1 negative, CK20 negative-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 -CTs at [facility] 09/06/2021-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, occlusion of the anterior branch of the right portal vein, periportal enhancement and thickening on delayed imaging potentially due to tumor infiltration, no change in the 2 x 3.2 cm mass in segment 8, mild to moderate left and right intrahepatic biliary dilatation, biliary stent in place, small portacaval and periportal nodes, no evidence of metastatic disease in the chest -Cycle 1 gemcitabine /cisplatin /durvalumab  10/01/2021 -Cycle 2 gemcitabine /cisplatin /durvalumab  10/22/2021 -Cycle 3 gemcitabine /cisplatin /durvalumab  11/11/2021, D1 cisplatin  held due to neuropathy, cisplatin  resumed day 8 -Cycle 4 gemcitabine /cisplatin /durvalumab  12/03/2021 -MRI abdomen 12/15/2021-segment 8 cholangiocarcinoma no longer measurable, residual biliary duct dilation in the anterior right and left hepatic lobes, no evidence of metastatic disease persistent anterior right and new peripheral left portal vein occlusion -Cycle 5 gemcitabine /cisplatin /durvalumab  12/24/2021 -Cycle 6 gemcitabine /cisplatin /durvalumab  01/13/2022 -CTs at [facility] 02/14/2022-fine hypoattenuating masses in the left and right hepatic lobes, more prominent compared to the 12/15/2021 MRI, potentially representing multifocal cholangiocarcinoma, persistent occlusion of left portal and anterior branch of the right portal vein, no extrahepatic lymphadenopathy or metastatic disease -MRI liver 12/19 /2023-1.3 x 0.8 cm hypoenhancing lesion in segment 8, ill-defined  area of delayed enhancement in the caudate measuring 1.7 cm,  no left liver lesion, 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  Partial hepatectomy anatomy.  Marked improvement postsurgical fluid collections.  Biliary stent in place.  Mild biliary duct dilatation in the central hepatic lobes.  No evidence of metastatic disease in the abdomen/pelvis. -07/27/2022 continue every 4-week Durvalumab  -CTs 10/18/2022-partial left hepatectomy, common hepatic and bile duct stent unchanged with unchanged segmental intrahepatic biliary duct dilation in the remnant left lobe and anterior right lobe.  No evidence of metastatic disease in the chest, abdomen, or pelvis.  Unchanged 0.5 cm subpleural nodule in the right middle lobe -10/19/2022 continue every 4-week Durvalumab  -CTs 02/02/2023-no evidence of disease progression, surgical treatments at the anterior margin of the right liver, left hepatectomy, stable right middle lobe nodule -02/09/2023-every 4-week Durvalumab  continued CTs 08/12/2023-partial left hepatectomy, no evidence of recurrent disease unchanged 0.5 cm right middle lobe subpleural nodule 2.  Obstructive jaundice -08/16/2021 percutaneous biliary drain placement 3.  Hepatic steatosis 4.  GERD 5.  Hypertension 6.  Normocytic anemia 7.  Enlarged prostate seen on CT 8.  Tobacco use 9.  Enterococcus and Aerococcus bacteremia 08/20/2021-discharged 08/26/2021 to complete outpatient course of Augmentin  10.   Peripheral neuropathy 11.  Port-A-Cath placement 09/23/2021 12.  Biliary drain exchange 09/23/2021; drain capped 10/12/2021 13.  Rigors following biliary drain procedure 09/23/2021, biliary drain culture-Enterobacter Cloacae, Bactrim  x7 days 09/27/2021 14.  Right leg edema and pain 10/18/2021-Doppler negative for DVT 15.  Admission 03/18/2022 with a right upper quadrant abscess, status post drain placement, culture positive for Klebsiella CTs 03/21/2022-no change in moderate volume ascites, interval decrease in subhepatic air-filled collection with catheter in appropriate position, resolved right pneumothorax, no change in bilateral pleural effusions-moderate on right and small on left CTs 04/25/2022 at [facility]-3.1 x 2.0 cm fluid collection in the abdomen and pelvis, decreased in volume and increased in organization; resolution of subhepatic collection; increased partially visualized large right pleural effusion with near complete collapse of the right lower lobe. Drainage catheter removed 04/25/2022 16.  Large right pleural effusion on CT 04/25/2022-thoracentesis 04/29/2022, 1.75 L of pleural fluid removed; culture negative, cytology with reactive mesothelial cells present, acute inflammation 17.  Hypothyroidism, likely secondary to Durvalumab , thyroid  hormone replacement beginning November 2024 18.  06/21/2023 prostate biopsies with benign prostatic tissue     Disposition: Jeffrey Leavy is in clinical remission from cholangiocarcinoma.  He is tolerating the durvalumab  well.  I reviewed the restaging CT findings with him.  He continues thyroid  hormone replacement.  Will follow-up on the T4 level from today.  The plan is to continue durvalumab  for 2 years.  Jeffrey Hemmelgarn will complete another treatment with durvalumab  today.  He will return for an office visit and treatment in 1 month.  Jeffrey Hof, MD  08/24/2023  11:54 AM

## 2023-08-25 DIAGNOSIS — G4733 Obstructive sleep apnea (adult) (pediatric): Secondary | ICD-10-CM | POA: Diagnosis not present

## 2023-08-25 LAB — CANCER ANTIGEN 19-9: CA 19-9: 10 U/mL (ref 0–35)

## 2023-08-25 LAB — T4: T4, Total: 5.5 ug/dL (ref 4.5–12.0)

## 2023-09-13 ENCOUNTER — Telehealth: Payer: Self-pay | Admitting: Oncology

## 2023-09-13 NOTE — Telephone Encounter (Signed)
 Scheduled next series of appts with patient via phone

## 2023-09-20 ENCOUNTER — Encounter: Payer: Self-pay | Admitting: Oncology

## 2023-09-20 ENCOUNTER — Other Ambulatory Visit: Payer: Self-pay

## 2023-09-20 ENCOUNTER — Other Ambulatory Visit (HOSPITAL_BASED_OUTPATIENT_CLINIC_OR_DEPARTMENT_OTHER): Payer: Self-pay

## 2023-09-21 ENCOUNTER — Inpatient Hospital Stay

## 2023-09-21 ENCOUNTER — Inpatient Hospital Stay: Attending: Oncology

## 2023-09-21 ENCOUNTER — Inpatient Hospital Stay (HOSPITAL_BASED_OUTPATIENT_CLINIC_OR_DEPARTMENT_OTHER): Admitting: Oncology

## 2023-09-21 ENCOUNTER — Other Ambulatory Visit (HOSPITAL_BASED_OUTPATIENT_CLINIC_OR_DEPARTMENT_OTHER): Payer: Self-pay

## 2023-09-21 VITALS — BP 133/81 | HR 60 | Temp 98.0°F | Resp 18 | Ht 74.0 in | Wt 238.5 lb

## 2023-09-21 VITALS — BP 156/93 | HR 55 | Temp 97.3°F | Resp 18

## 2023-09-21 DIAGNOSIS — C221 Intrahepatic bile duct carcinoma: Secondary | ICD-10-CM

## 2023-09-21 DIAGNOSIS — Z79620 Long term (current) use of immunosuppressive biologic: Secondary | ICD-10-CM | POA: Diagnosis not present

## 2023-09-21 DIAGNOSIS — Z95828 Presence of other vascular implants and grafts: Secondary | ICD-10-CM

## 2023-09-21 DIAGNOSIS — Z5112 Encounter for antineoplastic immunotherapy: Secondary | ICD-10-CM | POA: Diagnosis not present

## 2023-09-21 LAB — CMP (CANCER CENTER ONLY)
ALT: 21 U/L (ref 0–44)
AST: 27 U/L (ref 15–41)
Albumin: 4.3 g/dL (ref 3.5–5.0)
Alkaline Phosphatase: 78 U/L (ref 38–126)
Anion gap: 11 (ref 5–15)
BUN: 17 mg/dL (ref 6–20)
CO2: 24 mmol/L (ref 22–32)
Calcium: 9.6 mg/dL (ref 8.9–10.3)
Chloride: 105 mmol/L (ref 98–111)
Creatinine: 1.11 mg/dL (ref 0.61–1.24)
GFR, Estimated: >60 mL/min (ref >60)
Glucose, Bld: 104 mg/dL — ABNORMAL HIGH (ref 70–99)
Potassium: 4.3 mmol/L (ref 3.5–5.1)
Sodium: 140 mmol/L (ref 135–145)
Total Bilirubin: 0.5 mg/dL (ref 0.0–1.2)
Total Protein: 6.8 g/dL (ref 6.5–8.1)

## 2023-09-21 LAB — CBC WITH DIFFERENTIAL (CANCER CENTER ONLY)
Abs Immature Granulocytes: 0.02 K/uL (ref 0.00–0.07)
Basophils Absolute: 0 K/uL (ref 0.0–0.1)
Basophils Relative: 1 %
Eosinophils Absolute: 0.4 K/uL (ref 0.0–0.5)
Eosinophils Relative: 5 %
HCT: 40.8 % (ref 39.0–52.0)
Hemoglobin: 13.8 g/dL (ref 13.0–17.0)
Immature Granulocytes: 0 %
Lymphocytes Relative: 31 %
Lymphs Abs: 2 K/uL (ref 0.7–4.0)
MCH: 29.8 pg (ref 26.0–34.0)
MCHC: 33.8 g/dL (ref 30.0–36.0)
MCV: 88.1 fL (ref 80.0–100.0)
Monocytes Absolute: 0.4 K/uL (ref 0.1–1.0)
Monocytes Relative: 7 %
Neutro Abs: 3.6 K/uL (ref 1.7–7.7)
Neutrophils Relative %: 56 %
Platelet Count: 158 K/uL (ref 150–400)
RBC: 4.63 MIL/uL (ref 4.22–5.81)
RDW: 13.4 % (ref 11.5–15.5)
WBC Count: 6.4 K/uL (ref 4.0–10.5)
nRBC: 0 % (ref 0.0–0.2)

## 2023-09-21 LAB — TSH: TSH: 27.4 u[IU]/mL — ABNORMAL HIGH (ref 0.350–4.500)

## 2023-09-21 MED ORDER — SODIUM CHLORIDE 0.9 % IV SOLN
Freq: Once | INTRAVENOUS | Status: AC
Start: 2023-09-21 — End: 2023-09-21

## 2023-09-21 MED ORDER — SODIUM CHLORIDE 0.9 % IV SOLN
1500.0000 mg | Freq: Once | INTRAVENOUS | Status: AC
  Administered 2023-09-21: 1500 mg via INTRAVENOUS
  Filled 2023-09-21: qty 30

## 2023-09-21 MED ORDER — HEPARIN SOD (PORK) LOCK FLUSH 100 UNIT/ML IV SOLN
500.0000 [IU] | Freq: Once | INTRAVENOUS | Status: AC | PRN
Start: 2023-09-21 — End: 2023-09-21
  Administered 2023-09-21: 500 [IU]

## 2023-09-21 MED ORDER — SODIUM CHLORIDE 0.9% FLUSH
10.0000 mL | INTRAVENOUS | Status: DC | PRN
Start: 2023-09-21 — End: 2023-09-21
  Administered 2023-09-21: 10 mL

## 2023-09-21 MED ORDER — SODIUM CHLORIDE 0.9% FLUSH
10.0000 mL | Freq: Once | INTRAVENOUS | Status: AC
  Administered 2023-09-21: 10 mL via INTRAVENOUS

## 2023-09-21 NOTE — Progress Notes (Signed)
 [facility] OFFICE PROGRESS NOTE   Diagnosis: Cholangiocarcinoma  INTERVAL HISTORY:   Jeffrey Blevins returns as scheduled.  He completed another treatment with durvalumab  on 08/24/2023.  No rash or diarrhea.  He feels well.  He has a tight/pulling feeling around the upper abdominal scar.  His appetite.  Objective:  Vital signs in last 24 hours:  Blood pressure 133/81, pulse 60, temperature 98 F (36.7 C), temperature source Temporal, resp. rate 18, height 6' 2 (1.88 m), weight 238 lb 8 oz (108.2 kg), SpO2 100%.    Resp: Lungs clear bilaterally Cardio: Regular rate and rhythm GI: No apparent ascites, no mass, no hepatosplenomegaly, nontender Vascular: No leg edema   Portacath/PICC-without erythema  Lab Results:  Lab Results  Component Value Date   WBC 6.4 09/21/2023   HGB 13.8 09/21/2023   HCT 40.8 09/21/2023   MCV 88.1 09/21/2023   PLT 158 09/21/2023   NEUTROABS 3.6 09/21/2023    CMP  Lab Results  Component Value Date   NA 140 09/21/2023   K 4.3 09/21/2023   CL 105 09/21/2023   CO2 24 09/21/2023   GLUCOSE 104 (H) 09/21/2023   BUN 17 09/21/2023   CREATININE 1.11 09/21/2023   CALCIUM 9.6 09/21/2023   PROT 6.8 09/21/2023   ALBUMIN 4.3 09/21/2023   AST 27 09/21/2023   ALT 21 09/21/2023   ALKPHOS 78 09/21/2023   BILITOT 0.5 09/21/2023   GFRNONAA >60 09/21/2023    Lab Results  Component Value Date   RJW800 10 08/24/2023    Lab Results  Component Value Date   INR 1.1 08/25/2021   LABPROT 13.9 08/25/2021    Imaging:  No results found.  Medications: I have reviewed the patient's current medications.   Assessment/Plan: Liver mass concerning for malignancy -CT abdomen/pelvis with contrast 08/14/1998 23-2.9 x 2.7 x 2.7 cm hypoenhancing mass in segment 5 near the liver hilum just above hepatic ductal confluence suspicious for primary bile duct malignancy or primary hepatic malignancy -MRCP 08/14/2021-irregular mass of segment 8 of the liver  with perilesional enhancement concerning for cholangiocarcinoma -08/14/2021 CA 19.9 was elevated at 76 -08/16/2021 AFP 6.4 -08/16/2021 cytology from bile duct brushing showed atypical cells -08/19/2021 cytology from bile duct brushing showed cells suspicious for malignancy -08/25/2021 CT-guided liver biopsy-moderately differentiated adenocarcinoma, CK7 positive, CDX2 positive in a small population of cells, TTF-1 negative, CK20 negative-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 -CTs at [facility] 09/06/2021-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, occlusion of the anterior branch of the right portal vein, periportal enhancement and thickening on delayed imaging potentially due to tumor infiltration, no change in the 2 x 3.2 cm mass in segment 8, mild to moderate left and right intrahepatic biliary dilatation, biliary stent in place, small portacaval and periportal nodes, no evidence of metastatic disease in the chest -Cycle 1 gemcitabine /cisplatin /durvalumab  10/01/2021 -Cycle 2 gemcitabine /cisplatin /durvalumab  10/22/2021 -Cycle 3 gemcitabine /cisplatin /durvalumab  11/11/2021, D1 cisplatin  held due to neuropathy, cisplatin  resumed day 8 -Cycle 4 gemcitabine /cisplatin /durvalumab  12/03/2021 -MRI abdomen 12/15/2021-segment 8 cholangiocarcinoma no longer measurable, residual biliary duct dilation in the anterior right and left hepatic lobes, no evidence of metastatic disease persistent anterior right and new peripheral left portal vein occlusion -Cycle 5 gemcitabine /cisplatin /durvalumab  12/24/2021 -Cycle 6 gemcitabine /cisplatin /durvalumab  01/13/2022 -CTs at [facility] 02/14/2022-fine hypoattenuating masses in the left and right hepatic lobes, more prominent compared to the 12/15/2021 MRI, potentially representing multifocal cholangiocarcinoma, persistent occlusion of left portal and anterior branch of the right portal vein, no extrahepatic lymphadenopathy or  metastatic  disease -MRI liver 12/19 /2023-1.3 x 0.8 cm hypoenhancing lesion in segment 8, ill-defined area of delayed enhancement in the caudate measuring 1.7 cm, no left liver lesion, 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  Partial hepatectomy anatomy.  Marked improvement postsurgical fluid collections.  Biliary stent in place.  Mild biliary duct dilatation in the central hepatic lobes.  No evidence of metastatic disease in the abdomen/pelvis. -07/27/2022 continue every 4-week Durvalumab  -CTs 10/18/2022-partial left hepatectomy, common hepatic and bile duct stent unchanged with unchanged segmental intrahepatic biliary duct dilation in the remnant left lobe and anterior right lobe.  No evidence of metastatic disease in the chest, abdomen, or pelvis.  Unchanged 0.5 cm subpleural nodule in the right middle lobe -10/19/2022 continue every 4-week Durvalumab  -CTs 02/02/2023-no evidence of disease progression, surgical treatments at the anterior margin of the right liver, left hepatectomy, stable right middle lobe nodule -02/09/2023-every 4-week Durvalumab  continued CTs 08/12/2023-partial left hepatectomy, no evidence of recurrent disease unchanged 0.5 cm right middle lobe subpleural nodule 2.  Obstructive jaundice -08/16/2021 percutaneous biliary drain placement 3.  Hepatic steatosis 4.  GERD 5.  Hypertension 6.  Normocytic anemia 7.  Enlarged prostate seen on CT 8.  Tobacco use 9.  Enterococcus and  Aerococcus bacteremia 08/20/2021-discharged 08/26/2021 to complete outpatient course of Augmentin  10.  Peripheral neuropathy 11.  Port-A-Cath placement 09/23/2021 12.  Biliary drain exchange 09/23/2021; drain capped 10/12/2021 13.  Rigors following biliary drain procedure 09/23/2021, biliary drain culture-Enterobacter Cloacae, Bactrim  x7 days 09/27/2021 14.  Right leg edema and pain 10/18/2021-Doppler negative for DVT 15.  Admission 03/18/2022 with a right upper quadrant abscess, status post drain placement, culture positive for Klebsiella CTs 03/21/2022-no change in moderate volume ascites, interval decrease in subhepatic air-filled collection with catheter in appropriate position, resolved right pneumothorax, no change in bilateral pleural effusions-moderate on right and small on left CTs 04/25/2022 at [facility]-3.1 x 2.0 cm fluid collection in the abdomen and pelvis, decreased in volume and increased in organization; resolution of subhepatic collection; increased partially visualized large right pleural effusion with near complete collapse of the right lower lobe. Drainage catheter removed 04/25/2022 16.  Large right pleural effusion on CT 04/25/2022-thoracentesis 04/29/2022, 1.75 L of pleural fluid removed; culture negative, cytology with reactive mesothelial cells present, acute inflammation 17.  Hypothyroidism, likely secondary to Durvalumab , thyroid  hormone replacement beginning November 2024 18.  06/21/2023 prostate biopsies with benign prostatic tissue      Disposition: Jeffrey Blevins appears stable.  He is in clinical remission from the cholangiocarcinoma.  Is tolerating the durvalumab  well.  We will follow-up on the thyroid  panel from today.  He will complete another treatment with durvalumab  today.  He will return for an office visit in Durvalumab  in 4 weeks.  I will consult with Dr. Barbaraann regarding management of the biliary stent.  Jeffrey Hof, MD  09/21/2023  12:28 PM

## 2023-09-21 NOTE — Patient Instructions (Signed)

## 2023-09-21 NOTE — Patient Instructions (Signed)
 [facility] - A DEPT OF [facility]   Discharge Instructions: Thank you for choosing [facility] to provide your oncology and hematology care.   If you have a lab appointment with the Cancer Center, please go directly to the Cancer Center and check in at the registration area.   Wear comfortable clothing and clothing appropriate for easy access to any Portacath or PICC line.   We strive to give you quality time with your provider. You may need to reschedule your appointment if you arrive late (15 or more minutes).  Arriving late affects you and other patients whose appointments are after yours.  Also, if you miss three or more appointments without notifying the office, you may be dismissed from the clinic at the provider's discretion.      For prescription refill requests, have your pharmacy contact our office and allow 72 hours for refills to be completed.    Today you received the following chemotherapy and/or immunotherapy agents Durvalumab (IMFINZI).      To help prevent nausea and vomiting after your treatment, we encourage you to take your nausea medication as directed.  BELOW ARE SYMPTOMS THAT SHOULD BE REPORTED IMMEDIATELY: *FEVER GREATER THAN 100.4 F (38 C) OR HIGHER *CHILLS OR SWEATING *NAUSEA AND VOMITING THAT IS NOT CONTROLLED WITH YOUR NAUSEA MEDICATION *UNUSUAL SHORTNESS OF BREATH *UNUSUAL BRUISING OR BLEEDING *URINARY PROBLEMS (pain or burning when urinating, or frequent urination) *BOWEL PROBLEMS (unusual diarrhea, constipation, pain near the anus) TENDERNESS IN MOUTH AND THROAT WITH OR WITHOUT PRESENCE OF ULCERS (sore throat, sores in mouth, or a toothache) UNUSUAL RASH, SWELLING OR PAIN  UNUSUAL VAGINAL DISCHARGE OR ITCHING   Items with * indicate a potential emergency and should be followed up as soon as possible or go to the Emergency Department if any problems should occur.  Please show the CHEMOTHERAPY ALERT CARD or  IMMUNOTHERAPY ALERT CARD at check-in to the Emergency Department and triage nurse.  Should you have questions after your visit or need to cancel or reschedule your appointment, please contact [facility] - A DEPT OF [facility]  Dept: [phone]  and follow the prompts.  Office hours are 8:00 a.m. to 4:30 p.m. Monday - Friday. Please note that voicemails left after 4:00 p.m. may not be returned until the following business day.  We are closed weekends and major holidays. You have access to a nurse at all times for urgent questions. Please call the main number to the clinic Dept: [phone] and follow the prompts.   For any non-urgent questions, you may also contact your provider using MyChart. We now offer e-Visits for anyone 14 and older to request care online for non-urgent symptoms. For details visit [email or web address].   Also download the MyChart app! Go to the app store, search "MyChart", open the app, select [place], and log in with your MyChart username and password.  Durvalumab Injection What is this medication? DURVALUMAB (dur VAL ue mab) treats some types of cancer. It works by helping your immune system slow or stop the spread of cancer cells. It is a monoclonal antibody. This medicine may be used for other purposes; ask your health care provider or pharmacist if you have questions. COMMON BRAND NAME(S): IMFINZI What should I tell my care team before I take this medication? They need to know if you have any of these conditions: Allogeneic stem cell transplant (uses someone else's stem cells) Autoimmune diseases, such  as Crohn disease, ulcerative colitis, lupus History of chest radiation Nervous system problems, such as Guillain-Barre syndrome, myasthenia gravis Organ transplant An unusual or allergic reaction to durvalumab, other medications, foods, dyes, or preservatives Pregnant or trying to get pregnant Breast-feeding How should I use  this medication? This medication is infused into a vein. It is given by your care team in a hospital or clinic setting. A special MedGuide will be given to you before each treatment. Be sure to read this information carefully each time. Talk to your care team about the use of this medication in children. Special care may be needed. Overdosage: If you think you have taken too much of this medicine contact a poison control center or emergency room at once. NOTE: This medicine is only for you. Do not share this medicine with others. What if I miss a dose? Keep appointments for follow-up doses. It is important not to miss your dose. Call your care team if you are unable to keep an appointment. What may interact with this medication? Interactions have not been studied. This list may not describe all possible interactions. Give your health care provider a list of all the medicines, herbs, non-prescription drugs, or dietary supplements you use. Also tell them if you smoke, drink alcohol, or use illegal drugs. Some items may interact with your medicine. What should I watch for while using this medication? Your condition will be monitored carefully while you are receiving this medication. You may need blood work while taking this medication. This medication may cause serious skin reactions. They can happen weeks to months after starting the medication. Contact your care team right away if you notice fevers or flu-like symptoms with a rash. The rash may be red or purple and then turn into blisters or peeling of the skin. You may also notice a red rash with swelling of the face, lips, or lymph nodes in your neck or under your arms. Tell your care team right away if you have any change in your eyesight. Talk to your care team if you may be pregnant. Serious birth defects can occur if you take this medication during pregnancy and for 3 months after the last dose. You will need a negative pregnancy test before  starting this medication. Contraception is recommended while taking this medication and for 3 months after the last dose. Your care team can help you find the option that works for you. Do not breastfeed while taking this medication and for 3 months after the last dose. What side effects may I notice from receiving this medication? Side effects that you should report to your care team as soon as possible: Allergic reactions--skin rash, itching, hives, swelling of the face, lips, tongue, or throat Dry cough, shortness of breath or trouble breathing Eye pain, redness, irritation, or discharge with blurry or decreased vision Heart muscle inflammation--unusual weakness or fatigue, shortness of breath, chest pain, fast or irregular heartbeat, dizziness, swelling of the ankles, feet, or hands Hormone gland problems--headache, sensitivity to light, unusual weakness or fatigue, dizziness, fast or irregular heartbeat, increased sensitivity to cold or heat, excessive sweating, constipation, hair loss, increased thirst or amount of urine, tremors or shaking, irritability Infusion reactions--chest pain, shortness of breath or trouble breathing, feeling faint or lightheaded Kidney injury (glomerulonephritis)--decrease in the amount of urine, red or dark brown urine, foamy or bubbly urine, swelling of the ankles, hands, or feet Liver injury--right upper belly pain, loss of appetite, nausea, light-colored stool, dark yellow or brown urine,  yellowing skin or eyes, unusual weakness or fatigue Pain, tingling, or numbness in the hands or feet, muscle weakness, change in vision, confusion or trouble speaking, loss of balance or coordination, trouble walking, seizures Rash, fever, and swollen lymph nodes Redness, blistering, peeling, or loosening of the skin, including inside the mouth Sudden or severe stomach pain, bloody diarrhea, fever, nausea, vomiting Side effects that usually do not require medical attention  (report these to your care team if they continue or are bothersome): Bone, joint, or muscle pain Diarrhea Fatigue Loss of appetite Nausea Skin rash This list may not describe all possible side effects. Call your doctor for medical advice about side effects. You may report side effects to FDA at 1-800-FDA-1088. Where should I keep my medication? This medication is given in a hospital or clinic. It will not be stored at home. NOTE: This sheet is a summary. It may not cover all possible information. If you have questions about this medicine, talk to your doctor, pharmacist, or health care provider.  2024 Elsevier/Gold Standard (2021-06-29 00:00:00)

## 2023-09-21 NOTE — Progress Notes (Signed)
 Patient seen by Dr. Thornton Papas today  Vitals are within treatment parameters:Yes   Labs are within treatment parameters: Yes   Treatment plan has been signed: Yes   Per physician team, Patient is ready for treatment and there are NO modifications to the treatment plan.

## 2023-09-22 ENCOUNTER — Telehealth: Payer: Self-pay | Admitting: *Deleted

## 2023-09-22 LAB — CANCER ANTIGEN 19-9: CA 19-9: 11 U/mL (ref 0–35)

## 2023-09-22 LAB — T4: T4, Total: 5.4 ug/dL (ref 4.5–12.0)

## 2023-09-22 NOTE — Telephone Encounter (Signed)
 LVM for patient to check Mychart

## 2023-09-24 DIAGNOSIS — G4733 Obstructive sleep apnea (adult) (pediatric): Secondary | ICD-10-CM | POA: Diagnosis not present

## 2023-10-02 ENCOUNTER — Ambulatory Visit (HOSPITAL_BASED_OUTPATIENT_CLINIC_OR_DEPARTMENT_OTHER): Payer: Managed Care, Other (non HMO) | Admitting: Family Medicine

## 2023-10-02 ENCOUNTER — Ambulatory Visit (INDEPENDENT_AMBULATORY_CARE_PROVIDER_SITE_OTHER): Admitting: Family Medicine

## 2023-10-02 ENCOUNTER — Encounter (HOSPITAL_BASED_OUTPATIENT_CLINIC_OR_DEPARTMENT_OTHER): Payer: Self-pay | Admitting: Family Medicine

## 2023-10-02 VITALS — BP 130/84 | HR 64 | Ht 74.0 in | Wt 237.0 lb

## 2023-10-02 DIAGNOSIS — G4733 Obstructive sleep apnea (adult) (pediatric): Secondary | ICD-10-CM | POA: Diagnosis not present

## 2023-10-02 DIAGNOSIS — I10 Essential (primary) hypertension: Secondary | ICD-10-CM

## 2023-10-02 DIAGNOSIS — E781 Pure hyperglyceridemia: Secondary | ICD-10-CM | POA: Insufficient documentation

## 2023-10-02 DIAGNOSIS — N401 Enlarged prostate with lower urinary tract symptoms: Secondary | ICD-10-CM | POA: Diagnosis not present

## 2023-10-02 DIAGNOSIS — R350 Frequency of micturition: Secondary | ICD-10-CM

## 2023-10-02 NOTE — Progress Notes (Signed)
 Subjective:   Jeffrey Blevins 30-Nov-1965 10/02/2023  Chief Complaint  Patient presents with   Medical Management of Chronic Issues    63-month follow up; denies any main concerns for today's visit.    HPI: Jeffrey Blevins presents today for re-assessment and management of chronic medical conditions. Patient has labs drawn frequently with Oncology due to infusions for cholangiocarcinoma.   BPH: Patient was previously referred to Urology in February for elevated PSA and symptoms of BPH. He underwent prostate biopsy in April which was benign. Patient states his symptoms have been stable. Denies worsening or urinary frequency or hesitancy. Reports improvement of nocturia with CPAP use. He has follow up in November with urology.  BPH status: Stable.     HYPERTENSION: Jeffrey Blevins presents for the medical management of hypertension.  Patient's current hypertension medication regimen is: Amlodipine  5mg   Patient is  currently taking prescribed medications for HTN.  Patient is  regularly keeping a check on BP at home.  Adhering to low sodium diet: yes Exercising Regularly: Yes Denies headache, dizziness, CP, SHOB, vision changes.   BP Readings from Last 3 Encounters:  10/02/23 130/84  09/21/23 (!) 156/93  09/21/23 133/81   SLEEP APNEA: Jeffrey Blevins presents for the medical management of OSA. He has noticed improvement in frequency of waking up with use of CPAP. States he is only waking up twice at night instead of 5-6x a night. He has had improved energy with CPAP. CPAP use:  yes Patient is compliant with CPAP use: yes Sleep quality with CPAP use: good, average   HYPERTRIGLYCERIDEMIA:  Jeffrey Blevins presents for the medical management of hyperlipidemia. LFTs are frequently monitored with oncology.  Patient's current HLD regimen is: Diet controlled.  Patient is not currently taking prescribed medications for HLD.  Adhering to heathy diet: Yes Exercising regularly: Yes   Lab Results   Component Value Date   CHOL 156 04/04/2023   HDL 35 (L) 04/04/2023   LDLCALC 78 04/04/2023   TRIG 260 (H) 04/04/2023   CHOLHDL 4.5 04/04/2023     The following portions of the patient's history were reviewed and updated as appropriate: past medical history, past surgical history, family history, social history, allergies, medications, and problem list.   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  Followed by Dr. Cloretta, [facility]ann [facility]   GERD (gastroesophageal reflux disease)    Hiatal hernia    History of Barrett's esophagus  per pt dx yrs ago, but with last egd none noted 05/ 2021   History of esophageal dilatation    per pt hx several times last one approx. 2009 then 05/ 2021  for stricture   Hypertension    followed by pcp   Raynauds phenomenon    Right hydrocele    Past Surgical History:  Procedure Laterality  Date   COLONOSCOPY WITH ESOPHAGOGASTRODUODENOSCOPY (EGD)  07/16/2019   last one   INGUINAL HERNIA REPAIR Bilateral    last one 1990s   IR CHOLANGIOGRAM EXISTING TUBE  10/12/2021   IR ENDOLUMINAL BX OF BILIARY TREE  08/19/2021   IR EXCHANGE BILIARY DRAIN  08/19/2021   IR EXCHANGE BILIARY DRAIN  09/23/2021   IR EXCHANGE BILIARY DRAIN  11/04/2021   IR IMAGING GUIDED PORT INSERTION  09/23/2021   IR INT EXT BILIARY DRAIN WITH CHOLANGIOGRAM  08/16/2021   SPERMATOCELECTOMY Right 10/09/2020   Procedure: SPERMATOCELECTOMY;  Surgeon: Nieves Cough, MD;  Location: [facility];  Service: Urology;  Laterality: Right;   Family History  Problem Relation Age of Onset   Breast cancer Mother    Stroke Father    Breast cancer Sister    Cardiomyopathy Brother        Cardiomegaly   Outpatient Medications Prior to Visit  Medication Sig Dispense Refill   amLODipine  (NORVASC ) 5 MG tablet Take 1 tablet (5 mg total) by mouth daily. 30 tablet 2   esomeprazole  (NEXIUM ) 20 MG capsule Take 1 capsule (20 mg total) by mouth every morning. (Patient taking differently: Take 20 mg by mouth daily as needed.) 90 capsule 1   ibuprofen  (ADVIL ) 200 MG tablet Take 400 mg by mouth 3 (three) times daily as needed for headache (pain).     levothyroxine  (SYNTHROID ) 50 MCG tablet Take 1 tablet (50 mcg total) by mouth daily before breakfast. 30 tablet 3   tadalafil  (CIALIS ) 20 MG tablet Take 1 tablet (20 mg total) by mouth at bedtime as needed. 10 tablet 5   Facility-Administered Medications Prior to Visit  Medication Dose Route Frequency Provider Last Rate Last Admin   magnesium  sulfate 2 GM/50ML IVPB            palonosetron  (ALOXI ) 0.25 MG/5ML injection            Allergies  Allergen Reactions   Codeine Nausea And Vomiting and Rash     ROS: A complete ROS was performed with pertinent positives/negatives noted in the HPI. The remainder of the ROS are negative.    Objective:   Today's Vitals   10/02/23  1430  BP: 130/84  Pulse: 64  SpO2: 98%  Weight: 237 lb (107.5 kg)  Height: 6' 2 (1.88 m)    Physical Exam   GENERAL: Well-appearing, in NAD. Well nourished.  SKIN: Pink, warm and dry.  Head: Normocephalic. NECK: Trachea midline. Full ROM w/o pain or tenderness.  EYES: Conjunctiva clear without exudates. EOMI, PERRL, no drainage present.  NOSE: Septum midline w/o deformity. Nares patent, mucosa pink and non-inflamed w/o drainage. No sinus tenderness.  RESPIRATORY: Chest wall symmetrical. Respirations even and non-labored. Breath sounds clear to auscultation bilaterally.  CARDIAC: S1, S2 present, regular rate and rhythm without murmur or gallops. Peripheral pulses 2+ bilaterally.  MSK: Muscle tone and strength appropriate for age. NEUROLOGIC: No motor or sensory deficits. Steady, even gait. C2-C12 intact.  PSYCH/MENTAL STATUS: Alert, oriented x 3. Cooperative, appropriate mood and affect.   Health Maintenance Due  Topic Date Due  Hepatitis B Vaccines (1 of 3 - 19+ 3-dose series) Never done   Zoster Vaccines- Shingrix (1 of 2) Never done   Pneumococcal Vaccine: 50+ Years (1 of 1 - PCV) Never done   COVID-19 Vaccine (2 - Janssen risk series) 08/14/2019    No results found for any visits on 10/02/23.  The 10-year ASCVD risk score (Arnett DK, et al., 2019) is: 9%     Assessment & Plan:  1. Primary hypertension (Primary) Controlled. Continue current regimen and continue monitoring BP.   2. OSA (obstructive sleep apnea) Stable. Continue management by Pulmonology. Discussed importance of wearing his CPAP nightly.   3. Benign prostatic hyperplasia with urinary frequency Stable. I discussed possible medications to help with nocturia. Patient declined at this time. Will keep scheduled Urology follow up.   4. Hypertriglyceridemia Will check fasting lipid profile with labs upcoming in 1 week. Recommend patient start Omega 3 Fish Oil to help lower risk of elevated triglycerides and  CVD.    No orders of the defined types were placed in this encounter.  Lab Orders  No laboratory test(s) ordered today    Return in about 6 months (around 04/03/2024) for ANNUAL PHYSICAL.    Patient to reach out to office if new, worrisome, or unresolved symptoms arise or if no improvement in patient's condition. Patient verbalized understanding and is agreeable to treatment plan. All questions answered to patient's satisfaction.    Thersia Schuyler [name], [place]

## 2023-10-02 NOTE — Patient Instructions (Signed)
 Please obtain fasting lipid panel to labs next week.

## 2023-10-15 ENCOUNTER — Other Ambulatory Visit: Payer: Self-pay | Admitting: Oncology

## 2023-10-19 ENCOUNTER — Inpatient Hospital Stay: Attending: Oncology

## 2023-10-19 ENCOUNTER — Inpatient Hospital Stay

## 2023-10-19 ENCOUNTER — Encounter: Payer: Self-pay | Admitting: Oncology

## 2023-10-19 ENCOUNTER — Other Ambulatory Visit (HOSPITAL_BASED_OUTPATIENT_CLINIC_OR_DEPARTMENT_OTHER): Payer: Self-pay

## 2023-10-19 ENCOUNTER — Encounter: Payer: Self-pay | Admitting: Nurse Practitioner

## 2023-10-19 ENCOUNTER — Inpatient Hospital Stay (HOSPITAL_BASED_OUTPATIENT_CLINIC_OR_DEPARTMENT_OTHER): Admitting: Nurse Practitioner

## 2023-10-19 ENCOUNTER — Other Ambulatory Visit: Payer: Self-pay | Admitting: Nurse Practitioner

## 2023-10-19 VITALS — BP 140/95 | HR 60 | Temp 98.1°F | Resp 18

## 2023-10-19 VITALS — BP 137/85 | HR 60 | Temp 97.8°F | Resp 18 | Ht 74.0 in | Wt 237.4 lb

## 2023-10-19 DIAGNOSIS — C221 Intrahepatic bile duct carcinoma: Secondary | ICD-10-CM

## 2023-10-19 DIAGNOSIS — Z95828 Presence of other vascular implants and grafts: Secondary | ICD-10-CM | POA: Insufficient documentation

## 2023-10-19 DIAGNOSIS — Z79620 Long term (current) use of immunosuppressive biologic: Secondary | ICD-10-CM | POA: Diagnosis not present

## 2023-10-19 DIAGNOSIS — Z5112 Encounter for antineoplastic immunotherapy: Secondary | ICD-10-CM | POA: Insufficient documentation

## 2023-10-19 DIAGNOSIS — E032 Hypothyroidism due to medicaments and other exogenous substances: Secondary | ICD-10-CM

## 2023-10-19 LAB — CBC WITH DIFFERENTIAL (CANCER CENTER ONLY)
Abs Immature Granulocytes: 0.01 K/uL (ref 0.00–0.07)
Basophils Absolute: 0 K/uL (ref 0.0–0.1)
Basophils Relative: 1 %
Eosinophils Absolute: 0.4 K/uL (ref 0.0–0.5)
Eosinophils Relative: 7 %
HCT: 40 % (ref 39.0–52.0)
Hemoglobin: 13.8 g/dL (ref 13.0–17.0)
Immature Granulocytes: 0 %
Lymphocytes Relative: 33 %
Lymphs Abs: 2.1 K/uL (ref 0.7–4.0)
MCH: 30.1 pg (ref 26.0–34.0)
MCHC: 34.5 g/dL (ref 30.0–36.0)
MCV: 87.1 fL (ref 80.0–100.0)
Monocytes Absolute: 0.5 K/uL (ref 0.1–1.0)
Monocytes Relative: 8 %
Neutro Abs: 3.3 K/uL (ref 1.7–7.7)
Neutrophils Relative %: 51 %
Platelet Count: 149 K/uL — ABNORMAL LOW (ref 150–400)
RBC: 4.59 MIL/uL (ref 4.22–5.81)
RDW: 13.1 % (ref 11.5–15.5)
WBC Count: 6.4 K/uL (ref 4.0–10.5)
nRBC: 0 % (ref 0.0–0.2)

## 2023-10-19 LAB — CMP (CANCER CENTER ONLY)
ALT: 21 U/L (ref 0–44)
AST: 26 U/L (ref 15–41)
Albumin: 4.4 g/dL (ref 3.5–5.0)
Alkaline Phosphatase: 81 U/L (ref 38–126)
Anion gap: 11 (ref 5–15)
BUN: 17 mg/dL (ref 6–20)
CO2: 25 mmol/L (ref 22–32)
Calcium: 10 mg/dL (ref 8.9–10.3)
Chloride: 104 mmol/L (ref 98–111)
Creatinine: 1.12 mg/dL (ref 0.61–1.24)
GFR, Estimated: >60 mL/min (ref >60)
Glucose, Bld: 89 mg/dL (ref 70–99)
Potassium: 4.3 mmol/L (ref 3.5–5.1)
Sodium: 139 mmol/L (ref 135–145)
Total Bilirubin: 0.8 mg/dL (ref 0.0–1.2)
Total Protein: 7 g/dL (ref 6.5–8.1)

## 2023-10-19 LAB — TSH: TSH: 28.2 u[IU]/mL — ABNORMAL HIGH (ref 0.350–4.500)

## 2023-10-19 MED ORDER — ESOMEPRAZOLE MAGNESIUM 20 MG PO CPDR
20.0000 mg | DELAYED_RELEASE_CAPSULE | Freq: Every day | ORAL | 1 refills | Status: AC | PRN
  Filled 2023-10-19: qty 30, 30d supply, fill #0
  Filled 2023-11-29: qty 30, 30d supply, fill #1
  Filled 2024-01-12: qty 30, 30d supply, fill #2
  Filled 2024-02-06: qty 30, 30d supply, fill #3
  Filled 2024-03-15: qty 30, 30d supply, fill #4

## 2023-10-19 MED ORDER — SODIUM CHLORIDE 0.9% FLUSH
10.0000 mL | Freq: Once | INTRAVENOUS | Status: AC
  Administered 2023-10-19: 10 mL

## 2023-10-19 MED ORDER — LEVOTHYROXINE SODIUM 50 MCG PO TABS
50.0000 ug | ORAL_TABLET | Freq: Every day | ORAL | 3 refills | Status: DC
  Filled 2023-10-19: qty 30, 30d supply, fill #0
  Filled 2023-11-29: qty 30, 30d supply, fill #1
  Filled 2024-01-12: qty 30, 30d supply, fill #2
  Filled 2024-02-06: qty 30, 30d supply, fill #3

## 2023-10-19 MED ORDER — AMLODIPINE BESYLATE 5 MG PO TABS
5.0000 mg | ORAL_TABLET | Freq: Every day | ORAL | 2 refills | Status: DC
  Filled 2023-10-19: qty 30, 30d supply, fill #0
  Filled 2023-11-29: qty 30, 30d supply, fill #1
  Filled 2024-01-12: qty 30, 30d supply, fill #2

## 2023-10-19 MED ORDER — SODIUM CHLORIDE 0.9 % IV SOLN: Freq: Once | INTRAVENOUS | Status: AC

## 2023-10-19 MED ORDER — SODIUM CHLORIDE 0.9 % IV SOLN
1500.0000 mg | Freq: Once | INTRAVENOUS | Status: AC
  Administered 2023-10-19: 1500 mg via INTRAVENOUS
  Filled 2023-10-19: qty 30

## 2023-10-19 NOTE — Patient Instructions (Signed)
 [facility] - A DEPT OF [facility]   Discharge Instructions: Thank you for choosing [facility] to provide your oncology and hematology care.   If you have a lab appointment with the Cancer Center, please go directly to the Cancer Center and check in at the registration area.   Wear comfortable clothing and clothing appropriate for easy access to any Portacath or PICC line.   We strive to give you quality time with your provider. You may need to reschedule your appointment if you arrive late (15 or more minutes).  Arriving late affects you and other patients whose appointments are after yours.  Also, if you miss three or more appointments without notifying the office, you may be dismissed from the clinic at the provider's discretion.      For prescription refill requests, have your pharmacy contact our office and allow 72 hours for refills to be completed.    Today you received the following chemotherapy and/or immunotherapy agents Durvalumab (IMFINZI).      To help prevent nausea and vomiting after your treatment, we encourage you to take your nausea medication as directed.  BELOW ARE SYMPTOMS THAT SHOULD BE REPORTED IMMEDIATELY: *FEVER GREATER THAN 100.4 F (38 C) OR HIGHER *CHILLS OR SWEATING *NAUSEA AND VOMITING THAT IS NOT CONTROLLED WITH YOUR NAUSEA MEDICATION *UNUSUAL SHORTNESS OF BREATH *UNUSUAL BRUISING OR BLEEDING *URINARY PROBLEMS (pain or burning when urinating, or frequent urination) *BOWEL PROBLEMS (unusual diarrhea, constipation, pain near the anus) TENDERNESS IN MOUTH AND THROAT WITH OR WITHOUT PRESENCE OF ULCERS (sore throat, sores in mouth, or a toothache) UNUSUAL RASH, SWELLING OR PAIN  UNUSUAL VAGINAL DISCHARGE OR ITCHING   Items with * indicate a potential emergency and should be followed up as soon as possible or go to the Emergency Department if any problems should occur.  Please show the CHEMOTHERAPY ALERT CARD or  IMMUNOTHERAPY ALERT CARD at check-in to the Emergency Department and triage nurse.  Should you have questions after your visit or need to cancel or reschedule your appointment, please contact [facility] - A DEPT OF [facility]  Dept: [phone]  and follow the prompts.  Office hours are 8:00 a.m. to 4:30 p.m. Monday - Friday. Please note that voicemails left after 4:00 p.m. may not be returned until the following business day.  We are closed weekends and major holidays. You have access to a nurse at all times for urgent questions. Please call the main number to the clinic Dept: [phone] and follow the prompts.   For any non-urgent questions, you may also contact your provider using MyChart. We now offer e-Visits for anyone 14 and older to request care online for non-urgent symptoms. For details visit [email or web address].   Also download the MyChart app! Go to the app store, search "MyChart", open the app, select [place], and log in with your MyChart username and password.  Durvalumab Injection What is this medication? DURVALUMAB (dur VAL ue mab) treats some types of cancer. It works by helping your immune system slow or stop the spread of cancer cells. It is a monoclonal antibody. This medicine may be used for other purposes; ask your health care provider or pharmacist if you have questions. COMMON BRAND NAME(S): IMFINZI What should I tell my care team before I take this medication? They need to know if you have any of these conditions: Allogeneic stem cell transplant (uses someone else's stem cells) Autoimmune diseases, such  as Crohn disease, ulcerative colitis, lupus History of chest radiation Nervous system problems, such as Guillain-Barre syndrome, myasthenia gravis Organ transplant An unusual or allergic reaction to durvalumab, other medications, foods, dyes, or preservatives Pregnant or trying to get pregnant Breast-feeding How should I use  this medication? This medication is infused into a vein. It is given by your care team in a hospital or clinic setting. A special MedGuide will be given to you before each treatment. Be sure to read this information carefully each time. Talk to your care team about the use of this medication in children. Special care may be needed. Overdosage: If you think you have taken too much of this medicine contact a poison control center or emergency room at once. NOTE: This medicine is only for you. Do not share this medicine with others. What if I miss a dose? Keep appointments for follow-up doses. It is important not to miss your dose. Call your care team if you are unable to keep an appointment. What may interact with this medication? Interactions have not been studied. This list may not describe all possible interactions. Give your health care provider a list of all the medicines, herbs, non-prescription drugs, or dietary supplements you use. Also tell them if you smoke, drink alcohol, or use illegal drugs. Some items may interact with your medicine. What should I watch for while using this medication? Your condition will be monitored carefully while you are receiving this medication. You may need blood work while taking this medication. This medication may cause serious skin reactions. They can happen weeks to months after starting the medication. Contact your care team right away if you notice fevers or flu-like symptoms with a rash. The rash may be red or purple and then turn into blisters or peeling of the skin. You may also notice a red rash with swelling of the face, lips, or lymph nodes in your neck or under your arms. Tell your care team right away if you have any change in your eyesight. Talk to your care team if you may be pregnant. Serious birth defects can occur if you take this medication during pregnancy and for 3 months after the last dose. You will need a negative pregnancy test before  starting this medication. Contraception is recommended while taking this medication and for 3 months after the last dose. Your care team can help you find the option that works for you. Do not breastfeed while taking this medication and for 3 months after the last dose. What side effects may I notice from receiving this medication? Side effects that you should report to your care team as soon as possible: Allergic reactions--skin rash, itching, hives, swelling of the face, lips, tongue, or throat Dry cough, shortness of breath or trouble breathing Eye pain, redness, irritation, or discharge with blurry or decreased vision Heart muscle inflammation--unusual weakness or fatigue, shortness of breath, chest pain, fast or irregular heartbeat, dizziness, swelling of the ankles, feet, or hands Hormone gland problems--headache, sensitivity to light, unusual weakness or fatigue, dizziness, fast or irregular heartbeat, increased sensitivity to cold or heat, excessive sweating, constipation, hair loss, increased thirst or amount of urine, tremors or shaking, irritability Infusion reactions--chest pain, shortness of breath or trouble breathing, feeling faint or lightheaded Kidney injury (glomerulonephritis)--decrease in the amount of urine, red or dark brown urine, foamy or bubbly urine, swelling of the ankles, hands, or feet Liver injury--right upper belly pain, loss of appetite, nausea, light-colored stool, dark yellow or brown urine,  yellowing skin or eyes, unusual weakness or fatigue Pain, tingling, or numbness in the hands or feet, muscle weakness, change in vision, confusion or trouble speaking, loss of balance or coordination, trouble walking, seizures Rash, fever, and swollen lymph nodes Redness, blistering, peeling, or loosening of the skin, including inside the mouth Sudden or severe stomach pain, bloody diarrhea, fever, nausea, vomiting Side effects that usually do not require medical attention  (report these to your care team if they continue or are bothersome): Bone, joint, or muscle pain Diarrhea Fatigue Loss of appetite Nausea Skin rash This list may not describe all possible side effects. Call your doctor for medical advice about side effects. You may report side effects to FDA at 1-800-FDA-1088. Where should I keep my medication? This medication is given in a hospital or clinic. It will not be stored at home. NOTE: This sheet is a summary. It may not cover all possible information. If you have questions about this medicine, talk to your doctor, pharmacist, or health care provider.  2024 Elsevier/Gold Standard (2021-06-29 00:00:00)

## 2023-10-19 NOTE — Progress Notes (Signed)
 Patient seen by Olam Ned NP today  Vitals are within treatment parameters:Yes   Labs are within treatment parameters: Yes   Treatment plan has been signed: Yes   Per physician team, Patient is ready for treatment and there are NO modifications to the treatment plan.

## 2023-10-19 NOTE — Progress Notes (Signed)
 [facility] OFFICE PROGRESS NOTE   Diagnosis: Cholangiocarcinoma  INTERVAL HISTORY:   [name] returns as scheduled.  He completed another cycle of Durvalumab  09/21/2023.  No rash or diarrhea.  He denies nausea/vomiting.  No pain.  He has a good appetite.  No fever, cough, shortness of breath.  Objective:  Vital signs in last 24 hours:  Blood pressure 137/85, pulse 60, temperature 97.8 F (36.6 C), temperature source Temporal, resp. rate 18, height 6' 2 (1.88 m), weight 237 lb 6.4 oz (107.7 kg), SpO2 100%.    HEENT: No thrush or ulcers. Resp: Lungs clear bilaterally. Cardio: Regular rate and rhythm. GI: No hepatosplenomegaly.  No mass.  Nontender. Vascular: No leg edema. Skin: No rash.   Lab Results:  Lab Results  Component Value Date   WBC 6.4 10/19/2023   HGB 13.8 10/19/2023   HCT 40.0 10/19/2023   MCV 87.1 10/19/2023   PLT 149 (L) 10/19/2023   NEUTROABS 3.3 10/19/2023    Imaging:  No results found.  Medications: I have reviewed the patient's current medications.  Assessment/Plan: Liver mass concerning for malignancy -CT abdomen/pelvis with contrast 08/14/1998 23-2.9 x 2.7 x 2.7 cm hypoenhancing mass in segment 5 near the liver hilum just above hepatic ductal confluence suspicious for primary bile duct malignancy or primary hepatic malignancy -MRCP 08/14/2021-irregular mass of segment 8 of the liver with perilesional enhancement concerning for cholangiocarcinoma -08/14/2021 CA 19.9 was elevated at 76 -08/16/2021 AFP 6.4 -08/16/2021 cytology from bile duct brushing showed atypical cells -08/19/2021 cytology from bile duct brushing showed cells suspicious for malignancy -08/25/2021 CT-guided liver biopsy-moderately differentiated adenocarcinoma, CK7 positive, CDX2 positive in a small population of cells, TTF-1 negative, CK20 negative-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 -CTs at [facility]  09/06/2021-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, occlusion of the anterior branch of the right portal vein, periportal enhancement and thickening on delayed imaging potentially due to tumor infiltration, no change in the 2 x 3.2 cm mass in segment 8, mild to moderate left and right intrahepatic biliary dilatation, biliary stent in place, small portacaval and periportal nodes, no evidence of metastatic disease in the chest -Cycle 1 gemcitabine /cisplatin /durvalumab  10/01/2021 -Cycle 2 gemcitabine /cisplatin /durvalumab  10/22/2021 -Cycle 3 gemcitabine /cisplatin /durvalumab  11/11/2021, D1 cisplatin  held due to neuropathy, cisplatin  resumed day 8 -Cycle 4 gemcitabine /cisplatin /durvalumab  12/03/2021 -MRI abdomen 12/15/2021-segment 8 cholangiocarcinoma no longer measurable, residual biliary duct dilation in the anterior right and left hepatic lobes, no evidence of metastatic disease persistent anterior right and new peripheral left portal vein occlusion -Cycle 5 gemcitabine /cisplatin /durvalumab  12/24/2021 -Cycle 6 gemcitabine /cisplatin /durvalumab  01/13/2022 -CTs at [facility] 02/14/2022-fine hypoattenuating masses in the left and right hepatic lobes, more prominent compared to the 12/15/2021 MRI, potentially representing multifocal cholangiocarcinoma, persistent occlusion of left portal and anterior branch of the right portal vein, no extrahepatic lymphadenopathy or metastatic disease -MRI liver 12/19 /2023-1.3 x 0.8 cm hypoenhancing lesion in segment 8, ill-defined area of delayed enhancement in the caudate measuring 1.7 cm, no left liver lesion, 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  Partial hepatectomy anatomy.  Marked improvement postsurgical fluid collections.  Biliary stent in place.  Mild biliary duct dilatation  in the central hepatic lobes.  No evidence of metastatic disease in the abdomen/pelvis. -07/27/2022 continue every 4-week Durvalumab  -CTs 10/18/2022-partial left hepatectomy, common hepatic and bile duct stent unchanged with unchanged segmental intrahepatic biliary duct dilation in the remnant left lobe and anterior right lobe.  No evidence of metastatic disease in the chest, abdomen, or pelvis.  Unchanged 0.5 cm subpleural nodule in the right middle lobe -10/19/2022 continue every 4-week Durvalumab  -CTs 02/02/2023-no evidence of disease progression, surgical treatments at the anterior margin of the right liver, left hepatectomy, stable right middle lobe nodule -02/09/2023-every 4-week Durvalumab  continued CTs 08/12/2023-partial left hepatectomy, no evidence of recurrent disease unchanged 0.5 cm right middle lobe subpleural nodule - Every 4-week Durvalumab  continue 2.  Obstructive jaundice -08/16/2021 percutaneous biliary drain placement 3.  Hepatic steatosis 4.  GERD 5.  Hypertension 6.  Normocytic anemia 7.  Enlarged prostate seen on CT 8.  Tobacco use 9.  Enterococcus and Aerococcus bacteremia 08/20/2021-discharged 08/26/2021 to complete outpatient course of Augmentin  10.  Peripheral neuropathy 11.  Port-A-Cath placement 09/23/2021 12.  Biliary drain exchange 09/23/2021; drain capped 10/12/2021 13.  Rigors following biliary drain procedure 09/23/2021, biliary drain culture-Enterobacter Cloacae, Bactrim  x7 days 09/27/2021 14.  Right leg edema and pain 10/18/2021-Doppler negative for DVT 15.  Admission 03/18/2022 with a right upper quadrant abscess, status post drain placement, culture positive for  Klebsiella CTs 03/21/2022-no change in moderate volume ascites, interval decrease in subhepatic air-filled collection with catheter in appropriate position, resolved right pneumothorax, no change in bilateral pleural effusions-moderate on right and small on left CTs 04/25/2022 at [facility]-3.1 x 2.0 cm fluid collection in the abdomen and pelvis, decreased in volume and increased in organization; resolution of subhepatic collection; increased partially visualized large right pleural effusion with near complete collapse of the right lower lobe. Drainage catheter removed 04/25/2022 16.  Large right pleural effusion on CT 04/25/2022-thoracentesis 04/29/2022, 1.75 L of pleural fluid removed; culture negative, cytology with reactive mesothelial cells present, acute inflammation 17.  Hypothyroidism, likely secondary to Durvalumab , thyroid  hormone replacement beginning November 2024 18.  06/21/2023 prostate biopsies with benign prostatic tissue      Disposition: [name] appears stable.  He remains in clinical remission from cholangiocarcinoma.  He continues Durvalumab  every 4 weeks.  He is tolerating treatment well.  Plan to proceed with the next cycle today as scheduled.  CBC and chemistry panel reviewed.  Labs adequate for treatment.  He will return for follow-up and the next cycle of Durvalumab  in 4 weeks.  We are available to see him sooner if needed.      Olam Ned ANP/GNP-BC   10/19/2023  12:18 PM

## 2023-10-20 LAB — T4: T4, Total: 5.5 ug/dL (ref 4.5–12.0)

## 2023-10-23 ENCOUNTER — Telehealth: Payer: Self-pay | Admitting: *Deleted

## 2023-10-23 NOTE — Telephone Encounter (Signed)
 10/20/2023 Late entry  Medication Prior Authorization Status  Processed CoverMyMeds KEY: [id number]) for Esomeprazole  Magnesium  20MG   Approved Today  Per [employer] Electronic PA  PA Case ID: [id number]   Effective  10/20/2023 through 02/27/2038.

## 2023-10-25 ENCOUNTER — Other Ambulatory Visit (HOSPITAL_BASED_OUTPATIENT_CLINIC_OR_DEPARTMENT_OTHER): Payer: Self-pay

## 2023-10-25 DIAGNOSIS — G4733 Obstructive sleep apnea (adult) (pediatric): Secondary | ICD-10-CM | POA: Diagnosis not present

## 2023-10-31 DIAGNOSIS — G4733 Obstructive sleep apnea (adult) (pediatric): Secondary | ICD-10-CM | POA: Diagnosis not present

## 2023-11-12 ENCOUNTER — Other Ambulatory Visit: Payer: Self-pay | Admitting: Oncology

## 2023-11-16 ENCOUNTER — Inpatient Hospital Stay

## 2023-11-16 ENCOUNTER — Inpatient Hospital Stay: Attending: Oncology

## 2023-11-16 ENCOUNTER — Inpatient Hospital Stay (HOSPITAL_BASED_OUTPATIENT_CLINIC_OR_DEPARTMENT_OTHER): Admitting: Oncology

## 2023-11-16 VITALS — BP 144/89 | HR 60 | Resp 18

## 2023-11-16 VITALS — BP 135/97 | HR 55 | Temp 98.0°F | Resp 16 | Wt 239.8 lb

## 2023-11-16 DIAGNOSIS — C221 Intrahepatic bile duct carcinoma: Secondary | ICD-10-CM | POA: Insufficient documentation

## 2023-11-16 DIAGNOSIS — Z79620 Long term (current) use of immunosuppressive biologic: Secondary | ICD-10-CM | POA: Insufficient documentation

## 2023-11-16 DIAGNOSIS — N400 Enlarged prostate without lower urinary tract symptoms: Secondary | ICD-10-CM

## 2023-11-16 DIAGNOSIS — Z5112 Encounter for antineoplastic immunotherapy: Secondary | ICD-10-CM | POA: Diagnosis not present

## 2023-11-16 LAB — CMP (CANCER CENTER ONLY)
ALT: 22 U/L (ref 0–44)
AST: 27 U/L (ref 15–41)
Albumin: 4.5 g/dL (ref 3.5–5.0)
Alkaline Phosphatase: 83 U/L (ref 38–126)
Anion gap: 10 (ref 5–15)
BUN: 16 mg/dL (ref 6–20)
CO2: 24 mmol/L (ref 22–32)
Calcium: 9.9 mg/dL (ref 8.9–10.3)
Chloride: 105 mmol/L (ref 98–111)
Creatinine: 1.14 mg/dL (ref 0.61–1.24)
GFR, Estimated: >60 mL/min (ref >60)
Glucose, Bld: 95 mg/dL (ref 70–99)
Potassium: 4.1 mmol/L (ref 3.5–5.1)
Sodium: 139 mmol/L (ref 135–145)
Total Bilirubin: 0.5 mg/dL (ref 0.0–1.2)
Total Protein: 7.3 g/dL (ref 6.5–8.1)

## 2023-11-16 LAB — CBC WITH DIFFERENTIAL (CANCER CENTER ONLY)
Abs Immature Granulocytes: 0.02 K/uL (ref 0.00–0.07)
Basophils Absolute: 0 K/uL (ref 0.0–0.1)
Basophils Relative: 1 %
Eosinophils Absolute: 0.5 K/uL (ref 0.0–0.5)
Eosinophils Relative: 7 %
HCT: 40.8 % (ref 39.0–52.0)
Hemoglobin: 14.1 g/dL (ref 13.0–17.0)
Immature Granulocytes: 0 %
Lymphocytes Relative: 34 %
Lymphs Abs: 2.3 K/uL (ref 0.7–4.0)
MCH: 29.7 pg (ref 26.0–34.0)
MCHC: 34.6 g/dL (ref 30.0–36.0)
MCV: 86.1 fL (ref 80.0–100.0)
Monocytes Absolute: 0.5 K/uL (ref 0.1–1.0)
Monocytes Relative: 7 %
Neutro Abs: 3.5 K/uL (ref 1.7–7.7)
Neutrophils Relative %: 51 %
Platelet Count: 150 K/uL (ref 150–400)
RBC: 4.74 MIL/uL (ref 4.22–5.81)
RDW: 13.1 % (ref 11.5–15.5)
WBC Count: 6.8 K/uL (ref 4.0–10.5)
nRBC: 0 % (ref 0.0–0.2)

## 2023-11-16 LAB — TSH: TSH: 31.3 u[IU]/mL — ABNORMAL HIGH (ref 0.350–4.500)

## 2023-11-16 MED ORDER — SODIUM CHLORIDE 0.9 % IV SOLN
1500.0000 mg | Freq: Once | INTRAVENOUS | Status: AC
  Administered 2023-11-16: 1500 mg via INTRAVENOUS
  Filled 2023-11-16: qty 30

## 2023-11-16 MED ORDER — SODIUM CHLORIDE 0.9 % IV SOLN: Freq: Once | INTRAVENOUS | Status: AC

## 2023-11-16 NOTE — Progress Notes (Signed)
 [facility] OFFICE PROGRESS NOTE   Diagnosis: Cholangiocarcinoma  INTERVAL HISTORY:   [name] returns as scheduled.  He completed another treatment Durvalumab  10/19/2023.  No rash or diarrhea.  He has chronic arthralgias.  He reports decreased visual acuity.  Good appetite.  He has mild tenderness at the right abdomen surgical scar  Objective:  Vital signs in last 24 hours:  Blood pressure (!) 135/97, pulse (!) 55, temperature 98 F (36.7 C), temperature source Temporal, resp. rate 16, weight 239 lb 12.8 oz (108.8 kg), SpO2 99%.   Resp: Lungs clear bilaterally Cardio: Regular rate and rhythm GI: No hepatosplenomegaly, no mass Vascular: No leg edema  Portacath/PICC-without erythema  Lab Results:  Lab Results  Component Value Date   WBC 6.8 11/16/2023   HGB 14.1 11/16/2023   HCT 40.8 11/16/2023   MCV 86.1 11/16/2023   PLT 150 11/16/2023   NEUTROABS 3.5 11/16/2023    CMP  Lab Results  Component Value Date   NA 139 11/16/2023   K 4.1 11/16/2023   CL 105 11/16/2023   CO2 24 11/16/2023   GLUCOSE 95 11/16/2023   BUN 16 11/16/2023   CREATININE 1.14 11/16/2023   CALCIUM 9.9 11/16/2023   PROT 7.3 11/16/2023   ALBUMIN 4.5 11/16/2023   AST 27 11/16/2023   ALT 22 11/16/2023   ALKPHOS 83 11/16/2023   BILITOT 0.5 11/16/2023   GFRNONAA >60 11/16/2023    Lab Results  Component Value Date   RJW800 11 09/21/2023    Lab Results  Component Value Date   INR 1.1 08/25/2021   LABPROT 13.9 08/25/2021    Imaging:  No results found.  Medications: I have reviewed the patient's current medications.   Assessment/Plan: Liver mass concerning for malignancy -CT abdomen/pelvis with contrast 08/13/2021-2.9 x 2.7 x 2.7 cm hypoenhancing mass in segment 5 near the liver hilum just above hepatic ductal confluence suspicious for primary bile duct malignancy or primary hepatic malignancy -MRCP 08/14/2021-irregular mass of segment 8 of the liver with perilesional  enhancement concerning for cholangiocarcinoma -08/14/2021 CA 19.9 was elevated at 76 -08/16/2021 AFP 6.4 -08/16/2021 cytology from bile duct brushing showed atypical cells -08/19/2021 cytology from bile duct brushing showed cells suspicious for malignancy -08/25/2021 CT-guided liver biopsy-moderately differentiated adenocarcinoma, CK7 positive, CDX2 positive in a small population of cells, TTF-1 negative, CK20 negative-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 -CTs at [facility] 09/06/2021-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, occlusion of the anterior branch of the right portal vein, periportal enhancement and thickening on delayed imaging potentially due to tumor infiltration, no change in the 2 x 3.2 cm mass in segment 8, mild to moderate left and right intrahepatic biliary dilatation, biliary stent in place, small portacaval and periportal nodes, no evidence of metastatic disease in the chest -Cycle 1 gemcitabine /cisplatin /durvalumab  10/01/2021 -Cycle 2 gemcitabine /cisplatin /durvalumab  10/22/2021 -Cycle 3 gemcitabine /cisplatin /durvalumab  11/11/2021, D1 cisplatin  held due to neuropathy, cisplatin  resumed day 8 -Cycle 4 gemcitabine /cisplatin /durvalumab  12/03/2021 -MRI abdomen 12/15/2021-segment 8 cholangiocarcinoma no longer measurable, residual biliary duct dilation in the anterior right and left hepatic lobes, no evidence of metastatic disease persistent anterior right and new peripheral left portal vein occlusion -Cycle 5 gemcitabine /cisplatin /durvalumab  12/24/2021 -Cycle 6 gemcitabine /cisplatin /durvalumab  01/13/2022 -CTs at [facility] 02/14/2022-fine hypoattenuating masses in the left and right hepatic lobes, more prominent compared to the 12/15/2021 MRI, potentially representing multifocal cholangiocarcinoma, persistent occlusion of left portal and anterior branch of the right portal vein, no extrahepatic lymphadenopathy or metastatic  disease -MRI liver 12/19 /2023-1.3 x  0.8 cm hypoenhancing lesion in segment 8, ill-defined area of delayed enhancement in the caudate measuring 1.7 cm, no left liver lesion, 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  Partial hepatectomy anatomy.  Marked improvement postsurgical fluid collections.  Biliary stent in place.  Mild biliary duct dilatation in the central hepatic lobes.  No evidence of metastatic disease in the abdomen/pelvis. -07/27/2022 continue every 4-week Durvalumab  -CTs 10/18/2022-partial left hepatectomy, common hepatic and bile duct stent unchanged with unchanged segmental intrahepatic biliary duct dilation in the remnant left lobe and anterior right lobe.  No evidence of metastatic disease in the chest, abdomen, or pelvis.  Unchanged 0.5 cm subpleural nodule in the right middle lobe -10/19/2022 continue every 4-week Durvalumab  -CTs 02/02/2023-no evidence of disease progression, surgical treatments at the anterior margin of the right liver, left hepatectomy, stable right middle lobe nodule -02/09/2023-every 4-week Durvalumab  continued CTs 08/12/2023-partial left hepatectomy, no evidence of recurrent disease unchanged 0.5 cm right middle lobe subpleural nodule - Every 4-week Durvalumab  continue 2.  Obstructive jaundice -08/16/2021 percutaneous biliary drain placement 3.  Hepatic steatosis 4.  GERD 5.  Hypertension 6.  Normocytic anemia 7.  Enlarged prostate seen on CT 8.  Tobacco  use 9.  Enterococcus and Aerococcus bacteremia 08/20/2021-discharged 08/26/2021 to complete outpatient course of Augmentin  10.  Peripheral neuropathy 11.  Port-A-Cath placement 09/23/2021 12.  Biliary drain exchange 09/23/2021; drain capped 10/12/2021 13.  Rigors following biliary drain procedure 09/23/2021, biliary drain culture-Enterobacter Cloacae, Bactrim  x7 days 09/27/2021 14.  Right leg edema and pain 10/18/2021-Doppler negative for DVT 15.  Admission 03/18/2022 with a right upper quadrant abscess, status post drain placement, culture positive for Klebsiella CTs 03/21/2022-no change in moderate volume ascites, interval decrease in subhepatic air-filled collection with catheter in appropriate position, resolved right pneumothorax, no change in bilateral pleural effusions-moderate on right and small on left CTs 04/25/2022 at [facility]-3.1 x 2.0 cm fluid collection in the abdomen and pelvis, decreased in volume and increased in organization; resolution of subhepatic collection; increased partially visualized large right pleural effusion with near complete collapse of the right lower lobe. Drainage catheter removed 04/25/2022 16.  Large right pleural effusion on CT 04/25/2022-thoracentesis 04/29/2022, 1.75 L of pleural fluid removed; culture negative, cytology with reactive mesothelial cells present, acute inflammation 17.  Hypothyroidism, likely secondary to Durvalumab , thyroid  hormone replacement beginning November 2024 18.  06/21/2023 prostate biopsies with benign prostatic tissue        Disposition: Mr Blevins has a history of cholangiocarcinoma.  He is in clinical remission.  He continues treatment with durvalumab .  He is tolerating the durvalumab  well.  He will complete another treatment today.  The plan is to continue durvalumab  through at least the end of this year.  He will undergo surveillance CTs within the next few months.  Arley Hof, MD  11/16/2023  12:10 PM

## 2023-11-16 NOTE — Patient Instructions (Signed)
 [facility] - A DEPT OF [facility]   Discharge Instructions: Thank you for choosing [facility] to provide your oncology and hematology care.   If you have a lab appointment with the Cancer Center, please go directly to the Cancer Center and check in at the registration area.   Wear comfortable clothing and clothing appropriate for easy access to any Portacath or PICC line.   We strive to give you quality time with your provider. You may need to reschedule your appointment if you arrive late (15 or more minutes).  Arriving late affects you and other patients whose appointments are after yours.  Also, if you miss three or more appointments without notifying the office, you may be dismissed from the clinic at the provider's discretion.      For prescription refill requests, have your pharmacy contact our office and allow 72 hours for refills to be completed.    Today you received the following chemotherapy and/or immunotherapy agents Durvalumab (IMFINZI).      To help prevent nausea and vomiting after your treatment, we encourage you to take your nausea medication as directed.  BELOW ARE SYMPTOMS THAT SHOULD BE REPORTED IMMEDIATELY: *FEVER GREATER THAN 100.4 F (38 C) OR HIGHER *CHILLS OR SWEATING *NAUSEA AND VOMITING THAT IS NOT CONTROLLED WITH YOUR NAUSEA MEDICATION *UNUSUAL SHORTNESS OF BREATH *UNUSUAL BRUISING OR BLEEDING *URINARY PROBLEMS (pain or burning when urinating, or frequent urination) *BOWEL PROBLEMS (unusual diarrhea, constipation, pain near the anus) TENDERNESS IN MOUTH AND THROAT WITH OR WITHOUT PRESENCE OF ULCERS (sore throat, sores in mouth, or a toothache) UNUSUAL RASH, SWELLING OR PAIN  UNUSUAL VAGINAL DISCHARGE OR ITCHING   Items with * indicate a potential emergency and should be followed up as soon as possible or go to the Emergency Department if any problems should occur.  Please show the CHEMOTHERAPY ALERT CARD or  IMMUNOTHERAPY ALERT CARD at check-in to the Emergency Department and triage nurse.  Should you have questions after your visit or need to cancel or reschedule your appointment, please contact [facility] - A DEPT OF [facility]  Dept: [phone]  and follow the prompts.  Office hours are 8:00 a.m. to 4:30 p.m. Monday - Friday. Please note that voicemails left after 4:00 p.m. may not be returned until the following business day.  We are closed weekends and major holidays. You have access to a nurse at all times for urgent questions. Please call the main number to the clinic Dept: [phone] and follow the prompts.   For any non-urgent questions, you may also contact your provider using MyChart. We now offer e-Visits for anyone 14 and older to request care online for non-urgent symptoms. For details visit [email or web address].   Also download the MyChart app! Go to the app store, search "MyChart", open the app, select [place], and log in with your MyChart username and password.  Durvalumab Injection What is this medication? DURVALUMAB (dur VAL ue mab) treats some types of cancer. It works by helping your immune system slow or stop the spread of cancer cells. It is a monoclonal antibody. This medicine may be used for other purposes; ask your health care provider or pharmacist if you have questions. COMMON BRAND NAME(S): IMFINZI What should I tell my care team before I take this medication? They need to know if you have any of these conditions: Allogeneic stem cell transplant (uses someone else's stem cells) Autoimmune diseases, such  as Crohn disease, ulcerative colitis, lupus History of chest radiation Nervous system problems, such as Guillain-Barre syndrome, myasthenia gravis Organ transplant An unusual or allergic reaction to durvalumab, other medications, foods, dyes, or preservatives Pregnant or trying to get pregnant Breast-feeding How should I use  this medication? This medication is infused into a vein. It is given by your care team in a hospital or clinic setting. A special MedGuide will be given to you before each treatment. Be sure to read this information carefully each time. Talk to your care team about the use of this medication in children. Special care may be needed. Overdosage: If you think you have taken too much of this medicine contact a poison control center or emergency room at once. NOTE: This medicine is only for you. Do not share this medicine with others. What if I miss a dose? Keep appointments for follow-up doses. It is important not to miss your dose. Call your care team if you are unable to keep an appointment. What may interact with this medication? Interactions have not been studied. This list may not describe all possible interactions. Give your health care provider a list of all the medicines, herbs, non-prescription drugs, or dietary supplements you use. Also tell them if you smoke, drink alcohol, or use illegal drugs. Some items may interact with your medicine. What should I watch for while using this medication? Your condition will be monitored carefully while you are receiving this medication. You may need blood work while taking this medication. This medication may cause serious skin reactions. They can happen weeks to months after starting the medication. Contact your care team right away if you notice fevers or flu-like symptoms with a rash. The rash may be red or purple and then turn into blisters or peeling of the skin. You may also notice a red rash with swelling of the face, lips, or lymph nodes in your neck or under your arms. Tell your care team right away if you have any change in your eyesight. Talk to your care team if you may be pregnant. Serious birth defects can occur if you take this medication during pregnancy and for 3 months after the last dose. You will need a negative pregnancy test before  starting this medication. Contraception is recommended while taking this medication and for 3 months after the last dose. Your care team can help you find the option that works for you. Do not breastfeed while taking this medication and for 3 months after the last dose. What side effects may I notice from receiving this medication? Side effects that you should report to your care team as soon as possible: Allergic reactions--skin rash, itching, hives, swelling of the face, lips, tongue, or throat Dry cough, shortness of breath or trouble breathing Eye pain, redness, irritation, or discharge with blurry or decreased vision Heart muscle inflammation--unusual weakness or fatigue, shortness of breath, chest pain, fast or irregular heartbeat, dizziness, swelling of the ankles, feet, or hands Hormone gland problems--headache, sensitivity to light, unusual weakness or fatigue, dizziness, fast or irregular heartbeat, increased sensitivity to cold or heat, excessive sweating, constipation, hair loss, increased thirst or amount of urine, tremors or shaking, irritability Infusion reactions--chest pain, shortness of breath or trouble breathing, feeling faint or lightheaded Kidney injury (glomerulonephritis)--decrease in the amount of urine, red or dark brown urine, foamy or bubbly urine, swelling of the ankles, hands, or feet Liver injury--right upper belly pain, loss of appetite, nausea, light-colored stool, dark yellow or brown urine,  yellowing skin or eyes, unusual weakness or fatigue Pain, tingling, or numbness in the hands or feet, muscle weakness, change in vision, confusion or trouble speaking, loss of balance or coordination, trouble walking, seizures Rash, fever, and swollen lymph nodes Redness, blistering, peeling, or loosening of the skin, including inside the mouth Sudden or severe stomach pain, bloody diarrhea, fever, nausea, vomiting Side effects that usually do not require medical attention  (report these to your care team if they continue or are bothersome): Bone, joint, or muscle pain Diarrhea Fatigue Loss of appetite Nausea Skin rash This list may not describe all possible side effects. Call your doctor for medical advice about side effects. You may report side effects to FDA at 1-800-FDA-1088. Where should I keep my medication? This medication is given in a hospital or clinic. It will not be stored at home. NOTE: This sheet is a summary. It may not cover all possible information. If you have questions about this medicine, talk to your doctor, pharmacist, or health care provider.  2024 Elsevier/Gold Standard (2021-06-29 00:00:00)

## 2023-11-16 NOTE — Progress Notes (Signed)
 Patient seen by Dr. Arley Hof today  Vitals are within treatment parameters:No (Please specify and give further instructions.)B/P 133/91 Its okay to proceed.  Labs are within treatment parameters: Yes   Treatment plan has been signed: Yes   Per physician team, Patient is ready for treatment and there are NO modifications to the treatment plan.

## 2023-11-17 LAB — T4: T4, Total: 5.2 ug/dL (ref 4.5–12.0)

## 2023-11-25 DIAGNOSIS — G4733 Obstructive sleep apnea (adult) (pediatric): Secondary | ICD-10-CM | POA: Diagnosis not present

## 2023-11-29 ENCOUNTER — Encounter: Payer: Self-pay | Admitting: Oncology

## 2023-11-29 ENCOUNTER — Other Ambulatory Visit (HOSPITAL_BASED_OUTPATIENT_CLINIC_OR_DEPARTMENT_OTHER): Payer: Self-pay

## 2023-12-13 ENCOUNTER — Encounter: Payer: Self-pay | Admitting: Oncology

## 2023-12-14 ENCOUNTER — Inpatient Hospital Stay

## 2023-12-14 ENCOUNTER — Encounter: Payer: Self-pay | Admitting: Nurse Practitioner

## 2023-12-14 ENCOUNTER — Inpatient Hospital Stay: Attending: Oncology

## 2023-12-14 ENCOUNTER — Inpatient Hospital Stay (HOSPITAL_BASED_OUTPATIENT_CLINIC_OR_DEPARTMENT_OTHER): Admitting: Nurse Practitioner

## 2023-12-14 ENCOUNTER — Telehealth: Payer: Self-pay | Admitting: *Deleted

## 2023-12-14 VITALS — BP 140/94 | HR 60 | Temp 98.2°F | Resp 18 | Ht 74.0 in | Wt 237.7 lb

## 2023-12-14 VITALS — BP 146/87 | HR 55 | Temp 98.4°F | Resp 18

## 2023-12-14 DIAGNOSIS — Z79620 Long term (current) use of immunosuppressive biologic: Secondary | ICD-10-CM | POA: Insufficient documentation

## 2023-12-14 DIAGNOSIS — Z23 Encounter for immunization: Secondary | ICD-10-CM | POA: Insufficient documentation

## 2023-12-14 DIAGNOSIS — Z5112 Encounter for antineoplastic immunotherapy: Secondary | ICD-10-CM | POA: Diagnosis not present

## 2023-12-14 DIAGNOSIS — C221 Intrahepatic bile duct carcinoma: Secondary | ICD-10-CM

## 2023-12-14 DIAGNOSIS — N400 Enlarged prostate without lower urinary tract symptoms: Secondary | ICD-10-CM | POA: Diagnosis not present

## 2023-12-14 LAB — CBC WITH DIFFERENTIAL (CANCER CENTER ONLY)
Abs Immature Granulocytes: 0.01 K/uL (ref 0.00–0.07)
Basophils Absolute: 0.1 K/uL (ref 0.0–0.1)
Basophils Relative: 1 %
Eosinophils Absolute: 0.4 K/uL (ref 0.0–0.5)
Eosinophils Relative: 7 %
HCT: 40.3 % (ref 39.0–52.0)
Hemoglobin: 14.1 g/dL (ref 13.0–17.0)
Immature Granulocytes: 0 %
Lymphocytes Relative: 32 %
Lymphs Abs: 2.1 K/uL (ref 0.7–4.0)
MCH: 30.1 pg (ref 26.0–34.0)
MCHC: 35 g/dL (ref 30.0–36.0)
MCV: 85.9 fL (ref 80.0–100.0)
Monocytes Absolute: 0.5 K/uL (ref 0.1–1.0)
Monocytes Relative: 7 %
Neutro Abs: 3.4 K/uL (ref 1.7–7.7)
Neutrophils Relative %: 53 %
Platelet Count: 162 K/uL (ref 150–400)
RBC: 4.69 MIL/uL (ref 4.22–5.81)
RDW: 13.1 % (ref 11.5–15.5)
WBC Count: 6.5 K/uL (ref 4.0–10.5)
nRBC: 0 % (ref 0.0–0.2)

## 2023-12-14 LAB — CMP (CANCER CENTER ONLY)
ALT: 20 U/L (ref 0–44)
AST: 25 U/L (ref 15–41)
Albumin: 4.6 g/dL (ref 3.5–5.0)
Alkaline Phosphatase: 79 U/L (ref 38–126)
Anion gap: 10 (ref 5–15)
BUN: 15 mg/dL (ref 6–20)
CO2: 26 mmol/L (ref 22–32)
Calcium: 10 mg/dL (ref 8.9–10.3)
Chloride: 103 mmol/L (ref 98–111)
Creatinine: 1.27 mg/dL — ABNORMAL HIGH (ref 0.61–1.24)
GFR, Estimated: >60 mL/min (ref >60)
Glucose, Bld: 91 mg/dL (ref 70–99)
Potassium: 4 mmol/L (ref 3.5–5.1)
Sodium: 139 mmol/L (ref 135–145)
Total Bilirubin: 0.7 mg/dL (ref 0.0–1.2)
Total Protein: 7.2 g/dL (ref 6.5–8.1)

## 2023-12-14 LAB — TSH: TSH: 13.3 u[IU]/mL — ABNORMAL HIGH (ref 0.350–4.500)

## 2023-12-14 MED ORDER — SODIUM CHLORIDE 0.9 % IV SOLN
1500.0000 mg | Freq: Once | INTRAVENOUS | Status: AC
  Administered 2023-12-14: 1500 mg via INTRAVENOUS
  Filled 2023-12-14: qty 30

## 2023-12-14 MED ORDER — SODIUM CHLORIDE 0.9 % IV SOLN: Freq: Once | INTRAVENOUS | Status: AC

## 2023-12-14 MED ORDER — INFLUENZA VIRUS VACC SPLIT PF (FLUZONE) 0.5 ML IM SUSY
0.5000 mL | PREFILLED_SYRINGE | Freq: Once | INTRAMUSCULAR | Status: AC
  Administered 2023-12-14: 0.5 mL via INTRAMUSCULAR
  Filled 2023-12-14: qty 0.5

## 2023-12-14 NOTE — Progress Notes (Signed)
 [facility]   Telephone:([phone] [phone]) [phone]    Patient Care Team: Caudle, Thersia Bitters, FNP as PCP - General (Family Medicine)   CHIEF COMPLAINT: Follow up cholangiocarcinoma  CURRENT THERAPY: Maintenance Durvalumab  every 4 weeks  INTERVAL HISTORY Jeffrey Blevins returns for follow-up and treatment.  Tolerates treatment with similar arthralgia and fatigue, he recovers well after a week or so.  Eating and drinking well.  Denies new or worsening pain, nausea/vomiting, cough, dyspnea, rash, or any other new or specific complaints.  ROS  All other systems reviewed and negative  Past Medical History:  Diagnosis Date   Basal cell carcinoma 11/10/2022   rigght preauricular area  needs mohs   Cholangiocarcinoma (HCC)    Dx 07/2021.  Followed by Dr. Cloretta, Dr. Barbaraann at [facility]   GERD (gastroesophageal reflux disease)    Hiatal hernia    History of Barrett's esophagus    per pt dx yrs ago, but with last egd none noted 05/ 2021   History of esophageal dilatation    per pt hx several times last one approx. 2009 then 05/ 2021  for stricture   Hypertension    followed by pcp   Raynauds phenomenon    Right hydrocele      Past Surgical History:  Procedure Laterality Date   COLONOSCOPY WITH ESOPHAGOGASTRODUODENOSCOPY (EGD)  07/16/2019   last one   INGUINAL HERNIA REPAIR Bilateral    last one 1990s   IR CHOLANGIOGRAM EXISTING TUBE  10/12/2021   IR ENDOLUMINAL BX OF BILIARY TREE  08/19/2021   IR EXCHANGE BILIARY DRAIN  08/19/2021   IR EXCHANGE BILIARY DRAIN  09/23/2021   IR EXCHANGE BILIARY DRAIN  11/04/2021   IR IMAGING GUIDED PORT INSERTION  09/23/2021   IR INT EXT BILIARY DRAIN WITH CHOLANGIOGRAM  08/16/2021   SPERMATOCELECTOMY Right 10/09/2020   Procedure: SPERMATOCELECTOMY;  Surgeon: Nieves Cough, MD;  Location: [facility];  Service: Urology;  Laterality: Right;     Outpatient Encounter Medications as of 12/14/2023  Medication Sig    amLODipine  (NORVASC ) 5 MG tablet Take 1 tablet (5 mg total) by mouth daily.   esomeprazole  (NEXIUM ) 20 MG capsule Take 1 capsule (20 mg total) by mouth daily as needed.   ibuprofen  (ADVIL ) 200 MG tablet Take 400 mg by mouth 3 (three) times daily as needed for headache (pain).   levothyroxine  (SYNTHROID ) 50 MCG tablet Take 1 tablet (50 mcg total) by mouth daily before breakfast.   tadalafil  (CIALIS ) 20 MG tablet Take 1 tablet (20 mg total) by mouth at bedtime as needed.   Facility-Administered Encounter Medications as of 12/14/2023  Medication   magnesium  sulfate 2 GM/50ML IVPB   palonosetron  (ALOXI ) 0.25 MG/5ML injection     Today's Vitals   12/14/23 1100 12/14/23 1101 12/14/23 1105  BP: (!) 139/94 (!) 140/94   Pulse: 60    Resp: 18    Temp: 98.2 F (36.8 C)    TempSrc: Temporal    SpO2: 99%    Weight: 237 lb 11.2 oz (107.8 kg)    Height: 6' 2 (1.88 m)    PainSc:   0-No pain   Body mass index is 30.52 kg/m.   ECOG PERFORMANCE STATUS: 1 - Symptomatic but completely ambulatory  PHYSICAL EXAM GENERAL:alert, no distress and comfortable SKIN: no rash  EYES: sclera clear NECK: without mass LYMPH:  no palpable cervical or supraclavicular lymphadenopathy  LUNGS: clear with normal breathing effort HEART: regular rate &  rhythm, no lower extremity edema ABDOMEN: abdomen soft, non-tender and normal bowel sounds NEURO: alert & oriented x 3 with fluent speech, no focal motor/sensory deficits PAC without erythema    CBC    Latest Ref Rng & Units 12/14/2023   10:40 AM 11/16/2023   11:00 AM 10/19/2023   11:20 AM  CBC  WBC 4.0 - 10.5 K/uL 6.5  6.8  6.4   Hemoglobin 13.0 - 17.0 g/dL 85.8  85.8  86.1   Hematocrit 39.0 - 52.0 % 40.3  40.8  40.0   Platelets 150 - 400 K/uL 162  150  149       CMP     Latest Ref Rng & Units 12/14/2023   10:40 AM 11/16/2023   11:00 AM 10/19/2023   11:20 AM  CMP  Glucose 70 - 99 mg/dL 91  95  89   BUN 6 - 20 mg/dL 15  16  17    Creatinine 0.61 -  1.24 mg/dL 8.72  8.85  8.87   Sodium 135 - 145 mmol/L 139  139  139   Potassium 3.5 - 5.1 mmol/L 4.0  4.1  4.3   Chloride 98 - 111 mmol/L 103  105  104   CO2 22 - 32 mmol/L 26  24  25    Calcium 8.9 - 10.3 mg/dL 89.9  9.9  89.9   Total Protein 6.5 - 8.1 g/dL 7.2  7.3  7.0   Total Bilirubin 0.0 - 1.2 mg/dL 0.7  0.5  0.8   Alkaline Phos 38 - 126 U/L 79  83  81   AST 15 - 41 U/L 25  27  26    ALT 0 - 44 U/L 20  22  21        ASSESSMENT & PLAN: Liver mass concerning for malignancy -CT abdomen/pelvis with contrast 08/13/2021-2.9 x 2.7 x 2.7 cm hypoenhancing mass in segment 5 near the liver hilum just above hepatic ductal confluence suspicious for primary bile duct malignancy or primary hepatic malignancy -MRCP 08/14/2021-irregular mass of segment 8 of the liver with perilesional enhancement concerning for cholangiocarcinoma -08/14/2021 CA 19.9 was elevated at 76 -08/16/2021 AFP 6.4 -08/16/2021 cytology from bile duct brushing showed atypical cells -08/19/2021 cytology from bile duct brushing showed cells suspicious for malignancy -08/25/2021 CT-guided liver biopsy-moderately differentiated adenocarcinoma, CK7 positive, CDX2 positive in a small population of cells, TTF-1 negative, CK20 negative-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 -CTs at [facility] 09/06/2021-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, occlusion of the anterior branch of the right portal vein, periportal enhancement and thickening on delayed imaging potentially due to tumor infiltration, no change in the 2 x 3.2 cm mass in segment 8, mild to moderate left and right intrahepatic biliary dilatation, biliary stent in place, small portacaval and periportal nodes, no evidence of metastatic disease in the chest -Cycle 1 gemcitabine /cisplatin /durvalumab  10/01/2021 -Cycle 2 gemcitabine /cisplatin /durvalumab  10/22/2021 -Cycle 3 gemcitabine /cisplatin /durvalumab  11/11/2021, D1 cisplatin  held  due to neuropathy, cisplatin  resumed day 8 -Cycle 4 gemcitabine /cisplatin /durvalumab  12/03/2021 -MRI abdomen 12/15/2021-segment 8 cholangiocarcinoma no longer measurable, residual biliary duct dilation in the anterior right and left hepatic lobes, no evidence of metastatic disease persistent anterior right and new peripheral left portal vein occlusion -Cycle 5 gemcitabine /cisplatin /durvalumab  12/24/2021 -Cycle 6 gemcitabine /cisplatin /durvalumab  01/13/2022 -CTs at [facility] 02/14/2022-fine hypoattenuating masses in the left and right hepatic lobes, more prominent compared to the 12/15/2021 MRI, potentially representing multifocal cholangiocarcinoma, persistent occlusion of left portal and anterior branch of the right portal vein, no extrahepatic lymphadenopathy  or metastatic disease -MRI liver 12/19 /2023-1.3 x 0.8 cm hypoenhancing lesion in segment 8, ill-defined area of delayed enhancement in the caudate measuring 1.7 cm, no left liver lesion, 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  Partial hepatectomy anatomy.  Marked improvement postsurgical fluid collections.  Biliary stent in place.  Mild biliary duct dilatation in the central hepatic lobes.  No evidence of metastatic disease in the abdomen/pelvis. -07/27/2022 continue every 4-week Durvalumab  -CTs 10/18/2022-partial left hepatectomy, common hepatic and bile duct stent unchanged with unchanged segmental intrahepatic biliary duct dilation in the remnant  left lobe and anterior right lobe.  No evidence of metastatic disease in the chest, abdomen, or pelvis.  Unchanged 0.5 cm subpleural nodule in the right middle lobe -10/19/2022 continue every 4-week Durvalumab  -CTs 02/02/2023-no evidence of disease progression, surgical treatments at the anterior margin of the right liver, left hepatectomy, stable right middle lobe nodule -02/09/2023-every 4-week Durvalumab  continued CTs 08/12/2023-partial left hepatectomy, no evidence of recurrent disease unchanged 0.5 cm right middle lobe subpleural nodule - Every 4-week Durvalumab  continue 2.  Obstructive jaundice -08/16/2021 percutaneous biliary drain placement 3.  Hepatic steatosis 4.  GERD 5.  Hypertension 6.  Normocytic anemia 7.  Enlarged prostate seen on CT 8.  Tobacco use 9.  Enterococcus and Aerococcus bacteremia 08/20/2021-discharged 08/26/2021 to complete outpatient course of Augmentin  10.  Peripheral neuropathy 11.  Port-A-Cath placement 09/23/2021 12.  Biliary drain exchange 09/23/2021; drain capped 10/12/2021 13.  Rigors following biliary drain procedure 09/23/2021, biliary drain culture-Enterobacter Cloacae, Bactrim  x7 days 09/27/2021 14.  Right leg edema and pain 10/18/2021-Doppler negative for DVT 15.  Admission 03/18/2022 with a right upper quadrant abscess, status post drain placement, culture positive for Klebsiella CTs 03/21/2022-no change in moderate volume ascites, interval decrease in subhepatic air-filled collection with catheter in appropriate position, resolved right pneumothorax, no change in bilateral pleural effusions-moderate on right and small on left CTs 04/25/2022 at [facility]-3.1 x 2.0 cm fluid collection in the abdomen and pelvis, decreased in volume and increased in organization; resolution of subhepatic collection; increased partially visualized large right pleural effusion with near complete collapse of the right lower lobe. Drainage catheter removed 04/25/2022 16.  Large right pleural  effusion on CT 04/25/2022-thoracentesis 04/29/2022, 1.75 L of pleural fluid removed; culture negative, cytology with reactive mesothelial cells present, acute inflammation 17.  Hypothyroidism, likely secondary to Durvalumab , thyroid  hormone replacement beginning November 2024 18.  06/21/2023 prostate biopsies with benign prostatic tissue     Disposition:  Jeffrey Blevins appears stable.  He continues maintenance Durvalumab , tolerating well with stable arthralgia and fatigue.  Side effects are adequately managed with supportive care at home.  He is able to recover and function well and maintain adequate performance status.  There is no clinical evidence of disease recurrence.  Labs reviewed, CBC/CMP are adequate, TSH has improved.  Continue same dose Synthroid .  He will proceed with Durvalumab  today as scheduled, no dose adjustments.  He will return for follow-up and next cycle in 4 weeks, or sooner if needed.   Orders Placed This Encounter  Procedures   CBC with Differential (Cancer Center Only)    Standing Status:  Future    Expected Date:   02/08/2024    Expiration Date:   02/07/2025   CMP (Cancer Center only)    Standing Status:   Future    Expected Date:   02/08/2024    Expiration Date:   02/07/2025   T4    Standing Status:   Future    Expected Date:   02/08/2024    Expiration Date:   02/07/2025   TSH    Standing Status:   Future    Expected Date:   02/08/2024    Expiration Date:   02/07/2025      All questions were answered. The patient knows to call the clinic with any problems, questions or concerns. No barriers to learning were detected.  Letrell Attwood K Toan Mort, NP 12/14/2023

## 2023-12-14 NOTE — Progress Notes (Signed)
 Patient seen by Lacie Burton, NP today  Vitals are within treatment parameters:Yes  OK to proceed w/BP 139/94  Labs are within treatment parameters: Yes  TSH elevated, but improved--proceed  Treatment plan has been signed: Yes   Per physician team, Patient is ready for treatment and there are NO modifications to the treatment plan. Patient requests flu vaccine today. Orders are under [facility]

## 2023-12-14 NOTE — Patient Instructions (Signed)
 [facility] - A DEPT OF [facility]   Discharge Instructions: Thank you for choosing [facility] to provide your oncology and hematology care.   If you have a lab appointment with the Cancer Center, please go directly to the Cancer Center and check in at the registration area.   Wear comfortable clothing and clothing appropriate for easy access to any Portacath or PICC line.   We strive to give you quality time with your provider. You may need to reschedule your appointment if you arrive late (15 or more minutes).  Arriving late affects you and other patients whose appointments are after yours.  Also, if you miss three or more appointments without notifying the office, you may be dismissed from the clinic at the provider's discretion.      For prescription refill requests, have your pharmacy contact our office and allow 72 hours for refills to be completed.    Today you received the following chemotherapy and/or immunotherapy agents Durvalumab (IMFINZI).      To help prevent nausea and vomiting after your treatment, we encourage you to take your nausea medication as directed.  BELOW ARE SYMPTOMS THAT SHOULD BE REPORTED IMMEDIATELY: *FEVER GREATER THAN 100.4 F (38 C) OR HIGHER *CHILLS OR SWEATING *NAUSEA AND VOMITING THAT IS NOT CONTROLLED WITH YOUR NAUSEA MEDICATION *UNUSUAL SHORTNESS OF BREATH *UNUSUAL BRUISING OR BLEEDING *URINARY PROBLEMS (pain or burning when urinating, or frequent urination) *BOWEL PROBLEMS (unusual diarrhea, constipation, pain near the anus) TENDERNESS IN MOUTH AND THROAT WITH OR WITHOUT PRESENCE OF ULCERS (sore throat, sores in mouth, or a toothache) UNUSUAL RASH, SWELLING OR PAIN  UNUSUAL VAGINAL DISCHARGE OR ITCHING   Items with * indicate a potential emergency and should be followed up as soon as possible or go to the Emergency Department if any problems should occur.  Please show the CHEMOTHERAPY ALERT CARD or  IMMUNOTHERAPY ALERT CARD at check-in to the Emergency Department and triage nurse.  Should you have questions after your visit or need to cancel or reschedule your appointment, please contact [facility] - A DEPT OF [facility]  Dept: [phone]  and follow the prompts.  Office hours are 8:00 a.m. to 4:30 p.m. Monday - Friday. Please note that voicemails left after 4:00 p.m. may not be returned until the following business day.  We are closed weekends and major holidays. You have access to a nurse at all times for urgent questions. Please call the main number to the clinic Dept: [phone] and follow the prompts.   For any non-urgent questions, you may also contact your provider using MyChart. We now offer e-Visits for anyone 14 and older to request care online for non-urgent symptoms. For details visit [email or web address].   Also download the MyChart app! Go to the app store, search "MyChart", open the app, select [place], and log in with your MyChart username and password.  Durvalumab Injection What is this medication? DURVALUMAB (dur VAL ue mab) treats some types of cancer. It works by helping your immune system slow or stop the spread of cancer cells. It is a monoclonal antibody. This medicine may be used for other purposes; ask your health care provider or pharmacist if you have questions. COMMON BRAND NAME(S): IMFINZI What should I tell my care team before I take this medication? They need to know if you have any of these conditions: Allogeneic stem cell transplant (uses someone else's stem cells) Autoimmune diseases, such  as Crohn disease, ulcerative colitis, lupus History of chest radiation Nervous system problems, such as Guillain-Barre syndrome, myasthenia gravis Organ transplant An unusual or allergic reaction to durvalumab, other medications, foods, dyes, or preservatives Pregnant or trying to get pregnant Breast-feeding How should I use  this medication? This medication is infused into a vein. It is given by your care team in a hospital or clinic setting. A special MedGuide will be given to you before each treatment. Be sure to read this information carefully each time. Talk to your care team about the use of this medication in children. Special care may be needed. Overdosage: If you think you have taken too much of this medicine contact a poison control center or emergency room at once. NOTE: This medicine is only for you. Do not share this medicine with others. What if I miss a dose? Keep appointments for follow-up doses. It is important not to miss your dose. Call your care team if you are unable to keep an appointment. What may interact with this medication? Interactions have not been studied. This list may not describe all possible interactions. Give your health care provider a list of all the medicines, herbs, non-prescription drugs, or dietary supplements you use. Also tell them if you smoke, drink alcohol, or use illegal drugs. Some items may interact with your medicine. What should I watch for while using this medication? Your condition will be monitored carefully while you are receiving this medication. You may need blood work while taking this medication. This medication may cause serious skin reactions. They can happen weeks to months after starting the medication. Contact your care team right away if you notice fevers or flu-like symptoms with a rash. The rash may be red or purple and then turn into blisters or peeling of the skin. You may also notice a red rash with swelling of the face, lips, or lymph nodes in your neck or under your arms. Tell your care team right away if you have any change in your eyesight. Talk to your care team if you may be pregnant. Serious birth defects can occur if you take this medication during pregnancy and for 3 months after the last dose. You will need a negative pregnancy test before  starting this medication. Contraception is recommended while taking this medication and for 3 months after the last dose. Your care team can help you find the option that works for you. Do not breastfeed while taking this medication and for 3 months after the last dose. What side effects may I notice from receiving this medication? Side effects that you should report to your care team as soon as possible: Allergic reactions--skin rash, itching, hives, swelling of the face, lips, tongue, or throat Dry cough, shortness of breath or trouble breathing Eye pain, redness, irritation, or discharge with blurry or decreased vision Heart muscle inflammation--unusual weakness or fatigue, shortness of breath, chest pain, fast or irregular heartbeat, dizziness, swelling of the ankles, feet, or hands Hormone gland problems--headache, sensitivity to light, unusual weakness or fatigue, dizziness, fast or irregular heartbeat, increased sensitivity to cold or heat, excessive sweating, constipation, hair loss, increased thirst or amount of urine, tremors or shaking, irritability Infusion reactions--chest pain, shortness of breath or trouble breathing, feeling faint or lightheaded Kidney injury (glomerulonephritis)--decrease in the amount of urine, red or dark brown urine, foamy or bubbly urine, swelling of the ankles, hands, or feet Liver injury--right upper belly pain, loss of appetite, nausea, light-colored stool, dark yellow or brown urine,  yellowing skin or eyes, unusual weakness or fatigue Pain, tingling, or numbness in the hands or feet, muscle weakness, change in vision, confusion or trouble speaking, loss of balance or coordination, trouble walking, seizures Rash, fever, and swollen lymph nodes Redness, blistering, peeling, or loosening of the skin, including inside the mouth Sudden or severe stomach pain, bloody diarrhea, fever, nausea, vomiting Side effects that usually do not require medical attention  (report these to your care team if they continue or are bothersome): Bone, joint, or muscle pain Diarrhea Fatigue Loss of appetite Nausea Skin rash This list may not describe all possible side effects. Call your doctor for medical advice about side effects. You may report side effects to FDA at 1-800-FDA-1088. Where should I keep my medication? This medication is given in a hospital or clinic. It will not be stored at home. NOTE: This sheet is a summary. It may not cover all possible information. If you have questions about this medicine, talk to your doctor, pharmacist, or health care provider.  2024 Elsevier/Gold Standard (2021-06-29 00:00:00)

## 2023-12-14 NOTE — Telephone Encounter (Signed)
 LVM that HIM did not send records to [employer]  [employer] due to outdated ROI. Requested he come to office to sign the new one and then it will be forwarded to HIM to complete the record request. Also sent MyChart message.

## 2023-12-15 LAB — T4: T4, Total: 7.1 ug/dL (ref 4.5–12.0)

## 2023-12-16 LAB — PROSTATE-SPECIFIC AG, SERUM (LABCORP): Prostate Specific Ag, Serum: 5 ng/mL — ABNORMAL HIGH (ref 0.0–4.0)

## 2023-12-17 ENCOUNTER — Other Ambulatory Visit: Payer: Self-pay

## 2023-12-20 ENCOUNTER — Other Ambulatory Visit: Payer: Self-pay

## 2023-12-22 ENCOUNTER — Encounter: Payer: Self-pay | Admitting: *Deleted

## 2023-12-22 NOTE — Progress Notes (Signed)
 Emailed ROI and form from [employer] requesting medical records from 09/30/06/25 to present.

## 2023-12-27 ENCOUNTER — Telehealth: Payer: Self-pay

## 2023-12-27 NOTE — Telephone Encounter (Signed)
 Sent the pt an ROI form to be updated in his chart and for his disability forms as well.

## 2024-01-02 ENCOUNTER — Telehealth: Payer: Self-pay

## 2024-01-02 NOTE — Telephone Encounter (Signed)
 LVM for pt regarding his FMLA forms being completed,faxed,and confirmation received.

## 2024-01-04 ENCOUNTER — Other Ambulatory Visit: Payer: Self-pay | Admitting: Oncology

## 2024-01-05 ENCOUNTER — Encounter: Payer: Self-pay | Admitting: Oncology

## 2024-01-08 ENCOUNTER — Telehealth: Payer: Self-pay

## 2024-01-08 NOTE — Telephone Encounter (Signed)
 Pt called in today for PSA lab orders. Pt is made aware that PSA labs orders are in and he can get that done through any [facility]. Pt voiced understanding

## 2024-01-11 ENCOUNTER — Other Ambulatory Visit

## 2024-01-12 ENCOUNTER — Inpatient Hospital Stay

## 2024-01-12 ENCOUNTER — Other Ambulatory Visit (HOSPITAL_BASED_OUTPATIENT_CLINIC_OR_DEPARTMENT_OTHER): Payer: Self-pay

## 2024-01-12 ENCOUNTER — Encounter: Payer: Self-pay | Admitting: Oncology

## 2024-01-12 ENCOUNTER — Other Ambulatory Visit: Payer: Self-pay

## 2024-01-12 ENCOUNTER — Inpatient Hospital Stay: Attending: Oncology

## 2024-01-12 ENCOUNTER — Inpatient Hospital Stay (HOSPITAL_BASED_OUTPATIENT_CLINIC_OR_DEPARTMENT_OTHER): Admitting: Nurse Practitioner

## 2024-01-12 ENCOUNTER — Encounter: Payer: Self-pay | Admitting: Nurse Practitioner

## 2024-01-12 VITALS — BP 142/88 | HR 60 | Temp 98.2°F | Resp 18 | Ht 74.0 in | Wt 236.8 lb

## 2024-01-12 VITALS — BP 129/79 | HR 57 | Temp 97.9°F | Resp 18

## 2024-01-12 DIAGNOSIS — Z452 Encounter for adjustment and management of vascular access device: Secondary | ICD-10-CM | POA: Diagnosis not present

## 2024-01-12 DIAGNOSIS — E039 Hypothyroidism, unspecified: Secondary | ICD-10-CM | POA: Insufficient documentation

## 2024-01-12 DIAGNOSIS — Z79620 Long term (current) use of immunosuppressive biologic: Secondary | ICD-10-CM | POA: Insufficient documentation

## 2024-01-12 DIAGNOSIS — C221 Intrahepatic bile duct carcinoma: Secondary | ICD-10-CM

## 2024-01-12 DIAGNOSIS — Z5112 Encounter for antineoplastic immunotherapy: Secondary | ICD-10-CM | POA: Diagnosis not present

## 2024-01-12 LAB — CBC WITH DIFFERENTIAL (CANCER CENTER ONLY)
Abs Immature Granulocytes: 0.01 K/uL (ref 0.00–0.07)
Basophils Absolute: 0 K/uL (ref 0.0–0.1)
Basophils Relative: 1 %
Eosinophils Absolute: 0.3 K/uL (ref 0.0–0.5)
Eosinophils Relative: 7 %
HCT: 40.6 % (ref 39.0–52.0)
Hemoglobin: 14.2 g/dL (ref 13.0–17.0)
Immature Granulocytes: 0 %
Lymphocytes Relative: 35 %
Lymphs Abs: 1.8 K/uL (ref 0.7–4.0)
MCH: 30.3 pg (ref 26.0–34.0)
MCHC: 35 g/dL (ref 30.0–36.0)
MCV: 86.8 fL (ref 80.0–100.0)
Monocytes Absolute: 0.4 K/uL (ref 0.1–1.0)
Monocytes Relative: 7 %
Neutro Abs: 2.6 K/uL (ref 1.7–7.7)
Neutrophils Relative %: 50 %
Platelet Count: 146 K/uL — ABNORMAL LOW (ref 150–400)
RBC: 4.68 MIL/uL (ref 4.22–5.81)
RDW: 13 % (ref 11.5–15.5)
WBC Count: 5.1 K/uL (ref 4.0–10.5)
nRBC: 0 % (ref 0.0–0.2)

## 2024-01-12 LAB — CMP (CANCER CENTER ONLY)
ALT: 16 U/L (ref 0–44)
AST: 24 U/L (ref 15–41)
Albumin: 4.4 g/dL (ref 3.5–5.0)
Alkaline Phosphatase: 74 U/L (ref 38–126)
Anion gap: 11 (ref 5–15)
BUN: 17 mg/dL (ref 6–20)
CO2: 24 mmol/L (ref 22–32)
Calcium: 9.9 mg/dL (ref 8.9–10.3)
Chloride: 104 mmol/L (ref 98–111)
Creatinine: 1.12 mg/dL (ref 0.61–1.24)
GFR, Estimated: >60 mL/min (ref >60)
Glucose, Bld: 89 mg/dL (ref 70–99)
Potassium: 4 mmol/L (ref 3.5–5.1)
Sodium: 138 mmol/L (ref 135–145)
Total Bilirubin: 0.7 mg/dL (ref 0.0–1.2)
Total Protein: 6.8 g/dL (ref 6.5–8.1)

## 2024-01-12 LAB — TSH: TSH: 9 u[IU]/mL — ABNORMAL HIGH (ref 0.350–4.500)

## 2024-01-12 MED ORDER — SODIUM CHLORIDE 0.9 % IV SOLN
1500.0000 mg | Freq: Once | INTRAVENOUS | Status: AC
  Administered 2024-01-12: 1500 mg via INTRAVENOUS
  Filled 2024-01-12: qty 30

## 2024-01-12 MED ORDER — SODIUM CHLORIDE 0.9 % IV SOLN: Freq: Once | INTRAVENOUS | Status: AC

## 2024-01-12 NOTE — Progress Notes (Signed)
 Patient seen by Olam Ned NP today  Vitals are within treatment parameters:Yes  OK to treat w/BP 142/88  Labs are within treatment parameters: Yes   Treatment plan has been signed: Yes   Per physician team, Patient is ready for treatment and there are NO modifications to the treatment plan.

## 2024-01-12 NOTE — Patient Instructions (Signed)
 [facility] - A DEPT OF [facility]  Discharge Instructions: Thank you for choosing [facility] to provide your oncology and hematology care.   If you have a lab appointment with the Cancer Center, please go directly to the Cancer Center and check in at the registration area.   Wear comfortable clothing and clothing appropriate for easy access to any Portacath or PICC line.   We strive to give you quality time with your provider. You may need to reschedule your appointment if you arrive late (15 or more minutes).  Arriving late affects you and other patients whose appointments are after yours.  Also, if you miss three or more appointments without notifying the office, you may be dismissed from the clinic at the provider's discretion.      For prescription refill requests, have your pharmacy contact our office and allow 72 hours for refills to be completed.    Today you received the following chemotherapy and/or immunotherapy agents: [email or web address]       To help prevent nausea and vomiting after your treatment, we encourage you to take your nausea medication as directed.  BELOW ARE SYMPTOMS THAT SHOULD BE REPORTED IMMEDIATELY: *FEVER GREATER THAN 100.4 F (38 C) OR HIGHER *CHILLS OR SWEATING *NAUSEA AND VOMITING THAT IS NOT CONTROLLED WITH YOUR NAUSEA MEDICATION *UNUSUAL SHORTNESS OF BREATH *UNUSUAL BRUISING OR BLEEDING *URINARY PROBLEMS (pain or burning when urinating, or frequent urination) *BOWEL PROBLEMS (unusual diarrhea, constipation, pain near the anus) TENDERNESS IN MOUTH AND THROAT WITH OR WITHOUT PRESENCE OF ULCERS (sore throat, sores in mouth, or a toothache) UNUSUAL RASH, SWELLING OR PAIN  UNUSUAL VAGINAL DISCHARGE OR ITCHING   Items with * indicate a potential emergency and should be followed up as soon as possible or go to the Emergency Department if any problems should occur.  Please show the CHEMOTHERAPY ALERT CARD or IMMUNOTHERAPY  ALERT CARD at check-in to the Emergency Department and triage nurse.  Should you have questions after your visit or need to cancel or reschedule your appointment, please contact [facility] - A DEPT OF [facility]  Dept: [phone]  and follow the prompts.  Office hours are 8:00 a.m. to 4:30 p.m. Monday - Friday. Please note that voicemails left after 4:00 p.m. may not be returned until the following business day.  We are closed weekends and major holidays. You have access to a nurse at all times for urgent questions. Please call the main number to the clinic Dept: [phone] and follow the prompts.   For any non-urgent questions, you may also contact your provider using MyChart. We now offer e-Visits for anyone 10 and older to request care online for non-urgent symptoms. For details visit [email or web address].   Also download the MyChart app! Go to the app store, search MyChart, open the app, select [facility], and log in with your MyChart username and password.

## 2024-01-12 NOTE — Patient Instructions (Signed)

## 2024-01-12 NOTE — Progress Notes (Signed)
 [facility] OFFICE PROGRESS NOTE   Diagnosis: Cholangiocarcinoma  INTERVAL HISTORY:   Jeffrey Blevins returns as scheduled.  He continues monthly Durvalumab .  No rash or diarrhea.  No nausea or vomiting.  He has a good appetite.  He notes developing hip stiffness during the night or with prolonged sitting.  No red or swollen joints.  Stable numbness in the feet at nighttime.  Objective:  Vital signs in last 24 hours:  Blood pressure (!) 142/88, pulse 60, temperature 98.2 F (36.8 C), temperature source Temporal, resp. rate 18, height 6' 2 (1.88 m), weight 236 lb 12.8 oz (107.4 kg), SpO2 100%.    HEENT: No thrush or ulcers. Resp: Lungs clear bilaterally. Cardio: Regular rate and rhythm. GI: No hepatosplenomegaly.  No mass. Vascular: No leg edema. Skin: No rash. Port-A-Cath without erythema.  Lab Results:  Lab Results  Component Value Date   WBC 5.1 01/12/2024   HGB 14.2 01/12/2024   HCT 40.6 01/12/2024   MCV 86.8 01/12/2024   PLT 146 (L) 01/12/2024   NEUTROABS 2.6 01/12/2024    Imaging:  No results found.  Medications: I have reviewed the patient's current medications.  Assessment/Plan: Liver mass concerning for malignancy -CT abdomen/pelvis with contrast 08/13/2021-2.9 x 2.7 x 2.7 cm hypoenhancing mass in segment 5 near the liver hilum just above hepatic ductal confluence suspicious for primary bile duct malignancy or primary hepatic malignancy -MRCP 08/14/2021-irregular mass of segment 8 of the liver with perilesional enhancement concerning for cholangiocarcinoma -08/14/2021 CA 19.9 was elevated at 76 -08/16/2021 AFP 6.4 -08/16/2021 cytology from bile duct brushing showed atypical cells -08/19/2021 cytology from bile duct brushing showed cells suspicious for malignancy -08/25/2021 CT-guided liver biopsy-moderately differentiated adenocarcinoma, CK7 positive, CDX2 positive in a small population of cells, TTF-1 negative, CK20 negative-consistent with intrahepatic  cholangiocarcinoma versus metastatic disease from a pancreaticobiliary primary -[facility] at [facility] 09/06/2021-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, occlusion of the anterior branch of the right portal vein, periportal enhancement and thickening on delayed imaging potentially due to tumor infiltration, no change in the 2 x 3.2 cm mass in segment 8, mild to moderate left and right intrahepatic biliary dilatation, biliary stent in place, small portacaval and periportal nodes, no evidence of metastatic disease in the chest -Cycle 1 gemcitabine /cisplatin /durvalumab  10/01/2021 -Cycle 2 gemcitabine /cisplatin /durvalumab  10/22/2021 -Cycle 3 gemcitabine /cisplatin /durvalumab  11/11/2021, D1 cisplatin  held due to neuropathy, cisplatin  resumed day 8 -Cycle 4 gemcitabine /cisplatin /durvalumab  12/03/2021 -MRI abdomen 12/15/2021-segment 8 cholangiocarcinoma no longer measurable, residual biliary duct dilation in the anterior right and left hepatic lobes, no evidence of metastatic disease persistent anterior right and new peripheral left portal vein occlusion -Cycle 5 gemcitabine /cisplatin /durvalumab  12/24/2021 -Cycle 6 gemcitabine /cisplatin /durvalumab  01/13/2022 -CTs at [facility] 02/14/2022-fine hypoattenuating masses in the left and right hepatic lobes, more prominent compared to the 12/15/2021 MRI, potentially representing multifocal cholangiocarcinoma, persistent occlusion of left portal and anterior branch of the right portal vein, no extrahepatic lymphadenopathy or metastatic disease -MRI liver 12/19 /2023-1.3 x 0.8 cm hypoenhancing lesion in segment 8, ill-defined area of delayed enhancement in the caudate measuring 1.7 cm, no left liver lesion, 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  Partial hepatectomy anatomy.  Marked improvement postsurgical fluid collections.  Biliary stent in place.  Mild biliary duct dilatation in the central hepatic lobes.  No evidence of metastatic disease in the abdomen/pelvis. -07/27/2022 continue every 4-week Durvalumab  -CTs 10/18/2022-partial left hepatectomy, common hepatic and bile duct stent unchanged with unchanged segmental intrahepatic biliary duct dilation in the remnant left lobe and anterior right lobe.  No evidence of metastatic disease in the chest, abdomen, or pelvis.  Unchanged 0.5 cm subpleural nodule in the right middle lobe -10/19/2022 continue every 4-week Durvalumab  -CTs 02/02/2023-no evidence of disease progression, surgical treatments at the anterior margin of the right liver, left hepatectomy, stable right middle lobe nodule -02/09/2023-every 4-week Durvalumab  continued CTs 08/12/2023-partial left hepatectomy, no evidence of recurrent disease unchanged 0.5 cm right middle lobe subpleural nodule - Every 4-week Durvalumab  continued 2.  Obstructive jaundice -08/16/2021 percutaneous biliary drain placement 3.  Hepatic steatosis 4.  GERD 5.  Hypertension 6.  Normocytic anemia 7.  Enlarged prostate seen on CT 8.  Tobacco use 9.  Enterococcus and Aerococcus bacteremia 08/20/2021-discharged 08/26/2021 to complete outpatient course of Augmentin  10.  Peripheral neuropathy 11.  Port-A-Cath placement 09/23/2021 12.  Biliary drain exchange 09/23/2021; drain capped 10/12/2021 13.  Rigors following biliary drain procedure 09/23/2021, biliary drain culture-Enterobacter Cloacae, Bactrim  x7 days 09/27/2021 14.  Right leg edema and pain 10/18/2021-Doppler negative for DVT 15.  Admission 03/18/2022  with a right upper quadrant abscess, status post drain placement, culture positive for Klebsiella CTs 03/21/2022-no change in moderate volume ascites, interval decrease in subhepatic air-filled collection with catheter in appropriate position, resolved right pneumothorax, no change in bilateral pleural effusions-moderate on right and small on left CTs 04/25/2022 at [facility]-3.1 x 2.0 cm fluid collection in the abdomen and pelvis, decreased in volume and increased in organization; resolution of subhepatic collection; increased partially visualized large right pleural effusion with near complete collapse of the right lower lobe. Drainage catheter removed 04/25/2022 16.  Large right pleural effusion on CT 04/25/2022-thoracentesis 04/29/2022, 1.75 L of pleural fluid removed; culture negative, cytology with reactive mesothelial cells present, acute inflammation 17.  Hypothyroidism, likely secondary to Durvalumab , thyroid  hormone replacement beginning November 2024 18.  06/21/2023 prostate biopsies with benign prostatic tissue    Disposition: Jeffrey Blevins appears stable.  He remains in clinical remission from the cholangiocarcinoma.  He will continue Durvalumab , plan to proceed with treatment today as scheduled.  Restaging CTs prior to next office visit.  We reviewed the overall plan is to continue Durvalumab  through at least the end of this year.  CBC and chemistry panel reviewed.  Labs are adequate for treatment.  We will follow-up on the thyroid  studies.  He will return for follow-up and Durvalumab  in 4 weeks.  We are available to see him sooner if needed.    Olam Ned ANP/GNP-BC   01/12/2024  10:02 AM

## 2024-01-13 LAB — T4: T4, Total: 8.2 ug/dL (ref 4.5–12.0)

## 2024-01-15 ENCOUNTER — Other Ambulatory Visit: Payer: Self-pay

## 2024-01-17 ENCOUNTER — Ambulatory Visit (INDEPENDENT_AMBULATORY_CARE_PROVIDER_SITE_OTHER): Admitting: Urology

## 2024-01-17 ENCOUNTER — Encounter: Payer: Self-pay | Admitting: Urology

## 2024-01-17 VITALS — BP 144/92 | HR 60

## 2024-01-17 DIAGNOSIS — N401 Enlarged prostate with lower urinary tract symptoms: Secondary | ICD-10-CM

## 2024-01-17 DIAGNOSIS — R972 Elevated prostate specific antigen [PSA]: Secondary | ICD-10-CM | POA: Diagnosis not present

## 2024-01-17 DIAGNOSIS — R351 Nocturia: Secondary | ICD-10-CM | POA: Diagnosis not present

## 2024-01-17 DIAGNOSIS — N138 Other obstructive and reflux uropathy: Secondary | ICD-10-CM | POA: Diagnosis not present

## 2024-01-17 LAB — URINALYSIS, ROUTINE W REFLEX MICROSCOPIC
Bilirubin, UA: NEGATIVE
Glucose, UA: NEGATIVE
Ketones, UA: NEGATIVE
Leukocytes,UA: NEGATIVE
Nitrite, UA: NEGATIVE
Protein,UA: NEGATIVE
RBC, UA: NEGATIVE
Specific Gravity, UA: <1.005 — ABNORMAL LOW (ref 1.005–1.030)
Urobilinogen, Ur: 0.2 mg/dL (ref 0.2–1.0)
pH, UA: 6 (ref 5.0–7.5)

## 2024-01-17 NOTE — Patient Instructions (Signed)

## 2024-01-17 NOTE — Progress Notes (Signed)
 01/17/2024 10:58 AM   Jeffrey Blevins 11/06/1965 [id number]  Referring provider: Knute Thersia Bitters, FNP [address] [place],  [place] [address]  Followup elevated PSA   HPI: [name] is a 58yo here for followup for elevated PSA and BPH with nocturia. IPSS 15 QOL 2 on no BPh therapy. Urine stream is fair. He denies straining to urinate. Nocturia 2-3x.  PSA decreased to 5.0 from 5.9. He had a negative prostate biopsy 6 months ago.     PMH: Past Medical History:  Diagnosis Date   Basal cell carcinoma 11/10/2022   rigght preauricular area  needs mohs   Cholangiocarcinoma (HCC)    Dx 07/2021.  Followed by Dr. Cloretta, Dr. Barbaraann at [facility]   GERD (gastroesophageal reflux disease)    Hiatal hernia    History of Barrett's esophagus    per pt dx yrs ago, but with last egd none noted 05/ 2021   History of esophageal dilatation    per pt hx several times last one approx. 2009 then 05/ 2021  for stricture   Hypertension    followed by pcp   Raynauds phenomenon    Right hydrocele     Surgical History: Past Surgical History:  Procedure Laterality Date   COLONOSCOPY WITH ESOPHAGOGASTRODUODENOSCOPY (EGD)  07/16/2019   last one   INGUINAL HERNIA REPAIR Bilateral    last one 1990s   IR CHOLANGIOGRAM EXISTING TUBE  10/12/2021   IR ENDOLUMINAL BX OF BILIARY TREE  08/19/2021   IR EXCHANGE BILIARY DRAIN  08/19/2021   IR EXCHANGE BILIARY DRAIN  09/23/2021   IR EXCHANGE BILIARY DRAIN  11/04/2021   IR IMAGING GUIDED PORT INSERTION  09/23/2021   IR INT EXT BILIARY DRAIN WITH CHOLANGIOGRAM  08/16/2021   SPERMATOCELECTOMY Right 10/09/2020   Procedure: SPERMATOCELECTOMY;  Surgeon: Nieves Cough, MD;  Location: [facility];  Service: Urology;  Laterality: Right;    Home Medications:  Allergies as of 01/17/2024       Reactions   Codeine Nausea And Vomiting, Rash        Medication List        Accurate as of January 17, 2024 10:58 AM. If you have  any questions, ask your nurse or doctor.          amLODipine  5 MG tablet Commonly known as: Norvasc  Take 1 tablet (5 mg total) by mouth daily.   esomeprazole  20 MG capsule Commonly known as: NEXIUM  Take 1 capsule (20 mg total) by mouth daily as needed.   ibuprofen  200 MG tablet Commonly known as: ADVIL  Take 400 mg by mouth 3 (three) times daily as needed for headache (pain).   levothyroxine  50 MCG tablet Commonly known as: SYNTHROID  Take 1 tablet (50 mcg total) by mouth daily before breakfast.   tadalafil  20 MG tablet Commonly known as: CIALIS  Take 1 tablet (20 mg total) by mouth at bedtime as needed.        Allergies:  Allergies  Allergen Reactions   Codeine Nausea And Vomiting and Rash    Family History: Family History  Problem Relation Age of Onset   Breast cancer Mother    Stroke Father    Breast cancer Sister    Cardiomyopathy Brother        Cardiomegaly    Social History:  reports that he has quit smoking. His smoking use included cigarettes. He has been exposed to tobacco smoke. He has never used smokeless tobacco. He reports current alcohol use of  about 4.0 standard drinks of alcohol per week. He reports that he does not use drugs.  ROS: All other review of systems were reviewed and are negative except what is noted above in HPI  Physical Exam: BP (!) 144/92   Pulse 60   Constitutional:  Alert and oriented, No acute distress. HEENT: [place] AT, moist mucus membranes.  Trachea midline, no masses. Cardiovascular: No clubbing, cyanosis, or edema. Respiratory: Normal respiratory effort, no increased work of breathing. GI: Abdomen is soft, nontender, nondistended, no abdominal masses GU: No CVA tenderness.  Lymph: No cervical or inguinal lymphadenopathy. Skin: No rashes, bruises or suspicious lesions. Neurologic: Grossly intact, no focal deficits, moving all 4 extremities. Psychiatric: Normal mood and affect.  Laboratory Data: Lab Results  Component  Value Date   WBC 5.1 01/12/2024   HGB 14.2 01/12/2024   HCT 40.6 01/12/2024   MCV 86.8 01/12/2024   PLT 146 (L) 01/12/2024    Lab Results  Component Value Date   CREATININE 1.12 01/12/2024    No results found for: PSA  No results found for: TESTOSTERONE  Lab Results  Component Value Date   HGBA1C 5.5 01/10/2023    Urinalysis    Component Value Date/Time   COLORURINE AMBER (A) 08/20/2021 0729   APPEARANCEUR Clear 04/26/2023 1425   LABSPEC 1.021 08/20/2021 0729   PHURINE 5.0 08/20/2021 0729   GLUCOSEU Negative 04/26/2023 1425   HGBUR SMALL (A) 08/20/2021 0729   BILIRUBINUR Negative 04/26/2023 1425   KETONESUR NEGATIVE 08/20/2021 0729   PROTEINUR Negative 04/26/2023 1425   PROTEINUR 30 (A) 08/20/2021 0729   NITRITE Negative 04/26/2023 1425   NITRITE NEGATIVE 08/20/2021 0729   LEUKOCYTESUR Negative 04/26/2023 1425   LEUKOCYTESUR NEGATIVE 08/20/2021 0729    Lab Results  Component Value Date   LABMICR Comment 04/26/2023   BACTERIA RARE (A) 08/20/2021    Pertinent Imaging:  No results found for this or any previous visit.  No results found for this or any previous visit.  No results found for this or any previous visit.  No results found for this or any previous visit.  No results found for this or any previous visit.  No results found for this or any previous visit.  No results found for this or any previous visit.  No results found for this or any previous visit.   Assessment & Plan:    1. Elevated PSA (Primary) Followup 6 months with PSA - Urinalysis, Routine w reflex microscopic  2. Benign prostatic hyperplasia with urinary obstruction -patient defers therapy at this time  3. Nocturia Patient defers therapy at this time   No follow-ups on file.  Jeffrey Clara, MD  [facility] [place]

## 2024-01-18 ENCOUNTER — Other Ambulatory Visit: Payer: Self-pay

## 2024-01-24 ENCOUNTER — Inpatient Hospital Stay

## 2024-01-24 ENCOUNTER — Ambulatory Visit (HOSPITAL_BASED_OUTPATIENT_CLINIC_OR_DEPARTMENT_OTHER)
Admission: RE | Admit: 2024-01-24 | Discharge: 2024-01-24 | Disposition: A | Discharge status: Home / Self Care | Source: Ambulatory Visit | Attending: Nurse Practitioner | Admitting: Nurse Practitioner

## 2024-01-24 DIAGNOSIS — E039 Hypothyroidism, unspecified: Secondary | ICD-10-CM | POA: Diagnosis not present

## 2024-01-24 DIAGNOSIS — C221 Intrahepatic bile duct carcinoma: Secondary | ICD-10-CM | POA: Insufficient documentation

## 2024-01-24 DIAGNOSIS — Q631 Lobulated, fused and horseshoe kidney: Secondary | ICD-10-CM | POA: Diagnosis not present

## 2024-01-24 DIAGNOSIS — R911 Solitary pulmonary nodule: Secondary | ICD-10-CM | POA: Diagnosis not present

## 2024-01-24 DIAGNOSIS — Z452 Encounter for adjustment and management of vascular access device: Secondary | ICD-10-CM | POA: Diagnosis not present

## 2024-01-24 DIAGNOSIS — Z5112 Encounter for antineoplastic immunotherapy: Secondary | ICD-10-CM | POA: Diagnosis not present

## 2024-01-24 DIAGNOSIS — K429 Umbilical hernia without obstruction or gangrene: Secondary | ICD-10-CM | POA: Diagnosis not present

## 2024-01-24 DIAGNOSIS — Z79620 Long term (current) use of immunosuppressive biologic: Secondary | ICD-10-CM | POA: Diagnosis not present

## 2024-01-24 MED ORDER — IOHEXOL 300 MG/ML  SOLN
100.0000 mL | Freq: Once | INTRAMUSCULAR | Status: AC | PRN
  Administered 2024-01-24: 100 mL via INTRAVENOUS

## 2024-01-24 MED ORDER — HEPARIN SOD (PORK) LOCK FLUSH 100 UNIT/ML IV SOLN
500.0000 [IU] | Freq: Once | INTRAVENOUS | Status: AC
  Administered 2024-01-24: 500 [IU] via INTRAVENOUS

## 2024-01-24 NOTE — Patient Instructions (Signed)

## 2024-02-04 ENCOUNTER — Other Ambulatory Visit: Payer: Self-pay | Admitting: Oncology

## 2024-02-06 ENCOUNTER — Other Ambulatory Visit (HOSPITAL_BASED_OUTPATIENT_CLINIC_OR_DEPARTMENT_OTHER): Payer: Self-pay

## 2024-02-06 ENCOUNTER — Other Ambulatory Visit: Payer: Self-pay

## 2024-02-06 ENCOUNTER — Other Ambulatory Visit: Payer: Self-pay | Admitting: Oncology

## 2024-02-06 DIAGNOSIS — C221 Intrahepatic bile duct carcinoma: Secondary | ICD-10-CM

## 2024-02-06 MED ORDER — AMLODIPINE BESYLATE 5 MG PO TABS
5.0000 mg | ORAL_TABLET | Freq: Every day | ORAL | 5 refills | Status: AC
  Filled 2024-02-06: qty 30, 30d supply, fill #0
  Filled 2024-03-15: qty 30, 30d supply, fill #1

## 2024-02-08 ENCOUNTER — Inpatient Hospital Stay (HOSPITAL_BASED_OUTPATIENT_CLINIC_OR_DEPARTMENT_OTHER): Admitting: Oncology

## 2024-02-08 ENCOUNTER — Inpatient Hospital Stay

## 2024-02-08 ENCOUNTER — Encounter: Payer: Self-pay | Admitting: Oncology

## 2024-02-08 ENCOUNTER — Inpatient Hospital Stay: Attending: Oncology

## 2024-02-08 VITALS — BP 141/91 | HR 65 | Temp 97.9°F | Resp 18 | Ht 74.0 in | Wt 237.1 lb

## 2024-02-08 VITALS — BP 140/90 | HR 56 | Temp 98.4°F | Resp 16

## 2024-02-08 DIAGNOSIS — C221 Intrahepatic bile duct carcinoma: Secondary | ICD-10-CM | POA: Diagnosis present

## 2024-02-08 DIAGNOSIS — Z79620 Long term (current) use of immunosuppressive biologic: Secondary | ICD-10-CM | POA: Insufficient documentation

## 2024-02-08 DIAGNOSIS — Z5112 Encounter for antineoplastic immunotherapy: Secondary | ICD-10-CM | POA: Diagnosis present

## 2024-02-08 LAB — CMP (CANCER CENTER ONLY)
ALT: 24 U/L (ref 0–44)
AST: 28 U/L (ref 15–41)
Albumin: 4.4 g/dL (ref 3.5–5.0)
Alkaline Phosphatase: 89 U/L (ref 38–126)
Anion gap: 10 (ref 5–15)
BUN: 17 mg/dL (ref 6–20)
CO2: 26 mmol/L (ref 22–32)
Calcium: 9.7 mg/dL (ref 8.9–10.3)
Chloride: 103 mmol/L (ref 98–111)
Creatinine: 1.06 mg/dL (ref 0.61–1.24)
GFR, Estimated: >60 mL/min (ref >60)
Glucose, Bld: 97 mg/dL (ref 70–99)
Potassium: 4.1 mmol/L (ref 3.5–5.1)
Sodium: 139 mmol/L (ref 135–145)
Total Bilirubin: 0.7 mg/dL (ref 0.0–1.2)
Total Protein: 7.4 g/dL (ref 6.5–8.1)

## 2024-02-08 LAB — CBC WITH DIFFERENTIAL (CANCER CENTER ONLY)
Abs Immature Granulocytes: 0.03 K/uL (ref 0.00–0.07)
Basophils Absolute: 0 K/uL (ref 0.0–0.1)
Basophils Relative: 1 %
Eosinophils Absolute: 0.6 K/uL — ABNORMAL HIGH (ref 0.0–0.5)
Eosinophils Relative: 9 %
HCT: 41 % (ref 39.0–52.0)
Hemoglobin: 14.5 g/dL (ref 13.0–17.0)
Immature Granulocytes: 0 %
Lymphocytes Relative: 25 %
Lymphs Abs: 1.9 K/uL (ref 0.7–4.0)
MCH: 29.7 pg (ref 26.0–34.0)
MCHC: 35.4 g/dL (ref 30.0–36.0)
MCV: 83.8 fL (ref 80.0–100.0)
Monocytes Absolute: 0.5 K/uL (ref 0.1–1.0)
Monocytes Relative: 6 %
Neutro Abs: 4.3 K/uL (ref 1.7–7.7)
Neutrophils Relative %: 59 %
Platelet Count: 166 K/uL (ref 150–400)
RBC: 4.89 MIL/uL (ref 4.22–5.81)
RDW: 12.5 % (ref 11.5–15.5)
WBC Count: 7.3 K/uL (ref 4.0–10.5)
nRBC: 0 % (ref 0.0–0.2)

## 2024-02-08 LAB — TSH: TSH: 11 u[IU]/mL — ABNORMAL HIGH (ref 0.350–4.500)

## 2024-02-08 MED ORDER — SODIUM CHLORIDE 0.9 % IV SOLN
1500.0000 mg | Freq: Once | INTRAVENOUS | Status: AC
  Administered 2024-02-08: 1500 mg via INTRAVENOUS
  Filled 2024-02-08: qty 30

## 2024-02-08 MED ORDER — SODIUM CHLORIDE 0.9 % IV SOLN: Freq: Once | INTRAVENOUS | Status: AC

## 2024-02-08 NOTE — Progress Notes (Signed)
 [facility] OFFICE PROGRESS NOTE   Diagnosis: Cholangiocarcinoma  INTERVAL HISTORY:   Jeffrey Blevins returns as scheduled.  He completed another treatment Durvalumab  01/12/2024.  No rash or diarrhea.  He generally feels well.  He has an upper respiratory infection with a cough and rhinorrhea.  His symptoms have improved.  Objective:  Vital signs in last 24 hours:  Blood pressure (!) 141/91, pulse 65, temperature 97.9 F (36.6 C), temperature source Temporal, resp. rate 18, height 6' 2 (1.88 m), weight 237 lb 1.6 oz (107.5 kg), SpO2 99%.     Lymphatics: No cervical, supraclavicular, axillary, or inguinal nodes Resp: Lungs clear bilaterally Cardio: Regular rate and rhythm GI: Mild tenderness throughout the right abdomen, no hepatosplenomegaly, no mass Vascular: No leg edema   Portacath/PICC-without erythema  Lab Results:  Lab Results  Component Value Date   WBC 7.3 02/08/2024   HGB 14.5 02/08/2024   HCT 41.0 02/08/2024   MCV 83.8 02/08/2024   PLT 166 02/08/2024   NEUTROABS 4.3 02/08/2024    CMP  Lab Results  Component Value Date   NA 139 02/08/2024   K 4.1 02/08/2024   CL 103 02/08/2024   CO2 26 02/08/2024   GLUCOSE 97 02/08/2024   BUN 17 02/08/2024   CREATININE 1.06 02/08/2024   CALCIUM 9.7 02/08/2024   PROT 7.4 02/08/2024   ALBUMIN 4.4 02/08/2024   AST 28 02/08/2024   ALT 24 02/08/2024   ALKPHOS 89 02/08/2024   BILITOT 0.7 02/08/2024   GFRNONAA >60 02/08/2024    Lab Results  Component Value Date   RJW800 11 09/21/2023    Lab Results  Component Value Date   INR 1.1 08/25/2021   LABPROT 13.9 08/25/2021    Imaging:  No results found.  Medications: I have reviewed the patient's current medications.   Assessment/Plan: Liver mass concerning for malignancy -CT abdomen/pelvis with contrast 08/13/2021-2.9 x 2.7 x 2.7 cm hypoenhancing mass in segment 5 near the liver hilum just above hepatic ductal confluence suspicious for primary bile  duct malignancy or primary hepatic malignancy -MRCP 08/14/2021-irregular mass of segment 8 of the liver with perilesional enhancement concerning for cholangiocarcinoma -08/14/2021 CA 19.9 was elevated at 75 -08/16/2021 AFP 6.4 -08/16/2021 cytology from bile duct brushing showed atypical cells -08/19/2021 cytology from bile duct brushing showed cells suspicious for malignancy -08/25/2021 CT-guided liver biopsy-moderately differentiated adenocarcinoma, CK7 positive, CDX2 positive in a small population of cells, TTF-1 negative, CK20 negative-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 -CTs at [facility] 09/06/2021-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, occlusion of the anterior branch of the right portal vein, periportal enhancement and thickening on delayed imaging potentially due to tumor infiltration, no change in the 2 x 3.2 cm mass in segment 8, mild to moderate left and right intrahepatic biliary dilatation, biliary stent in place, small portacaval and periportal nodes, no evidence of metastatic disease in the chest -Cycle 1 gemcitabine /cisplatin /durvalumab  10/01/2021 -Cycle 2 gemcitabine /cisplatin /durvalumab  10/22/2021 -Cycle 3 gemcitabine /cisplatin /durvalumab  11/11/2021, D1 cisplatin  held due to neuropathy, cisplatin  resumed day 8 -Cycle 4 gemcitabine /cisplatin /durvalumab  12/03/2021 -MRI abdomen 12/15/2021-segment 8 cholangiocarcinoma no longer measurable, residual biliary duct dilation in the anterior right and left hepatic lobes, no evidence of metastatic disease persistent anterior right and new peripheral left portal vein occlusion -Cycle 5 gemcitabine /cisplatin /durvalumab  12/24/2021 -Cycle 6 gemcitabine /cisplatin /durvalumab  01/13/2022 -CTs at [facility] 02/14/2022-fine hypoattenuating masses in the left and right hepatic lobes, more prominent compared to the 12/15/2021 MRI, potentially representing multifocal cholangiocarcinoma, persistent  occlusion of left portal and  anterior branch of the right portal vein, no extrahepatic lymphadenopathy or metastatic disease -MRI liver 12/19 /2023-1.3 x 0.8 cm hypoenhancing lesion in segment 8, ill-defined area of delayed enhancement in the caudate measuring 1.7 cm, no left liver lesion, 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  Partial hepatectomy anatomy.  Marked improvement postsurgical fluid collections.  Biliary stent in place.  Mild biliary duct dilatation in the central hepatic lobes.  No evidence of metastatic disease in the abdomen/pelvis. -07/27/2022 continue every 4-week Durvalumab  -CTs 10/18/2022-partial left hepatectomy, common hepatic and bile duct stent unchanged with unchanged segmental intrahepatic biliary duct dilation in the remnant left lobe and anterior right lobe.  No evidence of metastatic disease in the chest, abdomen, or pelvis.  Unchanged 0.5 cm subpleural nodule in the right middle lobe -10/19/2022 continue every 4-week Durvalumab  -CTs 02/02/2023-no evidence of disease progression, surgical treatments at the anterior margin of the right liver, left hepatectomy, stable right middle lobe nodule -02/09/2023-every 4-week Durvalumab  continued -CTs 08/12/2023-partial left hepatectomy, no evidence of recurrent disease unchanged 0.5 cm right middle lobe subpleural nodule -Every 4-week Durvalumab  continued -CTs 01/24/2024: No evidence of recurrent disease, unchanged 5 mm right middle lobe  subpleural nodule - Final Durvalumab  02/08/2024 2.  Obstructive jaundice -08/16/2021 percutaneous biliary drain placement 3.  Hepatic steatosis 4.  GERD 5.  Hypertension 6.  Normocytic anemia 7.  Enlarged prostate seen on CT 8.  Tobacco use 9.  Enterococcus and Aerococcus bacteremia 08/20/2021-discharged 08/26/2021 to complete outpatient course of Augmentin  10.  Peripheral neuropathy 11.  Port-A-Cath placement 09/23/2021 12.  Biliary drain exchange 09/23/2021; drain capped 10/12/2021 13.  Rigors following biliary drain procedure 09/23/2021, biliary drain culture-Enterobacter Cloacae, Bactrim  x7 days 09/27/2021 14.  Right leg edema and pain 10/18/2021-Doppler negative for DVT 15.  Admission 03/18/2022 with a right upper quadrant abscess, status post drain placement, culture positive for Klebsiella CTs 03/21/2022-no change in moderate volume ascites, interval decrease in subhepatic air-filled collection with catheter in appropriate position, resolved right pneumothorax, no change in bilateral pleural effusions-moderate on right and small on left CTs 04/25/2022 at [facility]-3.1 x 2.0 cm fluid collection in the abdomen and pelvis, decreased in volume and increased in organization; resolution of subhepatic collection; increased partially visualized large right pleural effusion with near complete collapse of the right lower lobe. Drainage catheter removed 04/25/2022 16.  Large right pleural effusion on CT 04/25/2022-thoracentesis 04/29/2022, 1.75 L of pleural fluid removed; culture negative, cytology with reactive mesothelial cells present, acute inflammation 17.  Hypothyroidism, likely secondary to Durvalumab , thyroid  hormone replacement beginning November 2024 18.  06/21/2023 prostate biopsies with benign prostatic tissue      Disposition: Jeffrey. Blevins is in clinical remission from cholangiocarcinoma.  He has been maintained on durvalumab  consistently since March 2024 and he also received 6 months of durvalumab  in  2023.  He will complete a final treatment with durvalumab  today.  He will then be followed with observation.  A Port-A-Cath remains in place.  Jeffrey Blevins will return for an office visit and Port-A-Cath flush in 6 weeks.  Arley Hof, MD  02/08/2024  8:41 AM

## 2024-02-08 NOTE — Progress Notes (Signed)
 Patient seen by Dr. Arley Hof today  Vitals are within treatment parameters:Yes OK to proceed w/BP 141/91  Labs are within treatment parameters: Yes   Treatment plan has been signed: Yes   Per physician team, Patient is ready for treatment and there are NO modifications to the treatment plan.

## 2024-02-08 NOTE — Patient Instructions (Signed)
 [facility] - A DEPT OF [facility]  Discharge Instructions: Thank you for choosing [facility] to provide your oncology and hematology care.   If you have a lab appointment with the Cancer Center, please go directly to the Cancer Center and check in at the registration area.   Wear comfortable clothing and clothing appropriate for easy access to any Portacath or PICC line.   We strive to give you quality time with your provider. You may need to reschedule your appointment if you arrive late (15 or more minutes).  Arriving late affects you and other patients whose appointments are after yours.  Also, if you miss three or more appointments without notifying the office, you may be dismissed from the clinic at the provider's discretion.      For prescription refill requests, have your pharmacy contact our office and allow 72 hours for refills to be completed.    Today you received the following chemotherapy and/or immunotherapy agents: [email or web address]       To help prevent nausea and vomiting after your treatment, we encourage you to take your nausea medication as directed.  BELOW ARE SYMPTOMS THAT SHOULD BE REPORTED IMMEDIATELY: *FEVER GREATER THAN 100.4 F (38 C) OR HIGHER *CHILLS OR SWEATING *NAUSEA AND VOMITING THAT IS NOT CONTROLLED WITH YOUR NAUSEA MEDICATION *UNUSUAL SHORTNESS OF BREATH *UNUSUAL BRUISING OR BLEEDING *URINARY PROBLEMS (pain or burning when urinating, or frequent urination) *BOWEL PROBLEMS (unusual diarrhea, constipation, pain near the anus) TENDERNESS IN MOUTH AND THROAT WITH OR WITHOUT PRESENCE OF ULCERS (sore throat, sores in mouth, or a toothache) UNUSUAL RASH, SWELLING OR PAIN  UNUSUAL VAGINAL DISCHARGE OR ITCHING   Items with * indicate a potential emergency and should be followed up as soon as possible or go to the Emergency Department if any problems should occur.  Please show the CHEMOTHERAPY ALERT CARD or IMMUNOTHERAPY  ALERT CARD at check-in to the Emergency Department and triage nurse.  Should you have questions after your visit or need to cancel or reschedule your appointment, please contact [facility] - A DEPT OF [facility]  Dept: [phone]  and follow the prompts.  Office hours are 8:00 a.m. to 4:30 p.m. Monday - Friday. Please note that voicemails left after 4:00 p.m. may not be returned until the following business day.  We are closed weekends and major holidays. You have access to a nurse at all times for urgent questions. Please call the main number to the clinic Dept: [phone] and follow the prompts.   For any non-urgent questions, you may also contact your provider using MyChart. We now offer e-Visits for anyone 10 and older to request care online for non-urgent symptoms. For details visit [email or web address].   Also download the MyChart app! Go to the app store, search MyChart, open the app, select [facility], and log in with your MyChart username and password.

## 2024-02-09 LAB — T4: T4, Total: 7.9 ug/dL (ref 4.5–12.0)

## 2024-02-29 ENCOUNTER — Encounter: Payer: Self-pay | Admitting: Oncology

## 2024-03-01 ENCOUNTER — Other Ambulatory Visit: Payer: Self-pay | Admitting: Oncology

## 2024-03-05 ENCOUNTER — Other Ambulatory Visit (HOSPITAL_BASED_OUTPATIENT_CLINIC_OR_DEPARTMENT_OTHER): Payer: Self-pay

## 2024-03-05 DIAGNOSIS — E032 Hypothyroidism due to medicaments and other exogenous substances: Secondary | ICD-10-CM

## 2024-03-05 DIAGNOSIS — C221 Intrahepatic bile duct carcinoma: Secondary | ICD-10-CM

## 2024-03-05 MED ORDER — LEVOTHYROXINE SODIUM 50 MCG PO TABS
50.0000 ug | ORAL_TABLET | Freq: Every day | ORAL | 3 refills | Status: AC
  Filled 2024-03-15: qty 30, 30d supply, fill #0

## 2024-03-07 ENCOUNTER — Inpatient Hospital Stay

## 2024-03-07 ENCOUNTER — Inpatient Hospital Stay: Admitting: Nurse Practitioner

## 2024-03-15 ENCOUNTER — Ambulatory Visit (HOSPITAL_BASED_OUTPATIENT_CLINIC_OR_DEPARTMENT_OTHER)

## 2024-03-15 ENCOUNTER — Inpatient Hospital Stay: Attending: Oncology

## 2024-03-15 ENCOUNTER — Other Ambulatory Visit: Payer: Self-pay

## 2024-03-15 ENCOUNTER — Inpatient Hospital Stay: Admitting: Nurse Practitioner

## 2024-03-15 ENCOUNTER — Encounter: Payer: Self-pay | Admitting: Nurse Practitioner

## 2024-03-15 ENCOUNTER — Other Ambulatory Visit (HOSPITAL_BASED_OUTPATIENT_CLINIC_OR_DEPARTMENT_OTHER): Payer: Self-pay

## 2024-03-15 ENCOUNTER — Encounter: Payer: Self-pay | Admitting: Oncology

## 2024-03-15 ENCOUNTER — Other Ambulatory Visit: Payer: Self-pay | Admitting: Nurse Practitioner

## 2024-03-15 VITALS — BP 139/85 | HR 69 | Temp 98.2°F | Resp 18 | Ht 74.0 in | Wt 237.5 lb

## 2024-03-15 DIAGNOSIS — R1310 Dysphagia, unspecified: Secondary | ICD-10-CM

## 2024-03-15 DIAGNOSIS — S99922A Unspecified injury of left foot, initial encounter: Secondary | ICD-10-CM

## 2024-03-15 DIAGNOSIS — C221 Intrahepatic bile duct carcinoma: Secondary | ICD-10-CM

## 2024-03-15 LAB — TSH: TSH: 4.52 u[IU]/mL — ABNORMAL HIGH (ref 0.350–4.500)

## 2024-03-15 NOTE — Patient Instructions (Signed)

## 2024-03-15 NOTE — Progress Notes (Signed)
 " [facility] OFFICE PROGRESS NOTE   Diagnosis: Cholangiocarcinoma  INTERVAL HISTORY:   Jeffrey Blevins returns for follow-up.  He completed a final treatment with Durvalumab  02/08/2024.  No rash or diarrhea.  He has a good appetite.  No nausea or vomiting.  No abdominal pain.    He reports a history of dysphagia requiring esophageal dilatation.  He recently noted recurrence of intermittent similar dysphagia.  He has seen Dr. Rollin for this in the past.  About a week ago he struck the left big toe on the corner of his bed.  Since then he has noted pain, swelling and bruising.  He wonders if he fractured his foot.  Objective:  Vital signs in last 24 hours:  Blood pressure 139/85, pulse 69, temperature 98.2 F (36.8 C), temperature source Temporal, resp. rate 18, height 6' 2 (1.88 m), weight 237 lb 8 oz (107.7 kg), SpO2 98%.    HEENT: No thrush or ulcers. Lymphatics: No palpable cervical, supraclavicular, axillary or inguinal lymph nodes. Resp: Lungs clear bilaterally. Cardio: Regular rate and rhythm. GI: No hepatosplenomegaly.  No mass.  Nontender. Vascular: Trace edema left foot extending to the big toe.  Resolving ecchymosis distal foot. Neuro: Alert and oriented. Skin: No rash.  Resolving ecchymosis distal left foot/big toe. Port-A-Cath without erythema.  Lab Results:  Lab Results  Component Value Date   WBC 7.3 02/08/2024   HGB 14.5 02/08/2024   HCT 41.0 02/08/2024   MCV 83.8 02/08/2024   PLT 166 02/08/2024   NEUTROABS 4.3 02/08/2024    Imaging:  No results found.  Medications: I have reviewed the patient's current medications.  Assessment/Plan: Liver mass concerning for malignancy -CT abdomen/pelvis with contrast 08/13/2021-2.9 x 2.7 x 2.7 cm hypoenhancing mass in segment 5 near the liver hilum just above hepatic ductal confluence suspicious for primary bile duct malignancy or primary hepatic malignancy -MRCP 08/14/2021-irregular mass of segment 8 of the  liver with perilesional enhancement concerning for cholangiocarcinoma -08/14/2021 CA 19.9 was elevated at 76 -08/16/2021 AFP 6.4 -08/16/2021 cytology from bile duct brushing showed atypical cells -08/19/2021 cytology from bile duct brushing showed cells suspicious for malignancy -08/25/2021 CT-guided liver biopsy-moderately differentiated adenocarcinoma, CK7 positive, CDX2 positive in a small population of cells, TTF-1 negative, CK20 negative-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 -CTs at [facility] 09/06/2021-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, occlusion of the anterior branch of the right portal vein, periportal enhancement and thickening on delayed imaging potentially due to tumor infiltration, no change in the 2 x 3.2 cm mass in segment 8, mild to moderate left and right intrahepatic biliary dilatation, biliary stent in place, small portacaval and periportal nodes, no evidence of metastatic disease in the chest -Cycle 1 gemcitabine /cisplatin /durvalumab  10/01/2021 -Cycle 2 gemcitabine /cisplatin /durvalumab  10/22/2021 -Cycle 3 gemcitabine /cisplatin /durvalumab  11/11/2021, D1 cisplatin  held due to neuropathy, cisplatin  resumed day 8 -Cycle 4 gemcitabine /cisplatin /durvalumab  12/03/2021 -MRI abdomen 12/15/2021-segment 8 cholangiocarcinoma no longer measurable, residual biliary duct dilation in the anterior right and left hepatic lobes, no evidence of metastatic disease persistent anterior right and new peripheral left portal vein occlusion -Cycle 5 gemcitabine /cisplatin /durvalumab  12/24/2021 -Cycle 6 gemcitabine /cisplatin /durvalumab  01/13/2022 -CTs at [facility] 02/14/2022-fine hypoattenuating masses in the left and right hepatic lobes, more prominent compared to the 12/15/2021 MRI, potentially representing multifocal cholangiocarcinoma, persistent occlusion of left portal and anterior branch of the right portal vein, no extrahepatic lymphadenopathy or  metastatic disease -MRI liver 12/19 /2023-1.3 x 0.8 cm hypoenhancing lesion in segment 8, ill-defined area of delayed enhancement in  the caudate measuring 1.7 cm, no left liver lesion, 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  Partial hepatectomy anatomy.  Marked improvement postsurgical fluid collections.  Biliary stent in place.  Mild biliary duct dilatation in the central hepatic lobes.  No evidence of metastatic disease in the abdomen/pelvis. -07/27/2022 continue every 4-week Durvalumab  -CTs 10/18/2022-partial left hepatectomy, common hepatic and bile duct stent unchanged with unchanged segmental intrahepatic biliary duct dilation in the remnant left lobe and anterior right lobe.  No evidence of metastatic disease in the chest, abdomen, or pelvis.  Unchanged 0.5 cm subpleural nodule in the right middle lobe -10/19/2022 continue every 4-week Durvalumab  -CTs 02/02/2023-no evidence of disease progression, surgical treatments at the anterior margin of the right liver, left hepatectomy, stable right middle lobe nodule -02/09/2023-every 4-week Durvalumab  continued -CTs 08/12/2023-partial left hepatectomy, no evidence of recurrent disease unchanged 0.5 cm right middle lobe subpleural nodule -Every 4-week Durvalumab  continued -CTs 01/24/2024: No evidence of recurrent disease, unchanged 5 mm right middle lobe subpleural nodule - Final Durvalumab  02/08/2024 2.  Obstructive jaundice -08/16/2021 percutaneous  biliary drain placement 3.  Hepatic steatosis 4.  GERD 5.  Hypertension 6.  Normocytic anemia 7.  Enlarged prostate seen on CT 8.  Tobacco use 9.  Enterococcus and Aerococcus bacteremia 08/20/2021-discharged 08/26/2021 to complete outpatient course of Augmentin  10.  Peripheral neuropathy 11.  Port-A-Cath placement 09/23/2021 12.  Biliary drain exchange 09/23/2021; drain capped 10/12/2021 13.  Rigors following biliary drain procedure 09/23/2021, biliary drain culture-Enterobacter Cloacae, Bactrim  x7 days 09/27/2021 14.  Right leg edema and pain 10/18/2021-Doppler negative for DVT 15.  Admission 03/18/2022 with a right upper quadrant abscess, status post drain placement, culture positive for Klebsiella CTs 03/21/2022-no change in moderate volume ascites, interval decrease in subhepatic air-filled collection with catheter in appropriate position, resolved right pneumothorax, no change in bilateral pleural effusions-moderate on right and small on left CTs 04/25/2022 at [facility]-3.1 x 2.0 cm fluid collection in the abdomen and pelvis, decreased in volume and increased in organization; resolution of subhepatic collection; increased partially visualized large right pleural effusion with near complete collapse of the right lower lobe. Drainage catheter removed 04/25/2022 16.  Large right pleural effusion on CT 04/25/2022-thoracentesis 04/29/2022, 1.75 L of pleural fluid removed; culture negative, cytology with reactive mesothelial cells present, acute inflammation 17.  Hypothyroidism, likely secondary to Durvalumab , thyroid  hormone replacement beginning November 2024 18.  06/21/2023 prostate biopsies with benign prostatic tissue  Disposition: Jeffrey Blevins remains in clinical remission from cholangiocarcinoma.  Plan for continued observation.  We will follow-up on the CA 19-9 from today.  Referral placed to Dr. Rollin for evaluation of recurrent dysphagia.  He has seen Dr. Rollin for this issue in the past.  Plain x-ray of the  left foot today to evaluate for fracture following recent minor trauma.  He will return for lab/port flush/office visit in 6 weeks.  We are available to see him sooner if needed.    Olam Ned ANP/GNP-BC   03/15/2024  11:34 AM        "

## 2024-03-16 LAB — T4: T4, Total: 8.3 ug/dL (ref 4.5–12.0)

## 2024-03-16 LAB — CANCER ANTIGEN 19-9: CA 19-9: 8 U/mL (ref 0–35)

## 2024-03-18 ENCOUNTER — Encounter: Payer: Self-pay | Admitting: *Deleted

## 2024-03-18 NOTE — Progress Notes (Signed)
 Referral faxed to Dr Rollin for follow up care [phone]

## 2024-03-19 ENCOUNTER — Encounter: Payer: Self-pay | Admitting: Oncology

## 2024-03-21 ENCOUNTER — Telehealth: Payer: Self-pay | Admitting: *Deleted

## 2024-03-21 NOTE — Telephone Encounter (Signed)
 LVM that left foot xray was negative for fracture.

## 2024-03-21 NOTE — Telephone Encounter (Signed)
-----   Message from Olam Ned, NP sent at 03/21/2024  4:08 PM EST ----- Please let him know that left foot x-ray was negative for fracture.

## 2024-04-02 ENCOUNTER — Encounter: Payer: Self-pay | Admitting: Nurse Practitioner

## 2024-04-03 ENCOUNTER — Other Ambulatory Visit (HOSPITAL_BASED_OUTPATIENT_CLINIC_OR_DEPARTMENT_OTHER): Payer: Self-pay | Admitting: Family Medicine

## 2024-04-03 DIAGNOSIS — C221 Intrahepatic bile duct carcinoma: Secondary | ICD-10-CM

## 2024-04-03 DIAGNOSIS — R1310 Dysphagia, unspecified: Secondary | ICD-10-CM

## 2024-04-25 ENCOUNTER — Inpatient Hospital Stay

## 2024-04-25 ENCOUNTER — Inpatient Hospital Stay: Admitting: Oncology

## 2024-05-15 IMAGING — DX DG CHEST 1V PORT
1 series · 1 of 1 positions shown · non-contrast
Comparison: None Available.

CLINICAL DATA: Encounter for fever.

EXAM:
PORTABLE CHEST 1 VIEW

[chest ap]
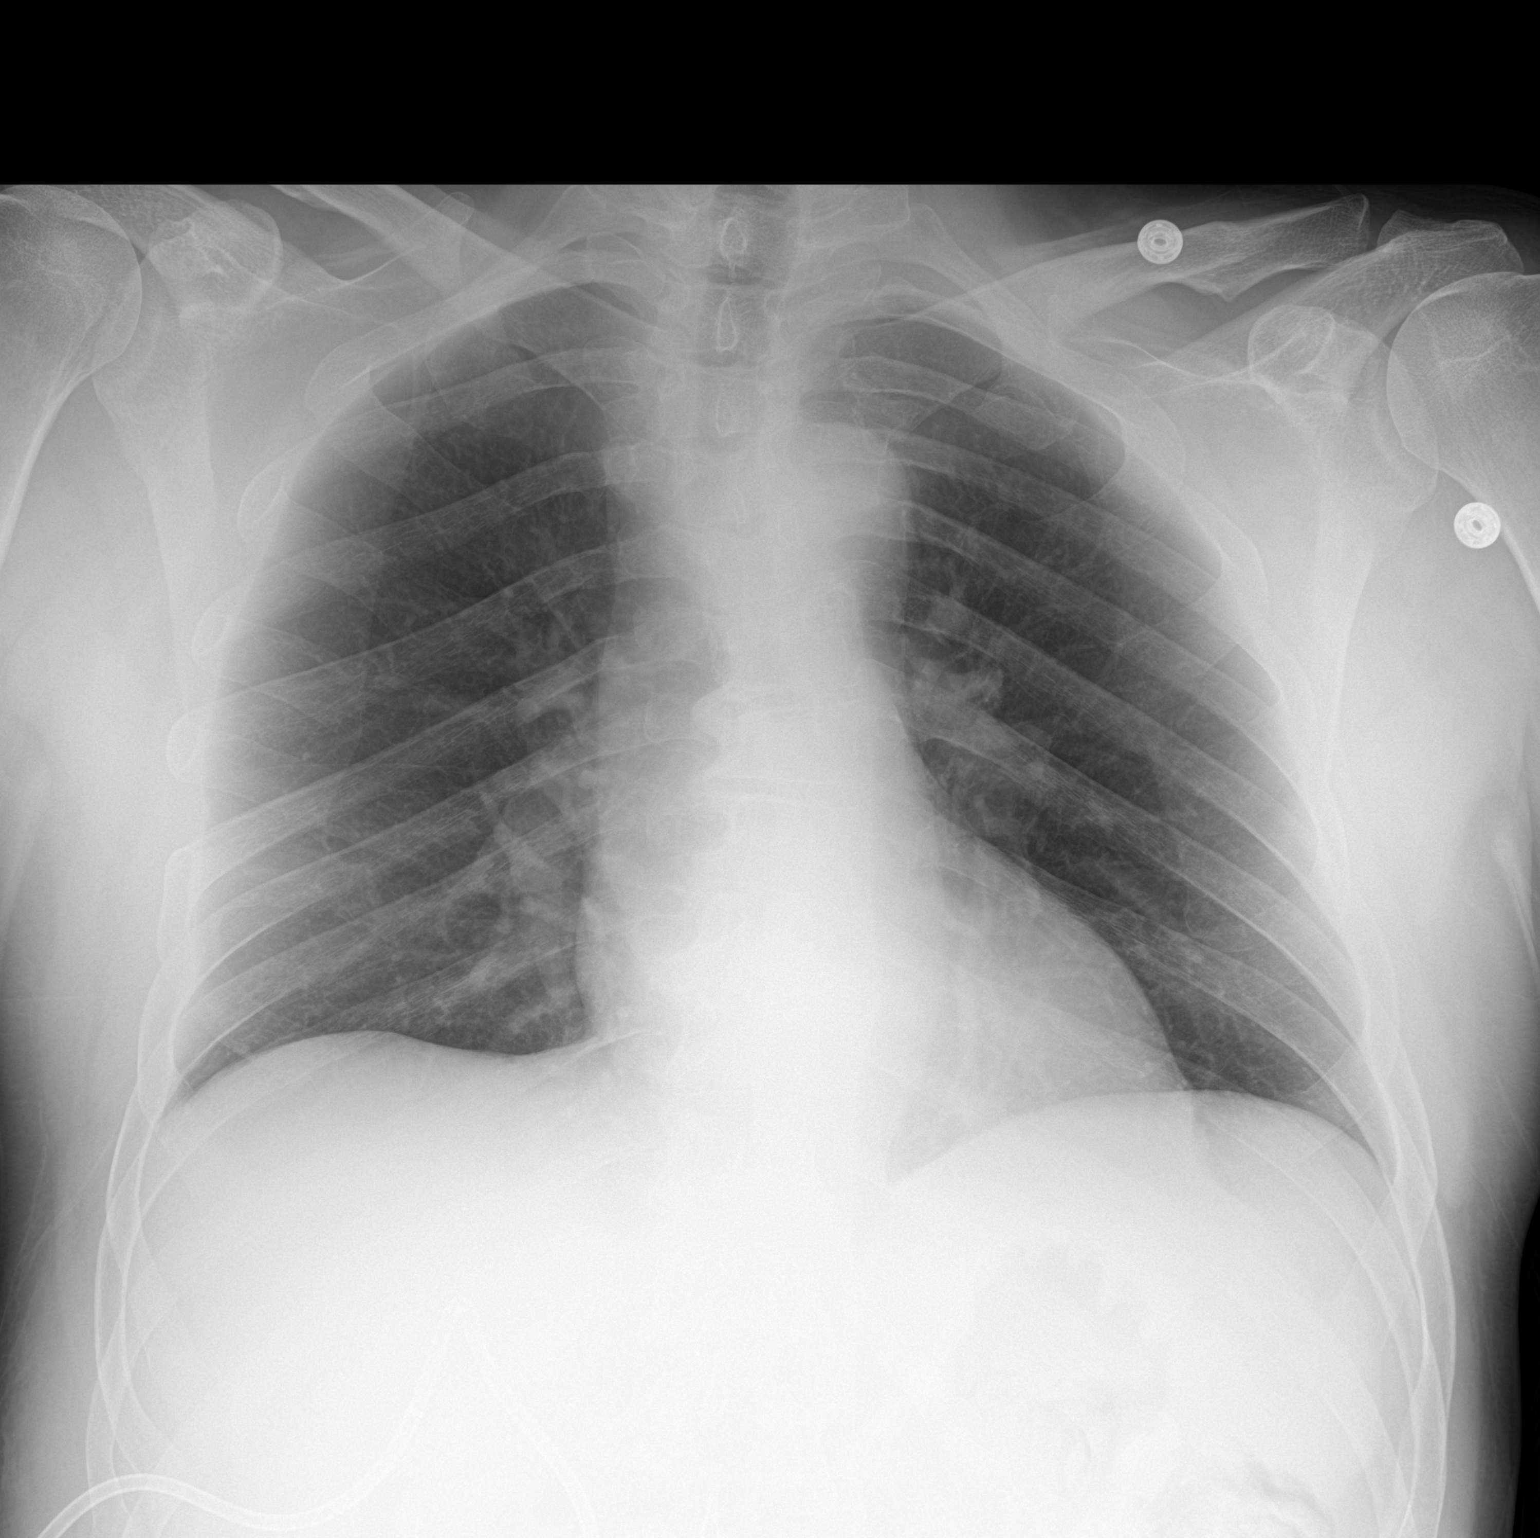

[1 of 1 positions shown; findings below may reference images not displayed]

FINDINGS: Cardiac silhouette and mediastinal contours are within normal
limits. The lungs are clear. No pleural effusion or pneumothorax.
Moderate multilevel degenerative bridging osteophytes of the
thoracic spine.
IMPRESSION: No acute cardiopulmonary disease process.

## 2024-07-17 ENCOUNTER — Ambulatory Visit: Admitting: Urology
